# Patient Record
Sex: Male | Born: 1945 | Race: Asian | Hispanic: No | Marital: Married | Smoking: Never smoker
Health system: Southern US, Community
[De-identification: ages and names within clinical notes are randomized; demographics above are authoritative.]

## PROBLEM LIST (undated history)

## (undated) DIAGNOSIS — I669 Occlusion and stenosis of unspecified cerebral artery: Secondary | ICD-10-CM

## (undated) DIAGNOSIS — I639 Cerebral infarction, unspecified: Secondary | ICD-10-CM

## (undated) HISTORY — DX: Occlusion and stenosis of unspecified cerebral artery: I66.9

---

## 1978-04-09 HISTORY — PX: PROSTATECTOMY: SHX69

## 2007-04-10 DIAGNOSIS — I669 Occlusion and stenosis of unspecified cerebral artery: Secondary | ICD-10-CM

## 2007-04-10 HISTORY — DX: Occlusion and stenosis of unspecified cerebral artery: I66.9

## 2015-02-09 ENCOUNTER — Ambulatory Visit: Payer: Self-pay | Admitting: Family Medicine

## 2015-02-21 ENCOUNTER — Ambulatory Visit (INDEPENDENT_AMBULATORY_CARE_PROVIDER_SITE_OTHER): Payer: Self-pay | Admitting: Family Medicine

## 2015-02-21 ENCOUNTER — Encounter: Payer: Self-pay | Admitting: Family Medicine

## 2015-02-21 VITALS — BP 120/80 | HR 73 | Temp 97.7°F | Resp 16 | Ht 70.0 in | Wt 160.0 lb

## 2015-02-21 DIAGNOSIS — Z23 Encounter for immunization: Secondary | ICD-10-CM

## 2015-02-21 DIAGNOSIS — Z7189 Other specified counseling: Secondary | ICD-10-CM

## 2015-02-21 DIAGNOSIS — Z7689 Persons encountering health services in other specified circumstances: Secondary | ICD-10-CM

## 2015-02-21 DIAGNOSIS — Z0000 Encounter for general adult medical examination without abnormal findings: Secondary | ICD-10-CM

## 2015-02-21 DIAGNOSIS — I6330 Cerebral infarction due to thrombosis of unspecified cerebral artery: Secondary | ICD-10-CM

## 2015-02-21 LAB — CBC WITH DIFFERENTIAL/PLATELET
Basophils Absolute: 0 10*3/uL (ref 0.0–0.1)
Basophils Relative: 0 % (ref 0–1)
Eosinophils Absolute: 0.2 10*3/uL (ref 0.0–0.7)
Eosinophils Relative: 3 % (ref 0–5)
HCT: 41.7 % (ref 39.0–52.0)
Hemoglobin: 13.6 g/dL (ref 13.0–17.0)
Lymphocytes Relative: 34 % (ref 12–46)
Lymphs Abs: 2.1 10*3/uL (ref 0.7–4.0)
MCH: 26.7 pg (ref 26.0–34.0)
MCHC: 32.6 g/dL (ref 30.0–36.0)
MCV: 81.9 fL (ref 78.0–100.0)
MPV: 9.2 fL (ref 8.6–12.4)
Monocytes Absolute: 0.5 10*3/uL (ref 0.1–1.0)
Monocytes Relative: 8 % (ref 3–12)
Neutro Abs: 3.4 10*3/uL (ref 1.7–7.7)
Neutrophils Relative %: 55 % (ref 43–77)
Platelets: 269 10*3/uL (ref 150–400)
RBC: 5.09 MIL/uL (ref 4.22–5.81)
RDW: 14 % (ref 11.5–15.5)
WBC: 6.1 10*3/uL (ref 4.0–10.5)

## 2015-02-21 LAB — COMPLETE METABOLIC PANEL WITH GFR
ALT: 10 U/L (ref 9–46)
AST: 15 U/L (ref 10–35)
Albumin: 4.2 g/dL (ref 3.6–5.1)
Alkaline Phosphatase: 56 U/L (ref 40–115)
BUN: 10 mg/dL (ref 7–25)
CO2: 25 mmol/L (ref 20–31)
Calcium: 9.5 mg/dL (ref 8.6–10.3)
Chloride: 100 mmol/L (ref 98–110)
Creat: 0.93 mg/dL (ref 0.70–1.25)
GFR, Est African American: >89 mL/min (ref >=60)
GFR, Est Non African American: 83 mL/min (ref >=60)
Glucose, Bld: 85 mg/dL (ref 65–99)
Potassium: 4.8 mmol/L (ref 3.5–5.3)
Sodium: 135 mmol/L (ref 135–146)
Total Bilirubin: 0.5 mg/dL (ref 0.2–1.2)
Total Protein: 7.4 g/dL (ref 6.1–8.1)

## 2015-02-21 LAB — LIPID PANEL
Cholesterol: 97 mg/dL — ABNORMAL LOW (ref 125–200)
HDL: 42 mg/dL (ref >=40)
LDL Cholesterol: 37 mg/dL (ref <130)
Total CHOL/HDL Ratio: 2.3 Ratio (ref <=5.0)
Triglycerides: 92 mg/dL (ref <150)
VLDL: 18 mg/dL (ref <30)

## 2015-02-21 LAB — TSH: TSH: 1.638 u[IU]/mL (ref 0.350–4.500)

## 2015-02-21 NOTE — Progress Notes (Signed)
Patient ID: Christopher Murphy, male   DOB: 1945-10-09, 69 y.o.   MRN: [id number]   Christopher Murphy, is a 69 y.o. male  [id number]  [id number]  DOB - Jun 12, 1945  CC:  Chief Complaint  Patient presents with  . Establish Care       HPI: Christopher Murphy is a 69 y.o. male here to establish care. He has recently moved here from [place]. 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 He is currently on ASA 81 and Plavik 75 mg daily. He has no residual effects from the CVA according to the daughter. He is on namenda for memory, which his daughter relates as being related to the CVA. He is on Crestor but unsure if he has a history of hyperlipidemia. He also takes OTC folic acid. He does not need refills of any medications today, as he brought a good supply from [place]. He denies tobacco, alcohol or illicit drug use. His daughter says he walks a lot without difficulty. It is unclear about the quality of his diet. He has no history of hypertension, diabetes or cancer. He is in need for influenza, Tdap, pneumonia and colon cancer screening.   No Known Allergies Past Medical History  Diagnosis Date  . Blood clots in brain 2009   No current outpatient prescriptions on file prior to visit.   No current facility-administered medications on file prior to visit.   Family History  Problem Relation Age of Onset  . Family history unknown: Yes   Social History   Social History  . Marital Status: Married    Spouse Name: N/A  . Number of Children: N/A  . Years of Education: N/A   Occupational History  . Not on file.   Social History Main Topics  . Smoking status: Never Smoker   . Smokeless tobacco: Never Used  . Alcohol Use: No  . Drug Use: No  . Sexual Activity: Not on file   Other Topics Concern  . Not on file   Social History Narrative  . No narrative on file    Review of Systems: Constitutional: Negative for fever, chills, appetite change, weight loss,   Fatigue. Skin: Negative for rashes or lesions of concern. HENT: Negative for ear pain, ear discharge.nose bleeds Eyes: Negative for pain, discharge, redness, itching and visual disturbance. Neck: Negative for pain, stiffness Respiratory: Negative for cough, shortness of breath,   Cardiovascular: Negative for chest pain, palpitations and leg swelling. Gastrointestinal: Negative for abdominal pain, nausea, vomiting, diarrhea, constipations Genitourinary: Negative for dysuria, urgency, frequency, hematuria,  Musculoskeletal: Negative for back pain, joint pain, joint  swelling, and gait problem.Negative for weakness. Neurological: Negative for dizziness, tremors, seizures, syncope,   light-headedness, numbness and headaches.  Hematological: Negative for easy bruising or bleeding Psychiatric/Behavioral: Negative for depression, anxiety, decreased concentration, confusion. 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 No distress. HENT: Normocephalic, atraumatic, External right and left ear normal. Oropharynx is clear and moist.  Eyes: Conjunctivae and EOM are normal. PERRLA, no scleral icterus. Neck: Normal ROM. Neck supple. No lymphadenopathy, No thyromegaly. CVS: RRR, S1/S2 +, no murmurs, no gallops, no rubs Pulmonary: Effort and breath sounds normal, no stridor, rhonchi, wheezes, rales.  Abdominal: Soft. Normoactive BS,, no distension, tenderness, rebound or guarding.  Musculoskeletal: Normal range of motion. No edema and no tenderness.  Neuro: Alert.Normal muscle tone coordination. Non-focal. PERL, EOMS intact.moves all extrementies exhibiting equal strength. Very slight instability of gait. Gets on and off exam table without help.  Skin: Skin is warm and dry. No rash noted. Not diaphoretic. No  erythema. No pallor. Psychiatric: Normal mood and affect. Behavior, judgment, thought content normal.  No results found for: WBC, HGB, HCT, MCV, PLT No results found for: CREATININE, BUN, NA, K, CL, CO2  No results found for: HGBA1C Lipid Panel  No results found for: CHOL, TRIG, HDL, CHOLHDL, VLDL, LDLCALC     Assessment and plan:   1. Visit to establish care -I have reviewed information provided by the patient with the help of his daughter. -There is a minor language barrier, even with help of daughter but she expresses good understanding.  2.Health maintenance -CMet with GFR -CBC -Lipid Panel -Vitamin D -PSA -HIV -Will call with results if action needed.  3. Need for immunizations - Tdap -Prevnar -Influenza   Return in about 6 months (around 08/21/2015).  The patient was given clear instructions to go to ER or return to medical center if symptoms don't improve, worsen or new problems develop. The patient verbalized understanding.    Micheline Chapman FNP  02/22/2015, 10:56 AM

## 2015-02-21 NOTE — Patient Instructions (Signed)
Continue to take current medications as prescribed. Eat a diet that is low in fats and cholesterol and carbohydrates  We will call if there is anything in bloodwork that needs attention.

## 2015-02-22 LAB — HIV ANTIBODY (ROUTINE TESTING W REFLEX): HIV 1&2 Ab, 4th Generation: NONREACTIVE

## 2015-02-22 LAB — PSA: PSA: 1.64 ng/mL (ref <=4.00)

## 2015-02-23 LAB — VITAMIN D 1,25 DIHYDROXY
Vitamin D 1, 25 (OH)2 Total: 45 pg/mL (ref 18–72)
Vitamin D2 1, 25 (OH)2: <8 pg/mL
Vitamin D3 1, 25 (OH)2: 45 pg/mL

## 2015-08-22 ENCOUNTER — Ambulatory Visit: Payer: Self-pay | Admitting: Family Medicine

## 2016-04-05 ENCOUNTER — Emergency Department: Payer: BLUE CROSS/BLUE SHIELD

## 2016-04-05 ENCOUNTER — Observation Stay
Admission: EM | Admit: 2016-04-05 | Discharge: 2016-04-06 | Disposition: A | Discharge status: Home / Self Care | Payer: BLUE CROSS/BLUE SHIELD | Attending: Internal Medicine | Admitting: Internal Medicine

## 2016-04-05 ENCOUNTER — Encounter: Payer: Self-pay | Admitting: Emergency Medicine

## 2016-04-05 DIAGNOSIS — Z86718 Personal history of other venous thrombosis and embolism: Secondary | ICD-10-CM | POA: Insufficient documentation

## 2016-04-05 DIAGNOSIS — Z7902 Long term (current) use of antithrombotics/antiplatelets: Secondary | ICD-10-CM | POA: Insufficient documentation

## 2016-04-05 DIAGNOSIS — I639 Cerebral infarction, unspecified: Secondary | ICD-10-CM | POA: Diagnosis not present

## 2016-04-05 DIAGNOSIS — G459 Transient cerebral ischemic attack, unspecified: Principal | ICD-10-CM | POA: Insufficient documentation

## 2016-04-05 DIAGNOSIS — R000 Tachycardia, unspecified: Secondary | ICD-10-CM | POA: Insufficient documentation

## 2016-04-05 DIAGNOSIS — Z7982 Long term (current) use of aspirin: Secondary | ICD-10-CM | POA: Insufficient documentation

## 2016-04-05 DIAGNOSIS — R531 Weakness: Secondary | ICD-10-CM

## 2016-04-05 HISTORY — DX: Cerebral infarction, unspecified: I63.9

## 2016-04-05 LAB — DIFFERENTIAL
Basophils Absolute: 0.3 10*3/uL — ABNORMAL HIGH (ref 0–0.1)
Basophils Relative: 2 %
Eosinophils Absolute: 0.2 10*3/uL (ref 0–0.7)
Eosinophils Relative: 1 %
Lymphocytes Relative: 5 %
Lymphs Abs: 0.8 10*3/uL — ABNORMAL LOW (ref 1.0–3.6)
Monocytes Absolute: 0.8 10*3/uL (ref 0.2–1.0)
Monocytes Relative: 5 %
Neutro Abs: 13.8 10*3/uL — ABNORMAL HIGH (ref 1.4–6.5)
Neutrophils Relative %: 87 %

## 2016-04-05 LAB — COMPREHENSIVE METABOLIC PANEL
ALT: 13 U/L — ABNORMAL LOW (ref 17–63)
AST: 22 U/L (ref 15–41)
Albumin: 4 g/dL (ref 3.5–5.0)
Alkaline Phosphatase: 53 U/L (ref 38–126)
Anion gap: 9 (ref 5–15)
BUN: 14 mg/dL (ref 6–20)
CO2: 22 mmol/L (ref 22–32)
Calcium: 9 mg/dL (ref 8.9–10.3)
Chloride: 102 mmol/L (ref 101–111)
Creatinine, Ser: 1.02 mg/dL (ref 0.61–1.24)
GFR calc Af Amer: >60 mL/min (ref >60)
GFR calc non Af Amer: >60 mL/min (ref >60)
Glucose, Bld: 101 mg/dL — ABNORMAL HIGH (ref 65–99)
Potassium: 4.2 mmol/L (ref 3.5–5.1)
Sodium: 133 mmol/L — ABNORMAL LOW (ref 135–145)
Total Bilirubin: 0.4 mg/dL (ref 0.3–1.2)
Total Protein: 7.7 g/dL (ref 6.5–8.1)

## 2016-04-05 LAB — CBC
HCT: 38.1 % — ABNORMAL LOW (ref 40.0–52.0)
Hemoglobin: 13.2 g/dL (ref 13.0–18.0)
MCH: 27.2 pg (ref 26.0–34.0)
MCHC: 34.5 g/dL (ref 32.0–36.0)
MCV: 78.8 fL — ABNORMAL LOW (ref 80.0–100.0)
Platelets: 289 10*3/uL (ref 150–440)
RBC: 4.84 MIL/uL (ref 4.40–5.90)
RDW: 13.4 % (ref 11.5–14.5)
WBC: 15.9 10*3/uL — ABNORMAL HIGH (ref 3.8–10.6)

## 2016-04-05 LAB — TROPONIN I: Troponin I: 0.03 ng/mL (ref <0.03)

## 2016-04-05 LAB — GLUCOSE, CAPILLARY: Glucose-Capillary: 101 mg/dL — ABNORMAL HIGH (ref 65–99)

## 2016-04-05 LAB — PROTIME-INR
INR: 1.06
Prothrombin Time: 13.8 seconds (ref 11.4–15.2)

## 2016-04-05 LAB — APTT: aPTT: 31 seconds (ref 24–36)

## 2016-04-05 MED ORDER — ONDANSETRON HCL 4 MG PO TABS: 4.0000 mg | ORAL_TABLET | Freq: Four times a day (QID) | ORAL | Status: DC | PRN

## 2016-04-05 MED ORDER — ACETAMINOPHEN 650 MG RE SUPP: 650.0000 mg | Freq: Four times a day (QID) | RECTAL | Status: DC | PRN

## 2016-04-05 MED ORDER — MEMANTINE HCL 5 MG PO TABS
5.0000 mg | ORAL_TABLET | Freq: Two times a day (BID) | ORAL | Status: DC
  Administered 2016-04-05 – 2016-04-06 (×2): 5 mg via ORAL
  Filled 2016-04-05 (×3): qty 1

## 2016-04-05 MED ORDER — SODIUM CHLORIDE 0.9% FLUSH
3.0000 mL | Freq: Two times a day (BID) | INTRAVENOUS | Status: DC
  Administered 2016-04-05 – 2016-04-06 (×2): 3 mL via INTRAVENOUS

## 2016-04-05 MED ORDER — ENOXAPARIN SODIUM 40 MG/0.4ML ~~LOC~~ SOLN
40.0000 mg | SUBCUTANEOUS | Status: DC
  Administered 2016-04-05: 40 mg via SUBCUTANEOUS
  Filled 2016-04-05: qty 0.4

## 2016-04-05 MED ORDER — SODIUM CHLORIDE 0.9 % IV BOLUS (SEPSIS)
1000.0000 mL | Freq: Once | INTRAVENOUS | Status: AC
  Administered 2016-04-05: 1000 mL via INTRAVENOUS

## 2016-04-05 MED ORDER — POLYETHYLENE GLYCOL 3350 17 G PO PACK: 17.0000 g | PACK | Freq: Every day | ORAL | Status: DC | PRN

## 2016-04-05 MED ORDER — ACETAMINOPHEN 325 MG PO TABS: 650.0000 mg | ORAL_TABLET | Freq: Four times a day (QID) | ORAL | Status: DC | PRN

## 2016-04-05 MED ORDER — ONDANSETRON HCL 4 MG/2ML IJ SOLN: 4.0000 mg | Freq: Four times a day (QID) | INTRAMUSCULAR | Status: DC | PRN

## 2016-04-05 MED ORDER — SODIUM CHLORIDE 0.9% FLUSH
3.0000 mL | Freq: Two times a day (BID) | INTRAVENOUS | Status: DC
  Administered 2016-04-05 – 2016-04-06 (×2): 3 mL via INTRAVENOUS

## 2016-04-05 MED ORDER — CLOPIDOGREL BISULFATE 75 MG PO TABS
75.0000 mg | ORAL_TABLET | Freq: Every day | ORAL | Status: DC
  Administered 2016-04-06: 75 mg via ORAL
  Filled 2016-04-05: qty 1

## 2016-04-05 MED ORDER — SODIUM CHLORIDE 0.9% FLUSH: 3.0000 mL | INTRAVENOUS | Status: DC | PRN

## 2016-04-05 MED ORDER — IOPAMIDOL (ISOVUE-370) INJECTION 76%
75.0000 mL | Freq: Once | INTRAVENOUS | Status: AC | PRN
  Administered 2016-04-05: 75 mL via INTRAVENOUS

## 2016-04-05 MED ORDER — ASPIRIN EC 81 MG PO TBEC
81.0000 mg | DELAYED_RELEASE_TABLET | Freq: Every day | ORAL | Status: DC
  Administered 2016-04-06: 11:00:00 81 mg via ORAL
  Filled 2016-04-05: qty 1

## 2016-04-05 MED ORDER — SODIUM CHLORIDE 0.9 % IV SOLN: 250.0000 mL | INTRAVENOUS | Status: DC | PRN

## 2016-04-05 MED ORDER — INFLUENZA VAC SPLIT QUAD 0.5 ML IM SUSY
0.5000 mL | PREFILLED_SYRINGE | INTRAMUSCULAR | Status: AC
  Administered 2016-04-06: 11:00:00 0.5 mL via INTRAMUSCULAR
  Filled 2016-04-05: qty 0.5

## 2016-04-05 MED ORDER — ROSUVASTATIN CALCIUM 20 MG PO TABS
20.0000 mg | ORAL_TABLET | Freq: Every day | ORAL | Status: DC
  Administered 2016-04-05: 19:00:00 20 mg via ORAL
  Filled 2016-04-05: qty 1

## 2016-04-05 NOTE — H&P (Addendum)
[facility] NAME: Christopher Murphy    MR#:  [id number]  DATE OF BIRTH:  1945/10/21  DATE OF ADMISSION:  04/05/2016  PRIMARY CARE PHYSICIAN: No PCP Per Patient   REQUESTING/REFERRING PHYSICIAN: Dr. Darl Householder  CHIEF COMPLAINT:   Chief Complaint  Patient presents with  . Code Stroke    HISTORY OF PRESENT ILLNESS:  Christopher Murphy  is a 70 y.o. 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 Patient does not remember these episodes and tells me he is here for a general checkup. Presently patient is back to baseline. Symptoms started with patient having shaking all over without incontinence or tongue biting. He was then noticed to have right-sided weakness and speaking gibberish. By the time patient was brought to the coronary family had to drag him in due to the significant right-sided weakness. Code stroke was called. Consideration for TPA was given but symptoms improved quickly and now patient has no further weakness. CT scan of the head showed nothing acute. CTA showed chronic left PCA occlusion with no other significant findings. Patient is being admitted for TIA. H/o cataract surgery.  PAST MEDICAL HISTORY:   Past Medical History:  Diagnosis Date  . Blood clots in brain 2009  . Stroke [facility])     PAST SURGICAL HISTORY:  No past surgical history on file.  SOCIAL HISTORY:   Social History  Substance Use Topics  . Smoking status: Never Smoker  . Smokeless tobacco: Never Used  . Alcohol use No    FAMILY HISTORY:   Family History  Problem Relation Age of Onset  . Family history unknown: Yes    DRUG ALLERGIES:  No Known Allergies  REVIEW OF SYSTEMS:   Review of Systems  Constitutional: Negative for chills and fever.  HENT: Negative for sore throat.   Eyes:  Negative for blurred vision, double vision and pain.  Respiratory: Negative for cough, hemoptysis, shortness of breath and wheezing.   Cardiovascular: Negative for chest pain, palpitations, orthopnea and leg swelling.  Gastrointestinal: Negative for abdominal pain, constipation, diarrhea, heartburn, nausea and vomiting.  Genitourinary: Negative for dysuria and hematuria.  Musculoskeletal: Negative for back pain and joint pain.  Skin: Negative for rash.  Neurological: Negative for sensory change, speech change, focal weakness and headaches.  Endo/Heme/Allergies: Does not bruise/bleed easily.  Psychiatric/Behavioral: Negative for depression. The patient is not nervous/anxious.    MEDICATIONS AT HOME:   Prior to Admission medications   Medication Sig Start Date End Date Taking? Authorizing Provider  clopidogrel (PLAVIX) 75 MG tablet Take 75 mg by mouth daily.   Yes Historical Provider, MD  Folic Acid 5 MG CAPS Take 5 mg by mouth daily.    Yes Historical Provider, MD  memantine (NAMENDA) 5 MG tablet Take 5 mg by mouth 2 (two) times daily.   Yes Historical Provider, MD  rosuvastatin (CRESTOR) 20 MG tablet Take 20 mg by mouth daily.   Yes Historical Provider, MD   VITAL SIGNS:  Blood pressure 103/64, pulse 96, temperature 99.6 F (37.6 C), temperature source Oral, resp. rate (!) 22, SpO2 98 %.  PHYSICAL EXAMINATION:  Physical Exam  GENERAL:  70 y.o.-year-old patient lying in the bed with no acute distress.  EYES: Pupils unequal. Right 1 mm and left irregular and 4 mm. Extraocular muscles intact.  HEENT: Head atraumatic, normocephalic. Oropharynx and nasopharynx clear. No oropharyngeal erythema, moist oral mucosa  NECK:  Supple, no jugular venous distention. No thyroid enlargement, no tenderness.  LUNGS: Normal breath sounds bilaterally, no wheezing, rales, rhonchi. No use of accessory muscles of respiration.  CARDIOVASCULAR: S1, S2 normal. No murmurs, rubs, or gallops.  ABDOMEN: Soft,  nontender, nondistended. Bowel sounds present. No organomegaly or mass.  EXTREMITIES: No pedal edema, cyanosis, or clubbing. + 2 pedal & radial pulses b/l.   NEUROLOGIC: Cranial nerves II through XII are intact. No focal Motor or sensory deficits appreciated b/l PSYCHIATRIC: The patient is alert and oriented x 3. Good affect.  SKIN: No obvious rash, lesion, or ulcer.   LABORATORY PANEL:   CBC  Recent Labs Lab 04/05/16 1501  WBC 15.9*  HGB 13.2  HCT 38.1*  PLT 289   ------------------------------------------------------------------------------------------------------------------  Chemistries   Recent Labs Lab 04/05/16 1501  NA 133*  K 4.2  CL 102  CO2 22  GLUCOSE 101*  BUN 14  CREATININE 1.02  CALCIUM 9.0  AST 22  ALT 13*  ALKPHOS 53  BILITOT 0.4   ------------------------------------------------------------------------------------------------------------------  Cardiac Enzymes  Recent Labs Lab 04/05/16 1501  TROPONINI 0.03*   ------------------------------------------------------------------------------------------------------------------  RADIOLOGY:  Ct Angio Head W Or Wo Contrast  Result Date: 04/05/2016 CLINICAL DATA:  Code stroke. RIGHT-sided weakness. Uncontrollable body movements. EXAM: CT ANGIOGRAPHY HEAD AND NECK TECHNIQUE: Multidetector CT imaging of the head and neck was performed using the standard protocol during bolus administration of intravenous contrast. Multiplanar CT image reconstructions and MIPs were obtained to evaluate the vascular anatomy. Carotid stenosis measurements (when applicable) are obtained utilizing NASCET criteria, using the distal internal carotid diameter as the denominator. CONTRAST:  Isovue 370, 75 mL. COMPARISON:  Code stroke CT head at 1456 hours. FINDINGS: CT HEAD Please see the code stroke head CT report performed 30 minutes earlier. CTA NECK Aortic arch: Standard branching. Imaged portion shows no evidence of aneurysm or  dissection. No significant stenosis of the major arch vessel origins. Right carotid system: Unremarkable. No evidence of dissection, stenosis (50% or greater) or occlusion. Left carotid system: Unremarkable. No evidence of dissection, stenosis (50% or greater) or occlusion. Vertebral arteries: LEFT vertebral slightly larger, both patent. No evidence of dissection, stenosis (50% or greater) or occlusion. Nonvascular soft tissues: No neck masses or significant spondylosis. No lung apex lesion CTA HEAD Anterior circulation: Dolichoectasia. Minor cavernous atheromatous change on the LEFT. No significant stenosis, proximal occlusion, aneurysm, or vascular malformation. Posterior circulation: Dolichoectasia. LEFT vertebral dominant. 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 No significant stenosis, acute proximal occlusion, aneurysm, or vascular malformation. Venous sinuses: As permitted by contrast timing, patent. Anatomic variants: None of significance. Delayed phase:   No abnormal intracranial enhancement. IMPRESSION: No extracranial stenosis of significance. No acute intracranial vascular occlusion or abnormal postcontrast enhancement. Chronically occluded LEFT PCA. Electronically Signed   By: Staci Righter M.D.   On: 04/05/2016 16:07   Ct Angio Neck W Or Wo Contrast  Result Date: 04/05/2016 CLINICAL DATA:  Code stroke. RIGHT-sided weakness. Uncontrollable body movements. EXAM: CT ANGIOGRAPHY HEAD AND NECK TECHNIQUE: Multidetector CT imaging of the head and neck was performed using the standard protocol during bolus administration of intravenous contrast. Multiplanar CT image reconstructions and MIPs were obtained to evaluate the vascular anatomy. Carotid stenosis measurements (when applicable) are obtained utilizing NASCET criteria, using the distal internal carotid diameter as the denominator. CONTRAST:  Isovue 370, 75 mL. COMPARISON:  Code stroke CT head at 1456  hours. FINDINGS: CT HEAD Please see the code stroke head CT report performed 30 minutes earlier. CTA NECK Aortic arch: Standard branching. Imaged portion shows no evidence of aneurysm or dissection. No significant stenosis of the major arch vessel origins. Right carotid system: Unremarkable. No evidence of dissection, stenosis (50% or greater) or occlusion. Left carotid system: Unremarkable. No evidence of dissection, stenosis (50% or greater) or occlusion. Vertebral arteries: LEFT vertebral slightly larger, both patent. No evidence of dissection, stenosis (50% or greater) or occlusion. Nonvascular soft tissues: No neck masses or significant spondylosis. No lung apex lesion CTA HEAD Anterior circulation: Dolichoectasia. Minor cavernous atheromatous change on the LEFT. No significant stenosis, proximal occlusion, aneurysm, or vascular malformation. Posterior circulation: Dolichoectasia. LEFT vertebral dominant. 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 No significant stenosis, acute proximal occlusion, aneurysm, or vascular malformation. Venous sinuses: As permitted by contrast timing, patent. Anatomic variants: None of significance. Delayed phase:   No abnormal intracranial enhancement. IMPRESSION: No extracranial stenosis of significance. No acute intracranial vascular occlusion or abnormal postcontrast enhancement. Chronically occluded LEFT PCA. Electronically Signed   By: Staci Righter M.D.   On: 04/05/2016 16:07   Dg Chest Portable 1 View  Result Date: 04/05/2016 CLINICAL DATA:  Right-sided weakness, confusion. EXAM: PORTABLE CHEST 1 VIEW COMPARISON:  None. FINDINGS: The heart size and mediastinal contours are within normal limits. Both lungs are clear. No pneumothorax or pleural effusion is noted. The visualized skeletal structures are unremarkable. IMPRESSION: No acute cardiopulmonary abnormality seen. Electronically Signed   By: Marijo Conception, M.D.   On:  04/05/2016 16:11   Ct Head Code Stroke W/o Cm  Addendum Date: 04/05/2016   ADDENDUM REPORT: 04/05/2016 15:37 ADDENDUM: Study discussed by telephone With Dr. Alexis Goodell on 04/05/2016 at 1518 hours. Electronically Signed   By: Genevie Ann M.D.   On: 04/05/2016 15:37   Result Date: 04/05/2016 CLINICAL DATA:  Code stroke. 70 year old male with right side weakness. Uncontrollable movements and jerking. Initial encounter. EXAM: CT HEAD WITHOUT CONTRAST TECHNIQUE: Contiguous axial images were obtained from the base of the skull through the vertex without intravenous contrast. COMPARISON:  None. FINDINGS: Brain: Confluent abnormal bilateral cerebral white matter hypodensity. Superimposed chronic infarcts of the left thalamus and left occipital lobe. Wallerian degeneration evident at the left cerebral peduncle and midbrain (series 2, image 14). No other cortical encephalomalacia identified. The remaining brainstem and cerebellum appear within normal limits. No midline shift, ventriculomegaly, mass effect, evidence of mass lesion, intracranial hemorrhage or evidence of cortically based acute infarction. Vascular: Mild Calcified atherosclerosis at the skull base. Generalized intracranial artery dolichoectasia. No suspicious intracranial vascular hyperdensity. Skull: Negative.  No acute osseous abnormality identified. Sinuses/Orbits: Mild bilateral ethmoid and maxillary sinus mucosal thickening. Other visible sinuses, the tympanic cavities and mastoids are well pneumatized. Other: No acute orbit or scalp soft tissue findings. Postoperative changes to the left globe. ASPECTS [facility] Stroke Program Early CT Score) - Ganglionic level infarction (caudate, lentiform nuclei, internal capsule, insula, M1-M3 cortex): 7 - Supraganglionic infarction (M4-M6 cortex): 3 Total score (0-10 with 10 being normal): 10 IMPRESSION: 1. No acute cortically based infarct or acute intracranial hemorrhage identified. 2. ASPECTS is 10. 3.  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 Left midbrain Wallerian degeneration. Confluent bilateral cerebral white matter hypodensity likely due to superimposed chronic small vessel disease. 4. 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 Electronically Signed:  By: Genevie Ann M.D. On: 04/05/2016 15:11     IMPRESSION AND PLAN:   * TIA -Check MRI of the brain, Carotid dopplers, Echo - Start aspirin and statin. - Lovenox for DVT prophylaxis. - PT/OT/Speech not needed as per symptoms - Neuro checks every 4 hours for 24 hours. - Consult neurology.  All the records are reviewed and case discussed with ED provider. Management plans discussed with the patient, family and they are in agreement.  CODE STATUS: FULL CODE  TOTAL TIME TAKING CARE OF THIS PATIENT: 40 minutes.   Hillary Bow R M.D on 04/05/2016 at 4:54 PM  Between 7am to 6pm - Pager - [phone]  After 6pm go to [email or web address] - password [email or web address] [facility]  Office  [phone]  CC: Primary care physician; No PCP Per Patient  Note: This dictation was prepared with Dragon dictation along with smaller phrase technology. Any transcriptional errors that result from this process are unintentional.

## 2016-04-05 NOTE — ED Notes (Signed)
Dr Doy Mince in with pt and family.

## 2016-04-05 NOTE — ED Notes (Signed)
No treatment at this time, pt is improving, keep in treatment window until 1600, VS Q15, neuro Q30, then Q2h.  Primary nurse Mateo Flow, RN updated.

## 2016-04-05 NOTE — ED Notes (Signed)
Pt alert.  Iv fluids infsued.  Family with pt.  nsr on monitor.

## 2016-04-05 NOTE — ED Notes (Signed)
Dr reynolds at bedside.  

## 2016-04-05 NOTE — ED Notes (Signed)
Lab called with troponin of 0.03   Dr Darl Householder aware.

## 2016-04-05 NOTE — ED Notes (Signed)
Report  Called to floor nurse mattie rn

## 2016-04-05 NOTE — Progress Notes (Signed)
Upon assessment, pt's left pupil is about 4 mm, non reactive, and uneven. Pt's right pupil is about 20mm, non reactive and uneven. MD paged, stated he was aware. Ammie Dalton, RN

## 2016-04-05 NOTE — ED Notes (Signed)
Patient transported to CT 

## 2016-04-05 NOTE — Progress Notes (Signed)
Chaplain received and order to visit with pt in room 116. Provided information regarding an Scientist, physiological.    04/05/16 1815  Clinical Encounter Type  Visited With Patient;Patient and family together  Visit Type Initial  Referral From Nurse  Consult/Referral To Chaplain  Spiritual Encounters  Spiritual Needs Other (Comment)

## 2016-04-05 NOTE — ED Notes (Signed)
Son reports at 66 pt started shivering and then had difficulty speaking and right sided weakness. Has been confused.

## 2016-04-05 NOTE — Consult Note (Signed)
Referring Physician: Darl Householder    Chief Complaint: Difficulty with speech, right sided weakness  HPI: Christopher Murphy is an 70 y.o. male with a history of stroke who was with family today and expressed that he was feeling poorly.  They noted that he was unable to move his right side and that he was talking "jibberish".  Knows English as a second language but was even having difficulty with his first language.  Patient was brought in for evaluation at that time.  Patient has a history of a left PCA infarct about 8 years ago.  Was most recently followed up in [place].  On Plavix?    Date last known well: Date: 04/05/2016 Time last known well: Time: 11:30 tPA Given: No: Symptoms improving, unclear medical regimen.    Past Medical History:  Diagnosis Date  . Blood clots in brain 2009  . Stroke [facility])     No past surgical history on file.  Family History  Problem Relation Age of Onset  . Family history unknown: Yes   Social History:  reports that he has never smoked. He has never used smokeless tobacco. He reports that he does not drink alcohol or use drugs.  Allergies: No Known Allergies  Medications: I have reviewed the patient's current medications. Prior to Admission:  List of medications given but not in [place].  Felt to be on Plavix.    ROS: History obtained from caregiver  General ROS: negative for - chills, fatigue, fever, night sweats, weight gain or weight loss Psychological ROS: negative for - behavioral disorder, hallucinations, memory difficulties, mood swings or suicidal ideation Ophthalmic ROS: negative for - blurry vision, double vision, eye pain or loss of vision ENT ROS: negative for - epistaxis, nasal discharge, oral lesions, sore throat, tinnitus or vertigo Allergy and Immunology ROS: negative for - hives or itchy/watery eyes Hematological and Lymphatic ROS: negative for - bleeding problems, bruising or swollen lymph nodes Endocrine ROS: negative for -  galactorrhea, hair pattern changes, polydipsia/polyuria or temperature intolerance Respiratory ROS: negative for - cough, hemoptysis, shortness of breath or wheezing Cardiovascular ROS: negative for - chest pain, dyspnea on exertion, edema or irregular heartbeat Gastrointestinal ROS: negative for - abdominal pain, diarrhea, hematemesis, nausea/vomiting or stool incontinence Genito-Urinary ROS: negative for - dysuria, hematuria, incontinence or urinary frequency/urgency Musculoskeletal ROS: negative for - joint swelling or muscular weakness Neurological ROS: as noted in HPI Dermatological ROS: negative for rash and skin lesion changes  Physical Examination: Blood pressure 96/71, pulse (!) 112, temperature 99.6 F (37.6 C), temperature source Oral, resp. rate 17, SpO2 97 %.  HEENT-  Normocephalic, no lesions, without obvious abnormality.  Normal external eye and conjunctiva.  Normal TM's bilaterally.  Normal auditory canals and external ears. Normal external nose, mucus membranes and septum.  Normal pharynx. Cardiovascular- S1, S2 normal, pulses palpable throughout   Lungs- chest clear, no wheezing, rales, normal symmetric air entry Abdomen- soft, non-tender; bowel sounds normal; no masses,  no organomegaly Extremities- no edema Lymph-no adenopathy palpable Musculoskeletal-no joint tenderness, deformity or swelling Skin-warm and dry, no hyperpigmentation, vitiligo, or suspicious lesions  Neurological Examination Mental Status: Alert.  Reports that it is August and that he is 32.  Is able to follow commands-speaking some English and some of his native language.   Cranial Nerves: II: Discs flat bilaterally; Visual fields grossly normal, pupils equal, round, reactive to light and accommodation III,IV, VI: ptosis not present, extra-ocular motions intact bilaterally V,VII: smile symmetric, facial light touch sensation normal bilaterally  VIII: hearing normal bilaterally IX,X: gag reflex  present XI: bilateral shoulder shrug XII: midline tongue extension Motor: Right : Upper extremity   5/5    Left:     Upper extremity   5/5  Lower extremity   5-/5     Lower extremity   5-/5 Tone and bulk:normal tone throughout; no atrophy noted Sensory: Pinprick and light touch intact throughout, bilaterally Deep Tendon Reflexes: 2+ and symmetric throughout Plantars: Right: downgoing   Left: downgoing Cerebellar: Normal finger-to-nose and normal heel-to-shin testing bilaterally Gait: not tested due to safety concerns    Laboratory Studies:  Basic Metabolic Panel: No results for input(s): NA, K, CL, CO2, GLUCOSE, BUN, CREATININE, CALCIUM, MG, PHOS in the last 168 hours.  Liver Function Tests: No results for input(s): AST, ALT, ALKPHOS, BILITOT, PROT, ALBUMIN in the last 168 hours. No results for input(s): LIPASE, AMYLASE in the last 168 hours. No results for input(s): AMMONIA in the last 168 hours.  CBC:  Recent Labs Lab 04/05/16 1501  WBC 15.9*  HGB 13.2  HCT 38.1*  MCV 78.8*  PLT 289    Cardiac Enzymes: No results for input(s): CKTOTAL, CKMB, CKMBINDEX, TROPONINI in the last 168 hours.  BNP: Invalid input(s): POCBNP  CBG:  Recent Labs Lab 04/05/16 [place]*    Microbiology: No results found for this or any previous visit.  Coagulation Studies:  Recent Labs  04/05/16 1501  LABPROT 13.8  INR 1.06    Urinalysis: No results for input(s): COLORURINE, LABSPEC, PHURINE, GLUCOSEU, HGBUR, BILIRUBINUR, KETONESUR, PROTEINUR, UROBILINOGEN, NITRITE, LEUKOCYTESUR in the last 168 hours.  Invalid input(s): APPERANCEUR  Lipid Panel:    Component Value Date/Time   CHOL 97 (L) 02/21/2015 1008   TRIG 92 02/21/2015 1008   HDL 42 02/21/2015 1008   CHOLHDL 2.3 02/21/2015 1008   VLDL 18 02/21/2015 1008   LDLCALC 37 02/21/2015 1008    HgbA1C: No results found for: HGBA1C  Urine Drug Screen:  No results found for: LABOPIA, COCAINSCRNUR, LABBENZ,  AMPHETMU, THCU, LABBARB  Alcohol Level: No results for input(s): ETH in the last 168 hours.  Other results: EKG: sinus tachycardia at 113 bpm.  Imaging: Ct Head Code Stroke W/o Cm  Result Date: 04/05/2016 CLINICAL DATA:  Code stroke. 70 year old male with right side weakness. Uncontrollable movements and jerking. Initial encounter. EXAM: CT HEAD WITHOUT CONTRAST TECHNIQUE: Contiguous axial images were obtained from the base of the skull through the vertex without intravenous contrast. COMPARISON:  None. FINDINGS: Brain: Confluent abnormal bilateral cerebral white matter hypodensity. Superimposed chronic infarcts of the left thalamus and left occipital lobe. Wallerian degeneration evident at the left cerebral peduncle and midbrain (series 2, image 14). No other cortical encephalomalacia identified. The remaining brainstem and cerebellum appear within normal limits. No midline shift, ventriculomegaly, mass effect, evidence of mass lesion, intracranial hemorrhage or evidence of cortically based acute infarction. Vascular: Mild Calcified atherosclerosis at the skull base. Generalized intracranial artery dolichoectasia. No suspicious intracranial vascular hyperdensity. Skull: Negative.  No acute osseous abnormality identified. Sinuses/Orbits: Mild bilateral ethmoid and maxillary sinus mucosal thickening. Other visible sinuses, the tympanic cavities and mastoids are well pneumatized. Other: No acute orbit or scalp soft tissue findings. Postoperative changes to the left globe. ASPECTS [facility] Stroke Program Early CT Score) - Ganglionic level infarction (caudate, lentiform nuclei, internal capsule, insula, M1-M3 cortex): 7 - Supraganglionic infarction (M4-M6 cortex): 3 Total score (0-10 with 10 being normal): 10 IMPRESSION: 1. No acute cortically based infarct or acute intracranial hemorrhage identified.  2. ASPECTS is 10. 3. Advanced chronic ischemic disease in the left PCA territory, including involvement of the  left thalamus. Left midbrain Wallerian degeneration. Confluent bilateral cerebral white matter hypodensity likely due to superimposed chronic small vessel disease. 4. 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 Electronically Signed   By: Genevie Ann M.D.   On: 04/05/2016 15:11    Assessment: 70 y.o. male presenting with right sided weakness and difficulty with speech that is improving.  Head CT reviewed and shows no acute changes but significant evidence of chronic small vessel disease.  Although patient nearing baseline, not quite at baseline.  Will rule out ICA occlusion, near occlusion that would change management.    Stroke Risk Factors - past history of stroke  Plan: 1. HgbA1c, fasting lipid panel 2. MRI of the brain without contrast 3. PT consult, OT consult, Speech consult 4. Echocardiogram 5. Carotid dopplers 6. CTA of head and neck STAT 7. NPO until RN stroke swallow screen 8. Telemetry monitoring 9. Frequent neuro checks 10. Plavix 75mg  and ASA 81mg  daily  Case discussed with Dr. Dene Gentry, MD Neurology [phone] 04/05/2016, 3:23 PM  Addendum: CTA shows a chronically occluded left PCA but no large vessel occlusion.  Patient to be admitted for further evaluation.    Alexis Goodell, MD Neurology [phone]

## 2016-04-05 NOTE — Progress Notes (Signed)
[facility] responded to a Code Stroke in ED 08. Pt was in CT scan on my arrival. Daughter-In-law was beside. Son was with Med team and Pt when they arrived back from CT. [place] provided the ministry of presence and hospitality. [place] refers to [facility] for further care.    04/05/16 1500  Clinical Encounter Type  Visited With Patient;Patient and family together;Health care provider  Visit Type Initial;Spiritual support;Code;ED;Other (Comment) (Code Stroke)  Referral From Nurse  Spiritual Encounters  Spiritual Needs Emotional

## 2016-04-05 NOTE — ED Triage Notes (Signed)
Pt having right sided weakness since 03-1229

## 2016-04-05 NOTE — ED Provider Notes (Signed)
[facility] Provider Note   CSN: [id number] Arrival date & time: 04/05/16  1447     History   Chief Complaint Chief Complaint  Patient presents with  . Code Stroke    HPI Christopher Murphy is a 70 y.o. male hx of previous ischemic stroke in 2009 with some right sided weakness, here with weakness, slurred speech. Acute onset around 11:30 AM, he had diffuse tremors and then subsequently he had some trouble speaking as per the son. Patient has slurred speech as well. As per the son, patient was trying to walk to the bathroom initially was able to but that had trouble with the right side. He noticed significant weakness on the right side and brought him into the ER for evaluation. Patient is on several medications from [place] and as per the son, patient is on a blood thinner.    The history is provided by the patient and a relative.    Past Medical History:  Diagnosis Date  . Blood clots in brain 2009  . Stroke [facility])     There are no active problems to display for this patient.   No past surgical history on file.     Home Medications    Prior to Admission medications   Medication Sig Start Date End Date Taking? Authorizing Provider  clopidogrel (PLAVIX) 75 MG tablet Take 75 mg by mouth daily.   Yes Historical Provider, MD  Folic Acid 5 MG CAPS Take 5 mg by mouth daily.    Yes Historical Provider, MD  memantine (NAMENDA) 5 MG tablet Take 5 mg by mouth 2 (two) times daily.   Yes Historical Provider, MD  rosuvastatin (CRESTOR) 20 MG tablet Take 20 mg by mouth daily.   Yes Historical Provider, MD    Family History Family History  Problem Relation Age of Onset  . Family history unknown: Yes    Social History Social History  Substance Use Topics  . Smoking status: Never Smoker  . Smokeless tobacco: Never Used  . Alcohol use No     Allergies   Patient has no known allergies.   Review of Systems Review of Systems  Neurological: Positive for  speech difficulty and weakness.  All other systems reviewed and are negative.    Physical Exam Updated Vital Signs BP 104/65   Pulse (!) 101   Temp 99.6 F (37.6 C) (Oral)   Resp 18   SpO2 99%   Physical Exam  Constitutional: He is oriented to person, place, and time.  Chronically ill,   HENT:  Head: Normocephalic.  ? R facial droop   Eyes: EOM are normal. Pupils are equal, round, and reactive to light.  Neck: Normal range of motion. Neck supple.  Cardiovascular: Regular rhythm and normal heart sounds.   Tachycardic   Pulmonary/Chest: Effort normal and breath sounds normal. No respiratory distress. He has no wheezes. He has no rales.  Abdominal: Soft. Bowel sounds are normal.  Musculoskeletal: Normal range of motion.  Neurological: He is alert and oriented to person, place, and time.  ? R facial droop. Nl strength bilateral arms. ? Some drift bilateral legs.   Skin: Skin is warm.  Psychiatric: He has a normal mood and affect.  Nursing note and vitals reviewed.    ED Treatments / Results  Labs (all labs ordered are listed, but only abnormal results are displayed) Labs Reviewed  CBC - Abnormal; Notable for the following:       Result Value   WBC  15.9 (*)    HCT 38.1 (*)    MCV 78.8 (*)    All other components within normal limits  DIFFERENTIAL - Abnormal; Notable for the following:    Neutro Abs 13.8 (*)    Lymphs Abs 0.8 (*)    Basophils Absolute 0.3 (*)    All other components within normal limits  COMPREHENSIVE METABOLIC PANEL - Abnormal; Notable for the following:    Sodium 133 (*)    Glucose, Bld 101 (*)    ALT 13 (*)    All other components within normal limits  TROPONIN I - Abnormal; Notable for the following:    Troponin I 0.03 (*)    All other components within normal limits  GLUCOSE, CAPILLARY - Abnormal; Notable for the following:    Glucose-Capillary 101 (*)    All other components within normal limits  PROTIME-INR  APTT  CBG MONITORING, ED     EKG  EKG Interpretation None      ED ECG REPORT I, Wandra Arthurs, the attending physician, personally viewed and interpreted this ECG.   Date: 04/05/2016  EKG Time: 15:14 pm  Rate: 113  Rhythm: sinus tachycardia  Axis: normal  Intervals:none  ST&T Change: nonspecific    Radiology Ct Angio Head W Or Wo Contrast  Result Date: 04/05/2016 CLINICAL DATA:  Code stroke. RIGHT-sided weakness. Uncontrollable body movements. EXAM: CT ANGIOGRAPHY HEAD AND NECK TECHNIQUE: Multidetector CT imaging of the head and neck was performed using the standard protocol during bolus administration of intravenous contrast. Multiplanar CT image reconstructions and MIPs were obtained to evaluate the vascular anatomy. Carotid stenosis measurements (when applicable) are obtained utilizing NASCET criteria, using the distal internal carotid diameter as the denominator. CONTRAST:  Isovue 370, 75 mL. COMPARISON:  Code stroke CT head at 1456 hours. FINDINGS: CT HEAD Please see the code stroke head CT report performed 30 minutes earlier. CTA NECK Aortic arch: Standard branching. Imaged portion shows no evidence of aneurysm or dissection. No significant stenosis of the major arch vessel origins. Right carotid system: Unremarkable. No evidence of dissection, stenosis (50% or greater) or occlusion. Left carotid system: Unremarkable. No evidence of dissection, stenosis (50% or greater) or occlusion. Vertebral arteries: LEFT vertebral slightly larger, both patent. No evidence of dissection, stenosis (50% or greater) or occlusion. Nonvascular soft tissues: No neck masses or significant spondylosis. No lung apex lesion CTA HEAD Anterior circulation: Dolichoectasia. Minor cavernous atheromatous change on the LEFT. No significant stenosis, proximal occlusion, aneurysm, or vascular malformation. Posterior circulation: Dolichoectasia. LEFT vertebral dominant. Chronically occluded LEFT PCA (large chronic LEFT occipital infarct) near its  origin. No significant stenosis, acute proximal occlusion, aneurysm, or vascular malformation. Venous sinuses: As permitted by contrast timing, patent. Anatomic variants: None of significance. Delayed phase:   No abnormal intracranial enhancement. IMPRESSION: No extracranial stenosis of significance. No acute intracranial vascular occlusion or abnormal postcontrast enhancement. Chronically occluded LEFT PCA. Electronically Signed   By: Staci Righter M.D.   On: 04/05/2016 16:07   Ct Angio Neck W Or Wo Contrast  Result Date: 04/05/2016 CLINICAL DATA:  Code stroke. RIGHT-sided weakness. Uncontrollable body movements. EXAM: CT ANGIOGRAPHY HEAD AND NECK TECHNIQUE: Multidetector CT imaging of the head and neck was performed using the standard protocol during bolus administration of intravenous contrast. Multiplanar CT image reconstructions and MIPs were obtained to evaluate the vascular anatomy. Carotid stenosis measurements (when applicable) are obtained utilizing NASCET criteria, using the distal internal carotid diameter as the denominator. CONTRAST:  Isovue 370, 75 mL.  COMPARISON:  Code stroke CT head at 1456 hours. FINDINGS: CT HEAD Please see the code stroke head CT report performed 30 minutes earlier. CTA NECK Aortic arch: Standard branching. Imaged portion shows no evidence of aneurysm or dissection. No significant stenosis of the major arch vessel origins. Right carotid system: Unremarkable. No evidence of dissection, stenosis (50% or greater) or occlusion. Left carotid system: Unremarkable. No evidence of dissection, stenosis (50% or greater) or occlusion. Vertebral arteries: LEFT vertebral slightly larger, both patent. No evidence of dissection, stenosis (50% or greater) or occlusion. Nonvascular soft tissues: No neck masses or significant spondylosis. No lung apex lesion CTA HEAD Anterior circulation: Dolichoectasia. Minor cavernous atheromatous change on the LEFT. No significant stenosis, proximal  occlusion, aneurysm, or vascular malformation. Posterior circulation: Dolichoectasia. LEFT vertebral dominant. 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 No significant stenosis, acute proximal occlusion, aneurysm, or vascular malformation. Venous sinuses: As permitted by contrast timing, patent. Anatomic variants: None of significance. Delayed phase:   No abnormal intracranial enhancement. IMPRESSION: No extracranial stenosis of significance. No acute intracranial vascular occlusion or abnormal postcontrast enhancement. Chronically occluded LEFT PCA. Electronically Signed   By: Staci Righter M.D.   On: 04/05/2016 16:07   Dg Chest Portable 1 View  Result Date: 04/05/2016 CLINICAL DATA:  Right-sided weakness, confusion. EXAM: PORTABLE CHEST 1 VIEW COMPARISON:  None. FINDINGS: The heart size and mediastinal contours are within normal limits. Both lungs are clear. No pneumothorax or pleural effusion is noted. The visualized skeletal structures are unremarkable. IMPRESSION: No acute cardiopulmonary abnormality seen. Electronically Signed   By: Marijo Conception, M.D.   On: 04/05/2016 16:11   Ct Head Code Stroke W/o Cm  Addendum Date: 04/05/2016   ADDENDUM REPORT: 04/05/2016 15:37 ADDENDUM: Study discussed by telephone With Dr. Alexis Goodell on 04/05/2016 at 1518 hours. Electronically Signed   By: Genevie Ann M.D.   On: 04/05/2016 15:37   Result Date: 04/05/2016 CLINICAL DATA:  Code stroke. 70 year old male with right side weakness. Uncontrollable movements and jerking. Initial encounter. EXAM: CT HEAD WITHOUT CONTRAST TECHNIQUE: Contiguous axial images were obtained from the base of the skull through the vertex without intravenous contrast. COMPARISON:  None. FINDINGS: Brain: Confluent abnormal bilateral cerebral white matter hypodensity. Superimposed chronic infarcts of the left thalamus and left occipital lobe. Wallerian degeneration evident at the left cerebral  peduncle and midbrain (series 2, image 14). No other cortical encephalomalacia identified. The remaining brainstem and cerebellum appear within normal limits. No midline shift, ventriculomegaly, mass effect, evidence of mass lesion, intracranial hemorrhage or evidence of cortically based acute infarction. Vascular: Mild Calcified atherosclerosis at the skull base. Generalized intracranial artery dolichoectasia. No suspicious intracranial vascular hyperdensity. Skull: Negative.  No acute osseous abnormality identified. Sinuses/Orbits: Mild bilateral ethmoid and maxillary sinus mucosal thickening. Other visible sinuses, the tympanic cavities and mastoids are well pneumatized. Other: No acute orbit or scalp soft tissue findings. Postoperative changes to the left globe. ASPECTS [facility] Stroke Program Early CT Score) - Ganglionic level infarction (caudate, lentiform nuclei, internal capsule, insula, M1-M3 cortex): 7 - Supraganglionic infarction (M4-M6 cortex): 3 Total score (0-10 with 10 being normal): 10 IMPRESSION: 1. No acute cortically based infarct or acute intracranial hemorrhage identified. 2. ASPECTS is 10. 3. 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 Left midbrain Wallerian degeneration. Confluent bilateral cerebral white matter hypodensity likely due to superimposed chronic small vessel disease. 4. 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 Electronically  Signed: By: Genevie Ann M.D. On: 04/05/2016 15:11    Procedures Procedures (including critical care time)  Medications Ordered in ED Medications  sodium chloride 0.9 % bolus 1,000 mL (1,000 mLs Intravenous New Bag/Given 04/05/16 1516)  iopamidol (ISOVUE-370) 76 % injection 75 mL (75 mLs Intravenous Contrast Given 04/05/16 1531)     Initial Impression / Assessment and Plan / ED Course  I have reviewed the triage vital signs and the nursing notes.  Pertinent labs  & imaging results that were available during my care of the patient were reviewed by me and considered in my medical decision making (see chart for details).  Clinical Course     Christopher Murphy is a 70 y.o. male here with slurred speech, weakness. NIH 5 on arrival. Code stroke activated in triage. Neuro at bedside. Will hold off on TPA since patient may be on blood thinners and only NIH of 5 and improving deficits. Will get labs, CT angio head/neck.   4:20 PM CTA showed no obvious arterial occlusion. Tachycardia improved with fluids. CXR clear. Hospitalist to admit for TIA workup as per Dr. Doy Mince recommendation.    Final Clinical Impressions(s) / ED Diagnoses   Final diagnoses:  Right sided weakness  Right sided weakness    New Prescriptions New Prescriptions   No medications on file     Drenda Freeze, MD 04/05/16 1620

## 2016-04-05 NOTE — ED Notes (Signed)
Pt began coughing immediately after drinking water.  Family with pt.  Pt alert.  No resp distress

## 2016-04-06 ENCOUNTER — Observation Stay
Admit: 2016-04-06 | Discharge: 2016-04-06 | Disposition: A | Discharge status: Home / Self Care | Payer: BLUE CROSS/BLUE SHIELD | Attending: Internal Medicine | Admitting: Internal Medicine

## 2016-04-06 ENCOUNTER — Observation Stay: Payer: BLUE CROSS/BLUE SHIELD

## 2016-04-06 DIAGNOSIS — G458 Other transient cerebral ischemic attacks and related syndromes: Secondary | ICD-10-CM | POA: Diagnosis not present

## 2016-04-06 LAB — LIPID PANEL
Cholesterol: 83 mg/dL (ref 0–200)
HDL: 37 mg/dL — ABNORMAL LOW (ref >40)
LDL Cholesterol: 37 mg/dL (ref 0–99)
Total CHOL/HDL Ratio: 2.2 RATIO
Triglycerides: 45 mg/dL (ref <150)
VLDL: 9 mg/dL (ref 0–40)

## 2016-04-06 LAB — ECHOCARDIOGRAM COMPLETE
Height: 68 in
Weight: 2562 oz

## 2016-04-06 LAB — HEMOGLOBIN A1C
Hgb A1c MFr Bld: 5.4 % (ref 4.8–5.6)
Mean Plasma Glucose: 108 mg/dL

## 2016-04-06 LAB — SEDIMENTATION RATE: Sed Rate: 1 mm/hr (ref 0–20)

## 2016-04-06 MED ORDER — ASPIRIN 81 MG PO TBEC: 81.0000 mg | DELAYED_RELEASE_TABLET | Freq: Every day | ORAL | 0 refills | Status: DC

## 2016-04-06 NOTE — Progress Notes (Signed)
*  PRELIMINARY RESULTS* Echocardiogram 2D Echocardiogram has been performed.  Sherrie Sport 04/06/2016, 12:21 PM

## 2016-04-06 NOTE — Progress Notes (Addendum)
Subjective: Patient back to baseline.  No new neurological complaints.    Objective: Current vital signs: BP 116/63 (BP Location: Left Arm)   Pulse 94   Temp 98.6 F (37 C) (Oral)   Resp 16   Ht 5\' 8"  (1.727 m)   Wt 72.6 kg (160 lb 2 oz)   SpO2 98%   BMI 24.35 kg/m  Vital signs in last 24 hours: Temp:  [97.8 F (36.6 C)-99.6 F (37.6 C)] 98.6 F (37 C) (12/29 1003) Pulse Rate:  [75-125] 94 (12/29 1003) Resp:  [16-22] 16 (12/29 1003) BP: (96-116)/(57-71) 116/63 (12/29 1003) SpO2:  [96 %-99 %] 98 % (12/29 1003) Weight:  [72.6 kg (160 lb 2 oz)] 72.6 kg (160 lb 2 oz) (12/28 1800)  Intake/Output from previous day: 12/28 0701 - 12/29 0700 In: 6 [I.V.:6] Out: -  Intake/Output this shift: No intake/output data recorded. Nutritional status: Diet Heart Room service appropriate? Yes; Fluid consistency: Thin  Neurologic Exam: Mental Status: Alert.  Is able to follow commands-speaking some English and some of his native language.   Cranial Nerves: II: Discs flat bilaterally; Visual fields grossly normal, pupils equal, round, reactive to light and accommodation III,IV, VI: ptosis not present, extra-ocular motions intact bilaterally V,VII: smile symmetric, facial light touch sensation normal bilaterally VIII: hearing normal bilaterally IX,X: gag reflex present XI: bilateral shoulder shrug XII: midline tongue extension Motor: Right :  Upper extremity   5/5                                      Left:     Upper extremity   5/5             Lower extremity   5-/5                                                 Lower extremity   5-/5 Tone and bulk:normal tone throughout; no atrophy noted Sensory: Pinprick and light touch intact throughout, bilaterally Deep Tendon Reflexes: 2+ and symmetric throughout   Lab Results: Basic Metabolic Panel:  Recent Labs Lab 04/05/16 1501  NA 133*  K 4.2  CL 102  CO2 22  GLUCOSE 101*  BUN 14  CREATININE 1.02  CALCIUM 9.0    Liver Function  Tests:  Recent Labs Lab 04/05/16 1501  AST 22  ALT 13*  ALKPHOS 53  BILITOT 0.4  PROT 7.7  ALBUMIN 4.0   No results for input(s): LIPASE, AMYLASE in the last 168 hours. No results for input(s): AMMONIA in the last 168 hours.  CBC:  Recent Labs Lab 04/05/16 1501  WBC 15.9*  NEUTROABS 13.8*  HGB 13.2  HCT 38.1*  MCV 78.8*  PLT 289    Cardiac Enzymes:  Recent Labs Lab 04/05/16 1501  TROPONINI 0.03*    Lipid Panel:  Recent Labs Lab 04/06/16 0828  CHOL 83  TRIG 45  HDL 37*  CHOLHDL 2.2  VLDL 9  LDLCALC 37    CBG:  Recent Labs Lab 04/05/16 [place]    Microbiology: No results found for this or any previous visit.  Coagulation Studies:  Recent Labs  04/05/16 1501  LABPROT 13.8  INR 1.06    Imaging: Ct Angio Head W Or Wo Contrast  Result  Date: 04/05/2016 CLINICAL DATA:  Code stroke. RIGHT-sided weakness. Uncontrollable body movements. EXAM: CT ANGIOGRAPHY HEAD AND NECK TECHNIQUE: Multidetector CT imaging of the head and neck was performed using the standard protocol during bolus administration of intravenous contrast. Multiplanar CT image reconstructions and MIPs were obtained to evaluate the vascular anatomy. Carotid stenosis measurements (when applicable) are obtained utilizing NASCET criteria, using the distal internal carotid diameter as the denominator. CONTRAST:  Isovue 370, 75 mL. COMPARISON:  Code stroke CT head at 1456 hours. FINDINGS: CT HEAD Please see the code stroke head CT report performed 30 minutes earlier. CTA NECK Aortic arch: Standard branching. Imaged portion shows no evidence of aneurysm or dissection. No significant stenosis of the major arch vessel origins. Right carotid system: Unremarkable. No evidence of dissection, stenosis (50% or greater) or occlusion. Left carotid system: Unremarkable. No evidence of dissection, stenosis (50% or greater) or occlusion. Vertebral arteries: LEFT vertebral slightly larger, both  patent. No evidence of dissection, stenosis (50% or greater) or occlusion. Nonvascular soft tissues: No neck masses or significant spondylosis. No lung apex lesion CTA HEAD Anterior circulation: Dolichoectasia. Minor cavernous atheromatous change on the LEFT. No significant stenosis, proximal occlusion, aneurysm, or vascular malformation. Posterior circulation: Dolichoectasia. LEFT vertebral dominant. 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 No significant stenosis, acute proximal occlusion, aneurysm, or vascular malformation. Venous sinuses: As permitted by contrast timing, patent. Anatomic variants: None of significance. Delayed phase:   No abnormal intracranial enhancement. IMPRESSION: No extracranial stenosis of significance. No acute intracranial vascular occlusion or abnormal postcontrast enhancement. Chronically occluded LEFT PCA. Electronically Signed   By: Staci Righter M.D.   On: 04/05/2016 16:07   Ct Angio Neck W Or Wo Contrast  Result Date: 04/05/2016 CLINICAL DATA:  Code stroke. RIGHT-sided weakness. Uncontrollable body movements. EXAM: CT ANGIOGRAPHY HEAD AND NECK TECHNIQUE: Multidetector CT imaging of the head and neck was performed using the standard protocol during bolus administration of intravenous contrast. Multiplanar CT image reconstructions and MIPs were obtained to evaluate the vascular anatomy. Carotid stenosis measurements (when applicable) are obtained utilizing NASCET criteria, using the distal internal carotid diameter as the denominator. CONTRAST:  Isovue 370, 75 mL. COMPARISON:  Code stroke CT head at 1456 hours. FINDINGS: CT HEAD Please see the code stroke head CT report performed 30 minutes earlier. CTA NECK Aortic arch: Standard branching. Imaged portion shows no evidence of aneurysm or dissection. No significant stenosis of the major arch vessel origins. Right carotid system: Unremarkable. No evidence of dissection, stenosis (50% or  greater) or occlusion. Left carotid system: Unremarkable. No evidence of dissection, stenosis (50% or greater) or occlusion. Vertebral arteries: LEFT vertebral slightly larger, both patent. No evidence of dissection, stenosis (50% or greater) or occlusion. Nonvascular soft tissues: No neck masses or significant spondylosis. No lung apex lesion CTA HEAD Anterior circulation: Dolichoectasia. Minor cavernous atheromatous change on the LEFT. No significant stenosis, proximal occlusion, aneurysm, or vascular malformation. Posterior circulation: Dolichoectasia. LEFT vertebral dominant. 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 No significant stenosis, acute proximal occlusion, aneurysm, or vascular malformation. Venous sinuses: As permitted by contrast timing, patent. Anatomic variants: None of significance. Delayed phase:   No abnormal intracranial enhancement. IMPRESSION: No extracranial stenosis of significance. No acute intracranial vascular occlusion or abnormal postcontrast enhancement. Chronically occluded LEFT PCA. Electronically Signed   By: Staci Righter M.D.   On: 04/05/2016 16:07   Mr Brain Wo Contrast  Addendum Date: 04/06/2016   ADDENDUM  REPORT: 04/06/2016 12:43 ADDENDUM: Another possible etiology for the diffusion signal in the frontal lobes may be related to seizure. Electronically Signed   By: Franchot Gallo M.D.   On: 04/06/2016 12:43   Result Date: 04/06/2016 CLINICAL DATA:  Stroke. Confusion dysarthria acute right-sided weakness EXAM: MRI HEAD WITHOUT CONTRAST TECHNIQUE: Multiplanar, multiecho pulse sequences of the brain and surrounding structures were obtained without intravenous contrast. COMPARISON:  CT head 04/05/2016 FINDINGS: Brain:  Advanced cerebral atrophy. Severe chronic ischemic changes. Diffuse hyperintensity throughout the cerebral white matter. Chronic infarct in the left occipital lobe. Mild chronic ischemia in the pons. Atrophic  changes in the left cerebral peduncle due to wallerian degeneration from chronic ischemia. Chronic infarct left thalamus. Hyperintense diffusion signal is present in the frontal lobe bilaterally in an unusual pattern. This has a ribbonlike appearance and appears subcortical with scalloped margins. This is most prominent on the right with smaller areas on the left. Review of the ADC map reveals no definite restricted diffusion. These areas of cortex do not show significant edema or swelling on T2 or FLAIR imaging. Vascular: Dolichoectasia of the vertebrobasilar system. Normal flow voids in the circle of Willis. Skull and upper cervical spine: Negative Sinuses/Orbits: Mucosal edema paranasal sinuses. Normal orbit. Left lens replacement Other: None IMPRESSION: Severe cerebral atrophy. Severe chronic ischemic changes throughout the white matter. 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 Given the extensive chronic ischemic changes, this could be subacute ischemia however is not typical in appearance. Other considerations would include acute or chronic infection. Possible Creutzfeldt-Jacob disease is a consideration although this is not a classic imaging appearance. Follow-up MRI, with contrast if possible is suggested in 3-4 weeks. Electronically Signed: By: Franchot Gallo M.D. On: 04/06/2016 12:15   Dg Chest Portable 1 View  Result Date: 04/05/2016 CLINICAL DATA:  Right-sided weakness, confusion. EXAM: PORTABLE CHEST 1 VIEW COMPARISON:  None. FINDINGS: The heart size and mediastinal contours are within normal limits. Both lungs are clear. No pneumothorax or pleural effusion is noted. The visualized skeletal structures are unremarkable. IMPRESSION: No acute cardiopulmonary abnormality seen. Electronically Signed   By: Marijo Conception, M.D.   On: 04/05/2016 16:11   Ct Head Code Stroke W/o Cm  Addendum Date: 04/05/2016   ADDENDUM REPORT: 04/05/2016 15:37 ADDENDUM: Study discussed by telephone With Dr. Alexis Goodell on 04/05/2016 at 1518 hours. Electronically Signed   By: Genevie Ann M.D.   On: 04/05/2016 15:37   Result Date: 04/05/2016 CLINICAL DATA:  Code stroke. 70 year old male with right side weakness. Uncontrollable movements and jerking. Initial encounter. EXAM: CT HEAD WITHOUT CONTRAST TECHNIQUE: Contiguous axial images were obtained from the base of the skull through the vertex without intravenous contrast. COMPARISON:  None. FINDINGS: Brain: Confluent abnormal bilateral cerebral white matter hypodensity. Superimposed chronic infarcts of the left thalamus and left occipital lobe. Wallerian degeneration evident at the left cerebral peduncle and midbrain (series 2, image 14). No other cortical encephalomalacia identified. The remaining brainstem and cerebellum appear within normal limits. No midline shift, ventriculomegaly, mass effect, evidence of mass lesion, intracranial hemorrhage or evidence of cortically based acute infarction. Vascular: Mild Calcified atherosclerosis at the skull base. Generalized intracranial artery dolichoectasia. No suspicious intracranial vascular hyperdensity. Skull: Negative.  No acute osseous abnormality identified. Sinuses/Orbits: Mild bilateral ethmoid and maxillary sinus mucosal thickening. Other visible sinuses, the tympanic  cavities and mastoids are well pneumatized. Other: No acute orbit or scalp soft tissue findings. Postoperative changes to the left globe. ASPECTS [facility] Stroke Program Early CT Score) - Ganglionic level infarction (caudate, lentiform nuclei, internal capsule, insula, M1-M3 cortex): 7 - Supraganglionic infarction (M4-M6 cortex): 3 Total score (0-10 with 10 being normal): 10 IMPRESSION: 1. No acute cortically based infarct or acute intracranial hemorrhage identified. 2.  ASPECTS is 10. 3. 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 Left midbrain Wallerian degeneration. Confluent bilateral cerebral white matter hypodensity likely due to superimposed chronic small vessel disease. 4. 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 Electronically Signed: By: Genevie Ann M.D. On: 04/05/2016 15:11    Medications:  I have reviewed the patient's current medications. Scheduled: . aspirin EC  81 mg Oral Daily  . clopidogrel  75 mg Oral Daily  . enoxaparin (LOVENOX) injection  40 mg Subcutaneous Q24H  . memantine  5 mg Oral BID  . rosuvastatin  20 mg Oral q1800  . sodium chloride flush  3 mL Intravenous Q12H  . sodium chloride flush  3 mL Intravenous Q12H    Assessment/Plan: Patient at baseline.  MRI of the brain reviewed and shows extensive small vessel disease and atrophy but no acute changes.  There are more progressive changes though that do not appear to represent acute infarct.  Unclear etiology.  With patient returning to baseline do not suspect etiology to be infection or CJD.  Patient to continue to have this followed up on an outpatient basis.  Suspect TIA as the etiology for this admission.  A1c 5.4.  LDL 37.    Recommendations: 1.  ASA 81mg  and Plavix 75mg   2.  Follow up with neurology on an outpatient basis.   3.  Will likely benefit from EEG which may be performed on an outpatient basis.     LOS: 0 days   Alexis Goodell, MD Neurology [phone] 04/06/2016  1:37 PM

## 2016-04-06 NOTE — Plan of Care (Signed)
Problem: Education: Goal: Knowledge of [facility] Education information/materials will improve Outcome: Progressing VSS, free of falls during shift.  Ambulated to bathroom, standby assist.  Denies pain, nausea.  Neuro checks negative, NIH 1 for R peripheral vision deficit.  Daughter at bedside, call bell within reach.  WCTM.

## 2016-04-06 NOTE — Evaluation (Signed)
Clinical/Bedside Swallow Evaluation Patient Details  Name: Christopher Murphy MRN: [id number] Date of Birth: Aug 18, 1945  Today's Date: 04/06/2016 Time: SLP Start Time (ACUTE ONLY): 0930 SLP Stop Time (ACUTE ONLY): 1000 SLP Time Calculation (min) (ACUTE ONLY): 30 min  Past Medical History:  Past Medical History:  Diagnosis Date  . Blood clots in brain 2009  . Stroke [facility])    Past Surgical History: No past surgical history on file. HPI:  Christopher Murphy is an 70 y.o. male with a history of stroke who was with family today and expressed that he was feeling poorly.  They noted that he was unable to move his right side and that he was talking "jibberish".  Knows English as a second language but was even having difficulty with his first language.  Patient was brought in for evaluation at that time.  Patient has a history of a left PCA infarct about 8 years ago.  Was most recently followed up in [place].   Assessment / Plan / Recommendation Clinical Impression  Pt presents w/no overt s/s of aspiration w/any tested consistency. Pt given trials of thin, puree and solid. No overt cough/throat clear observed. Vocal quality remained clear throughout. Pt able to masticate and clear solid consistency well. No pocketing or holding was observed. Pt denies any difficulty w/pills and nsg reports pt did not demonstrate difficulty this am. Oral mech exam revealed oral motor abilities to be [facility]. Speech appeared clear and pt responded to ST's questions appropriately. Recommend continue w/regular diet w/thin liquids; general aspiration precautions. No skilled ST services indicated at this time. Please re-consult if further concerns arise.     Aspiration Risk  No limitations    Diet Recommendation Regular;Thin liquid   Liquid Administration via: Cup;Straw Medication Administration: Whole meds with liquid Supervision: Patient able to self feed Compensations: Minimize environmental distractions;Slow  rate;Small sips/bites Postural Changes: Seated upright at 90 degrees    Other  Recommendations Oral Care Recommendations: Oral care BID;Patient independent with oral care   Follow up Recommendations None      Frequency and Duration            Prognosis Prognosis for Safe Diet Advancement: Good      Swallow Study   General Date of Onset: 04/06/16 HPI: Christopher Murphy is an 70 y.o. male with a history of stroke who was with family today and expressed that he was feeling poorly.  They noted that he was unable to move his right side and that he was talking "jibberish".  Knows English as a second language but was even having difficulty with his first language.  Patient was brought in for evaluation at that time.  Patient has a history of a left PCA infarct about 8 years ago.  Was most recently followed up in [place]. Type of Study: Bedside Swallow Evaluation Previous Swallow Assessment: None found Diet Prior to this Study: Regular;Thin liquids Temperature Spikes Noted: No Respiratory Status: Room air History of Recent Intubation: No Behavior/Cognition: Alert;Cooperative;Pleasant mood Oral Cavity Assessment: Within Functional Limits Oral Care Completed by SLP: No Oral Cavity - Dentition: Adequate natural dentition Vision: Functional for self-feeding Self-Feeding Abilities: Able to feed self Patient Positioning: Upright in bed Baseline Vocal Quality: Normal Volitional Cough: Strong Volitional Swallow: Able to elicit    Oral/Motor/Sensory Function Overall Oral Motor/Sensory Function: Within functional limits   Ice Chips Ice chips: Not tested Other Comments: Pt does not like cold drinks   Thin Liquid Thin Liquid: Within functional limits Presentation: Self  Fed;Straw Other Comments: 2 ounces    Nectar Thick Nectar Thick Liquid: Not tested   Honey Thick Honey Thick Liquid: Not tested   Puree Puree: Within functional limits Presentation: Spoon;Self Fed Other Comments: 3 tsp  bites   Solid   GO   Solid: Within functional limits Presentation: Self Fed Other Comments: 2 bites of solid toast    Functional Assessment Tool Used: Clinical judgement Functional Limitations: Swallowing Swallow Current Status [id number]): 0 percent impaired, limited or restricted Swallow Goal Status [id number]): 0 percent impaired, limited or restricted Swallow Discharge Status [id number]): 0 percent impaired, limited or restricted   [place],Aja Whitehair 04/06/2016,10:06 AM

## 2016-04-06 NOTE — Progress Notes (Signed)
Patient discharged home per MD order. All discharge instructions given and all questions answered. 

## 2016-04-06 NOTE — Progress Notes (Signed)
PT Cancellation Note  Patient Details Name: Christopher Murphy MRN: [id number] DOB: 08/09/1945   Cancelled Treatment:    Reason Eval/Treat Not Completed: Other (comment).  Pt has been d/c and was leaving room to go home upon PT arrival.  MRI of brain with no acute abnormality and pt denies any residual numbness/tingling, weakness, or instability with gait.  Family members present and able to confirm that pt is at his baseline. PT will sign off at this time.    Collie Siad PT, DPT 04/06/2016, 1:55 PM

## 2016-04-06 NOTE — Discharge Summary (Signed)
Christopher Murphy at [place] NAME: Christopher Murphy    MR#:  [id number]  DATE OF BIRTH:  1945/06/30  DATE OF ADMISSION:  04/05/2016 ADMITTING PHYSICIAN: Hillary Bow, MD  DATE OF DISCHARGE: 04/06/16  PRIMARY CARE PHYSICIAN: No PCP Per Patient    ADMISSION DIAGNOSIS:  Right sided weakness [R53.1] Transient cerebral ischemia, unspecified type [G45.9]  DISCHARGE DIAGNOSIS:  Active Problems:   TIA (transient ischemic attack)   SECONDARY DIAGNOSIS:   Past Medical History:  Diagnosis Date  . Blood clots in brain 2009  . Stroke [facility])     HOSPITAL COURSE:  Christopher Murphy  is a 70 y.o. male admitted 04/05/2016 with chief complaint Code Stroke . Please see H&P performed by Hillary Bow, MD for further information. Patient presented with right sided weakness and difficulty with speech. Concern for stroke on admission - code stroke. Neurology evaluation. LDL  37 CTA: chronically occluded PCA, no acute findings ECHO MRI:  IMPRESSION: Severe cerebral atrophy. Severe chronic ischemic changes throughout the white matter. 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 Given the extensive chronic ischemic changes, this could be subacute ischemia   MRI reviewed with neurology - no acute abnormality but should follow with neurology as outpatient  DISCHARGE CONDITIONS:   stable  CONSULTS OBTAINED:  Treatment Team:  Catarina Hartshorn, MD Alexis Goodell, MD  DRUG ALLERGIES:  No Known Allergies  DISCHARGE MEDICATIONS:   Current Discharge Medication List    START taking these medications   Details  aspirin EC 81 MG EC tablet Take 1 tablet (81 mg total) by mouth daily. Qty: 30 tablet, Refills: 0      CONTINUE these medications which have NOT  CHANGED   Details  clopidogrel (PLAVIX) 75 MG tablet Take 75 mg by mouth daily.    Folic Acid 5 MG CAPS Take 5 mg by mouth daily.     memantine (NAMENDA) 5 MG tablet Take 5 mg by mouth 2 (two) times daily.    rosuvastatin (CRESTOR) 20 MG tablet Take 20 mg by mouth daily.         DISCHARGE INSTRUCTIONS:  Aspirin and plavix on discharge Follow up with neurologist as outpatient   DIET:  Regular diet  DISCHARGE CONDITION:  Stable  ACTIVITY:  Activity as tolerated  OXYGEN:  Home Oxygen: No.   Oxygen Delivery: room air  DISCHARGE LOCATION:  home   If you experience worsening of your admission symptoms, develop shortness of breath, life threatening emergency, suicidal or homicidal thoughts you must seek medical attention immediately by calling 911 or calling your MD immediately  if symptoms less severe.  You Must read complete instructions/literature along with all the possible adverse reactions/side effects for all the Medicines you take and that have been prescribed to you. Take any new Medicines after you have completely understood and accpet all the possible adverse reactions/side effects.   Please note  You were cared for by a hospitalist during your hospital stay. If you have any questions about your discharge medications or the care you received while you were in the hospital after you are discharged, you can call the unit and asked to speak with the hospitalist on call if the hospitalist that took care of you is not available. Once you are discharged, your primary care physician will handle any  further medical issues. Please note that NO REFILLS for any discharge medications will be authorized once you are discharged, as it is imperative that you return to your primary care physician (or establish a relationship with a primary care physician if you do not have one) for your aftercare needs so that they can reassess your need for medications and monitor your lab  values.    On the day of Discharge:   VITAL SIGNS:  Blood pressure 116/63, pulse 94, temperature 98.6 F (37 C), temperature source Oral, resp. rate 16, height 5\' 8"  (1.727 m), weight 72.6 kg (160 lb 2 oz), SpO2 98 %.  I/O:   Intake/Output Summary (Last 24 hours) at 04/06/16 1300 Last data filed at 04/05/16 2258  Gross per 24 hour  Intake                6 ml  Output                0 ml  Net                6 ml    PHYSICAL EXAMINATION:  GENERAL:  70 y.o.-year-old patient lying in the bed with no acute distress.  EYES: Pupils equal, round, reactive to light and accommodation. No scleral icterus. Extraocular muscles intact.  HEENT: Head atraumatic, normocephalic. Oropharynx and nasopharynx clear.  NECK:  Supple, no jugular venous distention. No thyroid enlargement, no tenderness.  LUNGS: Normal breath sounds bilaterally, no wheezing, rales,rhonchi or crepitation. No use of accessory muscles of respiration.  CARDIOVASCULAR: S1, S2 normal. No murmurs, rubs, or gallops.  ABDOMEN: Soft, non-tender, non-distended. Bowel sounds present. No organomegaly or mass.  EXTREMITIES: No pedal edema, cyanosis, or clubbing.  NEUROLOGIC: Cranial nerves II through XII are intact. Muscle strength 5/5 in all extremities. Sensation intact. Gait not checked.  PSYCHIATRIC: The patient is alert and oriented x 3.  SKIN: No obvious rash, lesion, or ulcer.   DATA REVIEW:   CBC  Recent Labs Lab 04/05/16 1501  WBC 15.9*  HGB 13.2  HCT 38.1*  PLT 289    Chemistries   Recent Labs Lab 04/05/16 1501  NA 133*  K 4.2  CL 102  CO2 22  GLUCOSE 101*  BUN 14  CREATININE 1.02  CALCIUM 9.0  AST 22  ALT 13*  ALKPHOS 53  BILITOT 0.4    Cardiac Enzymes  Recent Labs Lab 04/05/16 1501  TROPONINI 0.03*    Microbiology Results  No results found for this or any previous visit.  RADIOLOGY:  Ct Angio Head W Or Wo Contrast  Result Date: 04/05/2016 CLINICAL DATA:  Code stroke. RIGHT-sided  weakness. Uncontrollable body movements. EXAM: CT ANGIOGRAPHY HEAD AND NECK TECHNIQUE: Multidetector CT imaging of the head and neck was performed using the standard protocol during bolus administration of intravenous contrast. Multiplanar CT image reconstructions and MIPs were obtained to evaluate the vascular anatomy. Carotid stenosis measurements (when applicable) are obtained utilizing NASCET criteria, using the distal internal carotid diameter as the denominator. CONTRAST:  Isovue 370, 75 mL. COMPARISON:  Code stroke CT head at 1456 hours. FINDINGS: CT HEAD Please see the code stroke head CT report performed 30 minutes earlier. CTA NECK Aortic arch: Standard branching. Imaged portion shows no evidence of aneurysm or dissection. No significant stenosis of the major arch vessel origins. Right carotid system: Unremarkable. No evidence of dissection, stenosis (50% or greater) or occlusion. Left carotid system: Unremarkable. No evidence of dissection, stenosis (50% or greater) or occlusion. Vertebral  arteries: LEFT vertebral slightly larger, both patent. No evidence of dissection, stenosis (50% or greater) or occlusion. Nonvascular soft tissues: No neck masses or significant spondylosis. No lung apex lesion CTA HEAD Anterior circulation: Dolichoectasia. Minor cavernous atheromatous change on the LEFT. No significant stenosis, proximal occlusion, aneurysm, or vascular malformation. Posterior circulation: Dolichoectasia. LEFT vertebral dominant. 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 No significant stenosis, acute proximal occlusion, aneurysm, or vascular malformation. Venous sinuses: As permitted by contrast timing, patent. Anatomic variants: None of significance. Delayed phase:   No abnormal intracranial enhancement. IMPRESSION: No extracranial stenosis of significance. No acute intracranial vascular occlusion or abnormal postcontrast enhancement. Chronically occluded  LEFT PCA. Electronically Signed   By: Staci Righter M.D.   On: 04/05/2016 16:07   Ct Angio Neck W Or Wo Contrast  Result Date: 04/05/2016 CLINICAL DATA:  Code stroke. RIGHT-sided weakness. Uncontrollable body movements. EXAM: CT ANGIOGRAPHY HEAD AND NECK TECHNIQUE: Multidetector CT imaging of the head and neck was performed using the standard protocol during bolus administration of intravenous contrast. Multiplanar CT image reconstructions and MIPs were obtained to evaluate the vascular anatomy. Carotid stenosis measurements (when applicable) are obtained utilizing NASCET criteria, using the distal internal carotid diameter as the denominator. CONTRAST:  Isovue 370, 75 mL. COMPARISON:  Code stroke CT head at 1456 hours. FINDINGS: CT HEAD Please see the code stroke head CT report performed 30 minutes earlier. CTA NECK Aortic arch: Standard branching. Imaged portion shows no evidence of aneurysm or dissection. No significant stenosis of the major arch vessel origins. Right carotid system: Unremarkable. No evidence of dissection, stenosis (50% or greater) or occlusion. Left carotid system: Unremarkable. No evidence of dissection, stenosis (50% or greater) or occlusion. Vertebral arteries: LEFT vertebral slightly larger, both patent. No evidence of dissection, stenosis (50% or greater) or occlusion. Nonvascular soft tissues: No neck masses or significant spondylosis. No lung apex lesion CTA HEAD Anterior circulation: Dolichoectasia. Minor cavernous atheromatous change on the LEFT. No significant stenosis, proximal occlusion, aneurysm, or vascular malformation. Posterior circulation: Dolichoectasia. LEFT vertebral dominant. 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 No significant stenosis, acute proximal occlusion, aneurysm, or vascular malformation. Venous sinuses: As permitted by contrast timing, patent. Anatomic variants: None of significance. Delayed phase:   No  abnormal intracranial enhancement. IMPRESSION: No extracranial stenosis of significance. No acute intracranial vascular occlusion or abnormal postcontrast enhancement. Chronically occluded LEFT PCA. Electronically Signed   By: Staci Righter M.D.   On: 04/05/2016 16:07   Mr Brain Wo Contrast  Addendum Date: 04/06/2016   ADDENDUM REPORT: 04/06/2016 12:43 ADDENDUM: Another possible etiology for the diffusion signal in the frontal lobes may be related to seizure. Electronically Signed   By: Franchot Gallo M.D.   On: 04/06/2016 12:43   Result Date: 04/06/2016 CLINICAL DATA:  Stroke. Confusion dysarthria acute right-sided weakness EXAM: MRI HEAD WITHOUT CONTRAST TECHNIQUE: Multiplanar, multiecho pulse sequences of the brain and surrounding structures were obtained without intravenous contrast. COMPARISON:  CT head 04/05/2016 FINDINGS: Brain:  Advanced cerebral atrophy. Severe chronic ischemic changes. Diffuse hyperintensity throughout the cerebral white matter. Chronic infarct in the left occipital lobe. Mild chronic ischemia in the pons. Atrophic changes in the left cerebral peduncle due to wallerian degeneration from chronic ischemia. Chronic infarct left thalamus. Hyperintense diffusion signal is present in the frontal lobe bilaterally in an unusual pattern. This has a ribbonlike appearance and appears subcortical with scalloped margins. This is most prominent on the right  with smaller areas on the left. Review of the ADC map reveals no definite restricted diffusion. These areas of cortex do not show significant edema or swelling on T2 or FLAIR imaging. Vascular: Dolichoectasia of the vertebrobasilar system. Normal flow voids in the circle of Willis. Skull and upper cervical spine: Negative Sinuses/Orbits: Mucosal edema paranasal sinuses. Normal orbit. Left lens replacement Other: None IMPRESSION: Severe cerebral atrophy. Severe chronic ischemic changes throughout the white matter. Chronic infarct left thalamus  and left occipital lobe Unusual pattern of hyperintense diffusion signal in the frontal lobes right greater than left which appears subcortical in location without definite restricted diffusion on ADC map. Given the extensive chronic ischemic changes, this could be subacute ischemia however is not typical in appearance. Other considerations would include acute or chronic infection. Possible Creutzfeldt-Jacob disease is a consideration although this is not a classic imaging appearance. Follow-up MRI, with contrast if possible is suggested in 3-4 weeks. Electronically Signed: By: Franchot Gallo M.D. On: 04/06/2016 12:15   Dg Chest Portable 1 View  Result Date: 04/05/2016 CLINICAL DATA:  Right-sided weakness, confusion. EXAM: PORTABLE CHEST 1 VIEW COMPARISON:  None. FINDINGS: The heart size and mediastinal contours are within normal limits. Both lungs are clear. No pneumothorax or pleural effusion is noted. The visualized skeletal structures are unremarkable. IMPRESSION: No acute cardiopulmonary abnormality seen. Electronically Signed   By: Marijo Conception, M.D.   On: 04/05/2016 16:11   Ct Head Code Stroke W/o Cm  Addendum Date: 04/05/2016   ADDENDUM REPORT: 04/05/2016 15:37 ADDENDUM: Study discussed by telephone With Dr. Alexis Goodell on 04/05/2016 at 1518 hours. Electronically Signed   By: Genevie Ann M.D.   On: 04/05/2016 15:37   Result Date: 04/05/2016 CLINICAL DATA:  Code stroke. 70 year old male with right side weakness. Uncontrollable movements and jerking. Initial encounter. EXAM: CT HEAD WITHOUT CONTRAST TECHNIQUE: Contiguous axial images were obtained from the base of the skull through the vertex without intravenous contrast. COMPARISON:  None. FINDINGS: Brain: Confluent abnormal bilateral cerebral white matter hypodensity. Superimposed chronic infarcts of the left thalamus and left occipital lobe. Wallerian degeneration evident at the left cerebral peduncle and midbrain (series 2, image 14). No  other cortical encephalomalacia identified. The remaining brainstem and cerebellum appear within normal limits. No midline shift, ventriculomegaly, mass effect, evidence of mass lesion, intracranial hemorrhage or evidence of cortically based acute infarction. Vascular: Mild Calcified atherosclerosis at the skull base. Generalized intracranial artery dolichoectasia. No suspicious intracranial vascular hyperdensity. Skull: Negative.  No acute osseous abnormality identified. Sinuses/Orbits: Mild bilateral ethmoid and maxillary sinus mucosal thickening. Other visible sinuses, the tympanic cavities and mastoids are well pneumatized. Other: No acute orbit or scalp soft tissue findings. Postoperative changes to the left globe. ASPECTS [facility] Stroke Program Early CT Score) - Ganglionic level infarction (caudate, lentiform nuclei, internal capsule, insula, M1-M3 cortex): 7 - Supraganglionic infarction (M4-M6 cortex): 3 Total score (0-10 with 10 being normal): 10 IMPRESSION: 1. No acute cortically based infarct or acute intracranial hemorrhage identified. 2. ASPECTS is 10. 3. 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 Left midbrain Wallerian degeneration. Confluent bilateral cerebral white matter hypodensity likely due to superimposed chronic small vessel disease. 4. 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 Electronically Signed: By: Genevie Ann M.D. On: 04/05/2016 15:11     Management plans discussed with the patient, family and they are in agreement.  CODE STATUS:     Code Status Orders  Start     Ordered   04/05/16 1652  Full code  Continuous     04/05/16 1653    Code Status History    Date Active Date Inactive Code Status Order ID Comments User Context   This patient has a current code status but no historical code status.      TOTAL TIME TAKING CARE OF THIS PATIENT: 33 minutes.    Leinani Lisbon,  Karenann Cai.D on  04/06/2016 at 1:00 PM  Between 7am to 6pm - Pager - [phone]  After 6pm go to [email or web address] - [employer] [facility]  Office  [phone]  CC: Primary care physician; No PCP Per Patient

## 2016-04-06 NOTE — Progress Notes (Signed)
PT Attempt Note  Patient Details Name: Christopher Murphy MRN: [id number] DOB: [birth year]   Cancelled Treatment:    Reason Eval/Treat Not Completed: Patient at procedure or test/unavailable. Chart reviewed and RN consulted. Attempted to see patient however he is currently out of room for imaging. Will attempt PT evaluation at later time/date as pt is available and appropriate.  Lyndel Safe Litha Lamartina PT, DPT   Demonie Kassa 04/06/2016, 10:59 AM

## 2016-06-12 ENCOUNTER — Encounter: Payer: Self-pay | Admitting: Internal Medicine

## 2016-06-12 ENCOUNTER — Ambulatory Visit (INDEPENDENT_AMBULATORY_CARE_PROVIDER_SITE_OTHER): Payer: BLUE CROSS/BLUE SHIELD | Admitting: Internal Medicine

## 2016-06-12 VITALS — BP 122/68 | HR 70 | Temp 97.7°F | Ht 68.0 in | Wt 162.0 lb

## 2016-06-12 DIAGNOSIS — G458 Other transient cerebral ischemic attacks and related syndromes: Secondary | ICD-10-CM

## 2016-06-12 DIAGNOSIS — I6930 Unspecified sequelae of cerebral infarction: Secondary | ICD-10-CM | POA: Insufficient documentation

## 2016-06-12 DIAGNOSIS — H04123 Dry eye syndrome of bilateral lacrimal glands: Secondary | ICD-10-CM

## 2016-06-12 DIAGNOSIS — Z8673 Personal history of transient ischemic attack (TIA), and cerebral infarction without residual deficits: Secondary | ICD-10-CM

## 2016-06-12 DIAGNOSIS — H6123 Impacted cerumen, bilateral: Secondary | ICD-10-CM

## 2016-06-12 MED ORDER — POLYETHYL GLYCOL-PROPYL GLYCOL 0.4-0.3 % OP SOLN: 1.0000 [drp] | Freq: Two times a day (BID) | OPHTHALMIC | 5 refills | Status: DC

## 2016-06-12 NOTE — Assessment & Plan Note (Signed)
Systane refilled today

## 2016-06-12 NOTE — Assessment & Plan Note (Signed)
S/p stroke He has had multiple TIA Continue ASA, Plavix and Crestor

## 2016-06-12 NOTE — Patient Instructions (Signed)
Dry Eye Dry eye, also called keratoconjunctivitis sicca, is dryness of the membranes surrounding the eye. It happens when there are not enough healthy, natural tears in the eyes. The eyes must remain moist at all times. A small amount of tears is constantly produced by the tear glands (lacrimal glands). These glands are located under the outside part of the upper eyelids. Dryness of the eyes can be a symptom of a variety of conditions, such as rheumatoid arthritis, lupus, or Sjgren syndrome. Dry eye may be mild to severe. What are the causes? This condition may be caused by:  Not making enough tears (aqueous tear-deficient dry eyes).  Tears evaporating from the eye too quickly (evaporative dry eyes). This is when there is an abnormality in the quality of your tears. This abnormality causes your tears to evaporate so quickly that the eye cannot be kept moist. What increases the risk? This condition is more likely to happen in:  Women, especially those who have gone through menopause.  People in dry climates.  People in dusty or smoky areas.  People who take certain medicines, such as:  Anti-allergy medicines (antihistamines).  Blood pressure medicines (antihypertensives).  Birth control pills (oral contraceptives).  Laxatives.  Tranquilizers. What are the signs or symptoms? Symptoms in the eyes may include:  Irritation.  Itchiness.  Redness.  Burning.  Inflammation of the eyelids.  Feeling as though something is stuck in the eye.  Light sensitivity.  Increased sensitivity and discomfort when wearing contact lenses, if this applies.  Vision that varies throughout the day.  Occasional excessive tearing. How is this diagnosed? This condition is diagnosed based on your symptoms, your medical history, and an eye exam. Your health care provider may look at your eye using a microscope and may put dyes in your eye to check the health of the surface of your eye. You may  have a tests, such as a test to evaluate your tear production (Schirmer test). During this test, a small strip of special paper is gently pressed into the inner corner of your eye. Your tear production is measured by how much of the paper is moistened by your tears during a set amount of time. You may be referred to a health care provider who specializes in eyes and eyesight (ophthalmologist). How is this treated? This condition is often treated at home. Your health care provider may recommend eye drops, which are also called artificial tears. If your condition is severe, treatment may include:  Prescription eye drops.  Over-the-counter or prescription ointments to moisten your eyes.  Minor surgery to block tears from going into your nose.  Medicines to reduce inflammation of the eyelids. Follow these instructions at home:  Take, use, or apply over-the-counter and prescription medicines only as told by your health care provider. This includes eye drops.  If directed, apply a warm compress to your eyes to help reduce inflammation. Place a towel over your eyes and gently press the warm compress over your eyes for about 5 minutes, or as long as told by your health care provider.  If possible, avoid dry, drafty environments.  Use a humidifier at home to increase moisture in the air.  If you wear contact lenses, remove them regularly to give your eyes a break. Always remove contacts before sleeping.  Keep all follow-up visits as told by your health care provider. This is important. This includes yearly eye exams and vision tests. Contact a health care provider if:  You have eye pain.    You have pus-like fluid coming from your eye.  Your symptoms get worse or do not improve with treatment. Get help right away if:  Your vision suddenly changes. This information is not intended to replace advice given to you by your health care provider. Make sure you discuss any questions you have with  your health care provider. Document Released: 02/11/2004 Document Revised: 09/01/2015 Document Reviewed: 01/19/2015 Elsevier Interactive Patient Education  2017 [employer]  

## 2016-06-12 NOTE — Progress Notes (Signed)
HPI  Pt presents to the clinic today to establish care and for management of the conditions listed below. He moved from [place] 1 year ago. He has not seen anyone besides the ER for care.  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 Most recent TIA 03/2016. He is taking ASA, Plavix and Crestor as prescribed. His most recent lipid profile from 03/2016 reviewed. 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 He is taking Namenda and family reports it helps.  Chronic Dry Eyes: He uses Systane Eye Drops with good relief. He is requesting a refill of this today.   Flu: 03/2016 Tetanus: 02/2015 PSA Screening: unsure Colon Screening: unsure Vision Screening: yearly Dentist: as needed  Past Medical History:  Diagnosis Date  . Blood clots in brain 2009  . Stroke [facility])     Current Outpatient Prescriptions  Medication Sig Dispense Refill  . aspirin EC 81 MG EC tablet Take 1 tablet (81 mg total) by mouth daily. 30 tablet 0  . clopidogrel (PLAVIX) 75 MG tablet Take 75 mg by mouth daily.    . Folic Acid 5 MG CAPS Take 5 mg by mouth daily.     . memantine (NAMENDA) 5 MG tablet Take 5 mg by mouth 2 (two) times daily.    . rosuvastatin (CRESTOR) 20 MG tablet Take 20 mg by mouth daily.     No current facility-administered medications for this visit.     No Known Allergies  Family History  Problem Relation Age of Onset  . AAA (abdominal aortic aneurysm) Neg Hx     Social History   Social History  . Marital status: Married    Spouse name: N/A  . Number of children: N/A  . Years of education: N/A   Occupational History  . Not on file.   Social History Main Topics  . Smoking status: Never Smoker  . Smokeless tobacco: Never Used  . Alcohol use No  . Drug use: No  . Sexual activity: Not on file   Other Topics Concern  . Not on file   Social History Narrative  . No narrative on file    ROS:  Constitutional: Denies fever, malaise, fatigue, headache or abrupt  weight changes.  HEENT: Pt reports chronic buildup of ear wax. Denies eye pain, eye redness, ear pain, ringing in the ears, runny nose, nasal congestion, bloody nose, or sore throat. Respiratory: Denies difficulty breathing, shortness of breath, cough or sputum production.   Cardiovascular: Denies chest pain, chest tightness, palpitations or swelling in the hands or feet.  Gastrointestinal: Denies abdominal pain, bloating, constipation, diarrhea or blood in the stool.  GU: Denies frequency, urgency, pain with urination, blood in urine, odor or discharge. Musculoskeletal: Denies decrease in range of motion, difficulty with gait, muscle pain or joint pain and swelling.  Skin: Denies redness, rashes, lesions or ulcercations.  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 Denies dizziness, difficulty with speech or problems with balance and coordination.  Psych: Denies anxiety, depression, SI/HI.  No other specific complaints in a complete review of systems (except as listed in HPI above).  PE:  BP 122/68   Pulse 70   Temp 97.7 F (36.5 C) (Oral)   Ht 5\' 8"  (1.727 m)   Wt 162 lb (73.5 kg)   SpO2 99%   BMI 24.63 kg/m  Wt Readings from Last 3 Encounters:  06/12/16 162 lb (73.5 kg)  04/05/16 160 lb 2 oz (72.6 kg)  02/21/15 160 lb (72.6 kg)  General: Appears his stated age, in NAD. ENT: Bilateral cerumen impaction. Cardiovascular: Normal rate and rhythm. S1,S2 noted.  No murmur, rubs or gallops noted.  No carotid bruits noted. Pulmonary/Chest: Normal effort and positive vesicular breath sounds. No respiratory distress. No wheezes, rales or ronchi noted.  Neurological: Alert and oriented.  Psychiatric: Mood and affect normal. Behavior is normal. Judgment and thought content normal.     BMET    Component Value Date/Time   NA 133 (L) 04/05/2016 1501   K 4.2 04/05/2016 1501   CL 102 04/05/2016 1501   CO2 22 04/05/2016 1501   GLUCOSE 101 (H) 04/05/2016 1501   BUN 14  04/05/2016 1501   CREATININE 1.02 04/05/2016 1501   CREATININE 0.93 02/21/2015 1008   CALCIUM 9.0 04/05/2016 1501   GFRNONAA >60 04/05/2016 1501   GFRNONAA 83 02/21/2015 1008   GFRAA >60 04/05/2016 1501   GFRAA >89 02/21/2015 1008    Lipid Panel     Component Value Date/Time   CHOL 83 04/06/2016 0828   TRIG 45 04/06/2016 0828   HDL 37 (L) 04/06/2016 0828   CHOLHDL 2.2 04/06/2016 0828   VLDL 9 04/06/2016 0828   LDLCALC 37 04/06/2016 0828    CBC    Component Value Date/Time   WBC 15.9 (H) 04/05/2016 1501   RBC 4.84 04/05/2016 1501   HGB 13.2 04/05/2016 1501   HCT 38.1 (L) 04/05/2016 1501   PLT 289 04/05/2016 1501   MCV 78.8 (L) 04/05/2016 1501   MCH 27.2 04/05/2016 1501   MCHC 34.5 04/05/2016 1501   RDW 13.4 04/05/2016 1501   LYMPHSABS 0.8 (L) 04/05/2016 1501   MONOABS 0.8 04/05/2016 1501   EOSABS 0.2 04/05/2016 1501   BASOSABS 0.3 (H) 04/05/2016 1501    Hgb A1C Lab Results  Component Value Date   HGBA1C 5.4 04/05/2016     Assessment and Plan:  Bilateral Cerumen Impaction:  Manual lavage by CMA  Advised him to try Debrox OTC 2 x week to prevent buildup  RTC in 1 year for annual exam Webb Silversmith, NP

## 2016-06-12 NOTE — Assessment & Plan Note (Signed)
2009 Subsequent memory impairment Continue ASA, Plavix, Crestor and Namenda Will monitor

## 2016-09-14 ENCOUNTER — Encounter: Payer: Self-pay | Admitting: Family Medicine

## 2016-09-14 ENCOUNTER — Ambulatory Visit (INDEPENDENT_AMBULATORY_CARE_PROVIDER_SITE_OTHER): Payer: BLUE CROSS/BLUE SHIELD | Admitting: Family Medicine

## 2016-09-14 DIAGNOSIS — H6123 Impacted cerumen, bilateral: Secondary | ICD-10-CM

## 2016-09-14 NOTE — Assessment & Plan Note (Signed)
Bilateral ears irrigation without complication.

## 2016-09-14 NOTE — Progress Notes (Signed)
   Subjective:    Patient ID: Christopher Murphy, male    DOB: 1945-08-15, 70 y.o.   MRN: [id number]  HPI    71 year old male presents with bilateral decreased hearing. No ear pain or fullness Use Q-tips occ.  no fever, no ear discharge.   Had abnormal hearing test at [facility].. Saw wax both ears.    Review of Systems  Constitutional: Negative for fatigue.  HENT: Negative for ear discharge, ear pain, postnasal drip, rhinorrhea and sinus pain.   Eyes: Negative for pain.  Respiratory: Negative for shortness of breath.   Cardiovascular: Negative for chest pain.       Objective:   Physical Exam  Constitutional: Vital signs are normal. He appears well-developed and well-nourished.  Non-toxic appearance. He does not appear ill. No distress.  HENT:  Head: Normocephalic and atraumatic.  Right Ear: Hearing, tympanic membrane, external ear and ear canal normal. No tenderness. No foreign bodies. Tympanic membrane is not retracted and not bulging.  Left Ear: Hearing, tympanic membrane, external ear and ear canal normal. No tenderness. No foreign bodies. Tympanic membrane is not retracted and not bulging.  Nose: Nose normal. No mucosal edema or rhinorrhea. Right sinus exhibits no maxillary sinus tenderness and no frontal sinus tenderness. Left sinus exhibits no maxillary sinus tenderness and no frontal sinus tenderness.  Mouth/Throat: Uvula is midline, oropharynx is clear and moist and mucous membranes are normal. Normal dentition. No dental caries. No oropharyngeal exudate or tonsillar abscesses.  Bilateral cerumen without occlusion.Margy Clarks with water without complication.  Eyes: Conjunctivae, EOM and lids are normal. Pupils are equal, round, and reactive to light. Lids are everted and swept, no foreign bodies found.  Neck: Trachea normal, normal range of motion and phonation normal. Neck supple. Carotid bruit is not present. No thyroid mass and no thyromegaly present.  Cardiovascular:  Normal rate, regular rhythm, S1 normal, S2 normal, normal heart sounds, intact distal pulses and normal pulses.  Exam reveals no gallop.   No murmur heard. Pulmonary/Chest: Effort normal and breath sounds normal. No respiratory distress. He has no wheezes. He has no rhonchi. He has no rales.  Abdominal: Soft. Normal appearance and bowel sounds are normal. There is no hepatosplenomegaly. There is no tenderness. There is no rebound, no guarding and no CVA tenderness. No hernia.  Neurological: He is alert. He has normal reflexes.  Skin: Skin is warm, dry and intact. No rash noted.  Psychiatric: He has a normal mood and affect. His speech is normal and behavior is normal. Judgment normal.          Assessment & Plan:

## 2016-10-03 ENCOUNTER — Encounter: Payer: Self-pay | Admitting: Internal Medicine

## 2016-10-03 ENCOUNTER — Ambulatory Visit (INDEPENDENT_AMBULATORY_CARE_PROVIDER_SITE_OTHER): Payer: BLUE CROSS/BLUE SHIELD | Admitting: Internal Medicine

## 2016-10-03 VITALS — BP 110/76 | HR 66 | Temp 98.3°F | Wt 155.0 lb

## 2016-10-03 DIAGNOSIS — H6122 Impacted cerumen, left ear: Secondary | ICD-10-CM | POA: Diagnosis not present

## 2016-10-03 MED ORDER — CLOPIDOGREL BISULFATE 75 MG PO TABS
75.0000 mg | ORAL_TABLET | Freq: Every day | ORAL | 1 refills | Status: DC
Start: 2016-10-03 — End: 2019-06-12

## 2016-10-03 MED ORDER — MEMANTINE HCL 5 MG PO TABS: 5.0000 mg | ORAL_TABLET | Freq: Two times a day (BID) | ORAL | 1 refills | Status: DC

## 2016-10-03 MED ORDER — ROSUVASTATIN CALCIUM 20 MG PO TABS: 20.0000 mg | ORAL_TABLET | Freq: Every day | ORAL | 1 refills | Status: DC

## 2016-10-03 NOTE — Progress Notes (Signed)
Subjective:    Patient ID: Christopher Murphy, male    DOB: Oct 04, 1945, 71 y.o.   MRN: [id number]  HPI  Pt presents to the clinic today with c/o cerumen impaction. He came here 6/8 to have the cerumen removed prior to audiology evaluation for hearing aids. They went to the audiologist and was advised that all of the cerumen had not been removed from the left ear.  His daughter is also requesting refills of all of his medications today.  Review of Systems      Past Medical History:  Diagnosis Date  . Blood clots in brain 2009  . Stroke [facility])     Current Outpatient Prescriptions  Medication Sig Dispense Refill  . aspirin EC 81 MG EC tablet Take 1 tablet (81 mg total) by mouth daily. 30 tablet 0  . clopidogrel (PLAVIX) 75 MG tablet Take 75 mg by mouth daily.    . Folic Acid 5 MG CAPS Take 5 mg by mouth daily.     . memantine (NAMENDA) 5 MG tablet Take 5 mg by mouth 2 (two) times daily.    Vladimir Faster Glycol-Propyl Glycol (SYSTANE) 0.4-0.3 % SOLN Apply 1 drop to eye 2 (two) times daily. 30 mL 5  . rosuvastatin (CRESTOR) 20 MG tablet Take 20 mg by mouth daily.     No current facility-administered medications for this visit.     No Known Allergies  Family History  Problem Relation Age of Onset  . AAA (abdominal aortic aneurysm) Neg Hx     Social History   Social History  . Marital status: Married    Spouse name: N/A  . Number of children: N/A  . Years of education: N/A   Occupational History  . Not on file.   Social History Main Topics  . Smoking status: Never Smoker  . Smokeless tobacco: Never Used  . Alcohol use No  . Drug use: No  . Sexual activity: Not on file   Other Topics Concern  . Not on file   Social History Narrative  . No narrative on file     Constitutional: Denies fever, malaise, fatigue, headache or abrupt weight changes.  HEENT: Pt reports decreased hearing and wax buildup. Denies eye pain, eye redness, ear pain, ringing in the ears,  runny nose, nasal congestion, bloody nose, or sore throat.  No other specific complaints in a complete review of systems (except as listed in HPI above).  Objective:   Physical Exam    BP 110/76 (BP Location: Left Arm, Patient Position: Sitting, Cuff Size: Normal)   Pulse 66   Temp 98.3 F (36.8 C) (Oral)   Wt 155 lb (70.3 kg)   SpO2 98%   BMI 23.57 kg/m  Wt Readings from Last 3 Encounters:  10/03/16 155 lb (70.3 kg)  09/14/16 155 lb 12 oz (70.6 kg)  06/12/16 162 lb (73.5 kg)    General: Appears his stated age, well developed, well nourished in NAD. HEENT:  Left Ear: Cerumen impaction.  BMET    Component Value Date/Time   NA 133 (L) 04/05/2016 1501   K 4.2 04/05/2016 1501   CL 102 04/05/2016 1501   CO2 22 04/05/2016 1501   GLUCOSE 101 (H) 04/05/2016 1501   BUN 14 04/05/2016 1501   CREATININE 1.02 04/05/2016 1501   CREATININE 0.93 02/21/2015 1008   CALCIUM 9.0 04/05/2016 1501   GFRNONAA >60 04/05/2016 1501   GFRNONAA 83 02/21/2015 1008   GFRAA >60 04/05/2016 1501  GFRAA >89 02/21/2015 1008    Lipid Panel     Component Value Date/Time   CHOL 83 04/06/2016 0828   TRIG 45 04/06/2016 0828   HDL 37 (L) 04/06/2016 0828   CHOLHDL 2.2 04/06/2016 0828   VLDL 9 04/06/2016 0828   LDLCALC 37 04/06/2016 0828    CBC    Component Value Date/Time   WBC 15.9 (H) 04/05/2016 1501   RBC 4.84 04/05/2016 1501   HGB 13.2 04/05/2016 1501   HCT 38.1 (L) 04/05/2016 1501   PLT 289 04/05/2016 1501   MCV 78.8 (L) 04/05/2016 1501   MCH 27.2 04/05/2016 1501   MCHC 34.5 04/05/2016 1501   RDW 13.4 04/05/2016 1501   LYMPHSABS 0.8 (L) 04/05/2016 1501   MONOABS 0.8 04/05/2016 1501   EOSABS 0.2 04/05/2016 1501   BASOSABS 0.3 (H) 04/05/2016 1501    Hgb A1C Lab Results  Component Value Date   HGBA1C 5.4 04/05/2016          Assessment & Plan:   Cerumen Impaction, Left Ear:  Manual removal using a cerumen spoon by this provider Advised him to try Debrox 2 x week to  prevent wax buildup  All medications refilled  RTC as needed or if symptoms persist or worsen Ardith Lewman, NP

## 2016-10-03 NOTE — Patient Instructions (Signed)
Earwax Buildup, Adult The ears produce a substance called earwax that helps keep bacteria out of the ear and protects the skin in the ear canal. Occasionally, earwax can build up in the ear and cause discomfort or hearing loss. What increases the risk? This condition is more likely to develop in people who:  Are male.  Are elderly.  Naturally produce more earwax.  Clean their ears often with cotton swabs.  Use earplugs often.  Use in-ear headphones often.  Wear hearing aids.  Have narrow ear canals.  Have earwax that is overly thick or sticky.  Have eczema.  Are dehydrated.  Have excess hair in the ear canal.  What are the signs or symptoms? Symptoms of this condition include:  Reduced or muffled hearing.  A feeling of fullness in the ear or feeling that the ear is plugged.  Fluid coming from the ear.  Ear pain.  Ear itch.  Ringing in the ear.  Coughing.  An obvious piece of earwax that can be seen inside the ear canal.  How is this diagnosed? This condition may be diagnosed based on:  Your symptoms.  Your medical history.  An ear exam. During the exam, your health care provider will look into your ear with an instrument called an otoscope.  You may have tests, including a hearing test. How is this treated? This condition may be treated by:  Using ear drops to soften the earwax.  Having the earwax removed by a health care provider. The health care provider may: ? Flush the ear with water. ? Use an instrument that has a loop on the end (curette). ? Use a suction device.  Surgery to remove the wax buildup. This may be done in severe cases.  Follow these instructions at home:  Take over-the-counter and prescription medicines only as told by your health care provider.  Do not put any objects, including cotton swabs, into your ear. You can clean the opening of your ear canal with a washcloth or facial tissue.  Follow instructions from your health  care provider about cleaning your ears. Do not over-clean your ears.  Drink enough fluid to keep your urine clear or pale yellow. This will help to thin the earwax.  Keep all follow-up visits as told by your health care provider. If earwax builds up in your ears often or if you use hearing aids, consider seeing your health care provider for routine, preventive ear cleanings. Ask your health care provider how often you should schedule your cleanings.  If you have hearing aids, clean them according to instructions from the manufacturer and your health care provider. Contact a health care provider if:  You have ear pain.  You develop a fever.  You have blood, pus, or other fluid coming from your ear.  You have hearing loss.  You have ringing in your ears that does not go away.  Your symptoms do not improve with treatment.  You feel like the room is spinning (vertigo). Summary  Earwax can build up in the ear and cause discomfort or hearing loss.  The most common symptoms of this condition include reduced or muffled hearing and a feeling of fullness in the ear or feeling that the ear is plugged.  This condition may be diagnosed based on your symptoms, your medical history, and an ear exam.  This condition may be treated by using ear drops to soften the earwax or by having the earwax removed by a health care provider.  Do   not put any objects, including cotton swabs, into your ear. You can clean the opening of your ear canal with a washcloth or facial tissue. This information is not intended to replace advice given to you by your health care provider. Make sure you discuss any questions you have with your health care provider. Document Released: 05/03/2004 Document Revised: 06/06/2016 Document Reviewed: 06/06/2016 Elsevier Interactive Patient Education  2018 [employer] Inc[employer]  

## 2017-05-14 ENCOUNTER — Encounter: Payer: BLUE CROSS/BLUE SHIELD | Admitting: Internal Medicine

## 2017-06-11 ENCOUNTER — Ambulatory Visit: Payer: BLUE CROSS/BLUE SHIELD | Admitting: Internal Medicine

## 2017-06-11 ENCOUNTER — Encounter: Payer: Self-pay | Admitting: Internal Medicine

## 2017-06-11 VITALS — BP 114/70 | HR 76 | Temp 97.8°F | Wt 159.0 lb

## 2017-06-11 DIAGNOSIS — J3089 Other allergic rhinitis: Secondary | ICD-10-CM

## 2017-06-11 MED ORDER — CETIRIZINE HCL 10 MG PO TABS: 10.0000 mg | ORAL_TABLET | Freq: Every day | ORAL | 11 refills | Status: DC

## 2017-06-11 MED ORDER — FLUTICASONE PROPIONATE 50 MCG/ACT NA SUSP: 2.0000 | Freq: Every day | NASAL | 6 refills | Status: DC

## 2017-06-11 NOTE — Patient Instructions (Signed)

## 2017-06-11 NOTE — Progress Notes (Signed)
Subjective:    Patient ID: Christopher Murphy, male    DOB: 02-Oct-1945, 72 y.o.   MRN: [id number]  HPI  Pt presents to the clinic today with c/o runny nose and cough. He reports this started 1 month ago. He is blowing clear mucous out of his nose. The cough is productive at times, but he is not sure of the color of the mucous because he does not look at it. He denies fever, chills, body aches. He denies ear pain, nasal congestion, sore throat or shortness of breath. He has tried cough medicine OTC with minimal relief. He has had sick contacts.  Review of Systems      Past Medical History:  Diagnosis Date  . Blood clots in brain 2009  . Stroke [facility])     Current Outpatient Medications  Medication Sig Dispense Refill  . aspirin EC 81 MG EC tablet Take 1 tablet (81 mg total) by mouth daily. 30 tablet 0  . clopidogrel (PLAVIX) 75 MG tablet Take 1 tablet (75 mg total) by mouth daily. 180 tablet 1  . Folic Acid 5 MG CAPS Take 5 mg by mouth daily.     . memantine (NAMENDA) 5 MG tablet Take 1 tablet (5 mg total) by mouth 2 (two) times daily. 360 tablet 1  . Polyethyl Glycol-Propyl Glycol (SYSTANE) 0.4-0.3 % SOLN Apply 1 drop to eye 2 (two) times daily. 30 mL 5  . rosuvastatin (CRESTOR) 20 MG tablet Take 1 tablet (20 mg total) by mouth daily. 180 tablet 1   No current facility-administered medications for this visit.     No Known Allergies  Family History  Problem Relation Age of Onset  . AAA (abdominal aortic aneurysm) Neg Hx     Social History   Socioeconomic History  . Marital status: Married    Spouse name: Not on file  . Number of children: Not on file  . Years of education: Not on file  . Highest education level: Not on file  Social Needs  . Financial resource strain: Not on file  . Food insecurity - worry: Not on file  . Food insecurity - inability: Not on file  . Transportation needs - medical: Not on file  . Transportation needs - non-medical: Not on file    Occupational History  . Not on file  Tobacco Use  . Smoking status: Never Smoker  . Smokeless tobacco: Never Used  Substance and Sexual Activity  . Alcohol use: No  . Drug use: No  . Sexual activity: Not on file  Other Topics Concern  . Not on file  Social History Narrative  . Not on file     Constitutional: Denies fever, malaise, fatigue, headache or abrupt weight changes.  HEENT: Pt reports runny nose.Denies eye pain, eye redness, ear pain, ringing in the ears, wax buildup, nasal congestion, bloody nose, or sore throat. Respiratory: Pt reports cough. Denies difficulty breathing, shortness of breath, or sputum production.     No other specific complaints in a complete review of systems (except as listed in HPI above).  Objective:   Physical Exam   BP 114/70   Pulse 76   Temp 97.8 F (36.6 C) (Oral)   Wt 159 lb (72.1 kg)   SpO2 100%   BMI 24.18 kg/m  Wt Readings from Last 3 Encounters:  06/11/17 159 lb (72.1 kg)  10/03/16 155 lb (70.3 kg)  09/14/16 155 lb 12 oz (70.6 kg)    General: Appears his stated  age, well developed, well nourished in NAD. HEENT: Head: normal shape and size, no sinus tenderness  noted;  Nose: mucosa pink and moist, septum midline; Throat/Mouth: Teeth present, mucosa pink and moist, + PND,no exudate, lesions or ulcerations noted.  Neck:  No adenopathy noted Cardiovascular: Normal rate and rhythm. S1,S2 noted.  No murmur, rubs or gallops noted. Pulmonary/Chest: Normal effort and positive vesicular breath sounds. No respiratory distress. No wheezes, rales or ronchi noted.    BMET    Component Value Date/Time   NA 133 (L) 04/05/2016 1501   K 4.2 04/05/2016 1501   CL 102 04/05/2016 1501   CO2 22 04/05/2016 1501   GLUCOSE 101 (H) 04/05/2016 1501   BUN 14 04/05/2016 1501   CREATININE 1.02 04/05/2016 1501   CREATININE 0.93 02/21/2015 1008   CALCIUM 9.0 04/05/2016 1501   GFRNONAA >60 04/05/2016 1501   GFRNONAA 83 02/21/2015 1008   GFRAA >60  04/05/2016 1501   GFRAA >89 02/21/2015 1008    Lipid Panel     Component Value Date/Time   CHOL 83 04/06/2016 0828   TRIG 45 04/06/2016 0828   HDL 37 (L) 04/06/2016 0828   CHOLHDL 2.2 04/06/2016 0828   VLDL 9 04/06/2016 0828   LDLCALC 37 04/06/2016 0828    CBC    Component Value Date/Time   WBC 15.9 (H) 04/05/2016 1501   RBC 4.84 04/05/2016 1501   HGB 13.2 04/05/2016 1501   HCT 38.1 (L) 04/05/2016 1501   PLT 289 04/05/2016 1501   MCV 78.8 (L) 04/05/2016 1501   MCH 27.2 04/05/2016 1501   MCHC 34.5 04/05/2016 1501   RDW 13.4 04/05/2016 1501   LYMPHSABS 0.8 (L) 04/05/2016 1501   MONOABS 0.8 04/05/2016 1501   EOSABS 0.2 04/05/2016 1501   BASOSABS 0.3 (H) 04/05/2016 1501    Hgb A1C Lab Results  Component Value Date   HGBA1C 5.4 04/05/2016           Assessment & Plan:   Allergic Rhinitis:  Start Zyrtec and Flonase OTC  Return precautions discussed Webb Silversmith, NP

## 2017-06-17 ENCOUNTER — Encounter: Payer: Self-pay | Admitting: Internal Medicine

## 2017-06-17 ENCOUNTER — Ambulatory Visit (INDEPENDENT_AMBULATORY_CARE_PROVIDER_SITE_OTHER): Payer: BLUE CROSS/BLUE SHIELD | Admitting: Internal Medicine

## 2017-06-17 VITALS — BP 112/68 | HR 76 | Temp 98.2°F | Ht 68.0 in | Wt 158.0 lb

## 2017-06-17 DIAGNOSIS — Z0000 Encounter for general adult medical examination without abnormal findings: Secondary | ICD-10-CM | POA: Diagnosis not present

## 2017-06-17 DIAGNOSIS — Z125 Encounter for screening for malignant neoplasm of prostate: Secondary | ICD-10-CM | POA: Diagnosis not present

## 2017-06-17 DIAGNOSIS — Z23 Encounter for immunization: Secondary | ICD-10-CM

## 2017-06-17 DIAGNOSIS — Z1159 Encounter for screening for other viral diseases: Secondary | ICD-10-CM | POA: Diagnosis not present

## 2017-06-17 DIAGNOSIS — G459 Transient cerebral ischemic attack, unspecified: Secondary | ICD-10-CM

## 2017-06-17 LAB — LIPID PANEL
Cholesterol: 100 mg/dL (ref 0–200)
HDL: 31.3 mg/dL — ABNORMAL LOW (ref >39.00)
NonHDL: 68.24
Total CHOL/HDL Ratio: 3
Triglycerides: 234 mg/dL — ABNORMAL HIGH (ref 0.0–149.0)
VLDL: 46.8 mg/dL — ABNORMAL HIGH (ref 0.0–40.0)

## 2017-06-17 LAB — COMPREHENSIVE METABOLIC PANEL
ALT: 11 U/L (ref 0–53)
AST: 12 U/L (ref 0–37)
Albumin: 3.9 g/dL (ref 3.5–5.2)
Alkaline Phosphatase: 56 U/L (ref 39–117)
BUN: 13 mg/dL (ref 6–23)
CO2: 31 mEq/L (ref 19–32)
Calcium: 9.4 mg/dL (ref 8.4–10.5)
Chloride: 100 mEq/L (ref 96–112)
Creatinine, Ser: 0.83 mg/dL (ref 0.40–1.50)
GFR: 96.91 mL/min (ref >60.00)
Glucose, Bld: 97 mg/dL (ref 70–99)
Potassium: 4.5 mEq/L (ref 3.5–5.1)
Sodium: 135 mEq/L (ref 135–145)
Total Bilirubin: 0.3 mg/dL (ref 0.2–1.2)
Total Protein: 7.2 g/dL (ref 6.0–8.3)

## 2017-06-17 LAB — CBC
HCT: 38.2 % — ABNORMAL LOW (ref 39.0–52.0)
Hemoglobin: 12.7 g/dL — ABNORMAL LOW (ref 13.0–17.0)
MCHC: 33.2 g/dL (ref 30.0–36.0)
MCV: 81.8 fl (ref 78.0–100.0)
Platelets: 311 10*3/uL (ref 150.0–400.0)
RBC: 4.68 Mil/uL (ref 4.22–5.81)
RDW: 13.5 % (ref 11.5–15.5)
WBC: 7 10*3/uL (ref 4.0–10.5)

## 2017-06-17 LAB — LDL CHOLESTEROL, DIRECT: Direct LDL: 45 mg/dL

## 2017-06-17 NOTE — Patient Instructions (Signed)

## 2017-06-17 NOTE — Progress Notes (Signed)
Subjective:    Patient ID: Christopher Murphy, male    DOB: 1945-04-13, 72 y.o.   MRN: [id number]  HPI  Pt presents to the clinic today for his annual exam.  History of Stroke: Managed on ASA, Plavix and Rosuvastatin. 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 He is taking Namenda as prescribed. He follows with neurology.  Flu: 03/2016 Tetanus: 02/2015 Pneumovax: 02/2015 Prevnar: never Zostovax: never Shingrix: never Colon Screening: never Vision Screening: annually Dentist: as needed  Diet: He does not eat meat. He consumes fruits and veggies daily. He tries to avoid fried foods. Exercise: None  Review of Systems  Past Medical History:  Diagnosis Date  . Blood clots in brain 2009  . Stroke [facility])     Current Outpatient Medications  Medication Sig Dispense Refill  . aspirin EC 81 MG EC tablet Take 1 tablet (81 mg total) by mouth daily. 30 tablet 0  . cetirizine (ZYRTEC) 10 MG tablet Take 1 tablet (10 mg total) by mouth daily. 30 tablet 11  . clopidogrel (PLAVIX) 75 MG tablet Take 1 tablet (75 mg total) by mouth daily. 180 tablet 1  . fluticasone (FLONASE) 50 MCG/ACT nasal spray Place 2 sprays into both nostrils daily. 16 g 6  . Folic Acid 5 MG CAPS Take 5 mg by mouth daily.     . memantine (NAMENDA) 5 MG tablet Take 1 tablet (5 mg total) by mouth 2 (two) times daily. 360 tablet 1  . Polyethyl Glycol-Propyl Glycol (SYSTANE) 0.4-0.3 % SOLN Apply 1 drop to eye 2 (two) times daily. 30 mL 5  . rosuvastatin (CRESTOR) 20 MG tablet Take 1 tablet (20 mg total) by mouth daily. 180 tablet 1   No current facility-administered medications for this visit.     No Known Allergies  Family History  Problem Relation Age of Onset  . AAA (abdominal aortic aneurysm) Neg Hx     Social History   Socioeconomic History  . Marital status: Married    Spouse name: Not on file  . Number of children: Not on file  . Years of education: Not on file  . Highest education level: Not on  file  Social Needs  . Financial resource strain: Not on file  . Food insecurity - worry: Not on file  . Food insecurity - inability: Not on file  . Transportation needs - medical: Not on file  . Transportation needs - non-medical: Not on file  Occupational History  . Not on file  Tobacco Use  . Smoking status: Never Smoker  . Smokeless tobacco: Never Used  Substance and Sexual Activity  . Alcohol use: No  . Drug use: No  . Sexual activity: Not on file  Other Topics Concern  . Not on file  Social History Narrative  . Not on file     Constitutional: Denies fever, malaise, fatigue, headache or abrupt weight changes.  HEENT: Pt reports difficulty hearing. Denies eye pain, eye redness, ear pain, ringing in the ears, wax buildup, runny nose, nasal congestion, bloody nose, or sore throat. Respiratory: Denies difficulty breathing, shortness of breath, cough or sputum production.   Cardiovascular: Denies chest pain, chest tightness, palpitations or swelling in the hands or feet.  Gastrointestinal: Pt reports intermittent constipation. Denies abdominal pain, bloating,  diarrhea or blood in the stool.  GU: Denies urgency, frequency, pain with urination, burning sensation, blood in urine, odor or discharge. Musculoskeletal: Denies decrease in range of motion, difficulty with gait, muscle pain  or joint pain and swelling.  Skin: Denies redness, rashes, lesions or ulcercations.  Neurological: Pt reports difficulty with memory. Denies dizziness, difficulty with speech or problems with balance and coordination.  Psych: Denies anxiety, depression, SI/HI.  No other specific complaints in a complete review of systems (except as listed in HPI above).     Objective:   Physical Exam   BP 112/68   Pulse 76   Temp 98.2 F (36.8 C) (Oral)   Ht 5\' 8"  (1.727 m)   Wt 158 lb (71.7 kg)   SpO2 98%   BMI 24.02 kg/m  Wt Readings from Last 3 Encounters:  06/17/17 158 lb (71.7 kg)  06/11/17 159 lb  (72.1 kg)  10/03/16 155 lb (70.3 kg)    General: Appears his stated age, well developed, well nourished in NAD. Skin: Warm, dry and intact.  HEENT: Head: normal shape and size; Eyes: sclera white, no icterus, conjunctiva pink, PERRLA and EOMs intact; Throat/Mouth: Teeth present, mucosa pink and moist, no exudate, lesions or ulcerations noted.  Neck:  Neck supple, trachea midline. No masses, lumps or thyromegaly present.  Cardiovascular: Normal rate and rhythm. S1,S2 noted.  No murmur, rubs or gallops noted. No JVD or BLE edema.  Pulmonary/Chest: Normal effort and positive vesicular breath sounds. No respiratory distress. No wheezes, rales or ronchi noted.  Abdomen: Soft and nontender. Normal bowel sounds. No distention or masses noted. Liver, spleen and kidneys non palpable. Musculoskeletal: Strength 5/5 BUE/BLE. No difficulty with gait.  Neurological: Alert and oriented. Cranial nerves II-XII grossly intact. Coordination normal.  Psychiatric: Mood and affect normal. Behavior is normal. Judgment and thought content normal.    BMET    Component Value Date/Time   NA 133 (L) 04/05/2016 1501   K 4.2 04/05/2016 1501   CL 102 04/05/2016 1501   CO2 22 04/05/2016 1501   GLUCOSE 101 (H) 04/05/2016 1501   BUN 14 04/05/2016 1501   CREATININE 1.02 04/05/2016 1501   CREATININE 0.93 02/21/2015 1008   CALCIUM 9.0 04/05/2016 1501   GFRNONAA >60 04/05/2016 1501   GFRNONAA 83 02/21/2015 1008   GFRAA >60 04/05/2016 1501   GFRAA >89 02/21/2015 1008    Lipid Panel     Component Value Date/Time   CHOL 83 04/06/2016 0828   TRIG 45 04/06/2016 0828   HDL 37 (L) 04/06/2016 0828   CHOLHDL 2.2 04/06/2016 0828   VLDL 9 04/06/2016 0828   LDLCALC 37 04/06/2016 0828    CBC    Component Value Date/Time   WBC 15.9 (H) 04/05/2016 1501   RBC 4.84 04/05/2016 1501   HGB 13.2 04/05/2016 1501   HCT 38.1 (L) 04/05/2016 1501   PLT 289 04/05/2016 1501   MCV 78.8 (L) 04/05/2016 1501   MCH 27.2 04/05/2016  1501   MCHC 34.5 04/05/2016 1501   RDW 13.4 04/05/2016 1501   LYMPHSABS 0.8 (L) 04/05/2016 1501   MONOABS 0.8 04/05/2016 1501   EOSABS 0.2 04/05/2016 1501   BASOSABS 0.3 (H) 04/05/2016 1501    Hgb A1C Lab Results  Component Value Date   HGBA1C 5.4 04/05/2016           Assessment & Plan:   Preventative Health Maintenance:  Flu and prevnar today Tetanus and pneumovax UTD He declines shingles vaccine He declines colon cancer screening Encouraged him to consume a balanced diet and exercise regimen Advised him to see an eye doctor and dentist annually Will check CBC, CMET, Lipid, PSA and Hep C today  RTC in  1 year, sooner if needed Webb Silversmith, NP

## 2017-06-17 NOTE — Assessment & Plan Note (Signed)
Managed with ASA, Plavix, Rosuvastatin and Namenda He will continue to follow with neurology

## 2017-06-18 LAB — HEPATITIS C ANTIBODY
Hepatitis C Ab: NONREACTIVE
SIGNAL TO CUT-OFF: 0.06 (ref <1.00)

## 2017-06-18 LAB — PSA: PSA: 2.41 ng/mL (ref 0.10–4.00)

## 2017-06-19 NOTE — Addendum Note (Signed)
Addended by: Lurlean Nanny on: 06/19/2017 09:23 AM   Modules accepted: Orders

## 2018-02-03 ENCOUNTER — Encounter: Payer: Self-pay | Admitting: Internal Medicine

## 2018-02-03 ENCOUNTER — Ambulatory Visit: Payer: BLUE CROSS/BLUE SHIELD | Admitting: Internal Medicine

## 2018-02-03 VITALS — BP 108/70 | HR 82 | Temp 97.8°F | Wt 156.0 lb

## 2018-02-03 DIAGNOSIS — Z8673 Personal history of transient ischemic attack (TIA), and cerebral infarction without residual deficits: Secondary | ICD-10-CM

## 2018-02-03 DIAGNOSIS — J302 Other seasonal allergic rhinitis: Secondary | ICD-10-CM

## 2018-02-03 DIAGNOSIS — H6123 Impacted cerumen, bilateral: Secondary | ICD-10-CM | POA: Diagnosis not present

## 2018-02-03 NOTE — Patient Instructions (Signed)
Earwax Buildup, Adult The ears produce a substance called earwax that helps keep bacteria out of the ear and protects the skin in the ear canal. Occasionally, earwax can build up in the ear and cause discomfort or hearing loss. What increases the risk? This condition is more likely to develop in people who:  Are male.  Are elderly.  Naturally produce more earwax.  Clean their ears often with cotton swabs.  Use earplugs often.  Use in-ear headphones often.  Wear hearing aids.  Have narrow ear canals.  Have earwax that is overly thick or sticky.  Have eczema.  Are dehydrated.  Have excess hair in the ear canal.  What are the signs or symptoms? Symptoms of this condition include:  Reduced or muffled hearing.  A feeling of fullness in the ear or feeling that the ear is plugged.  Fluid coming from the ear.  Ear pain.  Ear itch.  Ringing in the ear.  Coughing.  An obvious piece of earwax that can be seen inside the ear canal.  How is this diagnosed? This condition may be diagnosed based on:  Your symptoms.  Your medical history.  An ear exam. During the exam, your health care provider will look into your ear with an instrument called an otoscope.  You may have tests, including a hearing test. How is this treated? This condition may be treated by:  Using ear drops to soften the earwax.  Having the earwax removed by a health care provider. The health care provider may: ? Flush the ear with water. ? Use an instrument that has a loop on the end (curette). ? Use a suction device.  Surgery to remove the wax buildup. This may be done in severe cases.  Follow these instructions at home:  Take over-the-counter and prescription medicines only as told by your health care provider.  Do not put any objects, including cotton swabs, into your ear. You can clean the opening of your ear canal with a washcloth or facial tissue.  Follow instructions from your health  care provider about cleaning your ears. Do not over-clean your ears.  Drink enough fluid to keep your urine clear or pale yellow. This will help to thin the earwax.  Keep all follow-up visits as told by your health care provider. If earwax builds up in your ears often or if you use hearing aids, consider seeing your health care provider for routine, preventive ear cleanings. Ask your health care provider how often you should schedule your cleanings.  If you have hearing aids, clean them according to instructions from the manufacturer and your health care provider. Contact a health care provider if:  You have ear pain.  You develop a fever.  You have blood, pus, or other fluid coming from your ear.  You have hearing loss.  You have ringing in your ears that does not go away.  Your symptoms do not improve with treatment.  You feel like the room is spinning (vertigo). Summary  Earwax can build up in the ear and cause discomfort or hearing loss.  The most common symptoms of this condition include reduced or muffled hearing and a feeling of fullness in the ear or feeling that the ear is plugged.  This condition may be diagnosed based on your symptoms, your medical history, and an ear exam.  This condition may be treated by using ear drops to soften the earwax or by having the earwax removed by a health care provider.  Do   not put any objects, including cotton swabs, into your ear. You can clean the opening of your ear canal with a washcloth or facial tissue. This information is not intended to replace advice given to you by your health care provider. Make sure you discuss any questions you have with your health care provider. Document Released: 05/03/2004 Document Revised: 06/06/2016 Document Reviewed: 06/06/2016 Elsevier Interactive Patient Education  2018 [employer] Inc[employer]  

## 2018-02-03 NOTE — Progress Notes (Addendum)
Subjective:    Patient ID: Christopher Murphy, male    DOB: 1945-04-14, 72 y.o.   MRN: [id number]  HPI  Pt presents to the clinic today with c/o bilateral cerumen impaction. He periodically has wax buildup and needs to come have his ear flushed. He denies decreased hearing or ear pain.   He also reports nasal congestion, post nasal drip and cough. This started about 1-2 weeks ago. He is not blowing anything out of his nose. He denies difficulty swallowing. The cough is non productive. He denies fever, chills, body aches or SOB. He has not taken anything OTC for his symptoms. He has a history of allergies. He has had sick contacts.  He also needs a referral to neurology for follow up s/p stroke. He wants to see Dr. Alveta Heimlich.  Review of Systems  Past Medical History:  Diagnosis Date  . Blood clots in brain 2009  . Stroke [facility])     Current Outpatient Medications  Medication Sig Dispense Refill  . aspirin EC 81 MG EC tablet Take 1 tablet (81 mg total) by mouth daily. 30 tablet 0  . cetirizine (ZYRTEC) 10 MG tablet Take 1 tablet (10 mg total) by mouth daily. 30 tablet 11  . clopidogrel (PLAVIX) 75 MG tablet Take 1 tablet (75 mg total) by mouth daily. 180 tablet 1  . fluticasone (FLONASE) 50 MCG/ACT nasal spray Place 2 sprays into both nostrils daily. 16 g 6  . Folic Acid 5 MG CAPS Take 5 mg by mouth daily.     . memantine (NAMENDA) 5 MG tablet Take 1 tablet (5 mg total) by mouth 2 (two) times daily. 360 tablet 1  . Polyethyl Glycol-Propyl Glycol (SYSTANE) 0.4-0.3 % SOLN Apply 1 drop to eye 2 (two) times daily. 30 mL 5  . rosuvastatin (CRESTOR) 20 MG tablet Take 1 tablet (20 mg total) by mouth daily. 180 tablet 1   No current facility-administered medications for this visit.     No Known Allergies  Family History  Problem Relation Age of Onset  . AAA (abdominal aortic aneurysm) Neg Hx     Social History   Socioeconomic History  . Marital status: Married    Spouse name: Not  on file  . Number of children: Not on file  . Years of education: Not on file  . Highest education level: Not on file  Occupational History  . Not on file  Social Needs  . Financial resource strain: Not on file  . Food insecurity:    Worry: Not on file    Inability: Not on file  . Transportation needs:    Medical: Not on file    Non-medical: Not on file  Tobacco Use  . Smoking status: Never Smoker  . Smokeless tobacco: Never Used  Substance and Sexual Activity  . Alcohol use: No  . Drug use: No  . Sexual activity: Not on file  Lifestyle  . Physical activity:    Days per week: Not on file    Minutes per session: Not on file  . Stress: Not on file  Relationships  . Social connections:    Talks on phone: Not on file    Gets together: Not on file    Attends religious service: Not on file    Active member of club or organization: Not on file    Attends meetings of clubs or organizations: Not on file    Relationship status: Not on file  . Intimate partner violence:  Fear of current or ex partner: Not on file    Emotionally abused: Not on file    Physically abused: Not on file    Forced sexual activity: Not on file  Other Topics Concern  . Not on file  Social History Narrative  . Not on file     Constitutional: Denies fever, malaise, fatigue, headache or abrupt weight changes.  HEENT: Pt reports bilateral cerumen impaction, nasal congestion. Denies eye pain, eye redness, ear pain, ringing in the ears, runny nose, bloody nose, or sore throat. Respiratory: Pt reports cough. Denies difficulty breathing, shortness of breath, or sputum production.   Cardiovascular: Denies chest pain, chest tightness, palpitations or swelling in the hands or feet.   No other specific complaints in a complete review of systems (except as listed in HPI above).     Objective:   Physical Exam   BP 108/70   Pulse 82   Temp 97.8 F (36.6 C) (Oral)   Wt 156 lb (70.8 kg)   SpO2 98%   BMI  23.72 kg/m  Wt Readings from Last 3 Encounters:  02/03/18 156 lb (70.8 kg)  06/17/17 158 lb (71.7 kg)  06/11/17 159 lb (72.1 kg)    General: Appears his stated age, well developed, well nourished in NAD. HEENT: Ears: Moderate cerumen bilaterally; Nose: mucosa pink and moist, septum midline; Throat/Mouth: Teeth present, mucosa pink and moist, + PND, no exudate, lesions or ulcerations noted.  Neck:  Neck supple, trachea midline. No masses, lumps or thyromegaly present.  Cardiovascular: Normal rate and rhythm. S1,S2 noted.  No murmur, rubs or gallops noted.  Pulmonary/Chest: Normal effort and positive vesicular breath sounds. No respiratory distress. No wheezes, rales or ronchi noted.   BMET    Component Value Date/Time   NA 135 06/17/2017 1452   K 4.5 06/17/2017 1452   CL 100 06/17/2017 1452   CO2 31 06/17/2017 1452   GLUCOSE 97 06/17/2017 1452   BUN 13 06/17/2017 1452   CREATININE 0.83 06/17/2017 1452   CREATININE 0.93 02/21/2015 1008   CALCIUM 9.4 06/17/2017 1452   GFRNONAA >60 04/05/2016 1501   GFRNONAA 83 02/21/2015 1008   GFRAA >60 04/05/2016 1501   GFRAA >89 02/21/2015 1008    Lipid Panel     Component Value Date/Time   CHOL 100 06/17/2017 1452   TRIG 234.0 (H) 06/17/2017 1452   HDL 31.30 (L) 06/17/2017 1452   CHOLHDL 3 06/17/2017 1452   VLDL 46.8 (H) 06/17/2017 1452   LDLCALC 37 04/06/2016 0828    CBC    Component Value Date/Time   WBC 7.0 06/17/2017 1452   RBC 4.68 06/17/2017 1452   HGB 12.7 (L) 06/17/2017 1452   HCT 38.2 (L) 06/17/2017 1452   PLT 311.0 06/17/2017 1452   MCV 81.8 06/17/2017 1452   MCH 27.2 04/05/2016 1501   MCHC 33.2 06/17/2017 1452   RDW 13.5 06/17/2017 1452   LYMPHSABS 0.8 (L) 04/05/2016 1501   MONOABS 0.8 04/05/2016 1501   EOSABS 0.2 04/05/2016 1501   BASOSABS 0.3 (H) 04/05/2016 1501    Hgb A1C Lab Results  Component Value Date   HGBA1C 5.4 04/05/2016           Assessment & Plan:   Bilateral Cerumen  Impaction:  Manual lavage by CMA Advised him to try Debrox 2 x week to prevent wax buildup  Allergic Rhintis:  Start Zyrtec and Flonase OTC  Return precautions discussed Webb Silversmith, NP

## 2018-02-03 NOTE — Addendum Note (Signed)
Addended by: [name] on: 02/03/2018 10:08 AM   Modules accepted: Orders

## 2018-02-06 ENCOUNTER — Telehealth: Payer: Self-pay

## 2018-02-06 NOTE — Telephone Encounter (Signed)
Left message for patient to call back in regards to referrals-Esli Jernigan V Yemaya Barnier, RMA

## 2018-02-26 ENCOUNTER — Ambulatory Visit: Payer: BLUE CROSS/BLUE SHIELD | Admitting: Neurology

## 2018-03-11 ENCOUNTER — Encounter: Payer: Self-pay | Admitting: Neurology

## 2018-03-11 ENCOUNTER — Ambulatory Visit: Payer: BLUE CROSS/BLUE SHIELD | Admitting: Neurology

## 2018-03-11 VITALS — BP 115/69 | HR 81 | Ht 68.0 in | Wt 161.4 lb

## 2018-03-11 DIAGNOSIS — G40009 Localization-related (focal) (partial) idiopathic epilepsy and epileptic syndromes with seizures of localized onset, not intractable, without status epilepticus: Secondary | ICD-10-CM

## 2018-03-11 DIAGNOSIS — I6990 Unspecified sequelae of unspecified cerebrovascular disease: Secondary | ICD-10-CM

## 2018-03-11 DIAGNOSIS — G3184 Mild cognitive impairment, so stated: Secondary | ICD-10-CM

## 2018-03-11 DIAGNOSIS — R4789 Other speech disturbances: Secondary | ICD-10-CM

## 2018-03-11 MED ORDER — DIVALPROEX SODIUM ER 500 MG PO TB24: 500.0000 mg | ORAL_TABLET | Freq: Every day | ORAL | 3 refills | Status: DC

## 2018-03-11 NOTE — Patient Instructions (Signed)
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  I recommend a trial of Depakote ER 500 mg daily.  Have discussed possible side effects with them and advised him to call me if needed.  Check EEG, lipid profile, hemoglobin A1c, MRI scan of the brain and MRA of the brain and neck.  He will remain on on Namenda 5 mg twice daily for his mild dementia which appears stable and on aspirin and Plavix for stroke prevention along with strict control of lipids with LDL cholesterol goal below 70 mg percent and blood pressure goal below 130/90  Patient plans to travel to [place] for several months.  He will return for follow-up after his return from there hence no scheduled follow-up appointment was made today.

## 2018-03-11 NOTE — Progress Notes (Signed)
[facility] [address] [place]. [place] [address] [phone]       OFFICE CONSULT NOTE  Christopher. Christopher Murphy Date of Birth:  April 22, 1945 Medical Record Number:  [id number]   Referring MD: Webb Silversmith, NP Reason for Referral: TIA HPI: Christopher Murphy is a pleasant 72 year old [place] Sikh male who seen today for initial office consultation visit.  He is accompanied by his son and daughter-in-law who provide most of the history.  I have reviewed imaging films in PACS.  The patient has had recurrent transient episodes of speech difficulties for the last 10 years.  The episodes are stereotypical.  The patient struggles to speak at times and get words out.  At times his speech is gibberish and nonsensical.  These typically last only few minutes around 5-10 or so but he has had a couple episodes which lasted for an hour or longer.  During these prolonged episodes he is been found to be confused and disoriented.  Interestingly patient had a similar episode on 04/05/2016 when he was admitted to [facility].  He had MRI scan of the brain at that time which I personally reviewed and shows interesting diffuse hyperintensity throughout the cerebral white matter raising concern for seizures.  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  EEG was apparently not done.  Patient was not started on seizure medications.  Patient has history of left posterior cerebral artery infarct in 2009 in [place].  He was placed on a combination tablet of aspirin and Plavix as well as medication for lipids.  He had cognitive impairment and mild memory loss and was started on Namenda as well.  He is presently on Namenda 5 mg twice daily.  Family states that he is cognitively stable and does have short-term memory difficulties but long-term memory is good.  He is living at home with family.  He needs  only mild help with activities of daily living.  He can ambulate independently.  He has not had any witnessed generalized tonic-clonic seizure significant head injury or loss of consciousness.  The patient actually had gone to [place] following this episode in December 2017 for nearly 3 years before he came back.  He was seen by neurologist there but details are not known.  He was never placed on seizure medications.  ROS:   14 system review of systems is positive for speech difficulties, memory loss, confusion and all other systems negative  PMH:  Past Medical History:  Diagnosis Date  . Blood clots in brain 2009  . Stroke [facility])     Social History:  Social History   Socioeconomic History  . Marital status: Married    Spouse name: Not on file  . Number of children: Not on file  . Years of education: Not on file  . Highest education level: Not on file  Occupational History  . Not on file  Social Needs  . Financial resource strain: Not on file  . Food insecurity:    Worry: Not on file    Inability: Not on file  . Transportation needs:    Medical: Not on file    Non-medical: Not on file  Tobacco Use  . Smoking status: Never Smoker  . Smokeless tobacco: Never Used  Substance and Sexual Activity  . Alcohol use: No  . Drug use: No  . Sexual activity: Not on file  Lifestyle  .  Physical activity:    Days per week: Not on file    Minutes per session: Not on file  . Stress: Not on file  Relationships  . Social connections:    Talks on phone: Not on file    Gets together: Not on file    Attends religious service: Not on file    Active member of club or organization: Not on file    Attends meetings of clubs or organizations: Not on file    Relationship status: Not on file  . Intimate partner violence:    Fear of current or ex partner: Not on file    Emotionally abused: Not on file    Physically abused: Not on file    Forced sexual activity: Not on file  Other Topics Concern    . Not on file  Social History Narrative  . Not on file    Medications:   Current Outpatient Medications on File Prior to Visit  Medication Sig Dispense Refill  . aspirin EC 81 MG EC tablet Take 1 tablet (81 mg total) by mouth daily. 30 tablet 0  . cetirizine (ZYRTEC) 10 MG tablet Take 1 tablet (10 mg total) by mouth daily. 30 tablet 11  . clopidogrel (PLAVIX) 75 MG tablet Take 1 tablet (75 mg total) by mouth daily. 180 tablet 1  . fluticasone (FLONASE) 50 MCG/ACT nasal spray Place 2 sprays into both nostrils daily. 16 g 6  . Folic Acid 5 MG CAPS Take 5 mg by mouth daily.     . memantine (NAMENDA) 5 MG tablet Take 1 tablet (5 mg total) by mouth 2 (two) times daily. 360 tablet 1  . Polyethyl Glycol-Propyl Glycol (SYSTANE) 0.4-0.3 % SOLN Apply 1 drop to eye 2 (two) times daily. 30 mL 5  . rosuvastatin (CRESTOR) 20 MG tablet Take 1 tablet (20 mg total) by mouth daily. 180 tablet 1   No current facility-administered medications on file prior to visit.     Allergies:  No Known Allergies  Physical Exam General: Frail elderly male, seated, in no evident distress Head: head normocephalic and atraumatic.   Neck: supple with no carotid or supraclavicular bruits Cardiovascular: regular rate and rhythm, soft ejection systolic murmur. Musculoskeletal: no deformity Skin:  no rash/petichiae Vascular:  Normal pulses all extremities  Neurologic Exam Mental Status: Awake and fully alert. Oriented to place and time. Recent and remote memory diminished. Attention span, concentration and fund of knowledge slightly reduced.  Mini-Mental status exam not done.  Diminished recall 1/3.  Mood and affect appropriate.  Cranial Nerves: Fundoscopic exam reveals sharp disc margins. Pupils equal, briskly reactive to light. Extraocular movements full without nystagmus mild mechanical ptosis of the left eye.. Visual fields full to confrontation. Hearing intact. Facial sensation intact. Face, tongue, palate moves  normally and symmetrically.  Motor: Normal bulk and tone. Normal strength in all tested extremity muscles. Sensory.: intact to touch , pinprick , position and vibratory sensation.  Coordination: Rapid alternating movements normal in all extremities. Finger-to-nose and heel-to-shin performed accurately bilaterally. Gait and Station: Arises from chair without difficulty. Stance is normal. Gait demonstrates normal stride length and balance .  Drags right leg slightly while walking.  Reflexes: 1+ and symmetric. Toes downgoing.       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  Primary progressive aphasia and left hemispheric TIA possible  though less likely.  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  Vascular risk factors of hyperlipidemia, age and cerebrovascular disease.     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  I recommend a trial of Depakote ER 500 mg daily.  Have discussed possible side effects with them and advised him to call me if needed.  Check EEG, lipid profile, hemoglobin A1c, MRI scan of the brain and MRA of the brain and neck.  He will remain on on Namenda 5 mg twice daily for his mild dementia which appears stable and on aspirin and Plavix for stroke prevention along with strict control of lipids with LDL cholesterol goal below 70 mg percent and blood pressure goal below 130/90  Patient plans to travel to [place] for several months.  Greater than 50% time during this 45-minute  consultation visit was spent on counseling and coordination of care about his episodes of recurrent speech difficulties, remote TIA and mild dementia and answering questions.  He will return for follow-up after his return from there hence no scheduled follow-up appointment was made today. Antony Contras, MD  [facility] [address] [place], [place] [address]  Phone [phone] Fax [phone] Note: This document was prepared with digital dictation and possible smart phrase technology. Any transcriptional errors that result from this process are unintentional.

## 2018-03-12 ENCOUNTER — Telehealth: Payer: Self-pay | Admitting: Neurology

## 2018-03-12 LAB — LIPID PANEL
Chol/HDL Ratio: 3.4 ratio (ref 0.0–5.0)
Cholesterol, Total: 114 mg/dL (ref 100–199)
HDL: 34 mg/dL — ABNORMAL LOW (ref >39)
LDL Calculated: 37 mg/dL (ref 0–99)
Triglycerides: 217 mg/dL — ABNORMAL HIGH (ref 0–149)
VLDL Cholesterol Cal: 43 mg/dL — ABNORMAL HIGH (ref 5–40)

## 2018-03-12 LAB — HEMOGLOBIN A1C
Est. average glucose Bld gHb Est-mCnc: 108 mg/dL
Hgb A1c MFr Bld: 5.4 % (ref 4.8–5.6)

## 2018-03-12 NOTE — Telephone Encounter (Signed)
[employer] Auth: [id number] (exp. 03/12/18 to 04/10/18) patient is scheduled at [place] at 03/31/18 arrival time is 2:45 pm. The patient daughter in law is aware of time & day.

## 2018-03-18 ENCOUNTER — Ambulatory Visit: Payer: BLUE CROSS/BLUE SHIELD

## 2018-03-18 ENCOUNTER — Other Ambulatory Visit: Payer: BLUE CROSS/BLUE SHIELD

## 2018-03-18 DIAGNOSIS — G40009 Localization-related (focal) (partial) idiopathic epilepsy and epileptic syndromes with seizures of localized onset, not intractable, without status epilepticus: Secondary | ICD-10-CM | POA: Diagnosis not present

## 2018-03-31 ENCOUNTER — Ambulatory Visit
Admission: RE | Admit: 2018-03-31 | Discharge: 2018-03-31 | Disposition: A | Discharge status: Home / Self Care | Payer: BLUE CROSS/BLUE SHIELD | Source: Ambulatory Visit | Attending: Neurology | Admitting: Neurology

## 2018-03-31 DIAGNOSIS — G40009 Localization-related (focal) (partial) idiopathic epilepsy and epileptic syndromes with seizures of localized onset, not intractable, without status epilepticus: Secondary | ICD-10-CM | POA: Insufficient documentation

## 2018-03-31 DIAGNOSIS — Z8673 Personal history of transient ischemic attack (TIA), and cerebral infarction without residual deficits: Secondary | ICD-10-CM | POA: Diagnosis not present

## 2018-03-31 LAB — POCT I-STAT CREATININE: Creatinine, Ser: 0.8 mg/dL (ref 0.61–1.24)

## 2018-03-31 MED ORDER — GADOBUTROL 1 MMOL/ML IV SOLN
7.0000 mL | Freq: Once | INTRAVENOUS | Status: AC | PRN
  Administered 2018-03-31: 7 mL via INTRAVENOUS

## 2018-03-31 MED ORDER — GADOBUTROL 1 MMOL/ML IV SOLN: 6.5000 mL | Freq: Once | INTRAVENOUS | Status: DC | PRN

## 2019-02-16 ENCOUNTER — Other Ambulatory Visit: Payer: Self-pay

## 2019-02-16 DIAGNOSIS — Z20822 Contact with and (suspected) exposure to covid-19: Secondary | ICD-10-CM

## 2019-02-17 LAB — NOVEL CORONAVIRUS, NAA: SARS-CoV-2, NAA: NOT DETECTED

## 2019-04-21 ENCOUNTER — Other Ambulatory Visit: Payer: Self-pay

## 2019-04-21 ENCOUNTER — Ambulatory Visit (INDEPENDENT_AMBULATORY_CARE_PROVIDER_SITE_OTHER): Payer: 59 | Admitting: Gastroenterology

## 2019-04-21 VITALS — BP 114/69 | HR 81 | Temp 97.9°F | Ht 68.0 in | Wt 154.8 lb

## 2019-04-21 DIAGNOSIS — K6289 Other specified diseases of anus and rectum: Secondary | ICD-10-CM

## 2019-04-21 DIAGNOSIS — R1312 Dysphagia, oropharyngeal phase: Secondary | ICD-10-CM | POA: Diagnosis not present

## 2019-04-21 NOTE — Progress Notes (Signed)
Christopher Bellows MD, MRCP(U.K) [address]  [place]  [place], [place] [address]  Main: [phone]  Fax: [phone]   Gastroenterology Consultation  Referring Provider:     Rolene Course, PA-C Primary Care Physician:  [name], NP Primary Gastroenterologist:  Dr. Jonathon Murphy  Reason for Consultation:     Rectal mass        HPI:   Christopher Murphy is a 74 y.o. y/o male referred for consultation & management  by Dr. Garnette Gunner, Coralie Keens, NP.    He has been referred for rectal mass and chronic constipation.  I see that the patient is undergoing a GI consultation that [facility] in December 2020.  History of hemorrhoidectomy performed in [place] 25 years back.  There is a concern of a mass protruding from the rectum which she could reduce manually during his recent hospitalization that was noticed at [facility] urgent care. Plan was to perform a colonoscopy.  Commenced on [employer].  He is here today with a family member who speaks [place].  This patient speaks limited [place].  Apparently for over a year he has had a mild protrusion from his rectum which pops out and popped back in.  No rectal bleeding.  History of chronic constipation which is well controlled on some Ayurveda supplements that he takes.  No weight loss no change in appetite.  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  Past Medical History:  Diagnosis Date  . Blood clots in brain 2009  . Stroke [facility])     No past surgical history on file.  Prior to Admission medications   Medication Sig Start Date End Date Taking? Authorizing Provider  aspirin EC 81 MG EC tablet Take 1 tablet (81 mg total) by mouth daily. 04/07/16   Hower, Aaron Mose, MD  cetirizine (ZYRTEC) 10 MG tablet Take 1 tablet (10 mg total) by mouth daily. 06/11/17   [name], NP  clopidogrel (PLAVIX) 75 MG tablet Take 1 tablet (75 mg total) by  mouth daily. 10/03/16   [name], NP  clopidogrel (PLAVIX) 75 MG tablet Take by mouth.    [provider]  divalproex (DEPAKOTE ER) 500 MG 24 hr tablet Take 1 tablet (500 mg total) by mouth daily. 03/11/18   Garvin Fila, MD  fluticasone (FLONASE) 50 MCG/ACT nasal spray Place 2 sprays into both nostrils daily. 06/11/17   [name], NP  Folic Acid 5 MG CAPS Take 5 mg by mouth daily.     [provider]  memantine (NAMENDA) 5 MG tablet Take 1 tablet (5 mg total) by mouth 2 (two) times daily. 10/03/16   [name], NP  memantine (NAMENDA) 5 MG tablet Take by mouth.    [provider]  Polyethyl Glycol-Propyl Glycol (SYSTANE) 0.4-0.3 % SOLN Apply 1 drop to eye 2 (two) times daily. 06/12/16   [name], NP  rosuvastatin (CRESTOR) 20 MG tablet Take 1 tablet (20 mg total) by mouth daily. 10/03/16   [name], NP    Family History  Problem Relation Age of Onset  . AAA (abdominal aortic aneurysm) Neg Hx      Social History   Tobacco Use  . Smoking status: Never Smoker  . Smokeless tobacco: Never Used  Substance Use Topics  . Alcohol use: No  . Drug use:  No    Allergies as of 04/21/2019  . (No Known Allergies)    Review of Systems:    All systems reviewed and negative except where noted in HPI.   Physical Exam:  There were no vitals taken for this visit. No LMP for male patient. Psych:  Alert and cooperative. Normal mood and affect. General:   Alert,  Well-developed, well-nourished, pleasant and cooperative in NAD Head:  Normocephalic and atraumatic. Eyes:  Sclera clear, no icterus.   Conjunctiva pink. Ears:  Normal auditory acuity. Lungs:  Respirations even and unlabored.  Clear throughout to auscultation.   No wheezes, crackles, or rhonchi. No acute distress. Heart:  Regular rate and rhythm; no murmurs, clicks, rubs, or gallops. Abdomen:  Normal bowel sounds.  No bruits.  Soft, non-tender and non-distended without masses,  hepatosplenomegaly or hernias noted.  No guarding or rebound tenderness.    Neurologic:  Alert and oriented x3;  grossly normal neurologically. Skin:  Intact without significant lesions or rashes. No jaundice. Lymph Nodes:  No significant cervical adenopathy. Psych:  Alert and cooperative. Normal mood and affect.  Imaging Studies: No results found.  Assessment and Plan:   Christopher Murphy is a 74 y.o. y/o male has been referred for possible rectal mass.  History of possible aspiration with swallowing since his stroke.  This is suggestive of a transfer dysphagia.  Plan 1.  Colonoscopy and modified barium swallow.  I have discussed alternative options, risks & benefits,  which include, but are not limited to, bleeding, infection, perforation,respiratory complication & drug reaction.  The patient agrees with this plan & written consent will be obtained.     Follow up in 4 weeks telephone visit  Dr Christopher Bellows MD,MRCP(U.K)

## 2019-04-27 ENCOUNTER — Other Ambulatory Visit
Admission: RE | Admit: 2019-04-27 | Discharge: 2019-04-27 | Disposition: A | Discharge status: Home / Self Care | Payer: 59 | Source: Ambulatory Visit | Attending: Gastroenterology | Admitting: Gastroenterology

## 2019-04-27 ENCOUNTER — Telehealth: Payer: Self-pay | Admitting: Gastroenterology

## 2019-04-27 DIAGNOSIS — Z20822 Contact with and (suspected) exposure to covid-19: Secondary | ICD-10-CM | POA: Diagnosis not present

## 2019-04-27 DIAGNOSIS — Z01812 Encounter for preprocedural laboratory examination: Secondary | ICD-10-CM | POA: Diagnosis present

## 2019-04-27 LAB — SARS CORONAVIRUS 2 (TAT 6-24 HRS): SARS Coronavirus 2: NEGATIVE

## 2019-04-27 NOTE — Telephone Encounter (Signed)
Pt daughter eft vm to receive a call from Dr. Georgeann Oppenheim nurse

## 2019-04-27 NOTE — Telephone Encounter (Signed)
Spoke with pt's daughter in-law, [employer], regarding pt's COVID test I explained that I spoke with [facility] and was able to confirm that pt is okay to go for COVID test today instead of tomorrow.

## 2019-04-29 ENCOUNTER — Encounter: Payer: BLUE CROSS/BLUE SHIELD | Admitting: Internal Medicine

## 2019-04-30 ENCOUNTER — Encounter
Admission: RE | Disposition: A | Discharge status: Home / Self Care | Payer: Self-pay | Source: Home / Self Care | Attending: Gastroenterology

## 2019-04-30 ENCOUNTER — Ambulatory Visit: Payer: 59 | Admitting: Anesthesiology

## 2019-04-30 ENCOUNTER — Encounter: Payer: Self-pay | Admitting: Gastroenterology

## 2019-04-30 ENCOUNTER — Ambulatory Visit
Admission: RE | Admit: 2019-04-30 | Discharge: 2019-04-30 | Disposition: A | Discharge status: Home / Self Care | Payer: 59 | Attending: Gastroenterology | Admitting: Gastroenterology

## 2019-04-30 ENCOUNTER — Other Ambulatory Visit: Payer: Self-pay

## 2019-04-30 DIAGNOSIS — Z7982 Long term (current) use of aspirin: Secondary | ICD-10-CM | POA: Diagnosis not present

## 2019-04-30 DIAGNOSIS — K635 Polyp of colon: Secondary | ICD-10-CM | POA: Diagnosis not present

## 2019-04-30 DIAGNOSIS — Z8673 Personal history of transient ischemic attack (TIA), and cerebral infarction without residual deficits: Secondary | ICD-10-CM | POA: Insufficient documentation

## 2019-04-30 DIAGNOSIS — R6889 Other general symptoms and signs: Secondary | ICD-10-CM

## 2019-04-30 DIAGNOSIS — K640 First degree hemorrhoids: Secondary | ICD-10-CM | POA: Insufficient documentation

## 2019-04-30 DIAGNOSIS — Z79899 Other long term (current) drug therapy: Secondary | ICD-10-CM | POA: Insufficient documentation

## 2019-04-30 DIAGNOSIS — K6289 Other specified diseases of anus and rectum: Secondary | ICD-10-CM

## 2019-04-30 DIAGNOSIS — D122 Benign neoplasm of ascending colon: Secondary | ICD-10-CM | POA: Insufficient documentation

## 2019-04-30 DIAGNOSIS — K629 Disease of anus and rectum, unspecified: Secondary | ICD-10-CM | POA: Diagnosis present

## 2019-04-30 HISTORY — PX: COLONOSCOPY WITH PROPOFOL: SHX5780

## 2019-04-30 SURGERY — COLONOSCOPY WITH PROPOFOL
Anesthesia: General

## 2019-04-30 MED ORDER — PROPOFOL 10 MG/ML IV BOLUS
INTRAVENOUS | Status: DC | PRN
  Administered 2019-04-30: 60 ug via INTRAVENOUS

## 2019-04-30 MED ORDER — LACTATED RINGERS IV SOLN: INTRAVENOUS | Status: DC | PRN

## 2019-04-30 MED ORDER — SODIUM CHLORIDE 0.9 % IV SOLN: INTRAVENOUS | Status: DC

## 2019-04-30 MED ORDER — PROPOFOL 500 MG/50ML IV EMUL
INTRAVENOUS | Status: DC | PRN
  Administered 2019-04-30: 150 ug/kg/min via INTRAVENOUS

## 2019-04-30 NOTE — Anesthesia Procedure Notes (Signed)
Date/Time: 04/30/2019 10:40 AM Performed by: Nelda Marseille, CRNA Pre-anesthesia Checklist: Patient identified, Emergency Drugs available, Suction available, Patient being monitored and Timeout performed Oxygen Delivery Method: Nasal cannula

## 2019-04-30 NOTE — Anesthesia Postprocedure Evaluation (Signed)
Anesthesia Post Note  Patient: Shota Durel  Procedure(s) Performed: COLONOSCOPY WITH PROPOFOL (N/A )  Patient location during evaluation: PACU Anesthesia Type: General Level of consciousness: awake and alert Pain management: pain level controlled Vital Signs Assessment: post-procedure vital signs reviewed and stable Respiratory status: spontaneous breathing, nonlabored ventilation, respiratory function stable and patient connected to nasal cannula oxygen Cardiovascular status: blood pressure returned to baseline and stable Postop Assessment: no apparent nausea or vomiting Anesthetic complications: no     Last Vitals:  Vitals:   04/30/19 1150 04/30/19 1200  BP: 107/61 115/75  Pulse:    Resp:    Temp:    SpO2:      Last Pain:  Vitals:   04/30/19 1200  TempSrc:   PainSc: 0-No pain                 Molli Barrows

## 2019-04-30 NOTE — Transfer of Care (Signed)
Immediate Anesthesia Transfer of Care Note  Patient: Christopher Murphy  Procedure(s) Performed: COLONOSCOPY WITH PROPOFOL (N/A )  Patient Location: PACU  Anesthesia Type:MAC  Level of Consciousness: drowsy  Airway & Oxygen Therapy: Patient Spontanous Breathing and Patient connected to nasal cannula oxygen  Post-op Assessment: Report given to RN and Post -op Vital signs reviewed and stable  Post vital signs: Reviewed and stable  Last Vitals:  Vitals Value Taken Time  BP 89/54 04/30/19 1134  Temp 36.4 C 04/30/19 1130  Pulse 72 04/30/19 1135  Resp 14 04/30/19 1135  SpO2 100 % 04/30/19 1135  Vitals shown include unvalidated device data.  Last Pain:  Vitals:   04/30/19 1130  TempSrc: Oral  PainSc:          Complications: No apparent anesthesia complications

## 2019-04-30 NOTE — Anesthesia Preprocedure Evaluation (Signed)
Anesthesia Evaluation  Patient identified by MRN, date of birth, ID band Patient awake    Reviewed: Allergy & Precautions, H&P , NPO status , Patient's Chart, lab work & pertinent test results, reviewed documented beta blocker date and time   History of Anesthesia Complications Negative for: history of anesthetic complications  Airway Mallampati: II  TM Distance: >3 FB Neck ROM: full    Dental no notable dental hx. (+) Dental Advidsory Given, Poor Dentition   Pulmonary neg pulmonary ROS,    Pulmonary exam normal        Cardiovascular Exercise Tolerance: Good negative cardio ROS Normal cardiovascular exam     Neuro/Psych neg Seizures CVA, Residual Symptoms negative psych ROS   GI/Hepatic negative GI ROS, Neg liver ROS,   Endo/Other  negative endocrine ROS  Renal/GU negative Renal ROS  negative genitourinary   Musculoskeletal   Abdominal   Peds  Hematology negative hematology ROS (+)   Anesthesia Other Findings Past Medical History: 2009: Blood clots in brain No date: Stroke ([facility]   Reproductive/Obstetrics negative OB ROS                             Anesthesia Physical Anesthesia Plan  ASA: II  Anesthesia Plan: General   Post-op Pain Management:    Induction: Intravenous  PONV Risk Score and Plan: 2 and Propofol infusion and TIVA  Airway Management Planned: Natural Airway and Nasal Cannula  Additional Equipment:   Intra-op Plan:   Post-operative Plan:   Informed Consent: I have reviewed the patients History and Physical, chart, labs and discussed the procedure including the risks, benefits and alternatives for the proposed anesthesia with the patient or authorized representative who has indicated his/her understanding and acceptance.     Dental Advisory Given  Plan Discussed with: Anesthesiologist, CRNA and Surgeon  Anesthesia Plan Comments:         Anesthesia  Quick Evaluation

## 2019-04-30 NOTE — Op Note (Signed)
[facility] Gastroenterology Patient Name: Christopher Murphy Procedure Date: 04/30/2019 11:12 AM MRN: [id number] Account #: [id number] Date of Birth: 07/26/1945 Admit Type: Outpatient Age: 74 Room: [facility] ENDO ROOM 1 Gender: Male Note Status: Finalized Procedure:             Colonoscopy Indications:           Rectal mass Providers:             Jonathon Bellows MD, MD Referring MD:          [name] (Referring MD) Medicines:             Monitored Anesthesia Care Complications:         No immediate complications. Procedure:             Pre-Anesthesia Assessment:                        - Prior to the procedure, a History and Physical was                         performed, and patient medications, allergies and                         sensitivities were reviewed. The patient's tolerance                         of previous anesthesia was reviewed.                        - The risks and benefits of the procedure and the                         sedation options and risks were discussed with the                         patient. All questions were answered and informed                         consent was obtained.                        - ASA Grade Assessment: III - A patient with severe                         systemic disease.                        After obtaining informed consent, the colonoscope was                         passed under direct vision. Throughout the procedure,                         the patient's blood pressure, pulse, and oxygen                         saturations were monitored continuously. The                         Colonoscope was introduced through the anus and  advanced to the the cecum, identified by the                         appendiceal orifice. The colonoscopy was performed                         with ease. The patient tolerated the procedure well.                         The quality of the bowel preparation was  good. Findings:      The perianal and digital rectal examinations were normal.      A 3 mm polyp was found in the ascending colon. The polyp was sessile.       The polyp was removed with a cold biopsy forceps. Resection and       retrieval were complete.      Non-bleeding internal hemorrhoids were found during retroflexion. The       hemorrhoids were medium-sized and Grade I (internal hemorrhoids that do       not prolapse).      The exam was otherwise without abnormality on direct and retroflexion       views. Impression:            - One 3 mm polyp in the ascending colon, removed with                         a cold biopsy forceps. Resected and retrieved.                        - Non-bleeding internal hemorrhoids.                        - The examination was otherwise normal on direct and                         retroflexion views. Recommendation:        - Discharge patient to home (with escort).                        - Resume previous diet.                        - Continue present medications.                        - Await pathology results.                        - no further colonoscopy in veiw of age Procedure Code(s):     --- Professional ---                        [phone], Colonoscopy, flexible; with biopsy, single or                         multiple Diagnosis Code(s):     --- Professional ---                        K63.5, Polyp of colon  K64.0, First degree hemorrhoids                        K62.89, Other specified diseases of anus and rectum CPT copyright 2019 American Medical Association. All rights reserved. The codes documented in this report are preliminary and upon coder review may  be revised to meet current compliance requirements. Jonathon Bellows, MD Jonathon Bellows MD, MD 04/30/2019 11:31:59 AM This report has been signed electronically. Number of Addenda: 0 Note Initiated On: 04/30/2019 11:12 AM Scope Withdrawal Time: 0 hours 9 minutes 58 seconds   Total Procedure Duration: 0 hours 16 minutes 9 seconds  Estimated Blood Loss:  Estimated blood loss: none.      [facility]

## 2019-04-30 NOTE — H&P (Signed)
Jonathon Bellows, MD [address] [place], [place], [place], [address] [address], [place], [place], [place], [address] Phone: [phone]  Fax: [phone]  Primary Care Physician:  [name], NP   Pre-Procedure History & Physical: HPI:  Christopher Murphy is a 75 y.o. male is here for an colonoscopy.   Past Medical History:  Diagnosis Date  . Blood clots in brain 2009  . Stroke [facility])     History reviewed. No pertinent surgical history.  Prior to Admission medications   Medication Sig Start Date End Date Taking? Authorizing Provider  aspirin EC 81 MG EC tablet Take 1 tablet (81 mg total) by mouth daily. 04/07/16  Yes Hower, Aaron Mose, MD  clopidogrel (PLAVIX) 75 MG tablet Take 1 tablet (75 mg total) by mouth daily. 10/03/16  Yes [name], NP  clopidogrel (PLAVIX) 75 MG tablet Take by mouth.   Yes [provider]  memantine (NAMENDA) 5 MG tablet Take 1 tablet (5 mg total) by mouth 2 (two) times daily. 10/03/16  Yes [name], NP  memantine (NAMENDA) 5 MG tablet Take by mouth.   Yes [provider]  rosuvastatin (CRESTOR) 20 MG tablet Take 1 tablet (20 mg total) by mouth daily. 10/03/16  Yes [name], NP  cetirizine (ZYRTEC) 10 MG tablet Take 1 tablet (10 mg total) by mouth daily. 06/11/17   [name], NP  divalproex (DEPAKOTE ER) 500 MG 24 hr tablet Take 1 tablet (500 mg total) by mouth daily. Patient not taking: Reported on 04/21/2019 03/11/18   Garvin Fila, MD  FA-B6-B12-Omega 3-Phytosterols (TALIVA) 1 MG CAPS Take by mouth.    [provider]  fluticasone (FLONASE) 50 MCG/ACT nasal spray Place 2 sprays into both nostrils daily. 06/11/17   [name], NP  Folic Acid 5 MG CAPS Take 5 mg by mouth daily.     [provider]  Polyethyl Glycol-Propyl Glycol (SYSTANE) 0.4-0.3 % SOLN Apply 1 drop to eye 2 (two) times daily. 06/12/16   [name], NP    Allergies as of 04/21/2019  . (No Known Allergies)     Family History  Problem Relation Age of Onset  . AAA (abdominal aortic aneurysm) Neg Hx     Social History   Socioeconomic History  . Marital status: Married    Spouse name: Not on file  . Number of children: Not on file  . Years of education: Not on file  . Highest education level: Not on file  Occupational History  . Not on file  Tobacco Use  . Smoking status: Never Smoker  . Smokeless tobacco: Never Used  Substance and Sexual Activity  . Alcohol use: No  . Drug use: No  . Sexual activity: Not on file  Other Topics Concern  . Not on file  Social History Narrative  . Not on file   Social Determinants of Health   Financial Resource Strain:   . Difficulty of Paying Living Expenses: Not on file  Food Insecurity:   . Worried About Charity fundraiser in the Last Year: Not on file  . Ran Out of Food in the Last Year: Not on file  Transportation Needs:   . Lack of Transportation (Medical): Not on file  . Lack of Transportation (Non-Medical): Not on file  Physical Activity:   . Days of Exercise per Week: Not on file  . Minutes of Exercise per Session: Not on file  Stress:   . Feeling of Stress : Not on file  Social Connections:   . Frequency of Communication with Friends and Family: Not on file  . Frequency of Social Gatherings with Friends and Family: Not on file  . Attends Religious Services: Not on file  . Active Member of Clubs or Organizations: Not on file  . Attends Archivist Meetings: Not on file  . Marital Status: Not on file  Intimate Partner Violence:   . Fear of Current or Ex-Partner: Not on file  . Emotionally Abused: Not on file  . Physically Abused: Not on file  . Sexually Abused: Not on file    Review of Systems: See HPI, otherwise negative ROS  Physical Exam: BP 114/79   Pulse 80   Temp 97.6 F (36.4 C) (Oral)   Resp 18   Ht 5\' 9"  (1.753 m)   Wt 71.7 kg   SpO2 100%   BMI 23.33 kg/m  General:   Alert,  pleasant and  cooperative in NAD Head:  Normocephalic and atraumatic. Neck:  Supple; no masses or thyromegaly. Lungs:  Clear throughout to auscultation, normal respiratory effort.    Heart:  +S1, +S2, Regular rate and rhythm, No edema. Abdomen:  Soft, nontender and nondistended. Normal bowel sounds, without guarding, and without rebound.   Neurologic:  Alert and  oriented x4;  grossly normal neurologically.  Impression/Plan: Christopher Murphy is here for an colonoscopy to be performed for possible rectal mass. Risks, benefits, limitations, and alternatives regarding  colonoscopy have been reviewed with the patient.  Questions have been answered.  All parties agreeable.   Jonathon Bellows, MD  04/30/2019, 10:52 AM

## 2019-05-01 LAB — SURGICAL PATHOLOGY

## 2019-05-04 ENCOUNTER — Telehealth: Payer: Self-pay

## 2019-05-04 NOTE — Telephone Encounter (Signed)
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

## 2019-05-04 NOTE — Telephone Encounter (Signed)
Sent patient a Therapist, music. Patient verbalized understanding

## 2019-05-08 ENCOUNTER — Other Ambulatory Visit: Payer: Self-pay

## 2019-05-08 ENCOUNTER — Ambulatory Visit
Admission: RE | Admit: 2019-05-08 | Discharge: 2019-05-08 | Disposition: A | Discharge status: Home / Self Care | Payer: 59 | Source: Ambulatory Visit | Attending: Gastroenterology | Admitting: Gastroenterology

## 2019-05-08 DIAGNOSIS — R1312 Dysphagia, oropharyngeal phase: Secondary | ICD-10-CM | POA: Diagnosis present

## 2019-05-08 NOTE — Therapy (Signed)
[place], [place], [address] Phone: [phone]   Fax:     Modified Barium Swallow  Patient Details  Name: Christopher Murphy MRN: [id number] Date of Birth: October 08, 1945 No data recorded  Encounter Date: 05/08/2019  End of Session - 05/08/19 1426    Visit Number  1    Number of Visits  1    Date for SLP Re-Evaluation  05/08/19    SLP Start Time  1330    SLP Stop Time   1426    SLP Time Calculation (min)  56 min    Activity Tolerance  Patient tolerated treatment well       Past Medical History:  Diagnosis Date  . Blood clots in brain 2009  . Stroke [facility])     Past Surgical History:  Procedure Laterality Date  . COLONOSCOPY WITH PROPOFOL N/A 04/30/2019   Procedure: COLONOSCOPY WITH PROPOFOL;  Surgeon: Jonathon Bellows, MD;  Location: [facility] ENDOSCOPY;  Service: Gastroenterology;  Laterality: N/A;    There were no vitals filed for this visit.     Subjective: Patient behavior: (alertness, ability to follow instructions, etc.): Patient is accompanied by his daughter, who serves as his interpreter.  The patient has a Market researcher.  Chief complaint: Patient has remote history of stroke.  His daughter daughter reports most difficulty with foods, especially spicy foods, and minimal problems with liquid.  She denies any chronic pulmonary issues.   Objective:  Radiological Procedure: A videoflouroscopic evaluation of oral-preparatory, reflex initiation, and pharyngeal phases of the swallow was performed; as well as a screening of the upper esophageal phase.  I. POSTURE: Upright in MBS chair  II. VIEW: Lateral  III. COMPENSATORY STRATEGIES: Liquid wash aids pharyngeal clearance  IV. BOLUSES ADMINISTERED:   Thin Liquid: 5   Nectar-thick Liquid: 2   Honey-thick Liquid: DNT   Puree: 2 teaspoon presentations   Mechanical Soft: 1/4 graham cracker in applesauce  V. RESULTS OF  EVALUATION: A. ORAL PREPARATORY PHASE: (The lips, tongue, and velum are observed for strength and coordination)       **Overall Severity Rating: within functional limits   B. SWALLOW INITIATION/REFLEX: (The reflex is normal if "triggered" by the time the bolus reached the base of the tongue)  **Overall Severity Rating: Mild-moderate; triggers while falling from the valleculae to the pyriform sinuses  C. PHARYNGEAL PHASE: (Pharyngeal function is normal if the bolus shows rapid, smooth, and continuous transit through the pharynx and there is no pharyngeal residue after the swallow)  **Overall Severity Rating: Mild; reduced tongue base retraction and reduced hyolaryngeal excursion; mild pharyngeal residue  D. LARYNGEAL PENETRATION: (Material entering into the laryngeal inlet/vestibule but not aspirated) None  E. ASPIRATION: None  F. ESOPHAGEAL PHASE: (Screening of the upper esophagus) In the cervical esophagus there is a finger-like protrusion along the posterior wall during swallow (does not impede flow of boluses) consistent with prominent cricopharyngeus.    ASSESSMENT: This 74 year old man; with history of remote stroke and increased coughing at meals; is presenting with mild oropharyngeal dysphagia characterized by delayed pharyngeal swallow initiation, reduced pharyngeal pressure generation, and mild pharyngeal residue (solids>liquids).  Oral control of the bolus including oral hold, rotary mastication, and anterior to posterior transfer is within functional limits.   There is no observed laryngeal penetration or tracheal aspiration.  The patient appears to be at reduced risk for prandial aspiration.  In the cervical esophagus there is a finger-like protrusion along the posterior wall  during swallow (does not impede flow of boluses) consistent with prominent cricopharyngeus.  The patient's daughter reports most difficulty with foods, especially spicy foods, and minimal problems with liquid. It is  possible that some particles or ingredients are stimulating cough centers.  The patient and his daughter were advised to monitor vocal quality and pulmonary health as the best indicators of worsening oropharyngeal dysphagia.  PLAN/RECOMMENDATIONS:   A. Diet: Regular   B. Swallowing Precautions: Alternate liquids and solids, avoid foods that elicit coughing   C. Recommended consultation to: follow up with MDs as recommended   D. Therapy recommendations: speech therapy is not indicated   E. Results and recommendations were discussed with the patient and his daughter (his interpreter) immediatly following the study and the final report routed to the referring MD.   Oropharyngeal dysphagia - Plan: DG Swallowing Func-Speech Pathology, DG Swallowing Func-Speech Pathology        Problem List Patient Active Problem List   Diagnosis Date Noted  . Chronic dryness of both eyes 06/12/2016  . TIA (transient ischemic attack) 04/05/2016   Leroy Sea, MS/CCC- SLP  Lou Miner 05/08/2019, 2:28 PM  [place], [place], [address] Phone: [phone]   Fax:     Name: Christopher Murphy MRN: [id number] Date of Birth: 01-Jul-1945

## 2019-05-20 ENCOUNTER — Ambulatory Visit (INDEPENDENT_AMBULATORY_CARE_PROVIDER_SITE_OTHER): Payer: 59 | Admitting: Gastroenterology

## 2019-05-20 DIAGNOSIS — D126 Benign neoplasm of colon, unspecified: Secondary | ICD-10-CM | POA: Diagnosis not present

## 2019-05-20 DIAGNOSIS — R1312 Dysphagia, oropharyngeal phase: Secondary | ICD-10-CM

## 2019-05-20 DIAGNOSIS — K5900 Constipation, unspecified: Secondary | ICD-10-CM | POA: Diagnosis not present

## 2019-05-20 NOTE — Progress Notes (Signed)
Christopher Murphy , MD [address]  [place]  [place], [place] [address]  Main: [phone]  Fax: [phone]   Primary Care Physician: [name], NP  Virtual Visit via Telephone Note  I connected with patient on 05/20/19 at  8:45 AM EST by telephone and verified that I am speaking with the correct person using two identifiers.   I discussed the limitations, risks, security and privacy concerns of performing an evaluation and management service by telephone and the availability of in person appointments. I also discussed with the patient that there may be a patient responsible charge related to this service. The patient expressed understanding and agreed to proceed.  Location of Patient: Home Location of Provider: Home Persons involved: Patient and provider only   History of Present Illness:   Follow-up for constipation.  HPI: Christopher Murphy is a 74 y.o. male   Summary of history :  He was initially referred and seen on 04/21/2019 for a possible rectal mass and chronic constipation.  History of hemorrhoidectomy 25 years back in [place].  Interval history   04/21/2019-through 01/27/2020  04/30/2019 colonoscopy: 3 mm polyp was excised and internal hemorrhoids were noted.  Polyp was a tubular adenoma.  He is doing well still feels a masslike lesion protruding from his anus.  He states that it is uncomfortable.  Still has constipation.  Not tried any MiraLAX.  He had a modified barium swallow but unfortunately the result has not yet been reported.  Current Outpatient Medications  Medication Sig Dispense Refill  . aspirin EC 81 MG EC tablet Take 1 tablet (81 mg total) by mouth daily. 30 tablet 0  . cetirizine (ZYRTEC) 10 MG tablet Take 1 tablet (10 mg total) by mouth daily. 30 tablet 11  . clopidogrel (PLAVIX) 75 MG tablet Take 1 tablet (75 mg total) by mouth daily. 180 tablet 1  . clopidogrel (PLAVIX) 75 MG tablet Take by mouth.    . divalproex (DEPAKOTE ER) 500  MG 24 hr tablet Take 1 tablet (500 mg total) by mouth daily. (Patient not taking: Reported on 04/21/2019) 30 tablet 3  . FA-B6-B12-Omega 3-Phytosterols (TALIVA) 1 MG CAPS Take by mouth.    . fluticasone (FLONASE) 50 MCG/ACT nasal spray Place 2 sprays into both nostrils daily. 16 g 6  . Folic Acid 5 MG CAPS Take 5 mg by mouth daily.     . memantine (NAMENDA) 5 MG tablet Take 1 tablet (5 mg total) by mouth 2 (two) times daily. 360 tablet 1  . memantine (NAMENDA) 5 MG tablet Take by mouth.    Vladimir Faster Glycol-Propyl Glycol (SYSTANE) 0.4-0.3 % SOLN Apply 1 drop to eye 2 (two) times daily. 30 mL 5  . rosuvastatin (CRESTOR) 20 MG tablet Take 1 tablet (20 mg total) by mouth daily. 180 tablet 1   No current facility-administered medications for this visit.    Allergies as of 05/20/2019  . (No Known Allergies)    Review of Systems:    All systems reviewed and negative except where noted in HPI.   Observations/Objective:  Labs: CMP     Component Value Date/Time   NA 135 06/17/2017 1452   K 4.5 06/17/2017 1452   CL 100 06/17/2017 1452   CO2 31 06/17/2017 1452   GLUCOSE 97 06/17/2017 1452   BUN 13 06/17/2017 1452   CREATININE 0.80 03/31/2018 1524   CREATININE 0.93 02/21/2015 1008   CALCIUM 9.4 06/17/2017 1452   PROT 7.2 06/17/2017 1452  ALBUMIN 3.9 06/17/2017 1452   AST 12 06/17/2017 1452   ALT 11 06/17/2017 1452   ALKPHOS 56 06/17/2017 1452   BILITOT 0.3 06/17/2017 1452   GFRNONAA >60 04/05/2016 1501   GFRNONAA 83 02/21/2015 1008   GFRAA >60 04/05/2016 1501   GFRAA >89 02/21/2015 1008   Lab Results  Component Value Date   WBC 7.0 06/17/2017   HGB 12.7 (L) 06/17/2017   HCT 38.2 (L) 06/17/2017   MCV 81.8 06/17/2017   PLT 311.0 06/17/2017    Imaging Studies: No results found.  Assessment and Plan:   Christopher Murphy is a 75 y.o. y/o male here today to follow-up for constipation.  He still feels a sensation of a mass propping out of his anus.  Constipation not  better and has not tried MiraLAX.  Modified barium swallow for transfer dysphagia has been performed but the result has not been read yet.  Plan :   1.  Follow-up in 6 to 8 weeks for rectal exam to determine if it is hemorrhoids or prolapsing or a skin tag that he feels is abnormal. 2.  Add MiraLAX 1 capful a day or 2 capfuls a day for constipation 3.  We will call the radiologist to find out what the result of the verified barium swallow. 4.  Tubular adenoma of the colon will not require a repeat colonoscopy in 7 years time due to his age.   Follow-up in the office in 6 to 8 weeks I discussed the assessment and treatment plan with the patient. The patient was provided an opportunity to ask questions and all were answered. The patient agreed with the plan and demonstrated an understanding of the instructions.   The patient was advised to call back or seek an in-person evaluation if the symptoms worsen or if the condition fails to improve as anticipated.  I provided 15 minutes of non-face-to-face time during this encounter.  Dr Christopher Bellows MD,MRCP [facility]) Gastroenterology/Hepatology Pager: [phone]   Speech recognition software was used to dictate this note.

## 2019-05-21 ENCOUNTER — Other Ambulatory Visit: Payer: Self-pay | Admitting: Internal Medicine

## 2019-05-21 ENCOUNTER — Encounter: Payer: Self-pay | Admitting: Speech Pathology

## 2019-06-03 ENCOUNTER — Encounter: Payer: 59 | Admitting: Internal Medicine

## 2019-06-12 ENCOUNTER — Ambulatory Visit (INDEPENDENT_AMBULATORY_CARE_PROVIDER_SITE_OTHER): Payer: 59 | Admitting: Internal Medicine

## 2019-06-12 ENCOUNTER — Encounter: Payer: Self-pay | Admitting: Internal Medicine

## 2019-06-12 ENCOUNTER — Ambulatory Visit (INDEPENDENT_AMBULATORY_CARE_PROVIDER_SITE_OTHER)
Admission: RE | Admit: 2019-06-12 | Discharge: 2019-06-12 | Disposition: A | Discharge status: Home / Self Care | Payer: 59 | Source: Ambulatory Visit | Attending: Internal Medicine | Admitting: Internal Medicine

## 2019-06-12 ENCOUNTER — Other Ambulatory Visit: Payer: Self-pay

## 2019-06-12 VITALS — BP 116/74 | HR 72 | Temp 97.2°F | Ht 69.0 in | Wt 152.0 lb

## 2019-06-12 DIAGNOSIS — H6123 Impacted cerumen, bilateral: Secondary | ICD-10-CM | POA: Diagnosis not present

## 2019-06-12 DIAGNOSIS — Z0000 Encounter for general adult medical examination without abnormal findings: Secondary | ICD-10-CM | POA: Diagnosis not present

## 2019-06-12 DIAGNOSIS — G459 Transient cerebral ischemic attack, unspecified: Secondary | ICD-10-CM | POA: Diagnosis not present

## 2019-06-12 DIAGNOSIS — R05 Cough: Secondary | ICD-10-CM

## 2019-06-12 DIAGNOSIS — Z125 Encounter for screening for malignant neoplasm of prostate: Secondary | ICD-10-CM | POA: Diagnosis not present

## 2019-06-12 DIAGNOSIS — R053 Chronic cough: Secondary | ICD-10-CM

## 2019-06-12 MED ORDER — MEMANTINE HCL 5 MG PO TABS: 5.0000 mg | ORAL_TABLET | Freq: Two times a day (BID) | ORAL | 3 refills | Status: DC

## 2019-06-12 MED ORDER — ROSUVASTATIN CALCIUM 20 MG PO TABS: 20.0000 mg | ORAL_TABLET | Freq: Every day | ORAL | 3 refills | Status: DC

## 2019-06-12 MED ORDER — ASPIRIN 81 MG PO TBEC: 81.0000 mg | DELAYED_RELEASE_TABLET | Freq: Every day | ORAL | 3 refills | Status: DC

## 2019-06-12 MED ORDER — CLOPIDOGREL BISULFATE 75 MG PO TABS: 75.0000 mg | ORAL_TABLET | Freq: Every day | ORAL | 3 refills | Status: DC

## 2019-06-12 MED ORDER — TAMSULOSIN HCL 0.4 MG PO CAPS: 0.4000 mg | ORAL_CAPSULE | Freq: Every day | ORAL | 3 refills | Status: DC

## 2019-06-12 NOTE — Assessment & Plan Note (Signed)
ASA, Plavix and Rosuvastatin refilled CBC, CMET and Lipid profile today Encouraged him to consume a low fat diet He will continue to follow with neurology

## 2019-06-12 NOTE — Progress Notes (Addendum)
Subjective:    Patient ID: Christopher Murphy, male    DOB: 10/02/1945, 74 y.o.   MRN: [id number]  HPI  Pt presents to the clinic today for his annual exam. He is also due to follow up chronic conditions.  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 He follows with neurology.  He also reports chronic cough. This has been going on for a year. The cough is dry. It is not worse at any particular time during the day. He denies runny nose, nasal congestion or SOB. He denies reflux. He has had a normal barium swallow recently. He has tried cough suppressants OTC with minimal relief.   Flu: 02/2019 Tetanus: 02/2015 Pneumovax: 02/2015 Prevnar: 06/2017 Zostovax: never Shingrix: never Covid: 05/15/19, 06/05/19 Colon Screening: 04/2019 Vision Screening: as needed Dentist: a sneeded  Diet: He does not eat meat. He consumes fruits and veggies daily. He rarely eats fried foods. He drinks mainly water, milk and juice. Exercise: Walking  Review of Systems  Past Medical History:  Diagnosis Date  . Blood clots in brain 2009  . Stroke [facility])     Current Outpatient Medications  Medication Sig Dispense Refill  . aspirin EC 81 MG EC tablet Take 1 tablet (81 mg total) by mouth daily. 30 tablet 0  . cetirizine (ZYRTEC) 10 MG tablet Take 1 tablet (10 mg total) by mouth daily. 30 tablet 11  . clopidogrel (PLAVIX) 75 MG tablet Take 1 tablet (75 mg total) by mouth daily. 180 tablet 1  . clopidogrel (PLAVIX) 75 MG tablet Take by mouth.    . divalproex (DEPAKOTE ER) 500 MG 24 hr tablet Take 1 tablet (500 mg total) by mouth daily. (Patient not taking: Reported on 04/21/2019) 30 tablet 3  . FA-B6-B12-Omega 3-Phytosterols (TALIVA) 1 MG CAPS Take by mouth.    . fluticasone (FLONASE) 50 MCG/ACT nasal spray Place 2 sprays into both nostrils daily. 16 g 6  . Folic Acid 5 MG CAPS Take 5 mg by mouth daily.     .  memantine (NAMENDA) 5 MG tablet Take 1 tablet (5 mg total) by mouth 2 (two) times daily. 360 tablet 1  . memantine (NAMENDA) 5 MG tablet Take by mouth.    Vladimir Faster Glycol-Propyl Glycol (SYSTANE) 0.4-0.3 % SOLN Apply 1 drop to eye 2 (two) times daily. 30 mL 5  . rosuvastatin (CRESTOR) 20 MG tablet Take 1 tablet (20 mg total) by mouth daily. 180 tablet 1   No current facility-administered medications for this visit.    No Known Allergies  Family History  Problem Relation Age of Onset  . AAA (abdominal aortic aneurysm) Neg Hx     Social History   Socioeconomic History  . Marital status: Married    Spouse name: Not on file  . Number of children: Not on file  . Years of education: Not on file  . Highest education level: Not on file  Occupational History  . Not on file  Tobacco Use  . Smoking status: Never Smoker  . Smokeless tobacco: Never Used  Substance and Sexual Activity  . Alcohol use: No  . Drug use: No  . Sexual activity: Not on file  Other Topics Concern  . Not on file  Social History Narrative  . Not on file   Social Determinants of Health   Financial Resource Strain:   . Difficulty of Paying Living Expenses: Not  on file  Food Insecurity:   . Worried About Charity fundraiser in the Last Year: Not on file  . Ran Out of Food in the Last Year: Not on file  Transportation Needs:   . Lack of Transportation (Medical): Not on file  . Lack of Transportation (Non-Medical): Not on file  Physical Activity:   . Days of Exercise per Week: Not on file  . Minutes of Exercise per Session: Not on file  Stress:   . Feeling of Stress : Not on file  Social Connections:   . Frequency of Communication with Friends and Family: Not on file  . Frequency of Social Gatherings with Friends and Family: Not on file  . Attends Religious Services: Not on file  . Active Member of Clubs or Organizations: Not on file  . Attends Archivist Meetings: Not on file  . Marital  Status: Not on file  Intimate Partner Violence:   . Fear of Current or Ex-Partner: Not on file  . Emotionally Abused: Not on file  . Physically Abused: Not on file  . Sexually Abused: Not on file     Constitutional: Denies fever, malaise, fatigue, headache or abrupt weight changes.  HEENT: Pt reports wax buildup. Denies eye pain, eye redness, ear pain, ringing in the ears, wax buildup, runny nose, nasal congestion, bloody nose, or sore throat. Respiratory: Pt reports chronic cough. Denies difficulty breathing, shortness of breath,  or sputum production.   Cardiovascular: Denies chest pain, chest tightness, palpitations or swelling in the hands or feet.  Gastrointestinal: Pt reports chronic constipation. Denies abdominal pain, bloating, diarrhea or blood in the stool.  GU: Pt reports nocturia. Denies urgency, frequency, pain with urination, burning sensation, blood in urine, odor or discharge. Musculoskeletal: Denies decrease in range of motion, difficulty with gait, muscle pain or joint pain and swelling.  Skin: Denies redness, rashes, lesions or ulcercations.  Neurological: Pt reports difficulty with memory. Denies dizziness, difficulty with speech or problems with balance and coordination.  Psych: Denies anxiety, depression, SI/HI.  No other specific complaints in a complete review of systems (except as listed in HPI above).     Objective:   Physical Exam BP 116/74   Pulse 72   Temp (!) 97.2 F (36.2 C) (Temporal)   Ht 5\' 9"  (1.753 m)   Wt 152 lb (68.9 kg)   SpO2 100%   BMI 22.45 kg/m   Wt Readings from Last 3 Encounters:  04/30/19 158 lb (71.7 kg)  04/21/19 154 lb 12.8 oz (70.2 kg)  03/11/18 161 lb 6.4 oz (73.2 kg)    General: Appears his stated age, well developed, well nourished in NAD. Skin: Warm, dry and intact. No rashes noted. HEENT: Head: normal shape and size; Eyes: sclera white, no icterus, conjunctiva pink, PERRLA and EOMs intact; Ears: bilateral cerumen  impaction;  Neck:  Neck supple, trachea midline. No masses, lumps or thyromegaly present.  Cardiovascular: Normal rate and rhythm. S1,S2 noted.  No murmur, rubs or gallops noted. No JVD or BLE edema. No carotid bruits noted. Pulmonary/Chest: Normal effort and positive vesicular breath sounds. No respiratory distress. No wheezes, rales or ronchi noted.  Abdomen: Soft and nontender. Normal bowel sounds. No distention or masses noted. Liver, spleen and kidneys non palpable. Musculoskeletal: Strength 5/5 BUE/BLE. No difficulty with gait.  Neurological: Alert and oriented. Cranial nerves II-XII grossly intact. Coordination normal.  Psychiatric: Mood and affect normal. Behavior is normal. Judgment and thought content normal.  BMET    Component Value Date/Time   NA 135 06/17/2017 1452   K 4.5 06/17/2017 1452   CL 100 06/17/2017 1452   CO2 31 06/17/2017 1452   GLUCOSE 97 06/17/2017 1452   BUN 13 06/17/2017 1452   CREATININE 0.80 03/31/2018 1524   CREATININE 0.93 02/21/2015 1008   CALCIUM 9.4 06/17/2017 1452   GFRNONAA >60 04/05/2016 1501   GFRNONAA 83 02/21/2015 1008   GFRAA >60 04/05/2016 1501   GFRAA >89 02/21/2015 1008    Lipid Panel     Component Value Date/Time   CHOL 114 03/11/2018 1703   TRIG 217 (H) 03/11/2018 1703   HDL 34 (L) 03/11/2018 1703   CHOLHDL 3.4 03/11/2018 1703   CHOLHDL 3 06/17/2017 1452   VLDL 46.8 (H) 06/17/2017 1452   LDLCALC 37 03/11/2018 1703    CBC    Component Value Date/Time   WBC 7.0 06/17/2017 1452   RBC 4.68 06/17/2017 1452   HGB 12.7 (L) 06/17/2017 1452   HCT 38.2 (L) 06/17/2017 1452   PLT 311.0 06/17/2017 1452   MCV 81.8 06/17/2017 1452   MCH 27.2 04/05/2016 1501   MCHC 33.2 06/17/2017 1452   RDW 13.5 06/17/2017 1452   LYMPHSABS 0.8 (L) 04/05/2016 1501   MONOABS 0.8 04/05/2016 1501   EOSABS 0.2 04/05/2016 1501   BASOSABS 0.3 (H) 04/05/2016 1501    Hgb A1C Lab Results  Component Value Date   HGBA1C 5.4 03/11/2018              Assessment & Plan:   Preventative Health Maintenance:  Flu UTD Tetanus UTD Pneumovax UTD Prevnar UTD He declines Shingrix or Zostovax Covid vaccines UTD Colon screening UTD Encouraged him to consume a balanced diet and exercise regimen Advised him to see an eye doctor and dentist annually Will check CBC, CMET, Lipid, and PSA today  Bilateral Cerumen Impaction:  Manual lavage by CMA Can try Debrox OTC 2 x week to prevent wax buildup It would be helpful if he trimmed the hair in his ears  Chronic Cough:  Chest xray for further evaluation Start Claritin 10 mg OTC daily  Nocturia:  Likely BPH RX for Flomax 0.4 mg daily  RTC in 1 year, sooner if needed Webb Silversmith, NP This visit occurred during the SARS-CoV-2 public health emergency.  Safety protocols were in place, including screening questions prior to the visit, additional usage of staff PPE, and extensive cleaning of exam room while observing appropriate contact time as indicated for disinfecting solutions.

## 2019-06-12 NOTE — Patient Instructions (Signed)
Health Maintenance After Age 74 After age 74, you are at a higher risk for certain long-term diseases and infections as well as injuries from falls. Falls are a major cause of broken bones and head injuries in people who are older than age 74. Getting regular preventive care can help to keep you healthy and well. Preventive care includes getting regular testing and making lifestyle changes as recommended by your health care provider. Talk with your health care provider about:  Which screenings and tests you should have. A screening is a test that checks for a disease when you have no symptoms.  A diet and exercise plan that is right for you. What should I know about screenings and tests to prevent falls? Screening and testing are the best ways to find a health problem early. Early diagnosis and treatment give you the best chance of managing medical conditions that are common after age 74. Certain conditions and lifestyle choices may make you more likely to have a fall. Your health care provider may recommend:  Regular vision checks. Poor vision and conditions such as cataracts can make you more likely to have a fall. If you wear glasses, make sure to get your prescription updated if your vision changes.  Medicine review. Work with your health care provider to regularly review all of the medicines you are taking, including over-the-counter medicines. Ask your health care provider about any side effects that may make you more likely to have a fall. Tell your health care provider if any medicines that you take make you feel dizzy or sleepy.  Osteoporosis screening. Osteoporosis is a condition that causes the bones to get weaker. This can make the bones weak and cause them to break more easily.  Blood pressure screening. Blood pressure changes and medicines to control blood pressure can make you feel dizzy.  Strength and balance checks. Your health care provider may recommend certain tests to check your  strength and balance while standing, walking, or changing positions.  Foot health exam. Foot pain and numbness, as well as not wearing proper footwear, can make you more likely to have a fall.  Depression screening. You may be more likely to have a fall if you have a fear of falling, feel emotionally low, or feel unable to do activities that you used to do.  Alcohol use screening. Using too much alcohol can affect your balance and may make you more likely to have a fall. What actions can I take to lower my risk of falls? General instructions  Talk with your health care provider about your risks for falling. Tell your health care provider if: ? You fall. Be sure to tell your health care provider about all falls, even ones that seem minor. ? You feel dizzy, sleepy, or off-balance.  Take over-the-counter and prescription medicines only as told by your health care provider. These include any supplements.  Eat a healthy diet and maintain a healthy weight. A healthy diet includes low-fat dairy products, low-fat (lean) meats, and fiber from whole grains, beans, and lots of fruits and vegetables. Home safety  Remove any tripping hazards, such as rugs, cords, and clutter.  Install safety equipment such as grab bars in bathrooms and safety rails on stairs.  Keep rooms and walkways well-lit. Activity   Follow a regular exercise program to stay fit. This will help you maintain your balance. Ask your health care provider what types of exercise are appropriate for you.  If you need a cane or   walker, use it as recommended by your health care provider.  Wear supportive shoes that have nonskid soles. Lifestyle  Do not drink alcohol if your health care provider tells you not to drink.  If you drink alcohol, limit how much you have: ? 0-1 drink a day for women. ? 0-2 drinks a day for men.  Be aware of how much alcohol is in your drink. In the [place] one drink equals one typical bottle of beer (12  oz), one-half glass of wine (5 oz), or one shot of hard liquor (1 oz).  Do not use any products that contain nicotine or tobacco, such as cigarettes and e-cigarettes. If you need help quitting, ask your health care provider. Summary  Having a healthy lifestyle and getting preventive care can help to protect your health and wellness after age 74.  Screening and testing are the best way to find a health problem early and help you avoid having a fall. Early diagnosis and treatment give you the best chance for managing medical conditions that are more common for people who are older than age 74.  Falls are a major cause of broken bones and head injuries in people who are older than age 74. Take precautions to prevent a fall at home.  Work with your health care provider to learn what changes you can make to improve your health and wellness and to prevent falls. This information is not intended to replace advice given to you by your health care provider. Make sure you discuss any questions you have with your health care provider. Document Revised: 07/17/2018 Document Reviewed: 02/06/2017 Elsevier Patient Education  2020 [employer] Inc[employer]  

## 2019-06-13 LAB — CBC
HCT: 39.7 % (ref 38.5–50.0)
Hemoglobin: 13.2 g/dL (ref 13.2–17.1)
MCH: 27.4 pg (ref 27.0–33.0)
MCHC: 33.2 g/dL (ref 32.0–36.0)
MCV: 82.5 fL (ref 80.0–100.0)
MPV: 10.3 fL (ref 7.5–12.5)
Platelets: 231 10*3/uL (ref 140–400)
RBC: 4.81 10*6/uL (ref 4.20–5.80)
RDW: 12.9 % (ref 11.0–15.0)
WBC: 7.3 10*3/uL (ref 3.8–10.8)

## 2019-06-13 LAB — LIPID PANEL
Cholesterol: 103 mg/dL (ref <200)
HDL: 36 mg/dL — ABNORMAL LOW (ref >=40)
LDL Cholesterol (Calc): 45 mg/dL (calc)
Non-HDL Cholesterol (Calc): 67 mg/dL (calc) (ref <130)
Total CHOL/HDL Ratio: 2.9 (calc) (ref <5.0)
Triglycerides: 134 mg/dL (ref <150)

## 2019-06-13 LAB — COMPREHENSIVE METABOLIC PANEL
AG Ratio: 1.2 (calc) (ref 1.0–2.5)
ALT: 12 U/L (ref 9–46)
AST: 16 U/L (ref 10–35)
Albumin: 3.8 g/dL (ref 3.6–5.1)
Alkaline phosphatase (APISO): 58 U/L (ref 35–144)
BUN: 13 mg/dL (ref 7–25)
CO2: 24 mmol/L (ref 20–32)
Calcium: 9.1 mg/dL (ref 8.6–10.3)
Chloride: 103 mmol/L (ref 98–110)
Creat: 0.85 mg/dL (ref 0.70–1.18)
Globulin: 3.2 g/dL (calc) (ref 1.9–3.7)
Glucose, Bld: 95 mg/dL (ref 65–99)
Potassium: 4.6 mmol/L (ref 3.5–5.3)
Sodium: 137 mmol/L (ref 135–146)
Total Bilirubin: 0.3 mg/dL (ref 0.2–1.2)
Total Protein: 7 g/dL (ref 6.1–8.1)

## 2019-06-13 LAB — PSA: PSA: 1.6 ng/mL (ref <=4.0)

## 2019-07-08 ENCOUNTER — Ambulatory Visit: Payer: 59 | Admitting: Gastroenterology

## 2019-07-27 ENCOUNTER — Encounter: Payer: Self-pay | Admitting: Internal Medicine

## 2019-07-27 DIAGNOSIS — G459 Transient cerebral ischemic attack, unspecified: Secondary | ICD-10-CM

## 2019-08-10 ENCOUNTER — Encounter: Payer: Self-pay | Admitting: Internal Medicine

## 2019-08-11 ENCOUNTER — Other Ambulatory Visit: Payer: Self-pay | Admitting: Internal Medicine

## 2019-08-11 DIAGNOSIS — H539 Unspecified visual disturbance: Secondary | ICD-10-CM

## 2019-08-27 ENCOUNTER — Encounter: Payer: Self-pay | Admitting: Neurology

## 2019-08-27 ENCOUNTER — Ambulatory Visit (INDEPENDENT_AMBULATORY_CARE_PROVIDER_SITE_OTHER): Payer: 59 | Admitting: Neurology

## 2019-08-27 VITALS — BP 99/64 | HR 73 | Ht 69.0 in | Wt 154.2 lb

## 2019-08-27 DIAGNOSIS — I6990 Unspecified sequelae of unspecified cerebrovascular disease: Secondary | ICD-10-CM | POA: Diagnosis not present

## 2019-08-27 DIAGNOSIS — F0150 Vascular dementia without behavioral disturbance: Secondary | ICD-10-CM | POA: Diagnosis not present

## 2019-08-27 MED ORDER — MEMANTINE HCL 5 MG PO TABS: 10.0000 mg | ORAL_TABLET | Freq: Two times a day (BID) | ORAL | 3 refills | Status: DC

## 2019-08-27 NOTE — Patient Instructions (Signed)
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  His episodes of transient speech disturbance and confusion also seem to have settled down now.  I recommend he increase the Namenda to 10 mg twice daily to see if it is more effective.  I discussed possible side effects and advised him to call me if necessary.  Continue aspirin and Plavix for stroke prevention and maintain aggressive risk factor modification with strict control of lipids with LDL cholesterol goal below 70 mg percent and hypertension with blood pressure goal below 130/90 and lipids with LDL cholesterol goal below 70 mg percent.  I have also counseled the patient to eat slowly to avoid choking with eating.  Check lipid profile hemoglobin A1c today.  Return for follow-up in the future in 6 months or call earlier if necessary.

## 2019-08-27 NOTE — Progress Notes (Signed)
[facility] [address] [place]. [place] [address] [phone]       OFFICE CONSULT NOTE  Christopher. Christopher Murphy Date of Birth:  [birth year] Medical Record Number:  [id number]   Referring MD: Webb Silversmith, NP Reason for Referral: TIA HPI: Initial visit 03/11/2018: Christopher Murphy is a pleasant 74 year old [place] Sikh male who seen today for initial office consultation visit.  He is accompanied by his son and daughter-in-law who provide most of the history.  I have reviewed imaging films in PACS.  The patient has had recurrent transient episodes of speech difficulties for the last 10 years.  The episodes are stereotypical.  The patient struggles to speak at times and get words out.  At times his speech is gibberish and nonsensical.  These typically last only few minutes around 5-10 or so but he has had a couple episodes which lasted for an hour or longer.  During these prolonged episodes he is been found to be confused and disoriented.  Interestingly patient had a similar episode on 04/05/2016 when he was admitted to [facility].  He had MRI scan of the brain at that time which I personally reviewed and shows interesting diffuse hyperintensity throughout the cerebral white matter raising concern for seizures.  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  EEG was apparently not done.  Patient was not started on seizure medications.  Patient has history of left posterior cerebral artery infarct in 2009 in [place].  He was placed on a combination tablet of aspirin and Plavix as well as medication for lipids.  He had cognitive impairment and mild memory loss and was started on Namenda as well.  He is presently on Namenda 5 mg twice daily.  Family states that he is cognitively stable and does have short-term memory difficulties but long-term memory is good.  He is living at home  with family.  He needs only mild help with activities of daily living.  He can ambulate independently.  He has not had any witnessed generalized tonic-clonic seizure significant head injury or loss of consciousness.  The patient actually had gone to [place] following this episode in December 2017 for nearly 3 years before he came back.  He was seen by neurologist there but details are not known.  He was never placed on seizure medications. Update 08/27/2019 : He returns for follow-up after last visit a year and half ago.  Is accompanied by his son.  The patient continues to have mild memory and cognitive difficulties which appear to be unchanged and not particularly progressive.  He remains on Namenda 5 mg twice daily which is tolerating well.  He has never tried a higher dose.  Patient is not had many episodes of transient speech difficulties since her last visit.  He did undergo MRI scan of the brain on 03/31/2018 which showed old left PCA as well as few remote age lacunar infarcts and changes of small vessel disease.  No acute abnormalities.  MRA of the brain and neck both did not show any large vessel stenosis or occlusion.  EEG on 03/25/2018 was normal.  Patient had a colonoscopy done on 04/30/2019 and had a 3 mm polyp excised.  He has been having some swallowing difficulties of late and saw gastroenterologist in [place].  Modified barium swallow was done by speech therapy there but I do not have the report but the patient's son  was informed that it was fine.  He has no new complaints. ROS:   14 system review of systems is positive for speech difficulties, memory loss, confusion and all other systems negative  PMH:  Past Medical History:  Diagnosis Date  . Blood clots in brain 2009  . Stroke [facility])     Social History:  Social History   Socioeconomic History  . Marital status: Married    Spouse name: Not on file  . Number of children: Not on file  . Years of education: Not on file  . Highest  education level: Not on file  Occupational History  . Not on file  Tobacco Use  . Smoking status: Never Smoker  . Smokeless tobacco: Never Used  Substance and Sexual Activity  . Alcohol use: No  . Drug use: No  . Sexual activity: Not on file  Other Topics Concern  . Not on file  Social History Narrative  . Not on file   Social Determinants of Health   Financial Resource Strain:   . Difficulty of Paying Living Expenses:   Food Insecurity:   . Worried About Charity fundraiser in the Last Year:   . Arboriculturist in the Last Year:   Transportation Needs:   . Film/video editor (Medical):   [place] Kitchen Lack of Transportation (Non-Medical):   Physical Activity:   . Days of Exercise per Week:   . Minutes of Exercise per Session:   Stress:   . Feeling of Stress :   Social Connections:   . Frequency of Communication with Friends and Family:   . Frequency of Social Gatherings with Friends and Family:   . Attends Religious Services:   . Active Member of Clubs or Organizations:   . Attends Archivist Meetings:   Marland Kitchen Marital Status:   Intimate Partner Violence:   . Fear of Current or Ex-Partner:   . Emotionally Abused:   Marland Kitchen Physically Abused:   . Sexually Abused:     Medications:   Current Outpatient Medications on File Prior to Visit  Medication Sig Dispense Refill  . aspirin 81 MG EC tablet Take 1 tablet (81 mg total) by mouth daily. 90 tablet 3  . cetirizine (ZYRTEC) 10 MG tablet Take 1 tablet (10 mg total) by mouth daily. 30 tablet 11  . clopidogrel (PLAVIX) 75 MG tablet Take 1 tablet (75 mg total) by mouth daily. 90 tablet 3  . [facility] ASPIRIN LOW DOSE 81 MG EC tablet SMARTSIG:1 Tablet(s) By Mouth Daily    . FA-B6-B12-Omega 3-Phytosterols (TALIVA) 1 MG CAPS Take by mouth.    . fluticasone (FLONASE) 50 MCG/ACT nasal spray Place 2 sprays into both nostrils daily. 16 g 6  . folic acid (FOLVITE) 1 MG tablet Take 1 mg by mouth daily.    Christopher Faster Glycol-Propyl Glycol  (SYSTANE) 0.4-0.3 % SOLN Apply 1 drop to eye 2 (two) times daily. 30 mL 5  . rosuvastatin (CRESTOR) 20 MG tablet Take 1 tablet (20 mg total) by mouth daily. 90 tablet 3  . tamsulosin (FLOMAX) 0.4 MG CAPS capsule Take 1 capsule (0.4 mg total) by mouth daily. 90 capsule 3   No current facility-administered medications on file prior to visit.    Allergies:  No Known Allergies  Physical Exam General: Frail elderly [place] Sikh male, seated, in no evident distress Head: head normocephalic and atraumatic.   Neck: supple with no carotid or supraclavicular bruits Cardiovascular: regular rate and rhythm,  soft ejection systolic murmur. Musculoskeletal: no deformity Skin:  no rash/petichiae Vascular:  Normal pulses all extremities  Neurologic Exam Mental Status: Awake and fully alert. Oriented to place and time. Recent and remote memory diminished. Attention span, concentration and fund of knowledge slightly reduced.  Mini-Mental status exam not done.  Diminished recall 1/3.  Mood and affect appropriate.  Able to name only 5 animals which can walk on 4 legs.  Able to copy intersecting pentagons well.  Clock drawing 4/4 Cranial Nerves: Fundoscopic exam reveals sharp disc margins. Pupils equal, briskly reactive to light. Extraocular movements full without nystagmus mild mechanical ptosis of the left eye.. Visual fields full to confrontation. Hearing intact. Facial sensation intact. Face, tongue, palate moves normally and symmetrically.  Motor: Normal bulk and tone. Normal strength in all tested extremity muscles. Sensory.: intact to touch , pinprick , position and vibratory sensation.  Coordination: Rapid alternating movements normal in all extremities. Finger-to-nose and heel-to-shin performed accurately bilaterally. Gait and Station: Arises from chair without difficulty. Stance is normal. Gait demonstrates normal stride length and balance .  Drags right leg slightly while walking.     Reflexes: 1+ and symmetric. Toes downgoing.       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  Vascular risk factors of hyperlipidemia, age and cerebrovascular disease.     PLAN: I had a long discussion with the patient and his son regarding his mild dementia and cognitive impairment following his remote left PCA stroke which appears to be stable.  His episodes of transient speech disturbance and confusion also seem to have settled down now.  I recommend he increase the Namenda to 10 mg twice daily to see if it is more effective.  I discussed possible side effects and advised him to call me if necessary.  Continue aspirin and Plavix for stroke prevention and maintain aggressive risk factor modification with strict control of lipids with LDL cholesterol goal below 70 mg percent and hypertension with blood pressure goal below 130/90 and lipids with LDL cholesterol goal below 70 mg percent.  I have also counseled the patient to eat slowly to avoid choking with eating.  Check lipid profile hemoglobin A1c today.  Return for follow-up in the future in 6 months or call earlier if necessary. Greater than 50% time during this 35-minute visit was spent on counseling and coordination of care about his episodes of recurrent speech difficulties, remote TIA and mild dementia and answering questions.  He will return for follow-up after his return from there hence no scheduled follow-up appointment was made today. Antony Contras, MD  [facility] [address] [place], [place] [address]  Phone [phone] Fax  [phone] Note: This document was prepared with digital dictation and possible smart phrase technology. Any transcriptional errors that result from this process are unintentional.

## 2019-08-28 LAB — LIPID PANEL
Chol/HDL Ratio: 3.1 ratio (ref 0.0–5.0)
Cholesterol, Total: 107 mg/dL (ref 100–199)
HDL: 35 mg/dL — ABNORMAL LOW (ref >39)
LDL Chol Calc (NIH): 45 mg/dL (ref 0–99)
Triglycerides: 160 mg/dL — ABNORMAL HIGH (ref 0–149)
VLDL Cholesterol Cal: 27 mg/dL (ref 5–40)

## 2019-08-28 LAB — HEMOGLOBIN A1C
Est. average glucose Bld gHb Est-mCnc: 111 mg/dL
Hgb A1c MFr Bld: 5.5 % (ref 4.8–5.6)

## 2019-09-01 NOTE — Progress Notes (Signed)
Kindly advise the patient that screening lab work for diabetes is satisfactory and cholesterol is borderline.  No changes necessary

## 2019-09-13 ENCOUNTER — Encounter: Payer: Self-pay | Admitting: Internal Medicine

## 2019-09-13 DIAGNOSIS — R053 Chronic cough: Secondary | ICD-10-CM

## 2019-09-15 ENCOUNTER — Other Ambulatory Visit: Payer: Self-pay

## 2019-09-15 MED ORDER — MEMANTINE HCL 10 MG PO TABS: 10.0000 mg | ORAL_TABLET | Freq: Two times a day (BID) | ORAL | 1 refills | Status: DC

## 2019-09-15 NOTE — Progress Notes (Signed)
Memantine prescription sent to pharmacy.Fax stated insurance will only pay for 2 tablets a day. Pt was order 2 tabs twice a day at 5mg . Medication resent with 10mg  bid.

## 2019-09-23 ENCOUNTER — Ambulatory Visit (HOSPITAL_COMMUNITY)
Admission: EM | Admit: 2019-09-23 | Discharge: 2019-09-23 | Disposition: A | Discharge status: Home / Self Care | Payer: 59 | Attending: Emergency Medicine | Admitting: Emergency Medicine

## 2019-09-23 ENCOUNTER — Encounter (HOSPITAL_COMMUNITY): Payer: Self-pay | Admitting: Emergency Medicine

## 2019-09-23 DIAGNOSIS — H6983 Other specified disorders of Eustachian tube, bilateral: Secondary | ICD-10-CM

## 2019-09-23 DIAGNOSIS — H9313 Tinnitus, bilateral: Secondary | ICD-10-CM

## 2019-09-23 MED ORDER — FLUTICASONE PROPIONATE 50 MCG/ACT NA SUSP: 1.0000 | Freq: Every day | NASAL | 0 refills | Status: DC

## 2019-09-23 MED ORDER — AMOXICILLIN 500 MG PO CAPS: 500.0000 mg | ORAL_CAPSULE | Freq: Three times a day (TID) | ORAL | 0 refills | Status: AC

## 2019-09-23 NOTE — ED Triage Notes (Signed)
Pts daughter-in -law states that he was complaining of ringing/buzzing in the ears. Pt daughter in law was trying trying to clean his ears she states he usually wears hearing aid in both ears.

## 2019-09-23 NOTE — Discharge Instructions (Signed)
Begin flonase nasal spray 1-2 spray each nostril daily x 2 weeks Amoxicillin three times daily for 1 week  Follow up with primary care or ENT if symptoms continuing

## 2019-09-24 NOTE — ED Provider Notes (Signed)
[facility]    CSN: [id number] Arrival date & time: 09/23/19  1926      History   Chief Complaint Chief Complaint  Patient presents with  . Tinnitus    HPI Christopher Murphy is a 74 y.o. male history of prior CVA presenting today for evaluation of tinnitus.  Daughter-in-law reports that beginning this afternoon he has been complaining of a constant ringing/buzzing sensation in both ears.  Typically wears hearing aids, attempted taking these out, but sensation continues.  Denies associated pain.  Reports has frequent issues with wax buildup.  Denies any recent URI symptoms of cough congestion or sore throat.  Denies headaches or vision changes.  HPI  Past Medical History:  Diagnosis Date  . Blood clots in brain 2009  . Stroke [facility])     Patient Active Problem List   Diagnosis Date Noted  . TIA (transient ischemic attack) 04/05/2016    Past Surgical History:  Procedure Laterality Date  . COLONOSCOPY WITH PROPOFOL N/A 04/30/2019   Procedure: COLONOSCOPY WITH PROPOFOL;  Surgeon: Jonathon Bellows, MD;  Location: [facility] ENDOSCOPY;  Service: Gastroenterology;  Laterality: N/A;       Home Medications    Prior to Admission medications   Medication Sig Start Date End Date Taking? Authorizing Provider  amoxicillin (AMOXIL) 500 MG capsule Take 1 capsule (500 mg total) by mouth 3 (three) times daily for 7 days. 09/23/19 09/30/19  Kerilyn Cortner C, PA-C  aspirin 81 MG EC tablet Take 1 tablet (81 mg total) by mouth daily. 06/12/19   [name], NP  cetirizine (ZYRTEC) 10 MG tablet Take 1 tablet (10 mg total) by mouth daily. 06/11/17   [name], NP  clopidogrel (PLAVIX) 75 MG tablet Take 1 tablet (75 mg total) by mouth daily. 06/12/19   [name], NP  [facility] ASPIRIN LOW DOSE 81 MG EC tablet SMARTSIG:1 Tablet(s) By Mouth Daily 06/12/19   [provider]  FA-B6-B12-Omega 3-Phytosterols (TALIVA) 1 MG CAPS Take by mouth.    [provider]  fluticasone  (FLONASE) 50 MCG/ACT nasal spray Place 1-2 sprays into both nostrils daily for 7 days. 09/23/19 09/30/19  Detra Bores C, PA-C  folic acid (FOLVITE) 1 MG tablet Take 1 mg by mouth daily.    [provider]  memantine (NAMENDA) 10 MG tablet Take 1 tablet (10 mg total) by mouth 2 (two) times daily. 09/15/19   Garvin Fila, MD  Polyethyl Glycol-Propyl Glycol (SYSTANE) 0.4-0.3 % SOLN Apply 1 drop to eye 2 (two) times daily. 06/12/16   [name], NP  rosuvastatin (CRESTOR) 20 MG tablet Take 1 tablet (20 mg total) by mouth daily. 06/12/19   [name], NP  tamsulosin (FLOMAX) 0.4 MG CAPS capsule Take 1 capsule (0.4 mg total) by mouth daily. 06/12/19   [name], NP    Family History Family History  Problem Relation Age of Onset  . AAA (abdominal aortic aneurysm) Neg Hx     Social History Social History   Tobacco Use  . Smoking status: Never Smoker  . Smokeless tobacco: Never Used  Vaping Use  . Vaping Use: Never used  Substance Use Topics  . Alcohol use: No  . Drug use: No     Allergies   Patient has no known allergies.   Review of Systems Review of Systems  Constitutional: Negative for activity change, appetite change, chills, fatigue and fever.  HENT: Positive for tinnitus. Negative for congestion, ear pain, rhinorrhea,  sinus pressure, sore throat and trouble swallowing.   Eyes: Negative for discharge and redness.  Respiratory: Negative for cough, chest tightness and shortness of breath.   Cardiovascular: Negative for chest pain.  Gastrointestinal: Negative for abdominal pain, diarrhea, nausea and vomiting.  Musculoskeletal: Negative for myalgias.  Skin: Negative for rash.  Neurological: Negative for dizziness, light-headedness and headaches.     Physical Exam Triage Vital Signs ED Triage Vitals  Enc Vitals Group     BP 09/23/19 2044 136/67     Pulse Rate 09/23/19 2044 (!) 58     Resp 09/23/19 2044 16     Temp 09/23/19 2044 97.7 F (36.5 C)      Temp Source 09/23/19 2044 Oral     SpO2 09/23/19 2044 100 %     Weight --      Height --      Head Circumference --      Peak Flow --      Pain Score 09/23/19 2041 0     Pain Loc --      Pain Edu? --      Excl. in [place]? --    No data found.  Updated Vital Signs BP 136/67 (BP Location: Left Arm)   Pulse (!) 58   Temp 97.7 F (36.5 C) (Oral)   Resp 16   SpO2 100%   Visual Acuity Right Eye Distance:   Left Eye Distance:   Bilateral Distance:    Right Eye Near:   Left Eye Near:    Bilateral Near:     Physical Exam Vitals and nursing note reviewed.  Constitutional:      Appearance: He is well-developed.     Comments: No acute distress  HENT:     Head: Normocephalic and atraumatic.     Ears:     Comments: Mild amount of cerumen in canals bilaterally, TMs intact, inferior portions of left TM appears opaque and slightly dull, right TM opaque diffusely, but good bony landmarks    Nose: Nose normal.     Comments: Nasal mucosa pink, mildly swollen turbinates    Mouth/Throat:     Comments: Oral mucosa pink and moist, no tonsillar enlargement or exudate. Posterior pharynx patent and nonerythematous, no uvula deviation or swelling. Normal phonation. Eyes:     Conjunctiva/sclera: Conjunctivae normal.  Neck:     Comments: Full active range of motion of neck, no neck tenderness, no overlying swelling or erythema No carotid bruits auscultated Cardiovascular:     Rate and Rhythm: Normal rate.  Pulmonary:     Effort: Pulmonary effort is normal. No respiratory distress.     Comments: Breathing comfortably at rest, CTABL, no wheezing, rales or other adventitious sounds auscultated Abdominal:     General: There is no distension.  Musculoskeletal:        General: Normal range of motion.     Cervical back: Neck supple.  Skin:    General: Skin is warm and dry.  Neurological:     Mental Status: He is alert and oriented to person, place, and time.      UC Treatments / Results   Labs (all labs ordered are listed, but only abnormal results are displayed) Labs Reviewed - No data to display  EKG   Radiology No results found.  Procedures Procedures (including critical care time)  Medications Ordered in UC Medications - No data to display  Initial Impression / Assessment and Plan / UC Course  I have reviewed the triage vital  signs and the nursing notes.  Pertinent labs & imaging results that were available during my care of the patient were reviewed by me and considered in my medical decision making (see chart for details).     We will treat for eustachian tube dysfunction initiate on the Flonase, given opaque appearance of TMs.  Cover for otitis media with amoxicillin.  Recommended following up with ENT if symptoms persisting.  Discussed strict return precautions. Patient verbalized understanding and is agreeable with plan.  Final Clinical Impressions(s) / UC Diagnoses   Final diagnoses:  Tinnitus of both ears  Dysfunction of both eustachian tubes     Discharge Instructions     Begin flonase nasal spray 1-2 spray each nostril daily x 2 weeks Amoxicillin three times daily for 1 week  Follow up with primary care or ENT if symptoms continuing   ED Prescriptions    Medication Sig Dispense Auth. Provider   fluticasone (FLONASE) 50 MCG/ACT nasal spray Place 1-2 sprays into both nostrils daily for 7 days. 1 g Shawne Eskelson C, PA-C   amoxicillin (AMOXIL) 500 MG capsule Take 1 capsule (500 mg total) by mouth 3 (three) times daily for 7 days. 21 capsule Sherrell Weir, [place] C, PA-C     PDMP not reviewed this encounter.   Roshawnda Pecora, [place] C, PA-C 09/24/19 1013

## 2019-10-09 ENCOUNTER — Ambulatory Visit: Payer: 59 | Admitting: Internal Medicine

## 2019-10-13 ENCOUNTER — Ambulatory Visit: Payer: 59 | Admitting: Internal Medicine

## 2019-10-14 ENCOUNTER — Ambulatory Visit: Payer: 59 | Admitting: Internal Medicine

## 2019-10-19 ENCOUNTER — Ambulatory Visit (INDEPENDENT_AMBULATORY_CARE_PROVIDER_SITE_OTHER): Payer: 59 | Admitting: Internal Medicine

## 2019-10-19 ENCOUNTER — Encounter: Payer: Self-pay | Admitting: Internal Medicine

## 2019-10-19 ENCOUNTER — Other Ambulatory Visit: Payer: Self-pay

## 2019-10-19 DIAGNOSIS — R053 Chronic cough: Secondary | ICD-10-CM | POA: Insufficient documentation

## 2019-10-19 DIAGNOSIS — R05 Cough: Secondary | ICD-10-CM

## 2019-10-19 DIAGNOSIS — R059 Cough, unspecified: Secondary | ICD-10-CM

## 2019-10-19 LAB — CBC WITH DIFFERENTIAL/PLATELET
Basophils Absolute: 0 10*3/uL (ref 0.0–0.1)
Basophils Relative: 0.5 % (ref 0.0–3.0)
Eosinophils Absolute: 0.2 10*3/uL (ref 0.0–0.7)
Eosinophils Relative: 2.9 % (ref 0.0–5.0)
HCT: 40 % (ref 39.0–52.0)
Hemoglobin: 13.1 g/dL (ref 13.0–17.0)
Lymphocytes Relative: 29.3 % (ref 12.0–46.0)
Lymphs Abs: 1.9 10*3/uL (ref 0.7–4.0)
MCHC: 32.6 g/dL (ref 30.0–36.0)
MCV: 82.9 fl (ref 78.0–100.0)
Monocytes Absolute: 0.6 10*3/uL (ref 0.1–1.0)
Monocytes Relative: 8.9 % (ref 3.0–12.0)
Neutro Abs: 3.9 10*3/uL (ref 1.4–7.7)
Neutrophils Relative %: 58.4 % (ref 43.0–77.0)
Platelets: 239 10*3/uL (ref 150.0–400.0)
RBC: 4.82 Mil/uL (ref 4.22–5.81)
RDW: 13.8 % (ref 11.5–15.5)
WBC: 6.6 10*3/uL (ref 4.0–10.5)

## 2019-10-19 MED ORDER — FAMOTIDINE 20 MG PO TABS: ORAL_TABLET | ORAL | 11 refills | Status: DC

## 2019-10-19 MED ORDER — PANTOPRAZOLE SODIUM 40 MG PO TBEC: 40.0000 mg | DELAYED_RELEASE_TABLET | Freq: Every day | ORAL | 2 refills | Status: DC

## 2019-10-19 NOTE — Progress Notes (Signed)
Christopher Murphy, male    DOB: Nov 08, 1945, 74 y.o.   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 Pulmonary Consult    Referred by Webb Silversmith, NP. Pt c/o cough x 8 months- occ prod with clear sputum.    onset of worse cough was w/n a week or two p returned from last trip to [place] in Jan 2020 - early November 2020  > eval with neg covid testing on 02/16/2019 and 04/27/19 and cough ever since/ minimal mucoid production with throat clearing and assoc sense of pnds  Dyspnea:  Not limited by breathing from desired activities   Cough: sporadic  Day > noct  Sleep: able to sleep fine flat no flare  SABA use: none Taking cough drops from [place] ? Contents   No obvious day to day or daytime variability or assoc excess/ purulent sputum or mucus plugs or hemoptysis or cp or chest tightness, subjective wheeze or overt sinus or hb symptoms.   Sleeping as above  without nocturnal  or early am exacerbation  of respiratory  c/o's or need for noct saba. Also denies any obvious fluctuation of symptoms with weather or environmental changes or other aggravating or alleviating factors except as outlined above   No unusual exposure hx or h/o childhood pna/ asthma or knowledge of premature birth.  Current Allergies, Complete Past Medical History, Past Surgical History, Family History, and Social History were reviewed in [employer] record.   ROS  The following are not active complaints unless bolded Hoarseness, sore throat, dysphagia,  dental problems, itching, sneezing,  nasal congestion or discharge of excess mucus or purulent secretions, ear ache,   fever, chills, sweats, unintended wt loss or wt gain, classically pleuritic or exertional cp,  orthopnea pnd or arm/hand swelling  or leg swelling, presyncope, palpitations, abdominal pain, anorexia, nausea, vomiting, diarrhea  or change in bowel habits or change in bladder habits, change in stools or change in urine, dysuria, hematuria,  rash, arthralgias, visual complaints, headache, numbness, weakness or ataxia or problems with walking or coordination,  change in mood or  memory.             Past Medical History:  Diagnosis Date  . Blood clots in brain 2009  . Stroke [facility])     Outpatient Medications Prior to Visit  Medication Sig Dispense Refill  . benzonatate (TESSALON) 200 MG capsule Completed/ helped some    . cefdinir (OMNICEF) 300 MG capsule Completed     . cetirizine (ZYRTEC) 10 MG tablet Take 1 tablet (10 mg total) by mouth daily. 30 tablet 11  . clopidogrel (PLAVIX) 75 MG tablet Take 1 tablet (75 mg total) by mouth daily. 90 tablet 3  . FA-B6-B12-Omega 3-Phytosterols (TALIVA) 1 MG CAPS Take by mouth.    . fluticasone (FLONASE) 50 MCG/ACT nasal spray Place 1-2 sprays into both nostrils daily for 7 days. 1 g 0  . folic acid (FOLVITE) 1 MG tablet Take 1 mg by mouth daily.    Marland Kitchen  memantine (NAMENDA) 10 MG tablet Take 1 tablet (10 mg total) by mouth 2 (two) times daily. 180 tablet 1  . Polyethyl Glycol-Propyl Glycol (SYSTANE) 0.4-0.3 % SOLN Apply 1 drop to eye 2 (two) times daily. 30 mL 5  . rosuvastatin (CRESTOR) 20 MG tablet Take 1 tablet (20 mg total) by mouth daily. 90 tablet 3  . aspirin 81 MG EC tablet Take 1 tablet (81 mg total) by mouth daily. 90 tablet 3  . CVS ASPIRIN LOW DOSE 81 MG EC tablet SMARTSIG:1 Tablet(s) By Mouth Daily    . tamsulosin (FLOMAX) 0.4 MG CAPS capsule Take 1 capsule (0.4 mg total) by mouth daily. 90 capsule 3   No facility-administered  medications prior to visit.     Objective:     BP 110/68 (BP Location: Left Arm, Cuff Size: Normal)   Pulse 81   Temp 98.2 F (36.8 C) (Oral)   Ht 5\' 9"  (1.753 m)   Wt 151 lb (68.5 kg)   SpO2 98% Comment: on RA  BMI 22.30 kg/m   SpO2: 98 % (on RA)   Stoic amb [place] male nad/ minimal spont cough / some throat clearing     HEENT : pt wearing mask not removed for exam due to covid -19 concerns.    NECK :  without JVD/Nodes/TM/ nl carotid upstrokes bilaterally   LUNGS: no acc muscle use,  Nl contour chest which is clear to A and P bilaterally without cough on insp or exp maneuvers   CV:  RRR  no s3 or murmur or increase in P2, and no edema   ABD:  soft and nontender with nl inspiratory excursion in the supine position. No bruits or organomegaly appreciated, bowel sounds nl  MS:    ext warm without deformities, calf tenderness, cyanosis or clubbing No obvious joint restrictions   SKIN: warm and dry without lesions    NEURO:  Very minimal interaction even with daughter in law, ambulatory with no obvious motor or cerebellar deficits apparent     I personally reviewed images and agree with radiology impression as follows:  CXR:   PA and Lateral  06/12/19  No active cardiopulmonary disease.      Assessment   Chronic cough Never/ smoker/ onset p CVA 2009 - Worse since   fall 2020 p travel to [place] (w/in a few weeks of return) and no better on prilosec otc - neg MBS 05/08/19 - Eval by ENT Einar Pheasant MD 10/07/19 pos erythema larynx rec omnicef/ prednisone /tessilon > some better  - 10/19/2019 rec max gerd rx / stop [place] cough drops/ sinus CT >>>   The most common causes of chronic cough in immunocompetent adults include the following: upper airway cough syndrome (UACS), previously referred to as postnasal drip syndrome (PNDS), which is caused by variety of rhinosinus conditions; (2) asthma; (3) GERD; (4) chronic bronchitis from cigarette smoking or other inhaled environmental  irritants; (5) nonasthmatic eosinophilic bronchitis; and (6) bronchiectasis.   These conditions, singly or in combination, have accounted for up to 94% of the causes of chronic cough in prospective studies.   Other conditions have constituted no >6% of the causes in prospective studies These have included bronchogenic carcinoma, chronic interstitial pneumonia, sarcoidosis, left ventricular failure, ACEI-induced cough, and aspiration from a condition associated with pharyngeal dysfunction.    Chronic cough is often simultaneously caused by more than one condition. A single cause has been found from 38 to 82% of the time, multiple causes from 18 to  62%. Multiply caused cough has been the result of three diseases up to 42% of the time.    Of the three most common causes of  Sub-acute / recurrent or chronic cough, only one (GERD)  can actually contribute to/ trigger  the other two (asthma and post nasal drip syndrome)  and perpetuate the cylce of cough.  While not intuitively obvious, many patients with chronic low grade reflux do not cough until there is a primary insult that disturbs the protective epithelial barrier and exposes sensitive nerve endings.     This is typically post  viral but can due to PNDS and  either may apply here.   The point is that once this occurs, it is difficult to eliminate the cycle  using anything but a maximally effective acid suppression regimen at least in the short run, accompanied by an appropriate diet to address non acid GERD and control / eliminate the cough itself with use of non-mint/menthol hard rock candies and await ct sinus - if neg may next consider trial of gabapentin to eliminate cycle of throat clearing/coughing .  Discussed in detail all the  indications, usual  risks and alternatives  relative to the benefits with patient's daughter in law  who agrees to proceed with Rx as outlined.          Each maintenance medication was reviewed in detail  including emphasizing most importantly the difference between maintenance and prns and under what circumstances the prns are to be triggered using an action plan format where appropriate.  Advised on how to do med reconciliation at home (like balancing a check book)  Total time for H and P, chart review, counseling, and generating customized AVS unique to this office visit / charting = 45 min                    Christinia Gully, MD 10/19/2019

## 2019-10-19 NOTE — Patient Instructions (Addendum)
Pantoprazole (protonix) 40 mg   Take  30-60 min before first meal of the day and Pepcid (famotidine)  20 mg one after supper  until return to office - this is the best way to tell whether stomach acid is contributing to your problem.    GERD (REFLUX)  is an extremely common cause of respiratory symptoms just like yours , many times with no obvious heartburn at all.    It can be treated with medication, but also with lifestyle changes including elevation of the head of your bed (ideally with 6 -8inch blocks under the headboard of your bed),  Smoking cessation, avoidance of late meals, excessive alcohol, and avoid fatty foods, chocolate, peppermint, colas, red wine, and acidic juices such as orange juice.  NO MINT OR MENTHOL PRODUCTS SO NO COUGH DROPS  USE SUGARLESS CANDY INSTEAD (Jolley ranchers or Stover's or Life Savers) or even ice chips will also do - the key is to swallow to prevent all throat clearing. NO OIL BASED VITAMINS - use powdered substitutes.  Avoid fish oil when coughing.   Please remember to go to the lab department   for your tests - we will call you with the results when they are available.     We will schedule Sinus CT and call you with results.  Be sure the medication list is correct/ complete and call with any discrepancy    Please schedule a follow up office visit in 6 weeks, call sooner if needed

## 2019-10-20 ENCOUNTER — Encounter: Payer: Self-pay | Admitting: Internal Medicine

## 2019-10-20 LAB — RESPIRATORY ALLERGY PROFILE REGION II ~~LOC~~

## 2019-10-20 LAB — INTERPRETATION:

## 2019-10-20 NOTE — Assessment & Plan Note (Addendum)
Never/ smoker/ onset p CVA 2009 - Worse since   fall 2020 p travel to [place] (w/in a few weeks of return) and no better on prilosec otc - neg MBS 05/08/19 - Eval by ENT Einar Pheasant MD 10/07/19 pos erythema larynx rec omnicef/ prednisone /tessilon > some better  - 10/19/2019 rec max gerd rx / stop [place] cough drops/ sinus CT >>>   The most common causes of chronic cough in immunocompetent adults include the following: upper airway cough syndrome (UACS), previously referred to as postnasal drip syndrome (PNDS), which is caused by variety of rhinosinus conditions; (2) asthma; (3) GERD; (4) chronic bronchitis from cigarette smoking or other inhaled environmental irritants; (5) nonasthmatic eosinophilic bronchitis; and (6) bronchiectasis.   These conditions, singly or in combination, have accounted for up to 94% of the causes of chronic cough in prospective studies.   Other conditions have constituted no >6% of the causes in prospective studies These have included bronchogenic carcinoma, chronic interstitial pneumonia, sarcoidosis, left ventricular failure, ACEI-induced cough, and aspiration from a condition associated with pharyngeal dysfunction.    Chronic cough is often simultaneously caused by more than one condition. A single cause has been found from 38 to 82% of the time, multiple causes from 18 to 62%. Multiply caused cough has been the result of three diseases up to 42% of the time.    Of the three most common causes of  Sub-acute / recurrent or chronic cough, only one (GERD)  can actually contribute to/ trigger  the other two (asthma and post nasal drip syndrome)  and perpetuate the cylce of cough.  While not intuitively obvious, many patients with chronic low grade reflux do not cough until there is a primary insult that disturbs the protective epithelial barrier and exposes sensitive nerve endings.     This is typically post  viral but can due to PNDS and  either may apply here.   The point is that  once this occurs, it is difficult to eliminate the cycle  using anything but a maximally effective acid suppression regimen at least in the short run, accompanied by an appropriate diet to address non acid GERD and control / eliminate the cough itself with use of non-mint/menthol hard rock candies and await ct sinus - if neg may next consider trial of gabapentin to eliminate cycle of throat clearing/coughing .  Discussed in detail all the  indications, usual  risks and alternatives  relative to the benefits with patient's daughter in law  who agrees to proceed with Rx as outlined.          Each maintenance medication was reviewed in detail including emphasizing most importantly the difference between maintenance and prns and under what circumstances the prns are to be triggered using an action plan format where appropriate.  Advised on how to do med reconciliation at home (like balancing a check book)  Total time for H and P, chart review, counseling, and generating customized AVS unique to this office visit / charting = 45 min

## 2019-10-21 NOTE — Progress Notes (Signed)
Spoke with the pt's daughter in law ok per DPR and notified of recs per Dr Melvyn Novas

## 2019-10-22 ENCOUNTER — Other Ambulatory Visit: Payer: Self-pay | Admitting: Internal Medicine

## 2019-10-22 ENCOUNTER — Telehealth: Payer: Self-pay | Admitting: Internal Medicine

## 2019-10-22 DIAGNOSIS — R053 Chronic cough: Secondary | ICD-10-CM

## 2019-10-22 NOTE — Telephone Encounter (Signed)
After pt's OV, CT maxillofacial was ordered. [facility], please advise if you can see if the authorization request that was submitted 7/13 was from this.

## 2019-10-22 NOTE — Telephone Encounter (Signed)
I checked with [facility] denied sinus CT and MW stated to not worry about CT and send to ENT.  ENT referral was placed and has been sent to [facility]NT.

## 2019-10-22 NOTE — Telephone Encounter (Signed)
Spoke with rep and [employer] and they informed me that the sinus CT was denied  We were already aware and have made ENT referral per Dr Melvyn Novas request

## 2019-10-26 ENCOUNTER — Telehealth: Payer: Self-pay | Admitting: Internal Medicine

## 2019-10-26 DIAGNOSIS — R053 Chronic cough: Secondary | ICD-10-CM

## 2019-10-26 NOTE — Telephone Encounter (Signed)
Received a fax from [facility] ENT they do not take [employer] is there another preference to send this order to.  Dr. Melvyn Novas please advise.

## 2019-10-26 NOTE — Telephone Encounter (Signed)
Sending this to Dr Melvyn Novas to see if he has a another office in mind

## 2019-10-27 NOTE — Telephone Encounter (Signed)
Attempted to call patient, no answer. Placed ambulatory referral for ENT [place].

## 2019-10-27 NOTE — Telephone Encounter (Signed)
Try Christopher Murphy

## 2019-10-28 ENCOUNTER — Emergency Department: Payer: 59

## 2019-10-28 ENCOUNTER — Observation Stay
Admission: EM | Admit: 2019-10-28 | Discharge: 2019-10-29 | Disposition: A | Discharge status: Home / Self Care | Payer: 59 | Attending: Internal Medicine | Admitting: Internal Medicine

## 2019-10-28 ENCOUNTER — Observation Stay: Payer: 59

## 2019-10-28 ENCOUNTER — Observation Stay (HOSPITAL_BASED_OUTPATIENT_CLINIC_OR_DEPARTMENT_OTHER)
Admit: 2019-10-28 | Discharge: 2019-10-28 | Disposition: A | Discharge status: Home / Self Care | Payer: 59 | Attending: Internal Medicine | Admitting: Internal Medicine

## 2019-10-28 ENCOUNTER — Ambulatory Visit: Payer: 59 | Admitting: Internal Medicine

## 2019-10-28 ENCOUNTER — Other Ambulatory Visit: Payer: Self-pay

## 2019-10-28 DIAGNOSIS — Z20822 Contact with and (suspected) exposure to covid-19: Secondary | ICD-10-CM | POA: Diagnosis not present

## 2019-10-28 DIAGNOSIS — G459 Transient cerebral ischemic attack, unspecified: Principal | ICD-10-CM | POA: Insufficient documentation

## 2019-10-28 DIAGNOSIS — R05 Cough: Secondary | ICD-10-CM | POA: Diagnosis not present

## 2019-10-28 DIAGNOSIS — R4182 Altered mental status, unspecified: Secondary | ICD-10-CM | POA: Diagnosis not present

## 2019-10-28 DIAGNOSIS — I639 Cerebral infarction, unspecified: Secondary | ICD-10-CM

## 2019-10-28 DIAGNOSIS — Z7982 Long term (current) use of aspirin: Secondary | ICD-10-CM | POA: Diagnosis not present

## 2019-10-28 DIAGNOSIS — F0150 Vascular dementia without behavioral disturbance: Secondary | ICD-10-CM | POA: Insufficient documentation

## 2019-10-28 DIAGNOSIS — R319 Hematuria, unspecified: Secondary | ICD-10-CM | POA: Diagnosis not present

## 2019-10-28 DIAGNOSIS — I6529 Occlusion and stenosis of unspecified carotid artery: Secondary | ICD-10-CM

## 2019-10-28 DIAGNOSIS — I351 Nonrheumatic aortic (valve) insufficiency: Secondary | ICD-10-CM | POA: Diagnosis not present

## 2019-10-28 DIAGNOSIS — Z79899 Other long term (current) drug therapy: Secondary | ICD-10-CM | POA: Insufficient documentation

## 2019-10-28 DIAGNOSIS — Z8673 Personal history of transient ischemic attack (TIA), and cerebral infarction without residual deficits: Secondary | ICD-10-CM | POA: Diagnosis not present

## 2019-10-28 DIAGNOSIS — R410 Disorientation, unspecified: Secondary | ICD-10-CM | POA: Diagnosis present

## 2019-10-28 LAB — COMPREHENSIVE METABOLIC PANEL
ALT: 10 U/L (ref 0–44)
AST: 15 U/L (ref 15–41)
Albumin: 3.9 g/dL (ref 3.5–5.0)
Alkaline Phosphatase: 50 U/L (ref 38–126)
Anion gap: 7 (ref 5–15)
BUN: 13 mg/dL (ref 8–23)
CO2: 25 mmol/L (ref 22–32)
Calcium: 9 mg/dL (ref 8.9–10.3)
Chloride: 102 mmol/L (ref 98–111)
Creatinine, Ser: 0.88 mg/dL (ref 0.61–1.24)
GFR calc Af Amer: >60 mL/min (ref >60)
GFR calc non Af Amer: >60 mL/min (ref >60)
Glucose, Bld: 91 mg/dL (ref 70–99)
Potassium: 4.2 mmol/L (ref 3.5–5.1)
Sodium: 134 mmol/L — ABNORMAL LOW (ref 135–145)
Total Bilirubin: 0.9 mg/dL (ref 0.3–1.2)
Total Protein: 7.4 g/dL (ref 6.5–8.1)

## 2019-10-28 LAB — URINALYSIS, COMPLETE (UACMP) WITH MICROSCOPIC
Bacteria, UA: NONE SEEN
Bilirubin Urine: NEGATIVE
Glucose, UA: NEGATIVE mg/dL
Ketones, ur: NEGATIVE mg/dL
Leukocytes,Ua: NEGATIVE
Nitrite: NEGATIVE
Protein, ur: NEGATIVE mg/dL
Specific Gravity, Urine: 1.014 (ref 1.005–1.030)
Squamous Epithelial / HPF: NONE SEEN (ref 0–5)
pH: 6 (ref 5.0–8.0)

## 2019-10-28 LAB — CBC
HCT: 39.2 % (ref 39.0–52.0)
Hemoglobin: 12.9 g/dL — ABNORMAL LOW (ref 13.0–17.0)
MCH: 27.1 pg (ref 26.0–34.0)
MCHC: 32.9 g/dL (ref 30.0–36.0)
MCV: 82.4 fL (ref 80.0–100.0)
Platelets: 229 10*3/uL (ref 150–400)
RBC: 4.76 MIL/uL (ref 4.22–5.81)
RDW: 13.7 % (ref 11.5–15.5)
WBC: 6.4 10*3/uL (ref 4.0–10.5)
nRBC: 0 % (ref 0.0–0.2)

## 2019-10-28 LAB — SARS CORONAVIRUS 2 BY RT PCR (HOSPITAL ORDER, PERFORMED IN ~~LOC~~ HOSPITAL LAB): SARS Coronavirus 2: NEGATIVE

## 2019-10-28 MED ORDER — SODIUM CHLORIDE 0.9% FLUSH
3.0000 mL | Freq: Once | INTRAVENOUS | Status: AC
  Administered 2019-10-28: 21:00:00 3 mL via INTRAVENOUS

## 2019-10-28 MED ORDER — STROKE: EARLY STAGES OF RECOVERY BOOK: Freq: Once | Status: AC

## 2019-10-28 MED ORDER — MEMANTINE HCL 5 MG PO TABS
10.0000 mg | ORAL_TABLET | Freq: Two times a day (BID) | ORAL | Status: DC
  Administered 2019-10-28 – 2019-10-29 (×2): 10 mg via ORAL
  Filled 2019-10-28 (×2): qty 2

## 2019-10-28 MED ORDER — FAMOTIDINE 20 MG PO TABS
20.0000 mg | ORAL_TABLET | Freq: Every day | ORAL | Status: DC
  Administered 2019-10-28 – 2019-10-29 (×2): 20 mg via ORAL
  Filled 2019-10-28 (×2): qty 1

## 2019-10-28 MED ORDER — FOLIC ACID 1 MG PO TABS
1.0000 mg | ORAL_TABLET | Freq: Every day | ORAL | Status: DC
  Administered 2019-10-28 – 2019-10-29 (×2): 1 mg via ORAL
  Filled 2019-10-28 (×2): qty 1

## 2019-10-28 MED ORDER — ACETAMINOPHEN 160 MG/5ML PO SOLN
650.0000 mg | ORAL | Status: DC | PRN
  Filled 2019-10-28: qty 20.3

## 2019-10-28 MED ORDER — ACETAMINOPHEN 650 MG RE SUPP: 650.0000 mg | RECTAL | Status: DC | PRN

## 2019-10-28 MED ORDER — SENNOSIDES-DOCUSATE SODIUM 8.6-50 MG PO TABS: 1.0000 | ORAL_TABLET | Freq: Every evening | ORAL | Status: DC | PRN

## 2019-10-28 MED ORDER — SODIUM CHLORIDE 0.9 % IV SOLN: INTRAVENOUS | Status: DC

## 2019-10-28 MED ORDER — ROSUVASTATIN CALCIUM 20 MG PO TABS
20.0000 mg | ORAL_TABLET | Freq: Every day | ORAL | Status: DC
  Administered 2019-10-29: 09:00:00 20 mg via ORAL
  Filled 2019-10-28: qty 1

## 2019-10-28 MED ORDER — POLYVINYL ALCOHOL 1.4 % OP SOLN
1.0000 [drp] | Freq: Two times a day (BID) | OPHTHALMIC | Status: DC
  Administered 2019-10-28 – 2019-10-29 (×2): 1 [drp] via OPHTHALMIC
  Filled 2019-10-28 (×2): qty 15

## 2019-10-28 MED ORDER — LORATADINE 10 MG PO TABS
10.0000 mg | ORAL_TABLET | Freq: Every day | ORAL | Status: DC
  Administered 2019-10-28 – 2019-10-29 (×2): 10 mg via ORAL
  Filled 2019-10-28 (×2): qty 1

## 2019-10-28 MED ORDER — ASPIRIN EC 81 MG PO TBEC
81.0000 mg | DELAYED_RELEASE_TABLET | Freq: Every day | ORAL | Status: DC
  Administered 2019-10-29: 81 mg via ORAL
  Filled 2019-10-28: qty 1

## 2019-10-28 MED ORDER — CLOPIDOGREL BISULFATE 75 MG PO TABS
75.0000 mg | ORAL_TABLET | Freq: Every day | ORAL | Status: DC
  Administered 2019-10-28 – 2019-10-29 (×2): 75 mg via ORAL
  Filled 2019-10-28 (×2): qty 1

## 2019-10-28 MED ORDER — ACETAMINOPHEN 325 MG PO TABS: 650.0000 mg | ORAL_TABLET | ORAL | Status: DC | PRN

## 2019-10-28 NOTE — ED Provider Notes (Signed)
[facility] Emergency Department Provider Note ____________________________________________   First MD Initiated Contact with Patient 10/28/19 1126     (approximate)  I have reviewed the triage vital signs and the nursing notes.  HISTORY  Chief Complaint Altered Mental Status   HPI Christopher Murphy is a 74 y.o. male with disorientation.   Patient is [place] and presents with his daughter due to concerns for disorientation and altered mental status.  Patient history of stroke and is on Plavix for further prevention. Born in [place] and speaks [place] only, has been back and forth between [place] and [place] for the past 8 years. Patient presents with daughter who provides majority of history, as well as patient via interpreter.  Daughter reports that for the past 3-4 days patient has been disoriented and more confused from his baseline. He is normally fully oriented, but has mistakenly been thinking he is in [place] and occasionally having visual hallucinations. Patient reports having headache 3 days ago around the onset of the symptoms, that self resolved in a matter of hours. Patient denies any preceding or associated trauma or injuries.    Past Medical History:  Diagnosis Date  . Blood clots in brain 2009  . Stroke [facility])     Patient Active Problem List   Diagnosis Date Noted  . Chronic cough 10/19/2019  . TIA (transient ischemic attack) 04/05/2016    Past Surgical History:  Procedure Laterality Date  . COLONOSCOPY WITH PROPOFOL N/A 04/30/2019   Procedure: COLONOSCOPY WITH PROPOFOL;  Surgeon: Jonathon Bellows, MD;  Location: [facility] ENDOSCOPY;  Service: Gastroenterology;  Laterality: N/A;    Prior to Admission medications   Medication Sig Start Date End Date Taking? Authorizing Provider  aspirin EC 81 MG tablet Take 81 mg by mouth daily at 6 (six) AM. 09/19/19   [provider]  cetirizine (ZYRTEC) 10 MG tablet Take 1 tablet (10 mg total) by mouth  daily. 06/11/17   [name], NP  clopidogrel (PLAVIX) 75 MG tablet Take 1 tablet (75 mg total) by mouth daily. 06/12/19   [name], NP  famotidine (PEPCID) 20 MG tablet One after supper Patient taking differently: Take 20 mg by mouth daily. One after supper 10/19/19   Tanda Rockers, MD  fluticasone [facility]) 50 MCG/ACT nasal spray Place 1-2 sprays into both nostrils daily for 7 days. 09/23/19 10/19/19  Wieters, Hallie C, PA-C  folic acid (FOLVITE) 1 MG tablet Take 1 mg by mouth daily.    [provider]  memantine (NAMENDA) 10 MG tablet Take 1 tablet (10 mg total) by mouth 2 (two) times daily. 09/15/19   Garvin Fila, MD  pantoprazole (PROTONIX) 40 MG tablet Take 1 tablet (40 mg total) by mouth daily. Take 30-60 min before first meal of the day 10/19/19   Tanda Rockers, MD  Polyethyl Glycol-Propyl Glycol (SYSTANE) 0.4-0.3 % SOLN Apply 1 drop to eye 2 (two) times daily. 06/12/16   [name], NP  rosuvastatin (CRESTOR) 20 MG tablet Take 1 tablet (20 mg total) by mouth daily. 06/12/19   [name], NP    Allergies Patient has no known allergies.  Family History  Problem Relation Age of Onset  . AAA (abdominal aortic aneurysm) Neg Hx     Social History Social History   Tobacco Use  . Smoking status: Never Smoker  . Smokeless tobacco: Never Used  Vaping Use  . Vaping Use: Never used  Substance Use Topics  . Alcohol  use: No  . Drug use: No    Review of Systems  Constitutional: No fever/chills Eyes: No visual changes. ENT: No sore throat. Cardiovascular: Denies chest pain. Respiratory: Denies shortness of breath. Gastrointestinal: No abdominal pain.  No nausea, no vomiting.  No diarrhea.  No constipation. Genitourinary: Negative for dysuria. Musculoskeletal: Negative for back pain. Skin: Negative for rash. Neurological: Negative for focal weakness or numbness.  Positive for disorientation and  confusion.   ____________________________________________   PHYSICAL EXAM:  VITAL SIGNS: ED Triage Vitals  Enc Vitals Group     BP 10/28/19 0853 112/64     Pulse Rate 10/28/19 0853 78     Resp 10/28/19 0853 16     Temp 10/28/19 0853 98.9 F (37.2 C)     Temp Source 10/28/19 0853 Oral     SpO2 10/28/19 0853 98 %     Weight 10/28/19 0853 154 lb (69.9 kg)     Height 10/28/19 0853 5\' 10"  (1.778 m)     Head Circumference --      Peak Flow --      Pain Score 10/28/19 0859 0     Pain Loc --      Pain Edu? --      Excl. in [place]? --      Constitutional: Alert and oriented. Well appearing and in no acute distress.  Pleasant and conversational via interpreter.  Oriented to person and occasionally situation: Upon my initial evaluation he could tell me where he was, but as I was leaving the room the daughter pulled me back in and says patient wanted to talk to me.  Indicating that he was curious if he was in the [place] or the [employer] health department. Eyes: Conjunctivae are normal. PERRL. EOMI. Head: Atraumatic. Nose: No congestion/rhinnorhea. Mouth/Throat: Mucous membranes are moist.  Oropharynx non-erythematous. Neck: No stridor. No cervical spine tenderness to palpation. Cardiovascular: Normal rate, regular rhythm. Grossly normal heart sounds.  Good peripheral circulation. Respiratory: Normal respiratory effort.  No retractions. Lungs CTAB. Gastrointestinal: Soft , nondistended, nontender to palpation. No abdominal bruits. No CVA tenderness. Musculoskeletal: No lower extremity tenderness nor edema.  No joint effusions. No signs of acute trauma. Neurologic:  Normal speech and language. No gross focal neurologic deficits are appreciated. No gait instability noted. Cranial nerves II through XII intact. Skin:  Skin is warm, dry and intact. No rash noted. Psychiatric: Mood and affect are normal. Speech and behavior are  normal.  ____________________________________________   LABS (all labs ordered are listed, but only abnormal results are displayed)  Labs Reviewed  COMPREHENSIVE METABOLIC PANEL - Abnormal; Notable for the following components:      Result Value   Sodium 134 (*)    All other components within normal limits  CBC - Abnormal; Notable for the following components:   Hemoglobin 12.9 (*)    All other components within normal limits  SARS CORONAVIRUS 2 BY RT PCR (HOSPITAL ORDER, [facility])  URINALYSIS, COMPLETE (UACMP) WITH MICROSCOPIC  URINALYSIS, ROUTINE W REFLEX MICROSCOPIC  CBG MONITORING, ED   ____________________________________________  12 Lead EKG  Sinus rhythm, rate of 76 bpm, normal axis and intervals.  No evidence of acute ischemia. ____________________________________________  RADIOLOGY  ED MD interpretation: CT head without evidence of acute intracranial pathology or ICH.  Obtained a concern for acute CVA in the setting of Plavix  Official radiology report(s): CT Head Wo Contrast  Result Date: 10/28/2019 CLINICAL DATA:  Transient ischemic attack;  history of CVA, TIA on Plavix, new disorientation and confusion, evaluate for ICH or signs of new CVA. EXAM: CT HEAD WITHOUT CONTRAST TECHNIQUE: Contiguous axial images were obtained from the base of the skull through the vertex without intravenous contrast. COMPARISON:  MRI/MRA head 03/31/2018, CT angiogram head/neck 04/05/2016 FINDINGS: Brain: Stable, moderate generalized parenchymal atrophy. Redemonstrated chronic left PCA territory infarct within the left occipital lobe and posterior left thalamus Unchanged advanced confluent hypoattenuation within the cerebral white matter which is nonspecific, but most commonly seen on the basis of chronic small vessel ischemia. There is no acute intracranial hemorrhage. No acute demarcated cortical infarct is identified. No extra-axial fluid collection. No evidence  of intracranial mass. No midline shift. Vascular: No hyperdense vessel.  Vertebrobasilar dolichoectasia. Skull: Normal. Negative for fracture or focal lesion. Sinuses/Orbits: Visualized orbits show no acute finding. Moderate ethmoid sinus mucosal thickening, as well as frothy secretions within posterior left ethmoid air cells. No significant mastoid effusion. IMPRESSION: No CT evidence of acute intracranial abnormality. Redemonstrated chronic left PCA territory infarct. Stable, advanced chronic small vessel ischemic changes within the cerebral white matter. Stable, moderate generalized parenchymal atrophy. Vertebrobasilar dolichoectasia. Ethmoid sinusitis. Electronically Signed   By: Kellie Simmering DO   On: 10/28/2019 13:41    ____________________________________________   PROCEDURES  Procedure(s) performed (including Critical Care):  Procedures   ____________________________________________   INITIAL IMPRESSION / ASSESSMENT AND PLAN / ED COURSE  74 year old man with history of CVA on Plavix, presenting with confusion and disorientation concerning for possible additional CVA requiring medical admission.  Normal vital signs.  Exam with confused patient who is uncertain if he is in [place] or the [place], but without focal deficits otherwise or in the extremities.  Unremarkable blood work.  CT head without ICH considering his use of antiplatelet agents.  Will admit the patient to medicine for further work-up and management and obtaining an MRI for further stroke work-up.   ____________________________________________   FINAL CLINICAL IMPRESSION(S) / ED DIAGNOSES  Final diagnoses:  Confusion  Cerebrovascular accident (CVA), unspecified mechanism [facility])     ED Discharge Orders    None       Blimie Vaness   Note:  This document was prepared using Systems analyst and may include unintentional dictation errors.   Vladimir Crofts, MD 10/28/19 [phone]

## 2019-10-28 NOTE — ED Notes (Signed)
Pt able to use toilet with this Rn assistance for safety. Pt placed back on stretcher safely.

## 2019-10-28 NOTE — ED Notes (Signed)
Interpreter stratus at bedside.

## 2019-10-28 NOTE — ED Notes (Addendum)
Family st hx of TIA with right sided deficit. st decreased PO/fluids intake since Sunday evening 7/18. St seen at [facility] on 6/30 CC dysuria and sent home with anbx Cefdinir. Pt finished his anbx.

## 2019-10-28 NOTE — H&P (Addendum)
History and Physical    Christopher Murphy OAC:[id number] DOB: Aug 16, 1945 DOA: 10/28/2019  PCP: [name], NP   Patient coming from: Home  I have personally briefly reviewed patient's old medical records in [place]  Chief Complaint: Confusion  HPI: Christopher Murphy is a 74 y.o. male with medical history significant for CVA who was brought into the emergency room by his daughter in law for evaluation of confusion which he has had for about 3 days. Family states that it is not his baseline. His daughter in law states that he has had hallucinations, seeing people that are not there and thinking he is back in [place]. He has no focal deficits, no visual impairment, no difficulty swallowing. His daughter in law also states that he has had urinary incontinence as well as frequency of urination. Patient was recently treated for a UTI and completed a course of Cefdinir. He denies having any fever, no cough, no dizziness or lightheadedness, no abdominal pain or changes in his bowel habits. Patient has a CT scan of the head which showed Stable, moderate generalized parenchymal atrophy. Redemonstrated chronic left PCA territory infarct within the left occipital lobe and posterior left thalamus. Unchanged advanced confluent hypoattenuation within the cerebral white matter which is nonspecific, but most commonly seen on the basis of chronic small vessel ischemia. There is no acute intracranial hemorrhage. 12 Lead EKG reviewed showed normal sinus rhythm Labs are within normal limits   ED Course: Patient with a history of a prior stroke who was brought in by family members for evaluation of confusion and concerns for a TIA or recurrent stroke.  Patient was recently treated for UTI.  He will be referred to observation status for further evaluation.  Review of Systems: As per HPI otherwise 10 point review of systems negative.    Past Medical History:  Diagnosis Date  . Blood clots in  brain 2009  . Stroke [facility])     Past Surgical History:  Procedure Laterality Date  . COLONOSCOPY WITH PROPOFOL N/A 04/30/2019   Procedure: COLONOSCOPY WITH PROPOFOL;  Surgeon: Jonathon Bellows, MD;  Location: [facility] ENDOSCOPY;  Service: Gastroenterology;  Laterality: N/A;     reports that he has never smoked. He has never used smokeless tobacco. He reports that he does not drink alcohol and does not use drugs.  No Known Allergies  Family History  Problem Relation Age of Onset  . AAA (abdominal aortic aneurysm) Neg Hx      Prior to Admission medications   Medication Sig Start Date End Date Taking? Authorizing Provider  aspirin EC 81 MG tablet Take 81 mg by mouth daily at 6 (six) AM. 09/19/19   [provider]  cetirizine (ZYRTEC) 10 MG tablet Take 1 tablet (10 mg total) by mouth daily. 06/11/17   [name], NP  clopidogrel (PLAVIX) 75 MG tablet Take 1 tablet (75 mg total) by mouth daily. 06/12/19   [name], NP  famotidine (PEPCID) 20 MG tablet One after supper Patient taking differently: Take 20 mg by mouth daily. One after supper 10/19/19   Tanda Rockers, MD  fluticasone [facility]) 50 MCG/ACT nasal spray Place 1-2 sprays into both nostrils daily for 7 days. 09/23/19 10/19/19  Wieters, Hallie C, PA-C  folic acid (FOLVITE) 1 MG tablet Take 1 mg by mouth daily.    [provider]  memantine (NAMENDA) 10 MG tablet Take 1 tablet (10 mg total) by mouth 2 (two) times daily. 09/15/19  Garvin Fila, MD  pantoprazole (PROTONIX) 40 MG tablet Take 1 tablet (40 mg total) by mouth daily. Take 30-60 min before first meal of the day 10/19/19   Tanda Rockers, MD  Polyethyl Glycol-Propyl Glycol (SYSTANE) 0.4-0.3 % SOLN Apply 1 drop to eye 2 (two) times daily. 06/12/16   [name], NP  rosuvastatin (CRESTOR) 20 MG tablet Take 1 tablet (20 mg total) by mouth daily. 06/12/19   [name], NP    Physical Exam: Vitals:   10/28/19 0853  BP: 112/64  Pulse: 78  Resp: 16   Temp: 98.9 F (37.2 C)  TempSrc: Oral  SpO2: 98%  Weight: 69.9 kg  Height: 5\' 10"  (1.778 m)     Vitals:   10/28/19 0853  BP: 112/64  Pulse: 78  Resp: 16  Temp: 98.9 F (37.2 C)  TempSrc: Oral  SpO2: 98%  Weight: 69.9 kg  Height: 5\' 10"  (1.778 m)    Constitutional: NAD, alert and oriented x 2 Eyes: PERRL, lids and conjunctivae normal ENMT: Mucous membranes are moist.  Neck: normal, supple, no masses, no thyromegaly Respiratory: clear to auscultation bilaterally, no wheezing, no crackles. Normal respiratory effort. No accessory muscle use.  Cardiovascular: Regular rate and rhythm, no murmurs / rubs / gallops. No extremity edema. 2+ pedal pulses. No carotid bruits.  Abdomen: no tenderness, no masses palpated. No hepatosplenomegaly. Bowel sounds positive.  Musculoskeletal: no clubbing / cyanosis. No joint deformity upper and lower extremities.  Skin: no rashes, lesions, ulcers.  Neurologic: No gross focal neurologic deficit. Able to move all extremity Psychiatric: Normal mood and affect.   Labs on Admission: I have personally reviewed following labs and imaging studies  CBC: Recent Labs  Lab 10/28/19 0905  WBC 6.4  HGB 12.9*  HCT 39.2  MCV 82.4  PLT 211   Basic Metabolic Panel: Recent Labs  Lab 10/28/19 0905  NA 134*  K 4.2  CL 102  CO2 25  GLUCOSE 91  BUN 13  CREATININE 0.88  CALCIUM 9.0   GFR: Estimated Creatinine Clearance: 73.9 mL/min (by C-G formula based on SCr of 0.88 mg/dL). Liver Function Tests: Recent Labs  Lab 10/28/19 0905  AST 15  ALT 10  ALKPHOS 50  BILITOT 0.9  PROT 7.4  ALBUMIN 3.9   No results for input(s): LIPASE, AMYLASE in the last 168 hours. No results for input(s): AMMONIA in the last 168 hours. Coagulation Profile: No results for input(s): INR, PROTIME in the last 168 hours. Cardiac Enzymes: No results for input(s): CKTOTAL, CKMB, CKMBINDEX, TROPONINI in the last 168 hours. BNP (last 3 results) No results for  input(s): PROBNP in the last 8760 hours. HbA1C: No results for input(s): HGBA1C in the last 72 hours. CBG: No results for input(s): GLUCAP in the last 168 hours. Lipid Profile: No results for input(s): CHOL, HDL, LDLCALC, TRIG, CHOLHDL, LDLDIRECT in the last 72 hours. Thyroid Function Tests: No results for input(s): TSH, T4TOTAL, FREET4, T3FREE, THYROIDAB in the last 72 hours. Anemia Panel: No results for input(s): VITAMINB12, FOLATE, FERRITIN, TIBC, IRON, RETICCTPCT in the last 72 hours. Urine analysis: No results found for: COLORURINE, APPEARANCEUR, LABSPEC, PHURINE, GLUCOSEU, HGBUR, BILIRUBINUR, KETONESUR, PROTEINUR, UROBILINOGEN, NITRITE, LEUKOCYTESUR  Radiological Exams on Admission: CT Head Wo Contrast  Result Date: 10/28/2019 CLINICAL DATA:  Transient ischemic attack; history of CVA, TIA on Plavix, new disorientation and confusion, evaluate for ICH or signs of new CVA. EXAM: CT HEAD WITHOUT CONTRAST TECHNIQUE: Contiguous axial images were obtained from the  base of the skull through the vertex without intravenous contrast. COMPARISON:  MRI/MRA head 03/31/2018, CT angiogram head/neck 04/05/2016 FINDINGS: Brain: Stable, moderate generalized parenchymal atrophy. Redemonstrated chronic left PCA territory infarct within the left occipital lobe and posterior left thalamus Unchanged advanced confluent hypoattenuation within the cerebral white matter which is nonspecific, but most commonly seen on the basis of chronic small vessel ischemia. There is no acute intracranial hemorrhage. No acute demarcated cortical infarct is identified. No extra-axial fluid collection. No evidence of intracranial mass. No midline shift. Vascular: No hyperdense vessel.  Vertebrobasilar dolichoectasia. Skull: Normal. Negative for fracture or focal lesion. Sinuses/Orbits: Visualized orbits show no acute finding. Moderate ethmoid sinus mucosal thickening, as well as frothy secretions within posterior left ethmoid air cells.  No significant mastoid effusion. IMPRESSION: No CT evidence of acute intracranial abnormality. Redemonstrated chronic left PCA territory infarct. Stable, advanced chronic small vessel ischemic changes within the cerebral white matter. Stable, moderate generalized parenchymal atrophy. Vertebrobasilar dolichoectasia. Ethmoid sinusitis. Electronically Signed   By: Kellie Simmering DO   On: 10/28/2019 13:41    EKG: Independently reviewed.  Normal sinus rhythm  Assessment/Plan Active Problems:   TIA (transient ischemic attack)   Vascular dementia ([place])    TIA Patient with a history of prior stroke who presents for evaluation of mental status changes concerning for another stroke Will obtain MRI of the brain to rule out an acute stroke Obtain 2D Echocardiogram to assess LVEF and rule out cardiac thrombus Obtain Carotid Doppler Continue Aspirin, Plavix and statins Follow up results of UA, to rule out an acute UTI   Vascular Dementia Continue Namenda   DVT prophylaxis: SCD Code Status: Full Family Communication: Greater than 50% of time was spent discussing plan of care with patient's daughter in law at the bedside. She verbalizes understanding and agrees with the plan. Disposition Plan: Back to previous home environment Consults called: None    Jaevion Goto MD [facility]     10/28/2019, 4:17 PM

## 2019-10-28 NOTE — ED Triage Notes (Signed)
Per pt daughter n law, pt has been confused with place and time since Sunday. Not his baseline. States he has a hx of stroke/tia. Pt Only able to understand a little english.

## 2019-10-28 NOTE — ED Notes (Signed)
Echocardiogram at bedside.

## 2019-10-28 NOTE — Progress Notes (Signed)
Speaks hindi-punjabi

## 2019-10-28 NOTE — ED Notes (Signed)
Awaiting on transport.

## 2019-10-28 NOTE — ED Notes (Signed)
This Rn attempted to call report to assigned room 119. Room has not been assigned at this time. This RN advised to call back.

## 2019-10-29 ENCOUNTER — Observation Stay: Payer: 59

## 2019-10-29 DIAGNOSIS — G459 Transient cerebral ischemic attack, unspecified: Secondary | ICD-10-CM | POA: Diagnosis not present

## 2019-10-29 DIAGNOSIS — F0150 Vascular dementia without behavioral disturbance: Secondary | ICD-10-CM | POA: Diagnosis not present

## 2019-10-29 DIAGNOSIS — I6529 Occlusion and stenosis of unspecified carotid artery: Secondary | ICD-10-CM | POA: Diagnosis not present

## 2019-10-29 DIAGNOSIS — R410 Disorientation, unspecified: Secondary | ICD-10-CM | POA: Diagnosis not present

## 2019-10-29 LAB — LIPID PANEL
Cholesterol: 129 mg/dL (ref 0–200)
HDL: 45 mg/dL (ref >40)
LDL Cholesterol: 66 mg/dL (ref 0–99)
Total CHOL/HDL Ratio: 2.9 RATIO
Triglycerides: 89 mg/dL (ref <150)
VLDL: 18 mg/dL (ref 0–40)

## 2019-10-29 LAB — ECHOCARDIOGRAM COMPLETE
Area-P 1/2: 2.62 cm2
Height: 70 in
P 1/2 time: 494 msec
S' Lateral: 2.25 cm
Weight: 2464 oz

## 2019-10-29 LAB — HEMOGLOBIN A1C
Hgb A1c MFr Bld: 5.3 % (ref 4.8–5.6)
Mean Plasma Glucose: 105.41 mg/dL

## 2019-10-29 MED ORDER — IOHEXOL 300 MG/ML  SOLN
75.0000 mL | Freq: Once | INTRAMUSCULAR | Status: AC | PRN
  Administered 2019-10-29: 10:00:00 75 mL via INTRAVENOUS

## 2019-10-29 NOTE — Progress Notes (Signed)
Daughter at Bedside Discharge instructions given to daughter No other issues or concerns

## 2019-10-29 NOTE — Plan of Care (Signed)
  Problem: Education: Goal: Knowledge of General Education information will improve Description: Including pain rating scale, medication(s)/side effects and non-pharmacologic comfort measures 10/29/2019 1458 by Lorn Junes, RN Outcome: Adequate for Discharge 10/29/2019 0746 by Lorn Junes, RN Outcome: Progressing   Problem: Education: Goal: Knowledge of disease or condition will improve 10/29/2019 1458 by Lorn Junes, RN Outcome: Adequate for Discharge 10/29/2019 0746 by Lorn Junes, RN Outcome: Progressing Goal: Knowledge of secondary prevention will improve 10/29/2019 1458 by Lorn Junes, RN Outcome: Adequate for Discharge 10/29/2019 0746 by Lorn Junes, RN Outcome: Progressing Goal: Knowledge of patient specific risk factors addressed and post discharge goals established will improve 10/29/2019 1458 by Lorn Junes, RN Outcome: Adequate for Discharge 10/29/2019 0746 by Lorn Junes, RN Outcome: Progressing   Problem: Coping: Goal: Will identify appropriate support needs 10/29/2019 1458 by Lorn Junes, RN Outcome: Adequate for Discharge 10/29/2019 0746 by Lorn Junes, RN Outcome: Progressing

## 2019-10-29 NOTE — Discharge Summary (Signed)
Physician Discharge Summary  Christopher Murphy PJA:[id number] DOB: 02-07-1946 DOA: 10/28/2019  PCP: [name], NP  Admit date: 10/28/2019 Discharge date: 10/29/2019  Admitted From: Home Disposition:  Home  Recommendations for Outpatient Follow-up:  1. Follow up with PCP in 1-2 weeks 2. Please obtain BMP/CBC in one week 3. Please follow up on the following pending results: Echo results  Home Health: Yes Equipment/Devices: 3 and 1 Discharge Condition: Stable  CODE STATUS: Full Diet recommendation: Heart Healthy   Brief/Interim Summary: Christopher Murphy is a 74 y.o. male with medical history significant for CVA who was brought into the emergency room by his daughter in law for evaluation of confusion which he has had for about 3 days. Family states that it is not his baseline. His daughter in law states that he has had hallucinations, seeing people that are not there and thinking he is back in [place]. He has no focal deficits, no visual impairment, no difficulty swallowing. His daughter in law also states that he has had urinary incontinence as well as frequency of urination. Patient was recently treated for a UTI and completed a course of Cefdinir.  UA did show mild microscopic hematuria.  History of recently treated UTI, might be residual, PCP can follow-up on it and if it persist he will need a outpatient urological evaluation.  Patient underwent stroke work-up for a concern of recurrent stroke/TIA.  Images and work-up was negative for any acute abnormality.  Patient was also complaining of chronic cough which is being worked up by a pulmonologist.  Had negative chest x-ray and per patient no response to a trial of PPI.  We did a CT chest and it was without any significant abnormality, see the results below.  Patient will continue with his home meds and follow-up with his providers.  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 All of your test came back within normal limit.  Your CT chest is without any significant abnormality.  Please follow-up with your neurologist and pulmonologist according to your appointments. We did order some home health physical therapy and occupational therapy services.   Increase activity slowly   Complete by: As directed      Allergies as of 10/29/2019   No Known Allergies     Medication List    TAKE these medications   aspirin EC 81 MG tablet Take 81 mg by mouth daily at 6 (six) AM.   cetirizine 10 MG tablet Commonly known as: ZYRTEC Take 1 tablet (10 mg total) by mouth daily.   clopidogrel 75 MG tablet Commonly known as: PLAVIX Take 1 tablet (75 mg total) by mouth daily.   famotidine 20 MG tablet Commonly known as: Pepcid One after supper What changed:   how much to take  how to take this  when to take this   fluticasone 50 MCG/ACT nasal spray Commonly known as: FLONASE Place 1-2 sprays into both nostrils daily for 7 days.   folic acid 1 MG tablet Commonly known as: FOLVITE Take 1 mg by mouth daily.   memantine 10 MG tablet Commonly known as: NAMENDA Take 1 tablet (10 mg total)  by mouth 2 (two) times daily.   pantoprazole 40 MG tablet Commonly known as: Protonix Take 1 tablet (40 mg total) by mouth daily. Take 30-60 min before first meal of the day   Polyethyl Glycol-Propyl Glycol 0.4-0.3 % Soln Commonly known as: Systane Apply 1 drop to eye 2 (two) times daily.   rosuvastatin 20 MG tablet Commonly known as: CRESTOR Take 1 tablet (20 mg total) by mouth daily.       Follow-up Information    [name], NP. Schedule an appointment as soon as possible for a visit.   Specialties:  Internal Medicine, Emergency Medicine Contact information: [place] [address] [phone]              No Known Allergies  Consultations:  None  Procedures/Studies: CT Head Wo Contrast  Result Date: 10/28/2019 CLINICAL DATA:  Transient ischemic attack; history of CVA, TIA on Plavix, new disorientation and confusion, evaluate for ICH or signs of new CVA. EXAM: CT HEAD WITHOUT CONTRAST TECHNIQUE: Contiguous axial images were obtained from the base of the skull through the vertex without intravenous contrast. COMPARISON:  MRI/MRA head 03/31/2018, CT angiogram head/neck 04/05/2016 FINDINGS: Brain: Stable, moderate generalized parenchymal atrophy. Redemonstrated chronic left PCA territory infarct within the left occipital lobe and posterior left thalamus Unchanged advanced confluent hypoattenuation within the cerebral white matter which is nonspecific, but most commonly seen on the basis of chronic small vessel ischemia. There is no acute intracranial hemorrhage. No acute demarcated cortical infarct is identified. No extra-axial fluid collection. No evidence of intracranial mass. No midline shift. Vascular: No hyperdense vessel.  Vertebrobasilar dolichoectasia. Skull: Normal. Negative for fracture or focal lesion. Sinuses/Orbits: Visualized orbits show no acute finding. Moderate ethmoid sinus mucosal thickening, as well as frothy secretions within posterior left ethmoid air cells. No significant mastoid effusion. IMPRESSION: No CT evidence of acute intracranial abnormality. Redemonstrated chronic left PCA territory infarct. Stable, advanced chronic small vessel ischemic changes within the cerebral white matter. Stable, moderate generalized parenchymal atrophy. Vertebrobasilar dolichoectasia. Ethmoid sinusitis. Electronically Signed   By: Kellie Simmering DO   On: 10/28/2019 13:41   CT CHEST W CONTRAST  Result Date: 10/29/2019 CLINICAL DATA:  Cough for 4 months, labored  breathing EXAM: CT CHEST WITH CONTRAST TECHNIQUE: Multidetector CT imaging of the chest was performed during intravenous contrast administration. Sagittal and coronal MPR images reconstructed from axial data set. CONTRAST:  96mL OMNIPAQUE IOHEXOL 300 MG/ML  SOLN IV COMPARISON:  None FINDINGS: Cardiovascular: Upper normal caliber of ascending thoracic aorta. Minimal atherosclerotic calcifications aorta and more prominently in coronary arteries. Heart unremarkable. Minimal pericardial fluid. Mediastinum/Nodes: Calcified lymph node adjacent to LEFT lower lobe bronchus. No thoracic adenopathy. Base of cervical region normal appearance. Lungs/Pleura: Minimal RIGHT upper lobe scarring near apex. Mild central peribronchial thickening. Lungs otherwise clear. No pulmonary infiltrate, pleural effusion or pneumothorax. Upper Abdomen: Small cyst at upper pole RIGHT kidney. Remaining visualized upper abdomen unremarkable Musculoskeletal: No acute osseous findings. IMPRESSION: Mild bronchitic changes. No additional acute intrathoracic abnormalities. Coronary arterial calcifications. Aortic Atherosclerosis (ICD10-I70.0). Electronically Signed   By: Lavonia Dana M.D.   On: 10/29/2019 10:51   MR BRAIN WO CONTRAST  Result Date: 10/28/2019 CLINICAL DATA:  Follow-up examination for acute stroke. EXAM: MRI HEAD WITHOUT CONTRAST TECHNIQUE: Multiplanar, multiecho pulse sequences of the brain and surrounding structures were obtained without intravenous contrast. COMPARISON:  Prior head CT from earlier the same day as well as previous MRI from 03/31/2018. FINDINGS: Brain: Markedly advanced  cerebral atrophy is seen. Extensive confluent T2/FLAIR hyperintensity seen throughout the supratentorial cerebral white matter, most likely related to advanced chronic microvascular ischemic disease. Overall, appearance is mildly progressed from previous. Superimposed chronic left PCA territory infarct involving the left occipital lobe and left  thalamus again noted, stable. Subcortical diffusion signal abnormality seen involving the right greater than left frontal lobes, with additional scattered patchy involvement of the right parietal region, splenium, and posterior left frontal region. No associated ADC correlate. Appearance is relatively stable from prior exams, and felt to be most consistent with T2 shine through. No frank restricted diffusion to suggest acute or subacute ischemia. Gray-white matter differentiation maintained. No evidence for acute or chronic intracranial hemorrhage. No mass lesion, midline shift or mass effect. Diffuse ventricular prominence related to global parenchymal volume loss without hydrocephalus. No extra-axial fluid collection. Pituitary gland suprasellar region within normal limits. Midline structures intact. Vascular: Major intracranial vascular flow voids are maintained. Intracranial arterial circulation demonstrates a dolichoectatic appearance. Skull and upper cervical spine: Craniocervical junction within normal limits. Bone marrow signal intensity heterogeneous but within normal limits. No focal marrow replacing lesion. No scalp soft tissue abnormality. Sinuses/Orbits: Patient status post bilateral ocular lens replacement. Scattered mucosal thickening noted within the ethmoidal air cells. Paranasal sinuses are otherwise clear. Trace right mastoid effusion noted, of doubtful significance. Inner ear structures grossly normal. Other: None. IMPRESSION: 1. No acute intracranial abnormality. 2. Markedly advanced cerebral atrophy with chronic microvascular ischemic disease, mildly progressed as compared to 2019. Scattered diffusion signal abnormality involving the subcortical aspects of both cerebral hemispheres is relatively stable from previous, and felt to be most consistent with T2 shine through. 3. Underlying chronic left PCA territory infarct, stable. Electronically Signed   By: Jeannine Boga M.D.   On:  10/28/2019 22:17   US Carotid Bilateral (at [facility] and [facility] only)  Result Date: 10/28/2019 CLINICAL DATA:  Confusion.  History of stroke. EXAM: BILATERAL CAROTID DUPLEX ULTRASOUND TECHNIQUE: Pearline Cables scale imaging, color Doppler and duplex ultrasound were performed of bilateral carotid and vertebral arteries in the neck. COMPARISON:  None. FINDINGS: Criteria: Quantification of carotid stenosis is based on velocity parameters that correlate the residual internal carotid diameter with NASCET-based stenosis levels, using the diameter of the distal internal carotid lumen as the denominator for stenosis measurement. The following velocity measurements were obtained: RIGHT ICA: 101/24 cm/sec CCA: 16/1 cm/sec SYSTOLIC ICA/CCA RATIO:  1.5 ECA: 71 cm/sec LEFT ICA: 76/22 cm/sec CCA: 09/60 cm/sec SYSTOLIC ICA/CCA RATIO:  1.1 ECA: 62 cm/sec RIGHT CAROTID ARTERY: Right carotid arteries are patent without significant plaque or stenosis. Normal waveforms and velocities in the internal carotid artery. External carotid artery is patent with normal waveform. RIGHT VERTEBRAL ARTERY: Antegrade flow and normal waveform in the right vertebral artery. LEFT CAROTID ARTERY: Left carotid arteries are patent without significant plaque or stenosis. Normal waveforms and velocities in the internal carotid artery. External carotid artery is patent with normal waveform. LEFT VERTEBRAL ARTERY: Antegrade flow and normal waveform in the left vertebral artery. IMPRESSION: Normal carotid artery duplex examination. Carotid arteries are patent without significant plaque or stenosis. Electronically Signed   By: Markus Daft M.D.   On: 10/28/2019 17:19     Subjective: Patient is complaining of chronic cough, no recent change.  Daughter was in the room.  Discharge Exam: Vitals:   10/29/19 0854 10/29/19 1313  BP: 107/76 115/69  Pulse: 73 78  Resp: 16 16  Temp: 97.6 F (36.4 C) 98 F (36.7 C)  SpO2: 100%  100%   Vitals:   10/29/19 0406 10/29/19  0457 10/29/19 0854 10/29/19 1313  BP: 131/77 123/77 107/76 115/69  Pulse: 71 66 73 78  Resp: 14  16 16   Temp: 97.7 F (36.5 C)  97.6 F (36.4 C) 98 F (36.7 C)  TempSrc: Oral  Oral Oral  SpO2: 98% 100% 100% 100%  Weight:      Height:        General: Pt is alert, awake, not in acute distress Cardiovascular: RRR, S1/S2 +, no rubs, no gallops Respiratory: CTA bilaterally, no wheezing, no rhonchi Abdominal: Soft, NT, ND, bowel sounds + Extremities: no edema, no cyanosis   The results of significant diagnostics from this hospitalization (including imaging, microbiology, ancillary and laboratory) are listed below for reference.    Microbiology: Recent Results (from the past 240 hour(s))  SARS Coronavirus 2 by RT PCR (hospital order, performed in [facility] hospital lab) Nasopharyngeal Nasopharyngeal Swab     Status: None   Collection Time: 10/28/19  1:42 PM   Specimen: Nasopharyngeal Swab  Result Value Ref Range Status   SARS Coronavirus 2 NEGATIVE NEGATIVE Final    Comment: (NOTE) SARS-CoV-2 target nucleic acids are NOT DETECTED.  The SARS-CoV-2 RNA is generally detectable in upper and lower respiratory specimens during the acute phase of infection. The lowest concentration of SARS-CoV-2 viral copies this assay can detect is 250 copies / mL. A negative result does not preclude SARS-CoV-2 infection and should not be used as the sole basis for treatment or other patient management decisions.  A negative result may occur with improper specimen collection / handling, submission of specimen other than nasopharyngeal swab, presence of viral mutation(s) within the areas targeted by this assay, and inadequate number of viral copies (<250 copies / mL). A negative result must be combined with clinical observations, patient history, and epidemiological information.  Fact Sheet for Patients:   StrictlyIdeas.no  Fact Sheet for Healthcare  Providers: BankingDealers.co.za  This test is not yet approved or  cleared by the [place] FDA and has been authorized for detection and/or diagnosis of SARS-CoV-2 by FDA under an Emergency Use Authorization (EUA).  This EUA will remain in effect (meaning this test can be used) for the duration of the COVID-19 declaration under Section 564(b)(1) of the Act, 21 U.S.C. section 360bbb-3(b)(1), unless the authorization is terminated or revoked sooner.  Performed at [facility], [address] [place], [place] [address]      Labs: BNP (last 3 results) No results for input(s): BNP in the last 8760 hours. Basic Metabolic Panel: Recent Labs  Lab 10/28/19 0905  NA 134*  K 4.2  CL 102  CO2 25  GLUCOSE 91  BUN 13  CREATININE 0.88  CALCIUM 9.0   Liver Function Tests: Recent Labs  Lab 10/28/19 0905  AST 15  ALT 10  ALKPHOS 50  BILITOT 0.9  PROT 7.4  ALBUMIN 3.9   No results for input(s): LIPASE, AMYLASE in the last 168 hours. No results for input(s): AMMONIA in the last 168 hours. CBC: Recent Labs  Lab 10/28/19 0905  WBC 6.4  HGB 12.9*  HCT 39.2  MCV 82.4  PLT 229   Cardiac Enzymes: No results for input(s): CKTOTAL, CKMB, CKMBINDEX, TROPONINI in the last 168 hours. BNP: Invalid input(s): POCBNP CBG: No results for input(s): GLUCAP in the last 168 hours. D-Dimer No results for input(s): DDIMER in the last 72 hours. Hgb A1c Recent Labs    10/29/19 0444  HGBA1C  5.3   Lipid Profile Recent Labs    10/29/19 0444  CHOL 129  HDL 45  LDLCALC 66  TRIG 89  CHOLHDL 2.9   Thyroid function studies No results for input(s): TSH, T4TOTAL, T3FREE, THYROIDAB in the last 72 hours.  Invalid input(s): FREET3 Anemia work up No results for input(s): VITAMINB12, FOLATE, FERRITIN, TIBC, IRON, RETICCTPCT in the last 72 hours. Urinalysis    Component Value Date/Time   COLORURINE YELLOW (A) 10/28/2019 1222   APPEARANCEUR CLEAR (A)  10/28/2019 1222   LABSPEC 1.014 10/28/2019 1222   PHURINE 6.0 10/28/2019 1222   GLUCOSEU NEGATIVE 10/28/2019 1222   HGBUR MODERATE (A) 10/28/2019 1222   BILIRUBINUR NEGATIVE 10/28/2019 1222   KETONESUR NEGATIVE 10/28/2019 1222   PROTEINUR NEGATIVE 10/28/2019 1222   NITRITE NEGATIVE 10/28/2019 1222   LEUKOCYTESUR NEGATIVE 10/28/2019 1222   Sepsis Labs Invalid input(s): PROCALCITONIN,  WBC,  LACTICIDVEN Microbiology Recent Results (from the past 240 hour(s))  SARS Coronavirus 2 by RT PCR (hospital order, performed in [place] hospital lab) Nasopharyngeal Nasopharyngeal Swab     Status: None   Collection Time: 10/28/19  1:42 PM   Specimen: Nasopharyngeal Swab  Result Value Ref Range Status   SARS Coronavirus 2 NEGATIVE NEGATIVE Final    Comment: (NOTE) SARS-CoV-2 target nucleic acids are NOT DETECTED.  The SARS-CoV-2 RNA is generally detectable in upper and lower respiratory specimens during the acute phase of infection. The lowest concentration of SARS-CoV-2 viral copies this assay can detect is 250 copies / mL. A negative result does not preclude SARS-CoV-2 infection and should not be used as the sole basis for treatment or other patient management decisions.  A negative result may occur with improper specimen collection / handling, submission of specimen other than nasopharyngeal swab, presence of viral mutation(s) within the areas targeted by this assay, and inadequate number of viral copies (<250 copies / mL). A negative result must be combined with clinical observations, patient history, and epidemiological information.  Fact Sheet for Patients:   StrictlyIdeas.no  Fact Sheet for Healthcare Providers: BankingDealers.co.za  This test is not yet approved or  cleared by the [place] FDA and has been authorized for detection and/or diagnosis of SARS-CoV-2 by FDA under an Emergency Use Authorization (EUA).  This EUA will  remain in effect (meaning this test can be used) for the duration of the COVID-19 declaration under Section 564(b)(1) of the Act, 21 U.S.C. section 360bbb-3(b)(1), unless the authorization is terminated or revoked sooner.  Performed at [facility], [address] [place], [place] [address]     Time coordinating discharge: Over 30 minutes  SIGNED:  Lorella Nimrod, MD  [facility] 10/29/2019, 2:49 PM  If 7PM-7AM, please contact night-coverage [email or web address]  This record has been created using Systems analyst. Errors have been sought and corrected,but may not always be located. Such creation errors do not reflect on the standard of care.

## 2019-10-29 NOTE — Evaluation (Signed)
Physical Therapy Evaluation Patient Details Name: Christopher Murphy MRN: [id number] DOB: Jun 28, 1945 Today's Date: 10/29/2019   History of Present Illness  Pt is a 74 year old male with a history of a prior stroke and vascular dementia who was brought in by family members for evaluation of confusion and concerns for a TIA or recurrent stroke.  Patient was recently treated for UTI.  MD assessment included possible TIA with imaging negative.    Clinical Impression  Pt pleasant and put forth good effort during the session.  Pt required no assistance with bed mobility tasks and was steady with good eccentric and concentric control with transfers.  Pt was able to amb without an AD but with slow cadence and with mild drifting left/right that the pt was able to self-correct.  Pt's daughter-in-law stated that the pt ambulates 1 mile/day with family at home and only occasionally uses a [facility] for stability.  Per DIL pt presents with only min deficits in strength and ambulation compared to his baseline.  Pt will benefit from HHPT services upon discharge to safely address deficits listed in patient problem list for decreased caregiver assistance and eventual return to PLOF.        Follow Up Recommendations Home health PT;Supervision/Assistance - 24 hour;Supervision for mobility/OOB    Equipment Recommendations  None recommended by PT    Recommendations for Other Services       Precautions / Restrictions Precautions Precautions: Fall Restrictions Weight Bearing Restrictions: No      Mobility  Bed Mobility Overal bed mobility: Independent                Transfers Overall transfer level: Needs assistance Equipment used: None Transfers: Sit to/from Stand Sit to Stand: Supervision         General transfer comment: Good eccentric and concentric control  Ambulation/Gait Ambulation/Gait assistance: Min guard Gait Distance (Feet): 150 Feet Assistive device: None Gait  Pattern/deviations: Step-through pattern;Decreased step length - right;Decreased step length - left;Drifts right/left Gait velocity: decreased   General Gait Details: Min drifting left/right but no physical assistance needed with pt able to self-correcgt  Stairs            Wheelchair Mobility    Modified Rankin (Stroke Patients Only)       Balance Overall balance assessment: Needs assistance Sitting-balance support: Feet supported;No upper extremity supported Sitting balance-Leahy Scale: Good     Standing balance support: No upper extremity supported;During functional activity Standing balance-Leahy Scale: Fair                               Pertinent Vitals/Pain Pain Assessment: No/denies pain    Home Living Family/patient expects to be discharged to:: Private residence Living Arrangements: Children;Other relatives;Spouse/significant other Available Help at Discharge: Family;Available 24 hours/day Type of Home: House Home Access: Ramped entrance     Home Layout: One level Home Equipment: Cane - single point      Prior Function Level of Independence: Independent with assistive device(s)         Comments: Pt ambulates with family one mile per day without an AD, only occasional use of SPC, one fall in the last 6 months while getting out of a car, Mod Ind with ADLs     Hand Dominance   Dominant Hand: Right    Extremity/Trunk Assessment   Upper Extremity Assessment Upper Extremity Assessment: Generalized weakness;RUE deficits/detail RUE Deficits / Details: Chronic RUE strength  deficits compared to LUE from prior CVA    Lower Extremity Assessment Lower Extremity Assessment: Generalized weakness;RLE deficits/detail RLE Deficits / Details: Chronic RLE strength deficits compared to LLE from prior CVA       Communication   Communication: Interpreter utilized  Cognition Arousal/Alertness: Awake/alert Behavior During Therapy: WFL for tasks  assessed/performed Overall Cognitive Status: Impaired/Different from baseline Area of Impairment: Attention;Memory;Following commands;Safety/judgement;Problem solving                   Current Attention Level: Alternating Memory: Decreased short-term memory Following Commands: Follows one step commands inconsistently Safety/Judgement: Decreased awareness of safety;Decreased awareness of deficits   Problem Solving: Slow processing;Difficulty sequencing;Requires verbal cues;Requires tactile cues General Comments: Pt with difficulty following one step commands, as well as slow processing and impaired problem solving.      General Comments General comments (skin integrity, edema, etc.): HR = 90s and SpO2 high 90s after activity    Exercises Other Exercises Other Exercises: provided education re: OT role and plan of care, fall and safety precautions, self care, functional mobility, toilet transfers Other Exercises: provided supervision assist for bed mobility, min guard and mod verbal cues for safety throughout all functional mobility   Assessment/Plan    PT Assessment Patient needs continued PT services  PT Problem List Decreased strength;Decreased activity tolerance;Decreased balance;Decreased mobility;Decreased knowledge of use of DME       PT Treatment Interventions DME instruction;Gait training;Functional mobility training;Therapeutic activities;Therapeutic exercise;Balance training;Patient/family education    PT Goals (Current goals can be found in the Care Plan section)  Acute Rehab PT Goals Patient Stated Goal: To get stronger and return home PT Goal Formulation: With patient/family Time For Goal Achievement: 11/11/19 Potential to Achieve Goals: Good    Frequency Min 2X/week   Barriers to discharge        Co-evaluation               AM-PAC PT "6 Clicks" Mobility  Outcome Measure Help needed turning from your back to your side while in a flat bed without  using bedrails?: None Help needed moving from lying on your back to sitting on the side of a flat bed without using bedrails?: None Help needed moving to and from a bed to a chair (including a wheelchair)?: A Little Help needed standing up from a chair using your arms (e.g., wheelchair or bedside chair)?: A Little Help needed to walk in hospital room?: A Little Help needed climbing 3-5 steps with a railing? : A Little 6 Click Score: 20    End of Session Equipment Utilized During Treatment: Gait belt Activity Tolerance: Patient tolerated treatment well Patient left: in chair;with chair alarm set;with call bell/phone within reach;with family/visitor present Nurse Communication: Mobility status PT Visit Diagnosis: Unsteadiness on feet (R26.81);Muscle weakness (generalized) (M62.81);Difficulty in walking, not elsewhere classified (R26.2)    Time: 1340-1405 PT Time Calculation (min) (ACUTE ONLY): 25 min   Charges:   PT Evaluation $PT Eval Moderate Complexity: 1 Mod          D. Scott Mordche Hedglin PT, DPT 10/29/19, 2:51 PM

## 2019-10-29 NOTE — Evaluation (Signed)
Occupational Therapy Evaluation Patient Details Name: Christopher Murphy MRN: [id number] DOB: Dec 29, 1945 Today's Date: 10/29/2019    History of Present Illness Christopher Murphy is a 74 year old male with a history of a prior stroke and vascular dementia who was brought in by family members for evaluation of confusion and concerns for a TIA or recurrent stroke.  Patient was recently treated for UTI.  He will be referred to observation status for further evaluation.   Clinical Impression   Christopher Murphy presents to OT with impaired cognition and generalized weakness that limits his ability to safely and independently complete functional tasks.  Prior to admission, pt was mod I in all ADLs, occasionally using cane for household mobility.  Currently, pt presents with impaired cognition from his baseline and unsteady gait.  OTR provided moderate verbal cues throughout evaluation for safety and sequencing.  OTR provided moderate assist for lower body dressing 2/2 pt inability to reach feet.  OTR provided supervision assist for bed mobility and min physical assist for all functional transfers and ambulation.  Pt able to complete perihygiene and manipulate clothing as OTR provided min guard assist for standing balance.  OTR also provided min guard for standing balance as pt washed face and hands while standing at sink.  Christopher Murphy will continue to benefit from skilled OT services in acute setting to address functional strengthening, cognition, and safety and independence in ADLs.  Recommend HHOT with 24 hour supervision upon discharge.    Follow Up Recommendations  Home health OT;Supervision/Assistance - 24 hour    Equipment Recommendations  3 in 1 bedside commode    Recommendations for Other Services       Precautions / Restrictions Precautions Precautions: Fall Restrictions Weight Bearing Restrictions: No      Mobility Bed Mobility Overal bed mobility: Independent                 Transfers Overall transfer level: Needs assistance Equipment used: None Transfers: Sit to/from Stand Sit to Stand: Min guard              Balance Overall balance assessment: Needs assistance Sitting-balance support: Feet supported;No upper extremity supported Sitting balance-Leahy Scale: Fair     Standing balance support: Single extremity supported Standing balance-Leahy Scale: Fair                             ADL either performed or assessed with clinical judgement   ADL Overall ADL's : Needs assistance/impaired Eating/Feeding: Set up;Sitting   Grooming: Min guard;Standing;Cueing for safety     Upper Body Bathing Details (indicate cue type and reason): not tested   Lower Body Bathing Details (indicate cue type and reason): not tested Upper Body Dressing : Set up;Sitting   Lower Body Dressing: Moderate assistance;Sit to/from stand Lower Body Dressing Details (indicate cue type and reason): OTR provided total assist for donning socks, pt able to slide shoes on himself.  Pt unable to reach feet. Toilet Transfer: Min guard;Cueing for sequencing;Cueing for safety;Grab TEFL teacher Details (indicate cue type and reason): OTR provided mod verbal cues for safety and sequencing Toileting- Clothing Manipulation and Hygiene: Min guard Toileting - Clothing Manipulation Details (indicate cue type and reason): OTR provided min guard assist for pt to stand while pt donned/doffed pants   Tub/Shower Transfer Details (indicate cue type and reason): not tested Functional mobility during ADLs: Min guard;Cueing for sequencing;Cueing for safety General ADL Comments: Pt with unsteady  gait and using surfaces for UE support when available.  Pt with difficult time adhering to RW with trial 2/2 confusion and poor sequencing.  OTR provided min guard and moderate verbal cues for safety and sequencing throughout all functional mobility.     Vision Baseline Vision/History:  Wears glasses Patient Visual Report: No change from baseline       Perception     Praxis      Pertinent Vitals/Pain Pain Assessment: No/denies pain     Hand Dominance Right (has R side weakness from previous CVA, able to use L hand when needed)   Extremity/Trunk Assessment Upper Extremity Assessment Upper Extremity Assessment: Generalized weakness   Lower Extremity Assessment Lower Extremity Assessment: Generalized weakness       Communication Communication Communication: Interpreter utilized   Cognition Arousal/Alertness: Awake/alert Behavior During Therapy: WFL for tasks assessed/performed Overall Cognitive Status: Impaired/Different from baseline Area of Impairment: Attention;Memory;Following commands;Safety/judgement;Problem solving                   Current Attention Level: Alternating Memory: Decreased short-term memory Following Commands: Follows one step commands inconsistently Safety/Judgement: Decreased awareness of safety;Decreased awareness of deficits   Problem Solving: Slow processing;Difficulty sequencing;Requires verbal cues;Requires tactile cues General Comments: Pt with difficulty following one step commands, as well as slow processing and impaired problem solving.   General Comments  HR = 90s and SpO2 high 90s after activity    Exercises Other Exercises Other Exercises: provided education re: OT role and plan of care, fall and safety precautions, self care, functional mobility, toilet transfers Other Exercises: provided supervision assist for bed mobility, min guard and mod verbal cues for safety throughout all functional mobility   Shoulder Instructions      Home Living Family/patient expects to be discharged to:: Private residence Living Arrangements: Children;Other relatives;Spouse/significant other Available Help at Discharge: Family;Available 24 hours/day Type of Home: House Home Access: Ramped entrance     Home Layout: One  level     Bathroom Shower/Tub: Occupational psychologist: Standard     Home Equipment: Cane - single point          Prior Functioning/Environment Level of Independence: Independent with assistive device(s)        Comments: Up until recently, pt has been modified independent in ADLs, occasionally using SPC for mobility.  Pt's daughter in law reports he had one episode recently in which he was too weak to stand up from the toilet and needed assistance from family.        OT Problem List: Decreased strength;Decreased range of motion;Impaired balance (sitting and/or standing);Decreased cognition;Decreased safety awareness;Decreased knowledge of use of DME or AE;Decreased knowledge of precautions      OT Treatment/Interventions: Self-care/ADL training;Therapeutic exercise;DME and/or AE instruction;Therapeutic activities;Cognitive remediation/compensation;Patient/family education;Balance training    OT Goals(Current goals can be found in the care plan section) Acute Rehab OT Goals Patient Stated Goal: to go home OT Goal Formulation: With patient/family Time For Goal Achievement: 11/12/19 Potential to Achieve Goals: Good  OT Frequency: Min 1X/week   Barriers to D/C:            Co-evaluation              AM-PAC OT "6 Clicks" Daily Activity     Outcome Measure Help from another person eating meals?: None Help from another person taking care of personal grooming?: A Little Help from another person toileting, which includes using toliet, bedpan, or urinal?: A Little Help from another  person bathing (including washing, rinsing, drying)?: A Little Help from another person to put on and taking off regular upper body clothing?: None Help from another person to put on and taking off regular lower body clothing?: A Lot 6 Click Score: 19   End of Session Equipment Utilized During Treatment: Gait belt  Activity Tolerance: Patient tolerated treatment well Patient left:  in bed;with call bell/phone within reach;with bed alarm set;with family/visitor present  OT Visit Diagnosis: Other abnormalities of gait and mobility (R26.89);Other symptoms and signs involving cognitive function;Muscle weakness (generalized) (M62.81)                Time: 6950-7225 OT Time Calculation (min): 32 min Charges:  OT General Charges $OT Visit: 1 Visit OT Evaluation $OT Eval Moderate Complexity: 1 Mod OT Treatments $Self Care/Home Management : 23-37 mins  Myrtie Hawk Forrest Demuro, OTR/L 10/29/19, 1:25 PM

## 2019-10-29 NOTE — Progress Notes (Addendum)
SLP Cancellation Note  Patient Details Name: Christopher Murphy MRN: [id number] DOB: [birth year]   Cancelled treatment:       Reason Eval/Treat Not Completed: SLP screened, no needs identified, will sign off (chart reviewed; consulted NSG then met w/ Dtr, pt) Pt denied any difficulty swallowing and is currently on a regular diet w/ Soft foods per Dtr present; tolerates swallowing pills w/ water per NSG. Pt is waiting on Lunch foods being brought from home -- foods he is used to eating per Dtr. SLP encouraged this as pt will enjoy this more and appetite will remain strong. Pt conversed in phrases and conversation w/ Dtr in native language denying any gross deficits; pt and Dtr denied any ongoing hallucinations or confusion; no speech-language deficits. Pt verbalized request for coffee, needs of such. Pt does have a Baseline report of "Vascular Dementia" per chart/NSG notes -- MRI revealed: "No acute intracranial abnormality; Markedly advanced cerebral atrophy with chronic microvascular ischemic disease, mildly progressed as compared to 2019; chronic left PCA territory infarct, stable". Briefly discussed w/ Dtr the impact of change and illness on Baseline Cognitive decline/Dementia and supportive strategies. Encouraged f/u w/ Neurology for more formal assessment of pt's Baseline Cognitive decline/Dementia; status. No further skilled ST services indicated as pt appears at his Baseline. Encouraged f/u w/ PCP and Neurology if any new decline from previous Baseline is noted in cognitive-linguistic fx in ADLs once pt returns home to structured setting/routine away from Acute illness/admit to hospital. Dtr and pt agreed. NSG to reconsult if any change in status while admitted.     Orinda Kenner, MS, CCC-SLP Christopher Murphy 10/29/2019, 12:22 PM

## 2019-10-30 ENCOUNTER — Ambulatory Visit: Payer: 59

## 2019-11-02 ENCOUNTER — Telehealth: Payer: Self-pay

## 2019-11-02 NOTE — Telephone Encounter (Signed)
Christopher Murphy with [facility] requesting verbal orders for [name] 1 x a wk for 1 wk; 2 x a wk for 2 wks and 1 x a wk for 2 wks. Christopher also request written rx sent to [facility] for a 3 in 1 commode and shower chair with arm rest. Ins will not cover but DME will talk with Murphy. Christopher does not know contact phone # or fax # for [facility]. [facility] does not have a DME dept).

## 2019-11-03 ENCOUNTER — Telehealth: Payer: Self-pay

## 2019-11-03 NOTE — Telephone Encounter (Signed)
OK for [facility] PT as requested. Needs to schedule hospital follow up. Can get DME orders at that time.

## 2019-11-03 NOTE — Telephone Encounter (Signed)
Ray with [facility] left v/m requesting a plan of care and assessment visit for Prior authorization already requested faxed to [phone] with ref # [id number] (did not leave name of medication for PA) .

## 2019-11-05 ENCOUNTER — Encounter: Payer: Self-pay | Admitting: Internal Medicine

## 2019-11-05 ENCOUNTER — Other Ambulatory Visit: Payer: Self-pay

## 2019-11-05 ENCOUNTER — Ambulatory Visit (INDEPENDENT_AMBULATORY_CARE_PROVIDER_SITE_OTHER): Payer: 59 | Admitting: Internal Medicine

## 2019-11-05 VITALS — BP 116/70 | HR 76 | Temp 97.2°F | Wt 151.0 lb

## 2019-11-05 DIAGNOSIS — G459 Transient cerebral ischemic attack, unspecified: Secondary | ICD-10-CM | POA: Diagnosis not present

## 2019-11-05 DIAGNOSIS — N3000 Acute cystitis without hematuria: Secondary | ICD-10-CM

## 2019-11-05 DIAGNOSIS — F0150 Vascular dementia without behavioral disturbance: Secondary | ICD-10-CM | POA: Diagnosis not present

## 2019-11-05 DIAGNOSIS — R053 Chronic cough: Secondary | ICD-10-CM

## 2019-11-05 DIAGNOSIS — R410 Disorientation, unspecified: Secondary | ICD-10-CM

## 2019-11-05 DIAGNOSIS — R05 Cough: Secondary | ICD-10-CM

## 2019-11-05 LAB — BASIC METABOLIC PANEL
BUN: 7 mg/dL (ref 6–23)
CO2: 28 mEq/L (ref 19–32)
Calcium: 9 mg/dL (ref 8.4–10.5)
Chloride: 102 mEq/L (ref 96–112)
Creatinine, Ser: 0.98 mg/dL (ref 0.40–1.50)
GFR: 74.78 mL/min (ref >60.00)
Glucose, Bld: 91 mg/dL (ref 70–99)
Potassium: 4.2 mEq/L (ref 3.5–5.1)
Sodium: 135 mEq/L (ref 135–145)

## 2019-11-05 LAB — POC URINALSYSI DIPSTICK (AUTOMATED)
Bilirubin, UA: NEGATIVE
Glucose, UA: NEGATIVE
Nitrite, UA: NEGATIVE
Protein, UA: POSITIVE — AB
Spec Grav, UA: 1.015 (ref 1.010–1.025)
Urobilinogen, UA: 0.2 E.U./dL
pH, UA: 6.5 (ref 5.0–8.0)

## 2019-11-05 NOTE — Progress Notes (Signed)
Subjective:    Patient ID: Christopher Murphy, male    DOB: 11-15-1945, 74 y.o.   MRN: [id number]  HPI  Patient presents to the clinic today for ER follow-up.  He went to the ER 7/21 with complaint of confusion that started 3 days prior.  His daughter reports he was hallucinating, having urinary frequency and urinary incontinence.  He had recently been treated for UTI with Cefdinir.  CT head was negative for acute findings but did show some small vessel ischemic changes.  MRI of the brain did not show any acute stroke.  Carotid ultrasound was negative for significant stenosis.  He also underwent work-up for chronic cough with a CT of the chest which was negative.  This chronic cough has been unresponsive to antihistamines and PPIs OTC.  He is currently being followed by pulmonology for the same.  He was continued on Aspirin, Plavix, Rosuvastatin Zyrtec, Famotidine, Pantoprazole Flonase and Namenda.  He was discharged on 7/22 and advised to follow-up with his PCP.  Since discharge, his daughter in law reports his confusion has improved. He has had some continue urinary incontinence but denies urgency, frequency or dysuria. They do have appts set up for neurology and pulmonology.  Review of Systems      Past Medical History:  Diagnosis Date  . Blood clots in brain 2009  . Stroke [facility])     Current Outpatient Medications  Medication Sig Dispense Refill  . aspirin EC 81 MG tablet Take 81 mg by mouth daily at 6 (six) AM.    . cetirizine (ZYRTEC) 10 MG tablet Take 1 tablet (10 mg total) by mouth daily. 30 tablet 11  . clopidogrel (PLAVIX) 75 MG tablet Take 1 tablet (75 mg total) by mouth daily. 90 tablet 3  . famotidine (PEPCID) 20 MG tablet One after supper (Patient taking differently: Take 20 mg by mouth daily. One after supper) 30 tablet 11  . folic acid (FOLVITE) 1 MG tablet Take 1 mg by mouth daily.    . memantine (NAMENDA) 10 MG tablet Take 1 tablet (10 mg total) by mouth 2 (two) times  daily. 180 tablet 1  . pantoprazole (PROTONIX) 40 MG tablet Take 1 tablet (40 mg total) by mouth daily. Take 30-60 min before first meal of the day 30 tablet 2  . Polyethyl Glycol-Propyl Glycol (SYSTANE) 0.4-0.3 % SOLN Apply 1 drop to eye 2 (two) times daily. 30 mL 5  . rosuvastatin (CRESTOR) 20 MG tablet Take 1 tablet (20 mg total) by mouth daily. 90 tablet 3  . fluticasone (FLONASE) 50 MCG/ACT nasal spray Place 1-2 sprays into both nostrils daily for 7 days. 1 g 0   No current facility-administered medications for this visit.    No Known Allergies  Family History  Problem Relation Age of Onset  . AAA (abdominal aortic aneurysm) Neg Hx     Social History   Socioeconomic History  . Marital status: Married    Spouse name: Not on file  . Number of children: Not on file  . Years of education: Not on file  . Highest education level: Not on file  Occupational History  . Not on file  Tobacco Use  . Smoking status: Never Smoker  . Smokeless tobacco: Never Used  Vaping Use  . Vaping Use: Never used  Substance and Sexual Activity  . Alcohol use: No  . Drug use: No  . Sexual activity: Not on file  Other Topics Concern  . Not on file  Social History Narrative  . Not on file   Social Determinants of Health   Financial Resource Strain:   . Difficulty of Paying Living Expenses:   Food Insecurity:   . Worried About Charity fundraiser in the Last Year:   . Arboriculturist in the Last Year:   Transportation Needs:   . Film/video editor (Medical):   Marland Kitchen Lack of Transportation (Non-Medical):   Physical Activity:   . Days of Exercise per Week:   . Minutes of Exercise per Session:   Stress:   . Feeling of Stress :   Social Connections:   . Frequency of Communication with Friends and Family:   . Frequency of Social Gatherings with Friends and Family:   . Attends Religious Services:   . Active Member of Clubs or Organizations:   . Attends Archivist Meetings:   Marland Kitchen  Marital Status:   Intimate Partner Violence:   . Fear of Current or Ex-Partner:   . Emotionally Abused:   Marland Kitchen Physically Abused:   . Sexually Abused:      Constitutional: Denies fever, malaise, fatigue, headache or abrupt weight changes.  Respiratory: Denies difficulty breathing, shortness of breath, cough or sputum production.   Cardiovascular: Denies chest pain, chest tightness, palpitations or swelling in the hands or feet.  Gastrointestinal: Denies abdominal pain, bloating, constipation, diarrhea or blood in the stool.  GU: Pt reports urinary incontinence. Denies urgency, frequency, pain with urination, burning sensation, blood in urine, odor or discharge. Neurological: Denies dizziness, difficulty with memory, difficulty with speech or problems with balance and coordination.    No other specific complaints in a complete review of systems (except as listed in HPI above).  Objective:   Physical Exam  BP 116/70   Pulse 76   Temp (!) 97.2 F (36.2 C) (Temporal)   Wt 151 lb (68.5 kg)   SpO2 98%   BMI 21.67 kg/m   Wt Readings from Last 3 Encounters:  10/28/19 154 lb (69.9 kg)  10/19/19 151 lb (68.5 kg)  08/27/19 154 lb 3.2 oz (69.9 kg)    General: Appears his stated age, well developed, well nourished in NAD. HEENT: Head: normal shape and size; Eyes: sclera white, no icterus, conjunctiva pink, PERRLA and EOMs intact;  Cardiovascular: Normal rate and rhythm. S1,S2 noted.  No murmur, rubs or gallops noted. No JVD or BLE edema. No carotid bruits noted. Pulmonary/Chest: Normal effort and positive vesicular breath sounds. No respiratory distress. No wheezes, rales or ronchi noted.  Musculoskeletal: Strength 5/5 BUE/BLE. No difficulty with gait.  Neurological: Alert and oriented. . Coordination normal.   BMET    Component Value Date/Time   NA 134 (L) 10/28/2019 0905   K 4.2 10/28/2019 0905   CL 102 10/28/2019 0905   CO2 25 10/28/2019 0905   GLUCOSE 91 10/28/2019 0905   BUN  13 10/28/2019 0905   CREATININE 0.88 10/28/2019 0905   CREATININE 0.85 06/12/2019 1529   CALCIUM 9.0 10/28/2019 0905   GFRNONAA >60 10/28/2019 0905   GFRNONAA 83 02/21/2015 1008   GFRAA >60 10/28/2019 0905   GFRAA >89 02/21/2015 1008    Lipid Panel     Component Value Date/Time   CHOL 129 10/29/2019 0444   CHOL 107 08/27/2019 1544   TRIG 89 10/29/2019 0444   HDL 45 10/29/2019 0444   HDL 35 (L) 08/27/2019 1544   CHOLHDL 2.9 10/29/2019 0444   VLDL 18 10/29/2019 0444   LDLCALC 66 10/29/2019 0444  LDLCALC 45 08/27/2019 1544   LDLCALC 45 06/12/2019 1529    CBC    Component Value Date/Time   WBC 6.4 10/28/2019 0905   RBC 4.76 10/28/2019 0905   HGB 12.9 (L) 10/28/2019 0905   HCT 39.2 10/28/2019 0905   PLT 229 10/28/2019 0905   MCV 82.4 10/28/2019 0905   MCH 27.1 10/28/2019 0905   MCHC 32.9 10/28/2019 0905   RDW 13.7 10/28/2019 0905   LYMPHSABS 1.9 10/19/2019 1001   MONOABS 0.6 10/19/2019 1001   EOSABS 0.2 10/19/2019 1001   BASOSABS 0.0 10/19/2019 1001    Hgb A1C Lab Results  Component Value Date   HGBA1C 5.3 10/29/2019          Assessment & Plan:   Hospital Follow up for TIA, Confusion, Vascular Dementia, UTI and Chronic Cough:  Hospital notes, labs and imaging reviewed Meds reviewed- no changes at this time Will repeat urinaylsis, BMET today He will continue to follow up with neurology and pulmonology as an outpatient  Return precautions discussed Webb Silversmith, NP This visit occurred during the SARS-CoV-2 public health emergency.  Safety protocols were in place, including screening questions prior to the visit, additional usage of staff PPE, and extensive cleaning of exam room while observing appropriate contact time as indicated for disinfecting solutions.

## 2019-11-05 NOTE — Patient Instructions (Signed)
Confusion °Confusion is the inability to think with the usual speed or clarity. People who are confused often describe their thinking as cloudy or unclear. Confusion can also include feeling disoriented. This means you are unaware of where you are or who you are. You may also not know the date or time. When confused, you may have difficulty remembering, paying attention, or making decisions. Some people also act aggressively when they are confused. °In some cases, confusion may come on quickly. In other cases, it may develop slowly over time. How quickly confusion comes on depends on the cause. °Confusion may be caused by: °· Head injury (concussion). °· Seizures. °· Stroke. °· Fever. °· Brain tumor. °· Decrease in brain function due to a vascular or neurologic condition (dementia). °· Emotions, like rage or terror. °· Inability to know what is real and what is not (hallucinations). °· Infections, such as a urinary tract infection (UTI). °· Using too much alcohol, drugs, or medicines. °· Loss of fluid (dehydration) or an imbalance of salts in the body (electrolytes). °· Lack of sleep. °· Low blood sugar (diabetes). °· Low levels of oxygen. This comes from conditions such as chronic lung disorders. °· Side effects of medicines, or taking medicines that affect other medicines (drug interactions). °· Lack of certain nutrients, especially niacin, thiamine, vitamin C, or vitamin B. °· Sudden drop in body temperature (hypothermia). °· Change in routine, such as traveling or being hospitalized. °Follow these instructions at home: °Pay attention to your symptoms. Tell your health care provider about any changes or if you develop new symptoms. Follow these instructions to control or treat symptoms. Ask a family member or friend for help if needed. °Medicines °· Take over-the-counter and prescription medicines only as told by your health care provider. °· Ask your health care provider about changing or stopping any medicines  that may be causing your confusion. °· Avoid pain medicines or sleep medicines until you have fully recovered. °· Use a pillbox or an alarm to help you take the right medicines at the right time. °Lifestyle ° °· Eat a balanced diet that includes fruits and vegetables. °· Get enough sleep. For most adults, this is 7-9 hours each night. °· Do not drink alcohol. °· Do not become isolated. Spend time with other people and make plans for your days. °· Do not drive until your health care provider says that it is safe to do so. °· Do not use any products that contain nicotine or tobacco, such as cigarettes and e-cigarettes. If you need help quitting, ask your health care provider. °· Stop other activities that may increase your chances of getting hurt. These may include some work duties, sports activities, swimming, or bike riding. Ask your health care provider what activities are safe for you. °What caregivers can do °· Find out if the person is confused. Ask the person to state his or her name, age, and the date. If the person is unsure or answers incorrectly, he or she may be confused. °· Always introduce yourself, no matter how well the person knows you. °· Remind the person of his or her location. Do this often. °· Place a calendar and clock near the person who is confused. °· Talk about current events and plans for the day. °· Keep the environment calm, quiet, and peaceful. °· Help the person do the things that he or she is unable to do. These include: °? Taking medicines. °? Keeping follow-up visits with his or her health care   provider. °? Helping with household duties, including meal preparation. °? Running errands. °· Get help if you need it. There are several support groups for caregivers. °· If the person you are helping needs more support, consider day care, extended care programs, or a skilled nursing facility. The person's health care provider may be able to help evaluate these options. °General  instructions °· Monitor yourself for any conditions you may have. These may include: °? Checking your blood glucose levels, if you have diabetes. °? Watching your weight, if you are overweight. °? Monitoring your blood pressure, if you have hypertension. °? Monitoring your body temperature, if you have a fever. °· Keep all follow-up visits as told by your health care provider. This is important. °Contact a health care provider if: °· Your symptoms get worse. °Get help right away if you: °· Feel that you are not able to care for yourself. °· Develop severe headaches, repeated vomiting, seizures, blackouts, or slurred speech. °· Have increasing confusion, weakness, numbness, restlessness, or personality changes. °· Develop a loss of balance, have marked dizziness, feel uncoordinated, or fall. °· Develop severe anxiety, or you have delusions or hallucinations. °These symptoms may represent a serious problem that is an emergency. Do not wait to see if the symptoms will go away. Get medical help right away. Call your local emergency services (911 in the [place] Do not drive yourself to the hospital. °Summary °· Confusion is the inability to think with the usual speed or clarity. People who are confused often describe their thinking as cloudy or unclear. °· Confusion can also include having difficulty remembering, paying attention, or making decisions. °· Confusion may come on quickly or develop slowly over time, depending on the cause. There are many different causes of confusion. °· Ask for help from family members or friends if you are unable to take care of yourself. °This information is not intended to replace advice given to you by your health care provider. Make sure you discuss any questions you have with your health care provider. °Document Revised: 03/28/2017 Document Reviewed: 03/28/2017 °Elsevier Patient Education © 2020 [employer] ° °

## 2019-11-06 ENCOUNTER — Encounter: Payer: Self-pay | Admitting: Internal Medicine

## 2019-11-06 DIAGNOSIS — R3129 Other microscopic hematuria: Secondary | ICD-10-CM

## 2019-11-06 LAB — URINE CULTURE
MICRO NUMBER:: 10765254
SPECIMEN QUALITY:: ADEQUATE

## 2019-11-06 NOTE — Telephone Encounter (Signed)
VO left on VM.  °

## 2019-11-08 ENCOUNTER — Encounter: Payer: Self-pay | Admitting: Internal Medicine

## 2019-11-09 NOTE — Telephone Encounter (Cosign Needed)
Please schedule for Friday 8/6, work in. 30 min. Lump in groin, hematuria

## 2019-11-10 ENCOUNTER — Telehealth: Payer: Self-pay

## 2019-11-10 NOTE — Telephone Encounter (Cosign Needed)
Ok to hold PT

## 2019-11-10 NOTE — Telephone Encounter (Signed)
Amy Moon PT with [facility] left v/m that pts daughter in law request hold on PT this week due to lump in lt pelvic area.Amy said to her knowledge this was not an emergent situation. Pt has appt 11/13/19 at 1 PM with R Baity NP.  Juluis Rainier to Avie Echevaria NP as PCP and Dr Silvio Pate as being in office.

## 2019-11-11 NOTE — Telephone Encounter (Signed)
noted 

## 2019-11-13 ENCOUNTER — Encounter: Payer: Self-pay | Admitting: Internal Medicine

## 2019-11-13 ENCOUNTER — Other Ambulatory Visit: Payer: Self-pay

## 2019-11-13 ENCOUNTER — Ambulatory Visit (INDEPENDENT_AMBULATORY_CARE_PROVIDER_SITE_OTHER): Payer: 59 | Admitting: Internal Medicine

## 2019-11-13 VITALS — BP 108/58 | HR 85 | Temp 96.0°F | Wt 151.0 lb

## 2019-11-13 DIAGNOSIS — R8290 Unspecified abnormal findings in urine: Secondary | ICD-10-CM | POA: Diagnosis not present

## 2019-11-13 DIAGNOSIS — K4090 Unilateral inguinal hernia, without obstruction or gangrene, not specified as recurrent: Secondary | ICD-10-CM

## 2019-11-13 LAB — POC URINALSYSI DIPSTICK (AUTOMATED)
Bilirubin, UA: NEGATIVE
Glucose, UA: NEGATIVE
Ketones, UA: NEGATIVE
Leukocytes, UA: NEGATIVE
Nitrite, UA: NEGATIVE
Protein, UA: POSITIVE — AB
Spec Grav, UA: 1.015 (ref 1.010–1.025)
Urobilinogen, UA: 0.2 E.U./dL
pH, UA: 6 (ref 5.0–8.0)

## 2019-11-13 NOTE — Progress Notes (Signed)
Subjective:    Patient ID: Christopher Murphy, male    DOB: Feb 15, 1946, 74 y.o.   MRN: [id number]  HPI  Patient presents to the clinic today with complaint of a mass to his left groin.  He is accompanied by his daughter-in-law who is serving as his interpreter.  He reports the mass has been there for a long time but cannot tell us whether this is months or years.  He reports the mass is not tender or painful.  Sometimes it swells and other times it does not.  There is no pain associated with this mass.  He denies abdominal pain, urinary issues or hip pain.  He has not taken anything OTC for this.  He is also due to follow-up on his urinalysis.  His last urinalysis showed large blood, trace leukocytes and positive protein but his urine culture was negative.  He reports he was told that he had a prostatectomy about 20 years ago.   Review of Systems   Past Medical History:  Diagnosis Date  . Blood clots in brain 2009  . Stroke [facility])     Current Outpatient Medications  Medication Sig Dispense Refill  . aspirin EC 81 MG tablet Take 81 mg by mouth daily at 6 (six) AM.    . cetirizine (ZYRTEC) 10 MG tablet Take 1 tablet (10 mg total) by mouth daily. 30 tablet 11  . clopidogrel (PLAVIX) 75 MG tablet Take 1 tablet (75 mg total) by mouth daily. 90 tablet 3  . famotidine (PEPCID) 20 MG tablet One after supper (Patient taking differently: Take 20 mg by mouth daily. One after supper) 30 tablet 11  . folic acid (FOLVITE) 1 MG tablet Take 1 mg by mouth daily.    . memantine (NAMENDA) 10 MG tablet Take 1 tablet (10 mg total) by mouth 2 (two) times daily. 180 tablet 1  . pantoprazole (PROTONIX) 40 MG tablet Take 1 tablet (40 mg total) by mouth daily. Take 30-60 min before first meal of the day 30 tablet 2  . Polyethyl Glycol-Propyl Glycol (SYSTANE) 0.4-0.3 % SOLN Apply 1 drop to eye 2 (two) times daily. 30 mL 5  . rosuvastatin (CRESTOR) 20 MG tablet Take 1 tablet (20 mg total) by mouth daily. 90  tablet 3  . fluticasone (FLONASE) 50 MCG/ACT nasal spray Place 1-2 sprays into both nostrils daily for 7 days. 1 g 0   No current facility-administered medications for this visit.    No Known Allergies  Family History  Problem Relation Age of Onset  . AAA (abdominal aortic aneurysm) Neg Hx     Social History   Socioeconomic History  . Marital status: Married    Spouse name: Not on file  . Number of children: Not on file  . Years of education: Not on file  . Highest education level: Not on file  Occupational History  . Not on file  Tobacco Use  . Smoking status: Never Smoker  . Smokeless tobacco: Never Used  Vaping Use  . Vaping Use: Never used  Substance and Sexual Activity  . Alcohol use: No  . Drug use: No  . Sexual activity: Not on file  Other Topics Concern  . Not on file  Social History Narrative  . Not on file   Social Determinants of Health   Financial Resource Strain:   . Difficulty of Paying Living Expenses:   Food Insecurity:   . Worried About Charity fundraiser in the Last Year:   .  Ran Out of Food in the Last Year:   Transportation Needs:   . Film/video editor (Medical):   Marland Kitchen Lack of Transportation (Non-Medical):   Physical Activity:   . Days of Exercise per Week:   . Minutes of Exercise per Session:   Stress:   . Feeling of Stress :   Social Connections:   . Frequency of Communication with Friends and Family:   . Frequency of Social Gatherings with Friends and Family:   . Attends Religious Services:   . Active Member of Clubs or Organizations:   . Attends Archivist Meetings:   Marland Kitchen Marital Status:   Intimate Partner Violence:   . Fear of Current or Ex-Partner:   . Emotionally Abused:   Marland Kitchen Physically Abused:   . Sexually Abused:      Constitutional: Denies fever, malaise, fatigue, headache or abrupt weight changes.  Respiratory: Denies difficulty breathing, shortness of breath, cough or sputum production.   Cardiovascular:  Denies chest pain, chest tightness, palpitations or swelling in the hands or feet.  Gastrointestinal: Denies abdominal pain, bloating, constipation, diarrhea or blood in the stool.  GU: Denies urgency, frequency, pain with urination, burning sensation, blood in urine, odor or discharge. Skin: Pt reports lump of left groin. Denies redness, rashes, lesions or ulcercations.    No other specific complaints in a complete review of systems (except as listed in HPI above).   Objective:   Physical Exam   BP (!) 108/58   Pulse 85   Temp (!) 96 F (35.6 C) (Temporal)   Wt 151 lb (68.5 kg)   SpO2 96%   BMI 21.67 kg/m  Wt Readings from Last 3 Encounters:  11/13/19 151 lb (68.5 kg)  11/05/19 151 lb (68.5 kg)  10/28/19 154 lb (69.9 kg)    General: Appears his stated age, well developed, well nourished in NAD. Cardiovascular: Normal rate and rhythm. S1,S2 noted.  No murmur, rubs or gallops noted.  Pulmonary/Chest: Normal effort and positive vesicular breath sounds. No respiratory distress. No wheezes, rales or ronchi noted.  Abdomen: Soft and nontender. Normal bowel sounds. Inguinal hernia noted in the left groin. Musculoskeletal: No difficulty with gait.  Neurological: Alert and oriented.   BMET    Component Value Date/Time   NA 135 11/05/2019 1232   K 4.2 11/05/2019 1232   CL 102 11/05/2019 1232   CO2 28 11/05/2019 1232   GLUCOSE 91 11/05/2019 1232   BUN 7 11/05/2019 1232   CREATININE 0.98 11/05/2019 1232   CREATININE 0.85 06/12/2019 1529   CALCIUM 9.0 11/05/2019 1232   GFRNONAA >60 10/28/2019 0905   GFRNONAA 83 02/21/2015 1008   GFRAA >60 10/28/2019 0905   GFRAA >89 02/21/2015 1008    Lipid Panel     Component Value Date/Time   CHOL 129 10/29/2019 0444   CHOL 107 08/27/2019 1544   TRIG 89 10/29/2019 0444   HDL 45 10/29/2019 0444   HDL 35 (L) 08/27/2019 1544   CHOLHDL 2.9 10/29/2019 0444   VLDL 18 10/29/2019 0444   LDLCALC 66 10/29/2019 0444   LDLCALC 45 08/27/2019  1544   LDLCALC 45 06/12/2019 1529    CBC    Component Value Date/Time   WBC 6.4 10/28/2019 0905   RBC 4.76 10/28/2019 0905   HGB 12.9 (L) 10/28/2019 0905   HCT 39.2 10/28/2019 0905   PLT 229 10/28/2019 0905   MCV 82.4 10/28/2019 0905   MCH 27.1 10/28/2019 0905   MCHC 32.9 10/28/2019 0905  RDW 13.7 10/28/2019 0905   LYMPHSABS 1.9 10/19/2019 1001   MONOABS 0.6 10/19/2019 1001   EOSABS 0.2 10/19/2019 1001   BASOSABS 0.0 10/19/2019 1001    Hgb A1C Lab Results  Component Value Date   HGBA1C 5.3 10/29/2019           Assessment & Plan:   Inguinal Hernia:  No associated pain He is agreeable to monitoring at this time Discussed referral to general surgery if develops pain Encouraged him to avoid heavy lifting as this could exacerbate these symptoms  Abnormal Urinalysis:  He was unable to urinate in the office Cup provided- he will drop off a sample   Return precautions discussed  Webb Silversmith, NP This visit occurred during the SARS-CoV-2 public health emergency.  Safety protocols were in place, including screening questions prior to the visit, additional usage of staff PPE, and extensive cleaning of exam room while observing appropriate contact time as indicated for disinfecting solutions.

## 2019-11-13 NOTE — Patient Instructions (Signed)
° °Inguinal Hernia, Adult °An inguinal hernia is when fat or your intestines push through a weak spot in a muscle where your leg meets your lower belly (groin). This causes a rounded lump (bulge). This kind of hernia could also be: °· In your scrotum, if you are male. °· In folds of skin around your vagina, if you are male. °There are three types of inguinal hernias. These include: °· Hernias that can be pushed back into the belly (are reducible). This type rarely causes pain. °· Hernias that cannot be pushed back into the belly (are incarcerated). °· Hernias that cannot be pushed back into the belly and lose their blood supply (are strangulated). This type needs emergency surgery. °If you do not have symptoms, you may not need treatment. If you have symptoms or a large hernia, you may need surgery. °Follow these instructions at home: °Lifestyle °· Do these things if told by your doctor so you do not have trouble pooping (constipation): °? Drink enough fluid to keep your pee (urine) pale yellow. °? Eat foods that have a lot of fiber. These include fresh fruits and vegetables, whole grains, and beans. °? Limit foods that are high in fat and processed sugars. These include foods that are fried or sweet. °? Take medicine for trouble pooping. °· Avoid lifting heavy objects. °· Avoid standing for long amounts of time. °· Do not use any products that contain nicotine or tobacco. These include cigarettes and e-cigarettes. If you need help quitting, ask your doctor. °· Stay at a healthy weight. °General instructions °· You may try to push your hernia in by very gently pressing on it when you are lying down. Do not try to force the bulge back in if it will not push in easily. °· Watch your hernia for any changes in shape, size, or color. Tell your doctor if you see any changes. °· Take over-the-counter and prescription medicines only as told by your doctor. °· Keep all follow-up visits as told by your doctor. This is  important. °Contact a doctor if: °· You have a fever. °· You have new symptoms. °· Your symptoms get worse. °Get help right away if: °· The area where your leg meets your lower belly has: °? Pain that gets worse suddenly. °? A bulge that gets bigger suddenly, and it does not get smaller after that. °? A bulge that turns red or purple. °? A bulge that is painful when you touch it. °· You are a man, and your scrotum: °? Suddenly feels painful. °? Suddenly changes in size. °· You cannot push the hernia in by very gently pressing on it when you are lying down. Do not try to force the bulge back in if it will not push in easily. °· You feel sick to your stomach (nauseous), and that feeling does not go away. °· You throw up (vomit), and that keeps happening. °· You have a fast heartbeat. °· You cannot poop (have a bowel movement) or pass gas. °These symptoms may be an emergency. Do not wait to see if the symptoms will go away. Get medical help right away. Call your local emergency services (911 in the [place] °Summary °· An inguinal hernia is when fat or your intestines push through a weak spot in a muscle where your leg meets your lower belly (groin). This causes a rounded lump (bulge). °· If you do not have symptoms, you may not need treatment. If you have symptoms or a large hernia,   you may need surgery. °· Avoid lifting heavy objects. Also avoid standing for long amounts of time. °· Do not try to force the bulge back in if it will not push in easily. °This information is not intended to replace advice given to you by your health care provider. Make sure you discuss any questions you have with your health care provider. °Document Revised: 04/27/2017 Document Reviewed: 12/26/2016 °Elsevier Patient Education © 2020 [employer] Inc[employer] ° °

## 2019-11-13 NOTE — Addendum Note (Signed)
Addended by: Lurlean Nanny on: 11/13/2019 05:43 PM   Modules accepted: Orders

## 2019-11-17 ENCOUNTER — Telehealth: Payer: Self-pay

## 2019-11-17 ENCOUNTER — Encounter: Payer: Self-pay | Admitting: Neurology

## 2019-11-17 ENCOUNTER — Ambulatory Visit (INDEPENDENT_AMBULATORY_CARE_PROVIDER_SITE_OTHER): Payer: 59 | Admitting: Neurology

## 2019-11-17 VITALS — BP 99/58 | HR 77 | Ht 69.0 in | Wt 150.0 lb

## 2019-11-17 DIAGNOSIS — F0150 Vascular dementia without behavioral disturbance: Secondary | ICD-10-CM | POA: Diagnosis not present

## 2019-11-17 MED ORDER — DONEPEZIL HCL 10 MG PO TABS
10.0000 mg | ORAL_TABLET | Freq: Every day | ORAL | 3 refills | Status: DC
Start: 2019-11-17 — End: 2020-03-02

## 2019-11-17 NOTE — Patient Instructions (Signed)
I had a long discussion with the patient and his daughter-in-law regarding his dementia and remote stroke and answered questions.  I recommend he discontinue Plavix and stay on aspirin alone because of his hematuria and maintain aggressive risk factor modification with strict control of hypertension with blood pressure goal below 130/90, lipids with LDL cholesterol goal below 70 mg percent and diabetes with hemoglobin A1c goal below 6.5%.  Continue Namenda 10 mg twice daily for his dementia as well as a trial of Aricept 5 mg daily for 1 month to be increased to 10 mg daily if tolerated without side effects.  He was advised to continue follow-up with his primary care physician for work-up for his hematuria.  He will return for follow-up in the future in 3 months or call earlier if necessary.

## 2019-11-17 NOTE — Progress Notes (Signed)
[facility] [address] [place]. [place] [address] [phone]       OFFICE FOLLOW UP VISIT NOTE  [name] Date of Birth:  02/16/1946 Medical Record Number:  [id number]   Referring MD: Webb Silversmith, NP Reason for Referral: TIA HPI: Initial visit 03/11/2018: Mr Christopher Murphy is a pleasant 74 year old [place] Sikh male who seen today for initial office consultation visit.  He is accompanied by his son and daughter-in-law who provide most of the history.  I have reviewed imaging films in PACS.  The patient has had recurrent transient episodes of speech difficulties for the last 10 years.  The episodes are stereotypical.  The patient struggles to speak at times and get words out.  At times his speech is gibberish and nonsensical.  These typically last only few minutes around 5-10 or so but he has had a couple episodes which lasted for an hour or longer.  During these prolonged episodes he is been found to be confused and disoriented.  Interestingly patient had a similar episode on 04/05/2016 when he was admitted to [facility].  He had MRI scan of the brain at that time which I personally reviewed and shows interesting diffuse hyperintensity throughout the cerebral white matter raising concern for seizures.  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  EEG was apparently not done.  Patient was not started on seizure medications.  Patient has history of left posterior cerebral artery infarct in 2009 in [place].  He was placed on a combination tablet of aspirin and Plavix as well as medication for lipids.  He had cognitive impairment and mild memory loss and was started on Namenda as well.  He is presently on Namenda 5 mg twice daily.  Family states that he is cognitively stable and does have short-term memory difficulties but long-term memory is good.  He is living  at home with family.  He needs only mild help with activities of daily living.  He can ambulate independently.  He has not had any witnessed generalized tonic-clonic seizure significant head injury or loss of consciousness.  The patient actually had gone to [place] following this episode in December 2017 for nearly 3 years before he came back.  He was seen by neurologist there but details are not known.  He was never placed on seizure medications. Update 08/27/2019 : He returns for follow-up after last visit a year and half ago.  Is accompanied by his son.  The patient continues to have mild memory and cognitive difficulties which appear to be unchanged and not particularly progressive.  He remains on Namenda 5 mg twice daily which is tolerating well.  He has never tried a higher dose.  Patient is not had many episodes of transient speech difficulties since her last visit.  He did undergo MRI scan of the brain on 03/31/2018 which showed old left PCA as well as few remote age lacunar infarcts and changes of small vessel disease.  No acute abnormalities.  MRA of the brain and neck both did not show any large vessel stenosis or occlusion.  EEG on 03/25/2018 was normal.  Patient had a colonoscopy done on 04/30/2019 and had a 3 mm polyp excised.  He has been having some swallowing difficulties of late and saw gastroenterologist in [place].  Modified barium swallow was done by speech therapy there but I do not have the report but the  patient's son was informed that it was fine.  He has no new complaints. Update 11/17/2019 : He returns for follow-up after last visit 3 months ago.  Is accompanied by his daughter-in-law.  They state that he had a episode of increasing confusion and hallucination as well as delusion lasting 3 days following a bladder infection and treatment with antibiotics.  He was admitted to the hospital and UA showed mild microscopic hematuria and he had recent UTI treatment.  Chest x-ray was  unremarkable.  Chest CT was also done which showed no significant abnormalities.  MRI scan of the brain showing progressive cerebral atrophy and old left PCA infarct but no acute abnormality.  2D echo showed normal ejection fraction.  Carotid ultrasound showed no significant extracranial stenosis.  His confusion improved over 3 days and he has done very well for the last week or so now and is at home.  Is back to his baseline.  He has a renal ultrasound pending for his hematuria.  He remains on aspirin and Plavix which is tolerating well without any external bleeding or bruising.  Mini-Mental status exam today scored 18/30 with deficits in orientation and recall.  He was able to copy intersecting pentagons but difficulty naming animals.  He has not been tried on Aricept so far.  He has not had recurrent TIA or stroke symptoms.  He has been taking Namenda 10 mg twice daily now for several months but family has not noticed any benefit.  Patient is currently not having any delusions, hallucinations, agitation or unsafe behavior.  He can walk independently but his gait has become more stooped but he has not had falls or injuries.  ROS:   14 system review of systems is positive for speech difficulties, memory loss, confusion and all other systems negative  PMH:  Past Medical History:  Diagnosis Date  . Blood clots in brain 2009  . Stroke [facility])     Social History:  Social History   Socioeconomic History  . Marital status: Married    Spouse name: Not on file  . Number of children: Not on file  . Years of education: Not on file  . Highest education level: Not on file  Occupational History  . Not on file  Tobacco Use  . Smoking status: Never Smoker  . Smokeless tobacco: Never Used  Vaping Use  . Vaping Use: Never used  Substance and Sexual Activity  . Alcohol use: No  . Drug use: No  . Sexual activity: Not on file  Other Topics Concern  . Not on file  Social History Narrative  . Not on file     Social Determinants of Health   Financial Resource Strain:   . Difficulty of Paying Living Expenses:   Food Insecurity:   . Worried About Charity fundraiser in the Last Year:   . Arboriculturist in the Last Year:   Transportation Needs:   . Film/video editor (Medical):   Marland Kitchen Lack of Transportation (Non-Medical):   Physical Activity:   . Days of Exercise per Week:   . Minutes of Exercise per Session:   Stress:   . Feeling of Stress :   Social Connections:   . Frequency of Communication with Friends and Family:   . Frequency of Social Gatherings with Friends and Family:   . Attends Religious Services:   . Active Member of Clubs or Organizations:   . Attends Archivist Meetings:   Marland Kitchen Marital  Status:   Intimate Partner Violence:   . Fear of Current or Ex-Partner:   . Emotionally Abused:   Marland Kitchen Physically Abused:   . Sexually Abused:     Medications:   Current Outpatient Medications on File Prior to Visit  Medication Sig Dispense Refill  . aspirin EC 81 MG tablet Take 81 mg by mouth daily at 6 (six) AM.    . cetirizine (ZYRTEC) 10 MG tablet Take 1 tablet (10 mg total) by mouth daily. 30 tablet 11  . famotidine (PEPCID) 20 MG tablet One after supper (Patient taking differently: Take 20 mg by mouth daily. One after supper) 30 tablet 11  . folic acid (FOLVITE) 1 MG tablet Take 1 mg by mouth daily.    . memantine (NAMENDA) 10 MG tablet Take 1 tablet (10 mg total) by mouth 2 (two) times daily. 180 tablet 1  . pantoprazole (PROTONIX) 40 MG tablet Take 1 tablet (40 mg total) by mouth daily. Take 30-60 min before first meal of the day 30 tablet 2  . Polyethyl Glycol-Propyl Glycol (SYSTANE) 0.4-0.3 % SOLN Apply 1 drop to eye 2 (two) times daily. 30 mL 5  . rosuvastatin (CRESTOR) 20 MG tablet Take 1 tablet (20 mg total) by mouth daily. 90 tablet 3  . fluticasone (FLONASE) 50 MCG/ACT nasal spray Place 1-2 sprays into both nostrils daily for 7 days. 1 g 0   No current  facility-administered medications on file prior to visit.    Allergies:  No Known Allergies  Physical Exam General: Frail elderly [place] Sikh male, seated, in no evident distress Head: head normocephalic and atraumatic.   Neck: supple with no carotid or supraclavicular bruits Cardiovascular: regular rate and rhythm, soft ejection systolic murmur. Musculoskeletal: no deformity Skin:  no rash/petichiae Vascular:  Normal pulses all extremities  Neurologic Exam Mental Status: Awake and fully alert. Oriented to place and time. Recent and remote memory diminished. Attention span, concentration and fund of knowledge slightly reduced.  Mini-Mental status exam not done.  Diminished recall 1/3.  Mood and affect appropriate.  Able to name only 5 animals which can walk on 4 legs.  Able to copy intersecting pentagons well.  Clock drawing 4/4 Cranial Nerves: Fundoscopic exam reveals sharp disc margins. Pupils equal, briskly reactive to light. Extraocular movements full without nystagmus mild mechanical ptosis of the left eye.. Visual fields full to confrontation. Hearing intact. Facial sensation intact. Face, tongue, palate moves normally and symmetrically.  Motor: Normal bulk and tone. Normal strength in all tested extremity muscles. Sensory.: intact to touch , pinprick , position and vibratory sensation.  Coordination: Rapid alternating movements normal in all extremities. Finger-to-nose and heel-to-shin performed accurately bilaterally. Gait and Station: Arises from chair without difficulty. Stance is slightly stooped l. Gait demonstrates some festination and initiation apraxia and short steps..  Drags right leg slightly while walking.   Reflexes: 1+ and symmetric. Toes downgoing.       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  Vascular risk factors of hyperlipidemia, age and cerebrovascular disease.     PLAN: I had a long discussion with the patient and his daughter-in-law regarding his dementia and remote stroke  and answered questions.  I recommend he discontinue Plavix and stay on aspirin alone because of his hematuria and maintain aggressive risk factor modification with strict control of hypertension with blood pressure goal below 130/90, lipids with LDL cholesterol goal below 70 mg percent and diabetes with hemoglobin A1c goal below 6.5%.  Continue Namenda 10 mg twice daily for his dementia as well as a trial of Aricept 5 mg daily for 1 month to be increased to 10 mg daily if tolerated without side effects.  He was advised to continue follow-up with his primary care physician for work-up for his hematuria.  He will return for follow-up in the future in 3 months or call earlier if necessary. Greater than 50% time during this 35-minute visit was spent on counseling and coordination of care about his episode of confusion, remote TIA and mild dementia and answering questions.  He will return for follow-up after his return from there hence no scheduled follow-up appointment was made today. Antony Contras, MD  [facility] [address] [place], [place] [address]  Phone [phone] Fax [phone] Note: This document was prepared with digital dictation and possible smart phrase technology. Any transcriptional errors that result from this process are unintentional.

## 2019-11-17 NOTE — Telephone Encounter (Signed)
Amy Moon PT with [facility] left v/m that pt missed PT appt last wk and Amy requesting verbal order to reschedule last wk missed appt for PT to the end of this week. Amy request cb.

## 2019-11-17 NOTE — Telephone Encounter (Signed)
OK for [facility] PT as reqeusted

## 2019-11-18 ENCOUNTER — Ambulatory Visit
Admission: RE | Admit: 2019-11-18 | Discharge: 2019-11-18 | Disposition: A | Discharge status: Home / Self Care | Payer: 59 | Source: Ambulatory Visit | Attending: Internal Medicine | Admitting: Internal Medicine

## 2019-11-18 ENCOUNTER — Other Ambulatory Visit: Payer: Self-pay

## 2019-11-18 DIAGNOSIS — R3129 Other microscopic hematuria: Secondary | ICD-10-CM | POA: Insufficient documentation

## 2019-11-18 NOTE — Telephone Encounter (Signed)
VO given as instructed  

## 2019-12-03 ENCOUNTER — Other Ambulatory Visit: Payer: Self-pay | Admitting: Neurology

## 2019-12-03 ENCOUNTER — Other Ambulatory Visit: Payer: Self-pay | Admitting: Internal Medicine

## 2019-12-03 DIAGNOSIS — R053 Chronic cough: Secondary | ICD-10-CM

## 2019-12-08 ENCOUNTER — Ambulatory Visit (INDEPENDENT_AMBULATORY_CARE_PROVIDER_SITE_OTHER): Payer: 59 | Admitting: Internal Medicine

## 2019-12-08 ENCOUNTER — Other Ambulatory Visit: Payer: Self-pay

## 2019-12-08 ENCOUNTER — Encounter: Payer: Self-pay | Admitting: Internal Medicine

## 2019-12-08 DIAGNOSIS — R053 Chronic cough: Secondary | ICD-10-CM

## 2019-12-08 DIAGNOSIS — R05 Cough: Secondary | ICD-10-CM | POA: Diagnosis not present

## 2019-12-08 NOTE — Progress Notes (Signed)
Christopher Murphy, male    DOB: 1945-06-19, 74 y.o.   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 Pulmonary Consult    Referred by Christopher Silversmith, NP. Pt c/o cough x 8 months- occ prod with clear sputum.    onset of worse cough was w/n a week or two p returned from last trip to [place] in Jan 2020 - early November 2020  > eval with neg covid testing on 02/16/2019 and 04/27/19 and cough ever since/ minimal mucoid production with throat clearing and assoc sense of pnds  Dyspnea:  Not limited by breathing from desired activities   Cough: sporadic  Day > noct  Sleep: able to sleep fine flat no flare  SABA use: none Taking cough drops from [place] ? Contents  rec Pantoprazole (protonix) 40 mg   Take  30-60 min before first meal of the day and Pepcid (famotidine)  20 mg one after supper  until return to office - this is the best way to tell whether stomach acid is contributing to your problem.   GERD diet  Please remember to go to the lab department   for your tests - we will call you with the results when they are available. ENT eval  >  Christopher Murphy > rec gabapentin    12/08/2019  f/u ov/Christopher Murphy re: uacs  - did not bring meds Chief Complaint  Patient presents with  . Follow-up    Has a bad cough at times.  Dyspnea:  No doe  Cough: dry cough spells daytime only, some better sipping water   Sleeping: no noct cough SABA use: none  02: not    No obvious day to day or daytime variability or assoc excess/ purulent sputum or mucus plugs or hemoptysis or cp  or chest tightness, subjective wheeze or overt sinus or hb symptoms.   Sleeping flat  without nocturnal  or early am exacerbation  of respiratory  c/o's or need for noct saba. Also denies any obvious fluctuation of symptoms with weather or environmental changes or other aggravating or alleviating factors except as outlined above   No unusual exposure hx or h/o childhood pna/ asthma or knowledge of premature birth.  Current Allergies, Complete Past Medical History, Past Surgical History, Family History, and Social History were reviewed in [employer] record.  ROS  The following are not active complaints unless bolded Hoarseness, sore throat, dysphagia, dental problems, itching, sneezing,  nasal congestion or discharge of excess mucus or purulent secretions, ear ache,   fever, chills, sweats, unintended wt loss or wt gain, classically pleuritic or exertional cp,  orthopnea pnd or arm/hand swelling  or leg swelling, presyncope, palpitations, abdominal pain, anorexia, nausea, vomiting, diarrhea  or change in bowel habits or change in bladder habits, change in stools or change in urine, dysuria, hematuria,  rash, arthralgias, visual complaints, headache, numbness, weakness or ataxia or problems with walking or coordination,  change in mood or  memory.  Current Meds  Medication Sig  . aspirin EC 81 MG tablet Take 81 mg by mouth daily at 6 (six) AM.  . cetirizine (ZYRTEC) 10 MG tablet Take 1 tablet (10 mg total) by mouth daily.  Marland Kitchen donepezil (ARICEPT) 10 MG tablet Take 1 tablet (10 mg total) by mouth at bedtime. Start 1/2 tablet daily x 1 month and then 1 tablet daily  . famotidine (PEPCID) 20 MG tablet One after supper (Patient taking differently: Take 20 mg by mouth daily. One after supper)  . folic acid (FOLVITE) 1 MG tablet Take 1 mg by mouth daily.  Marland Kitchen gabapentin (NEURONTIN) 300 MG capsule Take 300 mg by mouth 2 (two) times daily.  . memantine (NAMENDA) 10 MG tablet Take  1 tablet (10 mg total) by mouth 2 (two) times daily.  . pantoprazole (PROTONIX) 40 MG tablet TAKE 1 TABLET (40 MG TOTAL) BY MOUTH DAILY. TAKE 30-60 MIN BEFORE FIRST MEAL OF THE DAY  . Polyethyl Glycol-Propyl Glycol (SYSTANE) 0.4-0.3 % SOLN Apply 1 drop to eye 2 (two) times daily.  . rosuvastatin (CRESTOR) 20 MG tablet Take 1 tablet (20 mg total) by mouth daily.         Past Medical History:  Diagnosis Date  . Blood clots in brain 2009  . Stroke [facility])        Objective:    amb Ind[place] male occ throat clearing  Followed by coughing fit  Wt Readings from Last 3 Encounters:  12/08/19 149 lb 12.8 oz (67.9 kg)  11/17/19 150 lb (68 kg)  11/13/19 151 lb (68.5 kg)     Vital signs reviewed - Note on arrival 12/08/2019  02 sats  97% on RA   HEENT : pt wearing mask not removed for exam due to covid -19 concerns.    NECK :  without JVD/Nodes/TM/ nl carotid upstrokes bilaterally   LUNGS: no acc muscle use,  Nl contour chest which is clear to A and P bilaterally without cough on insp or exp maneuvers   CV:  RRR  no s3 or murmur or increase in P2, and no edema   ABD:  soft and nontender with nl inspiratory excursion in the supine position. No bruits or organomegaly appreciated, bowel sounds nl  MS:  Nl gait/ ext warm without deformities, calf tenderness, cyanosis or clubbing No obvious joint restrictions   SKIN: warm and dry without lesions    NEURO:  alert, approp, nl sensorium with  no motor or cerebellar deficits apparent.                       Assessment

## 2019-12-08 NOTE — Assessment & Plan Note (Addendum)
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   These are the same pts (now being labeled as having "irritable larynx syndrome" by some cough centers) who not infrequently have a history of having failed to tolerate ace inhibitors,  dry powder inhalers or biphosphonates or report having atypical/extraesophageal reflux symptoms that don't respond to standard doses of PPI  and are easily confused as having aecopd or asthma flares by even experienced allergists/ pulmonologists (myself included).   >>> agree need to try push gabapentin to highest dose tolerated/ needed to prevent throat clearing with f/u ENT planned.  If not better then needs to return to see one of our doctors who can speak directly to him re further w/u but must return with all meds in hand using a trust but verify approach to confirm accurate Medication  Reconciliation The principal here is that until we are certain that the  patients are doing what we've asked, it  makes no sense to ask them to do more.          Each maintenance medication was reviewed in detail including emphasizing most importantly the difference between maintenance and prns and under what circumstances the prns are to be triggered using an action plan format where appropriate.  Total time for H and P, chart review, counseling,   and generating customized AVS unique to this office visit / charting = 20 min

## 2019-12-08 NOTE — Patient Instructions (Signed)
We will arrange for you to see one of our Punjabi speaking of doctors  (or Hindi option too) but you must bring all medication and throat lozenges/ candies   Ok to cancel if working with Benjamine Mola can eliminate the urge to cough by adjusting the gabapentin upward

## 2019-12-17 ENCOUNTER — Telehealth: Payer: Self-pay | Admitting: Internal Medicine

## 2019-12-21 ENCOUNTER — Other Ambulatory Visit: Payer: Self-pay | Admitting: Neurology

## 2019-12-23 NOTE — Telephone Encounter (Signed)
I spoke to [place] with [facility] and she reports that they currently are up to date on all orders, nothing further needed

## 2020-02-01 ENCOUNTER — Encounter: Payer: Self-pay | Admitting: Internal Medicine

## 2020-02-02 NOTE — Telephone Encounter (Signed)
PT with [facility] (did not leave name of individual calling) left v/m requesting 3 in 1 commode rx faxed to [facility];Per [facility] PT the fax # before was [phone] and [place] phone # is [phone] I called [place] to confirm fax # and the recording said wait time was 24 mins. Disconnected the call and sent note to [facility] CMA.

## 2020-02-02 NOTE — Telephone Encounter (Signed)
RX placed in MYD box

## 2020-02-08 ENCOUNTER — Ambulatory Visit (INDEPENDENT_AMBULATORY_CARE_PROVIDER_SITE_OTHER): Payer: 59 | Admitting: Neurology

## 2020-02-08 ENCOUNTER — Other Ambulatory Visit: Payer: Self-pay

## 2020-02-08 ENCOUNTER — Encounter: Payer: Self-pay | Admitting: Neurology

## 2020-02-08 VITALS — BP 120/67 | HR 66 | Ht 70.0 in | Wt 146.8 lb

## 2020-02-08 DIAGNOSIS — F0150 Vascular dementia without behavioral disturbance: Secondary | ICD-10-CM

## 2020-02-08 NOTE — Patient Instructions (Signed)
I had a long discussion the patient and his daughter-in-law regarding his mild vascular dementia and remote stroke and recommend he continue Aricept 10 mg daily as well as Namenda 10 mg twice daily.  I also advised him to continue participation in cognitively challenging activities like solving crossword puzzles, playing bridge and sodoku.  He was advised to continue aspirin for stroke prevention and maintain aggressive risk factor modification with strict control of hypertension with blood pressure goal below 130/90 and lipids with LDL cholesterol goal below 70 mg percent.  He will return for follow-up in the future in 6 months or call earlier if necessary.

## 2020-02-08 NOTE — Progress Notes (Signed)
[facility] [address] [place]. [place] [address] [phone]       OFFICE FOLLOW UP VISIT NOTE  Christopher. Christopher Murphy Date of Birth:  02/16/1946 Medical Record Number:  [id number]   Referring MD: Webb Silversmith, NP Reason for Referral: TIA HPI: Initial visit 03/11/2018: Christopher Murphy is a pleasant 74 year old [place] Sikh male who seen today for initial office consultation visit.  He is accompanied by his son and daughter-in-law who provide most of the history.  I have reviewed imaging films in PACS.  The patient has had recurrent transient episodes of speech difficulties for the last 10 years.  The episodes are stereotypical.  The patient struggles to speak at times and get words out.  At times his speech is gibberish and nonsensical.  These typically last only few minutes around 5-10 or so but he has had a couple episodes which lasted for an hour or longer.  During these prolonged episodes he is been found to be confused and disoriented.  Interestingly patient had a similar episode on 04/05/2016 when he was admitted to [facility].  He had MRI scan of the brain at that time which I personally reviewed and shows interesting diffuse hyperintensity throughout the cerebral white matter raising concern for seizures.  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  EEG was apparently not done.  Patient was not started on seizure medications.  Patient has history of left posterior cerebral artery infarct in 2009 in [place].  He was placed on a combination tablet of aspirin and Plavix as well as medication for lipids.  He had cognitive impairment and mild memory loss and was started on Namenda as well.  He is presently on Namenda 5 mg twice daily.  Family states that he is cognitively stable and does have short-term memory difficulties but long-term memory is good.  He is living  at home with family.  He needs only mild help with activities of daily living.  He can ambulate independently.  He has not had any witnessed generalized tonic-clonic seizure significant head injury or loss of consciousness.  The patient actually had gone to [place] following this episode in December 2017 for nearly 3 years before he came back.  He was seen by neurologist there but details are not known.  He was never placed on seizure medications. Update 08/27/2019 : He returns for follow-up after last visit a year and half ago.  Is accompanied by his son.  The patient continues to have mild memory and cognitive difficulties which appear to be unchanged and not particularly progressive.  He remains on Namenda 5 mg twice daily which is tolerating well.  He has never tried a higher dose.  Patient is not had many episodes of transient speech difficulties since her last visit.  He did undergo MRI scan of the brain on 03/31/2018 which showed old left PCA as well as few remote age lacunar infarcts and changes of small vessel disease.  No acute abnormalities.  MRA of the brain and neck both did not show any large vessel stenosis or occlusion.  EEG on 03/25/2018 was normal.  Patient had a colonoscopy done on 04/30/2019 and had a 3 mm polyp excised.  He has been having some swallowing difficulties of late and saw gastroenterologist in [place].  Modified barium swallow was done by speech therapy there but I do not have the report but the  patient's son was informed that it was fine.  He has no new complaints. Update 11/17/2019 : He returns for follow-up after last visit 3 months ago.  Is accompanied by his daughter-in-law.  They state that he had a episode of increasing confusion and hallucination as well as delusion lasting 3 days following a bladder infection and treatment with antibiotics.  He was admitted to the hospital and UA showed mild microscopic hematuria and he had recent UTI treatment.  Chest x-ray was  unremarkable.  Chest CT was also done which showed no significant abnormalities.  MRI scan of the brain showing progressive cerebral atrophy and old left PCA infarct but no acute abnormality.  2D echo showed normal ejection fraction.  Carotid ultrasound showed no significant extracranial stenosis.  His confusion improved over 3 days and he has done very well for the last week or so now and is at home.  Is back to his baseline.  He has a renal ultrasound pending for his hematuria.  He remains on aspirin and Plavix which is tolerating well without any external bleeding or bruising.  Mini-Mental status exam today scored 18/30 with deficits in orientation and recall.  He was able to copy intersecting pentagons but difficulty naming animals.  He has not been tried on Aricept so far.  He has not had recurrent TIA or stroke symptoms.  He has been taking Namenda 10 mg twice daily now for several months but family has not noticed any benefit.  Patient is currently not having any delusions, hallucinations, agitation or unsafe behavior.  He can walk independently but his gait has become more stooped but he has not had falls or injuries. Update 02/08/2020: He returns for follow-up after last visit to enough months ago.  Is accompanied by his daughter-in-law.  Patient has not had any further episodes of confusion hallucinations or delusions.  He is tolerating Aricept 10 mg daily well but feels appetite may have gone down.  He is however not lost weight.  He is cognitively unchanged and there has been no specific improvement noted.  Is tolerating both Namenda and Aricept fairly well.  Has had no recurrent stroke or TIA symptoms.  He remains on aspirin and Crestor both of which is tolerating well.  Blood pressures well controlled today it is 120/67.  He has no new complaints.  ROS:   14 system review of systems is positive for speech difficulties, memory loss, decreased appetite, confusion and all other systems negative  PMH:   Past Medical History:  Diagnosis Date  . Blood clots in brain 2009  . Stroke [facility])     Social History:  Social History   Socioeconomic History  . Marital status: Married    Spouse name: Not on file  . Number of children: Not on file  . Years of education: Not on file  . Highest education level: Not on file  Occupational History  . Not on file  Tobacco Use  . Smoking status: Never Smoker  . Smokeless tobacco: Never Used  Vaping Use  . Vaping Use: Never used  Substance and Sexual Activity  . Alcohol use: No  . Drug use: No  . Sexual activity: Not on file  Other Topics Concern  . Not on file  Social History Narrative   Lives with wife, son and daughter in law + 2 children   Right Handed   Drinks no caffeine   Social Determinants of Health   Financial Resource Strain:   . Difficulty  of Paying Living Expenses: Not on file  Food Insecurity:   . Worried About Charity fundraiser in the Last Year: Not on file  . Ran Out of Food in the Last Year: Not on file  Transportation Needs:   . Lack of Transportation (Medical): Not on file  . Lack of Transportation (Non-Medical): Not on file  Physical Activity:   . Days of Exercise per Week: Not on file  . Minutes of Exercise per Session: Not on file  Stress:   . Feeling of Stress : Not on file  Social Connections:   . Frequency of Communication with Friends and Family: Not on file  . Frequency of Social Gatherings with Friends and Family: Not on file  . Attends Religious Services: Not on file  . Active Member of Clubs or Organizations: Not on file  . Attends Archivist Meetings: Not on file  . Marital Status: Not on file  Intimate Partner Violence:   . Fear of Current or Ex-Partner: Not on file  . Emotionally Abused: Not on file  . Physically Abused: Not on file  . Sexually Abused: Not on file    Medications:   Current Outpatient Medications on File Prior to Visit  Medication Sig Dispense Refill  . aspirin  EC 81 MG tablet Take 81 mg by mouth daily at 6 (six) AM.    . cetirizine (ZYRTEC) 10 MG tablet Take 1 tablet (10 mg total) by mouth daily. 30 tablet 11  . donepezil (ARICEPT) 10 MG tablet Take 1 tablet (10 mg total) by mouth at bedtime. Start 1/2 tablet daily x 1 month and then 1 tablet daily 30 tablet 3  . famotidine (PEPCID) 20 MG tablet One after supper (Patient taking differently: Take 20 mg by mouth daily. One after supper) 30 tablet 11  . fluticasone (FLONASE) 50 MCG/ACT nasal spray Place 1-2 sprays into both nostrils daily for 7 days. 1 g 0  . folic acid (FOLVITE) 1 MG tablet Take 1 mg by mouth daily.    Marland Kitchen gabapentin (NEURONTIN) 300 MG capsule Take 300 mg by mouth 2 (two) times daily.    . memantine (NAMENDA) 10 MG tablet Take 1 tablet (10 mg total) by mouth 2 (two) times daily. 180 tablet 1  . pantoprazole (PROTONIX) 40 MG tablet TAKE 1 TABLET (40 MG TOTAL) BY MOUTH DAILY. TAKE 30-60 MIN BEFORE FIRST MEAL OF THE DAY 90 tablet 3  . Polyethyl Glycol-Propyl Glycol (SYSTANE) 0.4-0.3 % SOLN Apply 1 drop to eye 2 (two) times daily. 30 mL 5  . rosuvastatin (CRESTOR) 20 MG tablet Take 1 tablet (20 mg total) by mouth daily. 90 tablet 3   No current facility-administered medications on file prior to visit.    Allergies:  No Known Allergies  Physical Exam General: Frail elderly [place] Sikh male, seated, in no evident distress Head: head normocephalic and atraumatic.   Neck: supple with no carotid or supraclavicular bruits Cardiovascular: regular rate and rhythm, soft ejection systolic murmur. Musculoskeletal: no deformity Skin:  no rash/petichiae Vascular:  Normal pulses all extremities  Neurologic Exam Mental Status: Awake and fully alert. Oriented to place and time. Recent and remote memory diminished. Attention span, concentration and fund of knowledge slightly reduced.  Mini-Mental status exam not done.  Diminished recall 1/3.  Mood and affect appropriate.  Able to name only 5  animals which can walk on 4 legs.  Able to copy intersecting pentagons with some difficulty..  Clock drawing 4/4  Cranial Nerves: Fundoscopic exam reveals sharp disc margins. Pupils equal, briskly reactive to light. Extraocular movements full without nystagmus mild mechanical ptosis of the left eye.. 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 Hearing intact. Facial sensation intact. Face, tongue, palate moves normally and symmetrically.  Motor: Normal bulk and tone. Normal strength in all tested extremity muscles.  Diminished fine finger movements on the right.  Orbits left over right upper extremity. Sensory.: intact to touch , pinprick , position and vibratory sensation.  Coordination: Rapid alternating movements normal in all extremities. Finger-to-nose and heel-to-shin performed accurately bilaterally. Gait and Station: Arises from chair without difficulty. Stance is slightly stooped l. Gait demonstrates some festination and initiation apraxia and short steps..  Drags right leg slightly while walking.   Reflexes: 1+ and symmetric. Toes downgoing.       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  Vascular risk factors of hyperlipidemia, age and cerebrovascular disease.     PLAN: I had a long discussion the patient and his daughter-in-law regarding his mild vascular dementia and remote stroke and recommend he continue Aricept 10 mg daily as well as Namenda 10 mg twice daily.  I also advised him to continue participation in cognitively challenging activities like solving crossword puzzles, playing bridge and sodoku.  He was advised to continue aspirin for  stroke prevention and maintain aggressive risk factor modification with strict control of hypertension with blood pressure goal below 130/90 and lipids with LDL cholesterol goal below 70 mg percent.  He will return for follow-up in the future in 6 months or call earlier if necessary.Greater than 50% time during this 25-minute visit was spent on counseling and coordination of care about his episode of confusion, remote TIA and mild dementia and answering questions. Antony Contras, MD  [facility] [address] [place], [place] [address]  Phone [phone] Fax [phone] Note: This document was prepared with digital dictation and possible smart phrase technology. Any transcriptional errors that result from this process are unintentional.

## 2020-02-18 NOTE — Telephone Encounter (Signed)
PT called back because they saw pt today and he advised them he still didn't have a bedside commode. I advise him of Melanie's mychart message regarding not being able to get fax # from [place]. Doesn't look like pt or family viewed last message [facility] sent. PT is asking if we can fax order to another supplier like [employer] since pt is still needing this. Also he ask for [place] to f/u with pt/famiy regarding this order

## 2020-02-25 ENCOUNTER — Encounter: Payer: Self-pay | Admitting: Internal Medicine

## 2020-02-25 ENCOUNTER — Ambulatory Visit (INDEPENDENT_AMBULATORY_CARE_PROVIDER_SITE_OTHER): Payer: 59 | Admitting: Internal Medicine

## 2020-02-25 ENCOUNTER — Other Ambulatory Visit: Payer: Self-pay

## 2020-02-25 VITALS — BP 118/64 | HR 90 | Temp 98.0°F | Wt 146.0 lb

## 2020-02-25 DIAGNOSIS — Z23 Encounter for immunization: Secondary | ICD-10-CM | POA: Diagnosis not present

## 2020-02-25 DIAGNOSIS — R059 Cough, unspecified: Secondary | ICD-10-CM

## 2020-02-25 DIAGNOSIS — L219 Seborrheic dermatitis, unspecified: Secondary | ICD-10-CM | POA: Diagnosis not present

## 2020-02-25 MED ORDER — TRIAMCINOLONE ACETONIDE 0.1 % EX CREA
1.0000 | TOPICAL_CREAM | Freq: Two times a day (BID) | CUTANEOUS | 0 refills | Status: DC
Start: 2020-02-25 — End: 2020-03-14

## 2020-02-25 NOTE — Patient Instructions (Signed)
Seborrheic Dermatitis, Adult Seborrheic dermatitis is a skin disease that causes red, scaly patches. It usually occurs on the scalp, and it is often called dandruff. The patches may appear on other parts of the body. Skin patches tend to appear where there are many oil glands in the skin. Areas of the body that are commonly affected include:  Scalp.  Skin folds of the body.  Ears.  Eyebrows.  Neck.  Face.  Armpits.  The bearded area of men's faces. The condition may come and go for no known reason, and it is often long-lasting (chronic). What are the causes? The cause of this condition is not known. What increases the risk? This condition is more likely to develop in people who:  Have certain conditions, such as: ? HIV (human immunodeficiency virus). ? AIDS (acquired immunodeficiency syndrome). ? Parkinson disease. ? Mood disorders, such as depression.  Are 40-60 years old. What are the signs or symptoms? Symptoms of this condition include:  Thick scales on the scalp.  Redness on the face or in the armpits.  Skin that is flaky. The flakes may be white or yellow.  Skin that seems oily or dry but is not helped with moisturizers.  Itching or burning in the affected areas. How is this diagnosed? This condition is diagnosed with a medical history and physical exam. A sample of your skin may be tested (skin biopsy). You may need to see a skin specialist (dermatologist). How is this treated? There is no cure for this condition, but treatment can help to manage the symptoms. You may get treatment to remove scales, lower the risk of skin infection, and reduce swelling or itching. Treatment may include:  Creams that reduce swelling and irritation (steroids).  Creams that reduce skin yeast.  Medicated shampoo, soaps, moisturizing creams, or ointments.  Medicated moisturizing creams or ointments. Follow these instructions at home:  Apply over-the-counter and prescription  medicines only as told by your health care provider.  Use any medicated shampoo, soaps, skin creams, or ointments only as told by your health care provider.  Keep all follow-up visits as told by your health care provider. This is important. Contact a health care provider if:  Your symptoms do not improve with treatment.  Your symptoms get worse.  You have new symptoms. This information is not intended to replace advice given to you by your health care provider. Make sure you discuss any questions you have with your health care provider. Document Revised: 03/08/2017 Document Reviewed: 07/14/2015 Elsevier Patient Education  2020 [employer]  

## 2020-02-25 NOTE — Progress Notes (Signed)
Subjective:    Patient ID: Christopher Murphy, male    DOB: April 19, 1945, 74 y.o.   MRN: [id number]  HPI  Pt presents to the clinic today with c/o dry skin. His daughter in law reports dry skin behind his ears and in his beard. He denies changes in soaps, lotions or detergents. This does not itch. He has tried Coconut Oil and Nizoral Shampoo with minimal relief of symptoms.   He also reports cough and chest congestion. This started 3 days ago. The cough is productive of clear/yellow mucous. He denies headache, eye pain/redness/discharge, runny nose, nasal congestion, ear pain, loss of taste/smell, sore throat or SOB. He denies fever, chills or body aches. He has not taken anything OTC for this.   Review of Systems      Past Medical History:  Diagnosis Date  . Blood clots in brain 2009  . Stroke [facility]     Current Outpatient Medications  Medication Sig Dispense Refill  . aspirin EC 81 MG tablet Take 81 mg by mouth daily at 6 (six) AM.    . cetirizine (ZYRTEC) 10 MG tablet Take 1 tablet (10 mg total) by mouth daily. 30 tablet 11  . donepezil (ARICEPT) 10 MG tablet Take 1 tablet (10 mg total) by mouth at bedtime. Start 1/2 tablet daily x 1 month and then 1 tablet daily 30 tablet 3  . famotidine (PEPCID) 20 MG tablet One after supper (Patient taking differently: Take 20 mg by mouth daily. One after supper) 30 tablet 11  . fluticasone (FLONASE) 50 MCG/ACT nasal spray Place 1-2 sprays into both nostrils daily for 7 days. 1 g 0  . folic acid (FOLVITE) 1 MG tablet Take 1 mg by mouth daily.    Marland Kitchen gabapentin (NEURONTIN) 300 MG capsule Take 300 mg by mouth 2 (two) times daily.    . memantine (NAMENDA) 10 MG tablet Take 1 tablet (10 mg total) by mouth 2 (two) times daily. 180 tablet 1  . pantoprazole (PROTONIX) 40 MG tablet TAKE 1 TABLET (40 MG TOTAL) BY MOUTH DAILY. TAKE 30-60 MIN BEFORE FIRST MEAL OF THE DAY 90 tablet 3  . Polyethyl Glycol-Propyl Glycol (SYSTANE) 0.4-0.3 % SOLN Apply 1 drop to  eye 2 (two) times daily. 30 mL 5  . rosuvastatin (CRESTOR) 20 MG tablet Take 1 tablet (20 mg total) by mouth daily. 90 tablet 3   No current facility-administered medications for this visit.    No Known Allergies  Family History  Problem Relation Age of Onset  . AAA (abdominal aortic aneurysm) Neg Hx     Social History   Socioeconomic History  . Marital status: Married    Spouse name: Not on file  . Number of children: Not on file  . Years of education: Not on file  . Highest education level: Not on file  Occupational History  . Not on file  Tobacco Use  . Smoking status: Never Smoker  . Smokeless tobacco: Never Used  Vaping Use  . Vaping Use: Never used  Substance and Sexual Activity  . Alcohol use: No  . Drug use: No  . Sexual activity: Not on file  Other Topics Concern  . Not on file  Social History Narrative   Lives with wife, son and daughter in law + 2 children   Right Handed   Drinks no caffeine   Social Determinants of Health   Financial Resource Strain:   . Difficulty of Paying Living Expenses: Not on file  Food  Insecurity:   . Worried About Charity fundraiser in the Last Year: Not on file  . Ran Out of Food in the Last Year: Not on file  Transportation Needs:   . Lack of Transportation (Medical): Not on file  . Lack of Transportation (Non-Medical): Not on file  Physical Activity:   . Days of Exercise per Week: Not on file  . Minutes of Exercise per Session: Not on file  Stress:   . Feeling of Stress : Not on file  Social Connections:   . Frequency of Communication with Friends and Family: Not on file  . Frequency of Social Gatherings with Friends and Family: Not on file  . Attends Religious Services: Not on file  . Active Member of Clubs or Organizations: Not on file  . Attends Archivist Meetings: Not on file  . Marital Status: Not on file  Intimate Partner Violence:   . Fear of Current or Ex-Partner: Not on file  . Emotionally  Abused: Not on file  . Physically Abused: Not on file  . Sexually Abused: Not on file     Constitutional: Denies fever, malaise, fatigue, headache or abrupt weight changes.  HEENT: Denies eye pain, eye redness, ear pain, ringing in the ears, wax buildup, runny nose, nasal congestion, bloody nose, or sore throat. Respiratory: Pt reports cough. Denies difficulty breathing, shortness of breath.   Cardiovascular: Denies chest pain, chest tightness, palpitations or swelling in the hands or feet.  Skin: Pt reports dry skin. Denies redness, rashes, lesions or ulcercations.   No other specific complaints in a complete review of systems (except as listed in HPI above).  Objective:   Physical Exam  BP 118/64   Pulse 90   Temp 98 F (36.7 C) (Temporal)   Wt 146 lb (66.2 kg)   SpO2 96%   BMI 20.95 kg/m   Wt Readings from Last 3 Encounters:  02/08/20 146 lb 12.8 oz (66.6 kg)  12/08/19 149 lb 12.8 oz (67.9 kg)  11/17/19 150 lb (68 kg)    General: Appears his stated age, well developed, well nourished in NAD. Skin: Seborrheic dermatitis noted of face, behind ears. HEENT: Head: normal shape and size; Neck:  No adenopathy noted. Cardiovascular: Normal rate and rhythm. Pulmonary/Chest: Normal effort and positive vesicular breath sounds. No respiratory distress. No wheezes, rales or ronchi noted.  Neurological: Alert and oriented.   BMET    Component Value Date/Time   NA 135 11/05/2019 1232   K 4.2 11/05/2019 1232   CL 102 11/05/2019 1232   CO2 28 11/05/2019 1232   GLUCOSE 91 11/05/2019 1232   BUN 7 11/05/2019 1232   CREATININE 0.98 11/05/2019 1232   CREATININE 0.85 06/12/2019 1529   CALCIUM 9.0 11/05/2019 1232   GFRNONAA >60 10/28/2019 0905   GFRNONAA 83 02/21/2015 1008   GFRAA >60 10/28/2019 0905   GFRAA >89 02/21/2015 1008    Lipid Panel     Component Value Date/Time   CHOL 129 10/29/2019 0444   CHOL 107 08/27/2019 1544   TRIG 89 10/29/2019 0444   HDL 45 10/29/2019  0444   HDL 35 (L) 08/27/2019 1544   CHOLHDL 2.9 10/29/2019 0444   VLDL 18 10/29/2019 0444   LDLCALC 66 10/29/2019 0444   LDLCALC 45 08/27/2019 1544   LDLCALC 45 06/12/2019 1529    CBC    Component Value Date/Time   WBC 6.4 10/28/2019 0905   RBC 4.76 10/28/2019 0905   HGB 12.9 (L) 10/28/2019  0905   HCT 39.2 10/28/2019 0905   PLT 229 10/28/2019 0905   MCV 82.4 10/28/2019 0905   MCH 27.1 10/28/2019 0905   MCHC 32.9 10/28/2019 0905   RDW 13.7 10/28/2019 0905   LYMPHSABS 1.9 10/19/2019 1001   MONOABS 0.6 10/19/2019 1001   EOSABS 0.2 10/19/2019 1001   BASOSABS 0.0 10/19/2019 1001    Hgb A1C Lab Results  Component Value Date   HGBA1C 5.3 10/29/2019          Assessment & Plan:   Seborrheic Dermatitis:  RX for Triamcinolone 0.1% cream BID- mix with Nizoral Can refer to dermatology if symptoms persist or worsen  Cough:  Likely due to PND Start Zyrtec 10 mg daily x 7-10 days Delsym as needed for cough  Return precautions discussed Webb Silversmith, NP This visit occurred during the SARS-CoV-2 public health emergency.  Safety protocols were in place, including screening questions prior to the visit, additional usage of staff PPE, and extensive cleaning of exam room while observing appropriate contact time as indicated for disinfecting solutions.

## 2020-02-26 ENCOUNTER — Encounter: Payer: Self-pay | Admitting: Internal Medicine

## 2020-03-01 ENCOUNTER — Telehealth: Payer: Self-pay | Admitting: Internal Medicine

## 2020-03-01 MED ORDER — ALBUTEROL SULFATE HFA 108 (90 BASE) MCG/ACT IN AERS
INHALATION_SPRAY | RESPIRATORY_TRACT | 1 refills | Status: DC
Start: 2020-03-01 — End: 2020-05-13

## 2020-03-01 NOTE — Telephone Encounter (Signed)
There was no wheezing on exam. Would he like a RX for Albuterol inhaler, or does he have one he can use?

## 2020-03-01 NOTE — Telephone Encounter (Signed)
daugter called in wanted her father to be seen for the wheezing, going to urgent care to get checked out for the wheezing.

## 2020-03-01 NOTE — Telephone Encounter (Signed)
Patient's daugher called.Patient was seen by Rollene Fare last week for cough and congestion.  Patient has drainage, wheezing,coughing on and off. Her main concern is the wheezing.  He doesn't have a fever. Patient uses [facility].

## 2020-03-01 NOTE — Telephone Encounter (Signed)
Spoke to son and pt is not having difficulty breathing or fever per VO from [place] send in Albuterol inhaler for PRN, continue Flonase and Zyrtec x 3 days... UC/ER precautions given

## 2020-03-02 ENCOUNTER — Other Ambulatory Visit: Payer: Self-pay | Admitting: Neurology

## 2020-03-11 ENCOUNTER — Other Ambulatory Visit: Payer: Self-pay | Admitting: Internal Medicine

## 2020-03-11 ENCOUNTER — Other Ambulatory Visit: Payer: Self-pay | Admitting: Neurology

## 2020-05-07 ENCOUNTER — Telehealth: Payer: Medicaid Other | Admitting: Nurse Practitioner

## 2020-05-07 DIAGNOSIS — R059 Cough, unspecified: Secondary | ICD-10-CM | POA: Diagnosis not present

## 2020-05-07 MED ORDER — BENZONATATE 100 MG PO CAPS: 100.0000 mg | ORAL_CAPSULE | Freq: Three times a day (TID) | ORAL | 0 refills | Status: DC | PRN

## 2020-05-07 MED ORDER — AZITHROMYCIN 250 MG PO TABS
ORAL_TABLET | ORAL | 0 refills | Status: DC
Start: 2020-05-07 — End: 2020-05-13

## 2020-05-07 NOTE — Progress Notes (Signed)
We are sorry that you are not feeling well.  Here is how we plan to help!  Based on your presentation I believe you most likely have A cough due to bacteria.  When patients have a fever and a productive cough with a change in color or increased sputum production, we are concerned about bacterial bronchitis.  If left untreated it can progress to pneumonia.  If your symptoms do not improve with your treatment plan it is important that you contact your provider.   I have prescribed Azithromyin 250 mg: two tablets now and then one tablet daily for 4 additonal days    In addition you may use A prescription cough medication called Tessalon Perles 100mg. You may take 1-2 capsules every 8 hours as needed for your cough.   From your responses in the eVisit questionnaire you describe inflammation in the upper respiratory tract which is causing a significant cough.  This is commonly called Bronchitis and has four common causes:    Allergies  Viral Infections  Acid Reflux  Bacterial Infection Allergies, viruses and acid reflux are treated by controlling symptoms or eliminating the cause. An example might be a cough caused by taking certain blood pressure medications. You stop the cough by changing the medication. Another example might be a cough caused by acid reflux. Controlling the reflux helps control the cough.  USE OF BRONCHODILATOR ("RESCUE") INHALERS: There is a risk from using your bronchodilator too frequently.  The risk is that over-reliance on a medication which only relaxes the muscles surrounding the breathing tubes can reduce the effectiveness of medications prescribed to reduce swelling and congestion of the tubes themselves.  Although you feel brief relief from the bronchodilator inhaler, your asthma may actually be worsening with the tubes becoming more swollen and filled with mucus.  This can delay other crucial treatments, such as oral steroid medications. If you need to use a  bronchodilator inhaler daily, several times per day, you should discuss this with your provider.  There are probably better treatments that could be used to keep your asthma under control.     HOME CARE . Only take medications as instructed by your medical team. . Complete the entire course of an antibiotic. . Drink plenty of fluids and get plenty of rest. . Avoid close contacts especially the very young and the elderly . Cover your mouth if you cough or cough into your sleeve. . Always remember to wash your hands . A steam or ultrasonic humidifier can help congestion.   GET HELP RIGHT AWAY IF: . You develop worsening fever. . You become short of breath . You cough up blood. . Your symptoms persist after you have completed your treatment plan MAKE SURE YOU   Understand these instructions.  Will watch your condition.  Will get help right away if you are not doing well or get worse.  Your e-visit answers were reviewed by a board certified advanced clinical practitioner to complete your personal care plan.  Depending on the condition, your plan could have included both over the counter or prescription medications. If there is a problem please reply  once you have received a response from your provider. Your safety is important to us.  If you have drug allergies check your prescription carefully.    You can use MyChart to ask questions about today's visit, request a non-urgent call back, or ask for a work or school excuse for 24 hours related to this e-Visit. If it has been   greater than 24 hours you will need to follow up with your provider, or enter a new e-Visit to address those concerns. You will get an e-mail in the next two days asking about your experience.  I hope that your e-visit has been valuable and will speed your recovery. Thank you for using e-visits.  5-10 minutes spent reviewing and documenting in chart.  

## 2020-05-09 ENCOUNTER — Emergency Department (HOSPITAL_COMMUNITY): Payer: Medicaid Other

## 2020-05-09 ENCOUNTER — Encounter (HOSPITAL_COMMUNITY): Payer: Self-pay

## 2020-05-09 ENCOUNTER — Inpatient Hospital Stay (HOSPITAL_COMMUNITY): Payer: Medicaid Other

## 2020-05-09 ENCOUNTER — Encounter: Payer: Self-pay | Admitting: Internal Medicine

## 2020-05-09 ENCOUNTER — Inpatient Hospital Stay (HOSPITAL_COMMUNITY)
Admission: EM | Admit: 2020-05-09 | Discharge: 2020-05-13 | DRG: 177 | Disposition: A | Discharge status: Home Health | Payer: Medicaid Other | Attending: Internal Medicine | Admitting: Internal Medicine

## 2020-05-09 ENCOUNTER — Other Ambulatory Visit: Payer: Self-pay

## 2020-05-09 DIAGNOSIS — I712 Thoracic aortic aneurysm, without rupture: Secondary | ICD-10-CM | POA: Diagnosis present

## 2020-05-09 DIAGNOSIS — R509 Fever, unspecified: Secondary | ICD-10-CM | POA: Insufficient documentation

## 2020-05-09 DIAGNOSIS — R590 Localized enlarged lymph nodes: Secondary | ICD-10-CM | POA: Diagnosis present

## 2020-05-09 DIAGNOSIS — J9600 Acute respiratory failure, unspecified whether with hypoxia or hypercapnia: Secondary | ICD-10-CM

## 2020-05-09 DIAGNOSIS — Z7982 Long term (current) use of aspirin: Secondary | ICD-10-CM | POA: Diagnosis not present

## 2020-05-09 DIAGNOSIS — U071 COVID-19: Secondary | ICD-10-CM | POA: Diagnosis not present

## 2020-05-09 DIAGNOSIS — R339 Retention of urine, unspecified: Secondary | ICD-10-CM | POA: Diagnosis present

## 2020-05-09 DIAGNOSIS — F0390 Unspecified dementia without behavioral disturbance: Secondary | ICD-10-CM | POA: Diagnosis present

## 2020-05-09 DIAGNOSIS — I69351 Hemiplegia and hemiparesis following cerebral infarction affecting right dominant side: Secondary | ICD-10-CM | POA: Diagnosis not present

## 2020-05-09 DIAGNOSIS — K219 Gastro-esophageal reflux disease without esophagitis: Secondary | ICD-10-CM | POA: Diagnosis present

## 2020-05-09 DIAGNOSIS — E785 Hyperlipidemia, unspecified: Secondary | ICD-10-CM | POA: Diagnosis present

## 2020-05-09 DIAGNOSIS — Z79899 Other long term (current) drug therapy: Secondary | ICD-10-CM

## 2020-05-09 DIAGNOSIS — E876 Hypokalemia: Secondary | ICD-10-CM | POA: Diagnosis present

## 2020-05-09 DIAGNOSIS — R4182 Altered mental status, unspecified: Secondary | ICD-10-CM | POA: Diagnosis not present

## 2020-05-09 DIAGNOSIS — J1282 Pneumonia due to coronavirus disease 2019: Secondary | ICD-10-CM | POA: Diagnosis present

## 2020-05-09 DIAGNOSIS — G309 Alzheimer's disease, unspecified: Secondary | ICD-10-CM | POA: Diagnosis not present

## 2020-05-09 DIAGNOSIS — G9341 Metabolic encephalopathy: Secondary | ICD-10-CM | POA: Diagnosis present

## 2020-05-09 DIAGNOSIS — J690 Pneumonitis due to inhalation of food and vomit: Secondary | ICD-10-CM | POA: Diagnosis present

## 2020-05-09 DIAGNOSIS — R7989 Other specified abnormal findings of blood chemistry: Secondary | ICD-10-CM | POA: Diagnosis present

## 2020-05-09 DIAGNOSIS — J9601 Acute respiratory failure with hypoxia: Secondary | ICD-10-CM | POA: Diagnosis present

## 2020-05-09 DIAGNOSIS — R053 Chronic cough: Secondary | ICD-10-CM

## 2020-05-09 DIAGNOSIS — F0280 Dementia in other diseases classified elsewhere without behavioral disturbance: Secondary | ICD-10-CM | POA: Diagnosis not present

## 2020-05-09 LAB — C-REACTIVE PROTEIN: CRP: 15.7 mg/dL — ABNORMAL HIGH (ref <1.0)

## 2020-05-09 LAB — CBC WITH DIFFERENTIAL/PLATELET
Abs Immature Granulocytes: 0.11 10*3/uL — ABNORMAL HIGH (ref 0.00–0.07)
Basophils Absolute: 0 10*3/uL (ref 0.0–0.1)
Basophils Relative: 0 %
Eosinophils Absolute: 0 10*3/uL (ref 0.0–0.5)
Eosinophils Relative: 0 %
HCT: 38.8 % — ABNORMAL LOW (ref 39.0–52.0)
Hemoglobin: 12.9 g/dL — ABNORMAL LOW (ref 13.0–17.0)
Immature Granulocytes: 1 %
Lymphocytes Relative: 7 %
Lymphs Abs: 0.6 10*3/uL — ABNORMAL LOW (ref 0.7–4.0)
MCH: 26.5 pg (ref 26.0–34.0)
MCHC: 33.2 g/dL (ref 30.0–36.0)
MCV: 79.7 fL — ABNORMAL LOW (ref 80.0–100.0)
Monocytes Absolute: 0.5 10*3/uL (ref 0.1–1.0)
Monocytes Relative: 5 %
Neutro Abs: 7.6 10*3/uL (ref 1.7–7.7)
Neutrophils Relative %: 87 %
Platelets: 388 10*3/uL (ref 150–400)
RBC: 4.87 MIL/uL (ref 4.22–5.81)
RDW: 13 % (ref 11.5–15.5)
WBC: 8.9 10*3/uL (ref 4.0–10.5)
nRBC: 0 % (ref 0.0–0.2)

## 2020-05-09 LAB — LACTIC ACID, PLASMA: Lactic Acid, Venous: 1.4 mmol/L (ref 0.5–1.9)

## 2020-05-09 LAB — COMPREHENSIVE METABOLIC PANEL
ALT: 42 U/L (ref 0–44)
AST: 36 U/L (ref 15–41)
Albumin: 2.6 g/dL — ABNORMAL LOW (ref 3.5–5.0)
Alkaline Phosphatase: 103 U/L (ref 38–126)
Anion gap: 12 (ref 5–15)
BUN: 7 mg/dL — ABNORMAL LOW (ref 8–23)
CO2: 25 mmol/L (ref 22–32)
Calcium: 8.6 mg/dL — ABNORMAL LOW (ref 8.9–10.3)
Chloride: 99 mmol/L (ref 98–111)
Creatinine, Ser: 0.84 mg/dL (ref 0.61–1.24)
GFR, Estimated: >60 mL/min (ref >60)
Glucose, Bld: 106 mg/dL — ABNORMAL HIGH (ref 70–99)
Potassium: 3.1 mmol/L — ABNORMAL LOW (ref 3.5–5.1)
Sodium: 136 mmol/L (ref 135–145)
Total Bilirubin: 0.8 mg/dL (ref 0.3–1.2)
Total Protein: 7 g/dL (ref 6.5–8.1)

## 2020-05-09 LAB — TRIGLYCERIDES: Triglycerides: 76 mg/dL (ref <150)

## 2020-05-09 LAB — MAGNESIUM: Magnesium: 1.9 mg/dL (ref 1.7–2.4)

## 2020-05-09 LAB — PROTIME-INR
INR: 1.2 (ref 0.8–1.2)
Prothrombin Time: 15.1 seconds (ref 11.4–15.2)

## 2020-05-09 LAB — D-DIMER, QUANTITATIVE: D-Dimer, Quant: 0.85 ug/mL-FEU — ABNORMAL HIGH (ref 0.00–0.50)

## 2020-05-09 LAB — PROCALCITONIN: Procalcitonin: 0.13 ng/mL

## 2020-05-09 LAB — APTT: aPTT: 36 seconds (ref 24–36)

## 2020-05-09 LAB — POC SARS CORONAVIRUS 2 AG -  ED
SARS Coronavirus 2 Ag: POSITIVE — AB
SARS Coronavirus 2 Ag: POSITIVE — AB

## 2020-05-09 LAB — FIBRINOGEN: Fibrinogen: >800 mg/dL — ABNORMAL HIGH (ref 210–475)

## 2020-05-09 LAB — FERRITIN: Ferritin: 362 ng/mL — ABNORMAL HIGH (ref 24–336)

## 2020-05-09 LAB — LACTATE DEHYDROGENASE: LDH: 137 U/L (ref 98–192)

## 2020-05-09 MED ORDER — IPRATROPIUM-ALBUTEROL 20-100 MCG/ACT IN AERS
1.0000 | INHALATION_SPRAY | Freq: Four times a day (QID) | RESPIRATORY_TRACT | Status: DC
  Administered 2020-05-10 – 2020-05-13 (×14): 1 via RESPIRATORY_TRACT
  Filled 2020-05-09: qty 4

## 2020-05-09 MED ORDER — DONEPEZIL HCL 10 MG PO TABS
10.0000 mg | ORAL_TABLET | Freq: Every day | ORAL | Status: DC
  Administered 2020-05-10 – 2020-05-12 (×3): 10 mg via ORAL
  Filled 2020-05-09 (×4): qty 1

## 2020-05-09 MED ORDER — ACETAMINOPHEN 650 MG RE SUPP
650.0000 mg | Freq: Once | RECTAL | Status: AC
  Administered 2020-05-09: 650 mg via RECTAL
  Filled 2020-05-09: qty 1

## 2020-05-09 MED ORDER — POTASSIUM CHLORIDE CRYS ER 20 MEQ PO TBCR: 40.0000 meq | EXTENDED_RELEASE_TABLET | Freq: Once | ORAL | Status: DC

## 2020-05-09 MED ORDER — ONDANSETRON HCL 4 MG PO TABS: 4.0000 mg | ORAL_TABLET | Freq: Four times a day (QID) | ORAL | Status: DC | PRN

## 2020-05-09 MED ORDER — BENZONATATE 100 MG PO CAPS
100.0000 mg | ORAL_CAPSULE | Freq: Three times a day (TID) | ORAL | Status: DC | PRN
  Administered 2020-05-12: 100 mg via ORAL
  Filled 2020-05-09: qty 1

## 2020-05-09 MED ORDER — ACETAMINOPHEN 325 MG PO TABS: 650.0000 mg | ORAL_TABLET | Freq: Four times a day (QID) | ORAL | Status: DC | PRN

## 2020-05-09 MED ORDER — ROSUVASTATIN CALCIUM 20 MG PO TABS
20.0000 mg | ORAL_TABLET | Freq: Every day | ORAL | Status: DC
  Administered 2020-05-10 – 2020-05-13 (×4): 20 mg via ORAL
  Filled 2020-05-09 (×4): qty 1

## 2020-05-09 MED ORDER — MEMANTINE HCL 10 MG PO TABS
10.0000 mg | ORAL_TABLET | Freq: Two times a day (BID) | ORAL | Status: DC
  Administered 2020-05-10 – 2020-05-13 (×7): 10 mg via ORAL
  Filled 2020-05-09 (×8): qty 1

## 2020-05-09 MED ORDER — PREDNISONE 5 MG PO TABS: 50.0000 mg | ORAL_TABLET | Freq: Every day | ORAL | Status: DC

## 2020-05-09 MED ORDER — METHYLPREDNISOLONE SODIUM SUCC 40 MG IJ SOLR
0.5000 mg/kg | Freq: Two times a day (BID) | INTRAMUSCULAR | Status: DC
  Administered 2020-05-09 – 2020-05-10 (×2): 33.2 mg via INTRAVENOUS
  Filled 2020-05-09 (×2): qty 1

## 2020-05-09 MED ORDER — LORATADINE 10 MG PO TABS
10.0000 mg | ORAL_TABLET | Freq: Every day | ORAL | Status: DC
  Administered 2020-05-10 – 2020-05-13 (×4): 10 mg via ORAL
  Filled 2020-05-09 (×4): qty 1

## 2020-05-09 MED ORDER — SODIUM CHLORIDE 0.9 % IV SOLN
3.0000 g | Freq: Four times a day (QID) | INTRAVENOUS | Status: DC
  Administered 2020-05-09 – 2020-05-13 (×16): 3 g via INTRAVENOUS
  Filled 2020-05-09: qty 3
  Filled 2020-05-09: qty 8
  Filled 2020-05-09: qty 3
  Filled 2020-05-09: qty 8
  Filled 2020-05-09 (×2): qty 3
  Filled 2020-05-09: qty 8
  Filled 2020-05-09 (×2): qty 3
  Filled 2020-05-09 (×2): qty 8
  Filled 2020-05-09 (×4): qty 3
  Filled 2020-05-09 (×2): qty 8
  Filled 2020-05-09: qty 3
  Filled 2020-05-09: qty 8

## 2020-05-09 MED ORDER — SODIUM CHLORIDE 0.9 % IV SOLN: Freq: Once | INTRAVENOUS | Status: AC

## 2020-05-09 MED ORDER — SODIUM CHLORIDE 0.9 % IV SOLN
100.0000 mg | Freq: Every day | INTRAVENOUS | Status: AC
  Administered 2020-05-10 – 2020-05-13 (×4): 100 mg via INTRAVENOUS
  Filled 2020-05-09 (×4): qty 20

## 2020-05-09 MED ORDER — POLYVINYL ALCOHOL 1.4 % OP SOLN
1.0000 [drp] | Freq: Two times a day (BID) | OPHTHALMIC | Status: DC
  Administered 2020-05-09 – 2020-05-13 (×8): 1 [drp] via OPHTHALMIC
  Filled 2020-05-09 (×2): qty 15

## 2020-05-09 MED ORDER — ENOXAPARIN SODIUM 40 MG/0.4ML ~~LOC~~ SOLN
40.0000 mg | SUBCUTANEOUS | Status: DC
  Administered 2020-05-10 – 2020-05-12 (×3): 40 mg via SUBCUTANEOUS
  Filled 2020-05-09 (×3): qty 0.4

## 2020-05-09 MED ORDER — FOLIC ACID 1 MG PO TABS
1.0000 mg | ORAL_TABLET | Freq: Every day | ORAL | Status: DC
  Administered 2020-05-10 – 2020-05-13 (×4): 1 mg via ORAL
  Filled 2020-05-09 (×4): qty 1

## 2020-05-09 MED ORDER — PANTOPRAZOLE SODIUM 40 MG PO TBEC
40.0000 mg | DELAYED_RELEASE_TABLET | Freq: Every day | ORAL | Status: DC
  Administered 2020-05-10 – 2020-05-13 (×4): 40 mg via ORAL
  Filled 2020-05-09 (×4): qty 1

## 2020-05-09 MED ORDER — SENNOSIDES-DOCUSATE SODIUM 8.6-50 MG PO TABS: 1.0000 | ORAL_TABLET | Freq: Every evening | ORAL | Status: DC | PRN

## 2020-05-09 MED ORDER — SODIUM CHLORIDE 0.9 % IV SOLN
200.0000 mg | Freq: Once | INTRAVENOUS | Status: AC
  Administered 2020-05-09: 200 mg via INTRAVENOUS
  Filled 2020-05-09: qty 200

## 2020-05-09 MED ORDER — SODIUM CHLORIDE 0.9 % IV SOLN: INTRAVENOUS | Status: AC

## 2020-05-09 MED ORDER — ONDANSETRON HCL 4 MG/2ML IJ SOLN: 4.0000 mg | Freq: Four times a day (QID) | INTRAMUSCULAR | Status: DC | PRN

## 2020-05-09 MED ORDER — FLUTICASONE PROPIONATE 50 MCG/ACT NA SUSP
1.0000 | Freq: Every day | NASAL | Status: DC
  Administered 2020-05-10: 2 via NASAL
  Administered 2020-05-11 – 2020-05-12 (×2): 1 via NASAL
  Administered 2020-05-13: 2 via NASAL
  Filled 2020-05-09: qty 16

## 2020-05-09 MED ORDER — ASPIRIN EC 81 MG PO TBEC
81.0000 mg | DELAYED_RELEASE_TABLET | Freq: Every day | ORAL | Status: DC
  Administered 2020-05-11 – 2020-05-12 (×2): 81 mg via ORAL
  Filled 2020-05-09 (×4): qty 1

## 2020-05-09 NOTE — Progress Notes (Signed)
Pharmacy Antibiotic Note  Christopher Murphy is a 75 y.o. male admitted on 05/09/2020 with COVID pna and possible aspiration.  Pharmacy has been consulted for Unasyn dosing.  Plan: Unasyn 3g IV every 6 hours Monitor renal function, clinical progression and LOT  Height: 5\' 10"  (177.8 cm) Weight: 66.2 kg (145 lb 15.1 oz) IBW/kg (Calculated) : 73  Temp (24hrs), Avg:101.1 F (38.4 C), Min:100 F (37.8 C), Max:102.1 F (38.9 C)  Recent Labs  Lab 05/09/20 1232  WBC 8.9  CREATININE 0.84  LATICACIDVEN 1.4    Estimated Creatinine Clearance: 72.2 mL/min (by C-G formula based on SCr of 0.84 mg/dL).    No Known Allergies  Bertis Ruddy, PharmD Clinical Pharmacist ED Pharmacist Phone # [phone] 05/09/2020 4:52 PM

## 2020-05-09 NOTE — ED Provider Notes (Signed)
[facility] EMERGENCY DEPARTMENT Provider Note   CSN: [id number] Arrival date & time: 05/09/20  1122     History Chief Complaint  Patient presents with  . AMS  . Suspected COVID    Christopher Murphy is a 75 y.o. male.  Level 5 caveat due to altered mental status.  History obtained by family.  The history is provided by the EMS personnel and a caregiver.  Altered Mental Status Presenting symptoms: confusion and lethargy   Severity:  Moderate Most recent episode:  Today Episode history:  Single Timing:  Constant Progression:  Unchanged Chronicity:  New Context: recent illness (Suspect covid illness as family members with same, Less responsive today, fever and decreased PO intake. Possible some loose stools)        Past Medical History:  Diagnosis Date  . Blood clots in brain 2009  . Stroke [facility])     Patient Active Problem List   Diagnosis Date Noted  . Vascular dementia ([place]) 10/28/2019  . Chronic cough 10/19/2019  . TIA (transient ischemic attack) 04/05/2016    Past Surgical History:  Procedure Laterality Date  . COLONOSCOPY WITH PROPOFOL N/A 04/30/2019   Procedure: COLONOSCOPY WITH PROPOFOL;  Surgeon: Jonathon Bellows, MD;  Location: [facility] ENDOSCOPY;  Service: Gastroenterology;  Laterality: N/A;  . PROSTATECTOMY  1980       Family History  Problem Relation Age of Onset  . AAA (abdominal aortic aneurysm) Neg Hx     Social History   Tobacco Use  . Smoking status: Never Smoker  . Smokeless tobacco: Never Used  Vaping Use  . Vaping Use: Never used  Substance Use Topics  . Alcohol use: No  . Drug use: No    Home Medications Prior to Admission medications   Medication Sig Start Date End Date Taking? Authorizing Provider  albuterol (VENTOLIN HFA) 108 (90 Base) MCG/ACT inhaler INHALE 1-2 PUFFS INTO LUNGS EVERY 4-6 HOURS AS NEEDED 03/01/20   [name], NP  aspirin EC 81 MG tablet Take 81 mg by mouth daily at 6 (six) AM. 09/19/19    [provider]  azithromycin (ZITHROMAX Z-PAK) 250 MG tablet As directed 05/07/20   Hassell Done, Mary-Margaret, FNP  benzonatate (TESSALON PERLES) 100 MG capsule Take 1 capsule (100 mg total) by mouth 3 (three) times daily as needed. 05/07/20   Hassell Done Mary-Margaret, FNP  cetirizine (ZYRTEC) 10 MG tablet Take 1 tablet (10 mg total) by mouth daily. 06/11/17   [name], NP  donepezil (ARICEPT) 10 MG tablet Take 1 tablet daily 03/02/20   Garvin Fila, MD  fluticasone [facility]) 50 MCG/ACT nasal spray Place 1-2 sprays into both nostrils daily for 7 days. 09/23/19 10/19/19  Wieters, Hallie C, PA-C  folic acid (FOLVITE) 1 MG tablet Take 1 mg by mouth daily.    [provider]  memantine (NAMENDA) 10 MG tablet TAKE 1 TABLET BY MOUTH TWICE A DAY 03/14/20   Garvin Fila, MD  pantoprazole (PROTONIX) 40 MG tablet TAKE 1 TABLET (40 MG TOTAL) BY MOUTH DAILY. TAKE 30-60 MIN BEFORE FIRST MEAL OF THE DAY 12/03/19   Tanda Rockers, MD  Polyethyl Glycol-Propyl Glycol (SYSTANE) 0.4-0.3 % SOLN Apply 1 drop to eye 2 (two) times daily. 06/12/16   [name], NP  rosuvastatin (CRESTOR) 20 MG tablet Take 1 tablet (20 mg total) by mouth daily. 06/12/19   [name], NP  triamcinolone (KENALOG) 0.1 % APPLY TO AFFECTED AREA TWICE A DAY 03/14/20  [name], NP    Allergies    Patient has no known allergies.  Review of Systems   Review of Systems  Unable to perform ROS: Mental status change  Psychiatric/Behavioral: Positive for confusion.    Physical Exam Updated Vital Signs  ED Triage Vitals  Enc Vitals Group     BP 05/09/20 1136 125/73     Pulse Rate 05/09/20 1136 (!) 117     Resp 05/09/20 1136 (!) 22     Temp 05/09/20 1136 100 F (37.8 C)     Temp Source 05/09/20 1136 Oral     SpO2 05/09/20 1136 93 %     Weight 05/09/20 1140 145 lb 15.1 oz (66.2 kg)     Height 05/09/20 1140 5\' 10"  (1.778 m)     Head Circumference --      Peak Flow --      Pain Score --      Pain Loc --       Pain Edu? --      Excl. in [place]? --     Physical Exam Vitals and nursing note reviewed.  Constitutional:      Appearance: He is well-developed and well-nourished. He is ill-appearing.  HENT:     Head: Normocephalic and atraumatic.     Mouth/Throat:     Mouth: Mucous membranes are dry.  Eyes:     Extraocular Movements: Extraocular movements intact.     Conjunctiva/sclera: Conjunctivae normal.     Pupils: Pupils are equal, round, and reactive to light.  Cardiovascular:     Rate and Rhythm: Regular rhythm. Tachycardia present.     Heart sounds: No murmur heard.   Pulmonary:     Effort: Pulmonary effort is normal. No respiratory distress.     Breath sounds: Normal breath sounds.  Abdominal:     Palpations: Abdomen is soft.     Tenderness: There is no abdominal tenderness.  Musculoskeletal:        General: No swelling or edema.     Cervical back: Neck supple.  Skin:    General: Skin is warm and dry.     Capillary Refill: Capillary refill takes less than 2 seconds.     Findings: No erythema.  Neurological:     General: No focal deficit present.     Mental Status: He is alert.     Comments: Has baseline right-sided weakness, moves left side without any issues, opens eyes spontaneously, mumbles words  Psychiatric:        Mood and Affect: Mood and affect normal.     ED Results / Procedures / Treatments   Labs (all labs ordered are listed, but only abnormal results are displayed) Labs Reviewed  CULTURE, BLOOD (ROUTINE X 2)  CULTURE, BLOOD (ROUTINE X 2)  URINE CULTURE  LACTIC ACID, PLASMA  LACTIC ACID, PLASMA  CBC WITH DIFFERENTIAL/PLATELET  COMPREHENSIVE METABOLIC PANEL  D-DIMER, QUANTITATIVE (NOT AT [facility])  PROCALCITONIN  LACTATE DEHYDROGENASE  FERRITIN  TRIGLYCERIDES  FIBRINOGEN  C-REACTIVE PROTEIN  PROTIME-INR  APTT  URINALYSIS, ROUTINE W REFLEX MICROSCOPIC  POC SARS CORONAVIRUS 2 AG -  ED  POC SARS CORONAVIRUS 2 AG -  ED    EKG EKG  Interpretation  Date/Time:  Monday May 09 2020 11:50:25 EST Ventricular Rate:  112 PR Interval:    QRS Duration: 83 QT Interval:  312 QTC Calculation: 426 R Axis:   -24 Text Interpretation: Sinus tachycardia Inferior infarct, old Confirmed by Lennice Sites [phone]) on 05/09/2020 11:58:54  AM   Radiology DG Chest Port 1 View  Result Date: 05/09/2020 CLINICAL DATA:  Suspected COVID.  Altered mental status. EXAM: PORTABLE CHEST 1 VIEW COMPARISON:  June 12, 2019. FINDINGS: Bibasilar airspace opacities. No visible pleural effusions or pneumothorax. Similar cardiomediastinal silhouette with tortuous aorta. No acute osseous abnormality. IMPRESSION: Bibasilar airspace opacities, concerning for pneumonia. Electronically Signed   By: Margaretha Sheffield MD   On: 05/09/2020 12:19    Procedures .Critical Care Performed by: Lennice Sites, DO Authorized by: Lennice Sites, DO   Critical care provider statement:    Critical care time (minutes):  35   Critical care was necessary to treat or prevent imminent or life-threatening deterioration of the following conditions:  Respiratory failure and sepsis   Critical care was time spent personally by me on the following activities:  Blood draw for specimens, discussions with primary provider, evaluation of patient's response to treatment, examination of patient, obtaining history from patient or surrogate, ordering and performing treatments and interventions, ordering and review of laboratory studies, ordering and review of radiographic studies, pulse oximetry, re-evaluation of patient's condition and review of old charts   I assumed direction of critical care for this patient from another provider in my specialty: no       Medications Ordered in ED Medications  0.9 %  sodium chloride infusion ( Intravenous New Bag/Given 05/09/20 1237)  acetaminophen (TYLENOL) suppository 650 mg (650 mg Rectal Given 05/09/20 1234)    ED Course  I have reviewed the  triage vital signs and the nursing notes.  Pertinent labs & imaging results that were available during my care of the patient were reviewed by me and considered in my medical decision making (see chart for details).    MDM Rules/Calculators/A&P                          Juda Jayon Matton is a 75 year old male with history of stroke, mild dementia who presents the ED with altered mental status.  Fever and respiratory symptoms for the last 4 days.  Other family members with Covid.  Patient is vaccinated but did not have booster.  He arrives with oxygen between 90 to 96% on room air.  He has increased work of breathing.  He is hard of hearing and does not speak English and therefore was able to use family for history.  He has been less responsive today.  Had Tylenol several hours ago but still has fever upon arrival to 102.1.  Not sure if he actually swallowed the medicine or not.  He is tachycardic to the 117.  Mucous membranes are dry.  He has had poor p.o. intake over the last 2 days.  May be some GI symptoms with loose stools.  Patient has a history of getting altered with infections in the past as well.  Overall suspect Covid and fever causing altered mental status.  Will get Covid work-up initiated.  Has not had a Covid test yet.  Suspect that he will be a candidate for IV remdesivir.  Does not have a true oxygen requirement at this time will hold off on steroids.  Will start gentle hydration of fluids with normal saline.  We will get a CT scan to make sure no spontaneous head bleed or other intracranial process.  He appears to be moving his left side without much issues.  He has had a stroke with right-sided residual deficits in the past.  Anticipate admission.  Code  sepsis initiated however will hold antibiotics as suspect viral process.  Covid test is positive.  Chest x-ray consistent with Covid pneumonia.  IV remdesivir has been ordered.  Awaiting screening labs to admit for further care.  Lab  work is overall otherwise unremarkable.  Inflammatory markers are elevated however.  Given dose of IV remdesivir.  Will admit for further care.  This chart was dictated using voice recognition software.  Despite best efforts to proofread,  errors can occur which can change the documentation meaning.    Final Clinical Impression(s) / ED Diagnoses Final diagnoses:  COVID-19  Altered mental status, unspecified altered mental status type  Fever, unspecified fever cause  Acute respiratory failure, unspecified whether with hypoxia or hypercapnia [facility])    Rx / DC Orders ED Discharge Orders    None       Lennice Sites, DO 05/09/20 1459

## 2020-05-09 NOTE — ED Notes (Signed)
Attempted report x 1; was told to by secretary to give her my number per CN because no one is assigned to that room.

## 2020-05-09 NOTE — ED Notes (Signed)
Attempted report x 2; CN refusing to take report at this time, notified CN/director of ED

## 2020-05-09 NOTE — Telephone Encounter (Signed)
Patient's daughter left a voicemail stating that her dad has a fever, cough and  covid.  It appears that patient at at the ED now.

## 2020-05-09 NOTE — ED Notes (Signed)
Daughter in law: [phone]

## 2020-05-09 NOTE — ED Notes (Signed)
Updated pt's family about status and pt going to room [phone]

## 2020-05-09 NOTE — H&P (Signed)
History and Physical    Christopher Murphy ZOX:[id number] DOB: Sep 05, 1945 DOA: 05/09/2020  PCP: [name], NP (Confirm with patient/family/NH records and if not entered, this has to be entered at [facility] point of entry) Patient coming from: Home  I have personally briefly reviewed patient's old medical records in [place]  Chief Complaint: Fever, cough  HPI: Christopher Murphy is a 75 y.o. male with medical history significant of remote CVA with right-sided weakness, advanced dementia, GERD, presented with fever cough and increasing shortness of breath.  Most history was provided by patient son and daughter-in-law over the phone, given patient baseline dementia and speaking only [place].  3 out of 4 family member started to have fever almost the same time 5 days ago, and patient started to have symptoms started 4 days ago, with low-grade fever T-max 101, and developed a dry cough, particularly, family reported patient has been having more cough after eat.  And since 2 days ago, patient became weak compared to his baseline when he was able to ambulate himself and feed himself.  Family had to carry him to bathroom since yesterday because of the weakness.  And patient appears to have more trouble breathing last night and have 1 loose bowel movement.  Family contact PCP who ordered the Zithromax and Tessalon patient has been taking for last 2 days without any improvement. ED Course: Fever of 102 and chest xray showed B/L lower field's infiltrates.  WBC 8.9, D-dimer 0.85.  Potassium 3.1.  Review of Systems: Unable to perform, patient baseline dementia.  Past Medical History:  Diagnosis Date  . Blood clots in brain 2009  . Stroke [facility])     Past Surgical History:  Procedure Laterality Date  . COLONOSCOPY WITH PROPOFOL N/A 04/30/2019   Procedure: COLONOSCOPY WITH PROPOFOL;  Surgeon: Jonathon Bellows, MD;  Location: [facility] ENDOSCOPY;  Service: Gastroenterology;  Laterality: N/A;  .  PROSTATECTOMY  1980     reports that he has never smoked. He has never used smokeless tobacco. He reports that he does not drink alcohol and does not use drugs.  No Known Allergies  Family History  Problem Relation Age of Onset  . AAA (abdominal aortic aneurysm) Neg Hx     Prior to Admission medications   Medication Sig Start Date End Date Taking? Authorizing Provider  albuterol (VENTOLIN HFA) 108 (90 Base) MCG/ACT inhaler INHALE 1-2 PUFFS INTO LUNGS EVERY 4-6 HOURS AS NEEDED 03/01/20   [name], NP  aspirin EC 81 MG tablet Take 81 mg by mouth daily at 6 (six) AM. 09/19/19   [provider]  azithromycin (ZITHROMAX Z-PAK) 250 MG tablet As directed 05/07/20   Hassell Done, Mary-Margaret, FNP  benzonatate (TESSALON PERLES) 100 MG capsule Take 1 capsule (100 mg total) by mouth 3 (three) times daily as needed. 05/07/20   Hassell Done Mary-Margaret, FNP  cetirizine (ZYRTEC) 10 MG tablet Take 1 tablet (10 mg total) by mouth daily. 06/11/17   [name], NP  donepezil (ARICEPT) 10 MG tablet Take 1 tablet daily 03/02/20   Garvin Fila, MD  fluticasone [facility]) 50 MCG/ACT nasal spray Place 1-2 sprays into both nostrils daily for 7 days. 09/23/19 10/19/19  Wieters, Hallie C, PA-C  folic acid (FOLVITE) 1 MG tablet Take 1 mg by mouth daily.    [provider]  memantine (NAMENDA) 10 MG tablet TAKE 1 TABLET BY MOUTH TWICE A DAY 03/14/20   Garvin Fila, MD  pantoprazole (PROTONIX) 40 MG tablet  TAKE 1 TABLET (40 MG TOTAL) BY MOUTH DAILY. TAKE 30-60 MIN BEFORE FIRST MEAL OF THE DAY 12/03/19   Tanda Rockers, MD  Polyethyl Glycol-Propyl Glycol (SYSTANE) 0.4-0.3 % SOLN Apply 1 drop to eye 2 (two) times daily. 06/12/16   [name], NP  rosuvastatin (CRESTOR) 20 MG tablet Take 1 tablet (20 mg total) by mouth daily. 06/12/19   [name], NP  triamcinolone (KENALOG) 0.1 % APPLY TO AFFECTED AREA TWICE A DAY 03/14/20   [name], NP    Physical Exam: Vitals:   05/09/20 1400  05/09/20 1415 05/09/20 1430 05/09/20 1445  BP: 132/71 131/69 133/72 136/73  Pulse: (!) 110 (!) 110 (!) 108 (!) 107  Resp: (!) 23 (!) 24 (!) 26 (!) 26  Temp:      TempSrc:      SpO2: 95% 93% 93% 93%  Weight:      Height:        Constitutional: NAD, calm, comfortable Vitals:   05/09/20 1400 05/09/20 1415 05/09/20 1430 05/09/20 1445  BP: 132/71 131/69 133/72 136/73  Pulse: (!) 110 (!) 110 (!) 108 (!) 107  Resp: (!) 23 (!) 24 (!) 26 (!) 26  Temp:      TempSrc:      SpO2: 95% 93% 93% 93%  Weight:      Height:       Eyes: PERRL, lids and conjunctivae normal ENMT: Mucous membranes are dry. Posterior pharynx clear of any exudate or lesions.Normal dentition.  Neck: normal, supple, no masses, no thyromegaly Respiratory: clear to auscultation bilaterally, scattered wheezing but coarse crackles on bilateral lower fields R>L.  Increasing respiratory effort. No accessory muscle use.  Cardiovascular: Regular rate and rhythm, no murmurs / rubs / gallops. No extremity edema. 2+ pedal pulses. No carotid bruits.  Abdomen: no tenderness, no masses palpated. No hepatosplenomegaly. Bowel sounds positive.  Musculoskeletal: no clubbing / cyanosis. No joint deformity upper and lower extremities. Good ROM, no contractures. Normal muscle tone.  Skin: no rashes, lesions, ulcers. No induration Neurologic: No facial droop, right-sided weakness  psychiatric:, Confused   Labs on Admission: I have personally reviewed following labs and imaging studies  CBC: Recent Labs  Lab 05/09/20 1232  WBC 8.9  NEUTROABS 7.6  HGB 12.9*  HCT 38.8*  MCV 79.7*  PLT 123456   Basic Metabolic Panel: Recent Labs  Lab 05/09/20 1232  NA 136  K 3.1*  CL 99  CO2 25  GLUCOSE 106*  BUN 7*  CREATININE 0.84  CALCIUM 8.6*   GFR: Estimated Creatinine Clearance: 72.2 mL/min (by C-G formula based on SCr of 0.84 mg/dL). Liver Function Tests: Recent Labs  Lab 05/09/20 1232  AST 36  ALT 42  ALKPHOS 103  BILITOT 0.8   PROT 7.0  ALBUMIN 2.6*   No results for input(s): LIPASE, AMYLASE in the last 168 hours. No results for input(s): AMMONIA in the last 168 hours. Coagulation Profile: Recent Labs  Lab 05/09/20 1232  INR 1.2   Cardiac Enzymes: No results for input(s): CKTOTAL, CKMB, CKMBINDEX, TROPONINI in the last 168 hours. BNP (last 3 results) No results for input(s): PROBNP in the last 8760 hours. HbA1C: No results for input(s): HGBA1C in the last 72 hours. CBG: No results for input(s): GLUCAP in the last 168 hours. Lipid Profile: Recent Labs    05/09/20 1232  TRIG 76   Thyroid Function Tests: No results for input(s): TSH, T4TOTAL, FREET4, T3FREE, THYROIDAB in the last 72 hours. Anemia Panel:  Recent Labs    05/09/20 1232  FERRITIN 362*   Urine analysis:    Component Value Date/Time   COLORURINE YELLOW (A) 10/28/2019 1222   APPEARANCEUR CLEAR (A) 10/28/2019 1222   LABSPEC 1.014 10/28/2019 1222   PHURINE 6.0 10/28/2019 1222   GLUCOSEU NEGATIVE 10/28/2019 1222   HGBUR MODERATE (A) 10/28/2019 1222   BILIRUBINUR neg 11/13/2019 1741   KETONESUR NEGATIVE 10/28/2019 1222   PROTEINUR Positive (A) 11/13/2019 1741   PROTEINUR NEGATIVE 10/28/2019 1222   UROBILINOGEN 0.2 11/13/2019 1741   NITRITE neg 11/13/2019 1741   NITRITE NEGATIVE 10/28/2019 1222   LEUKOCYTESUR Negative 11/13/2019 1741   LEUKOCYTESUR NEGATIVE 10/28/2019 1222    Radiological Exams on Admission: CT Head Wo Contrast  Result Date: 05/09/2020 CLINICAL DATA:  Altered mental status. EXAM: CT HEAD WITHOUT CONTRAST TECHNIQUE: Contiguous axial images were obtained from the base of the skull through the vertex without intravenous contrast. COMPARISON:  10/28/2019 head CT and brain MR. FINDINGS: Brain: Advanced for age cerebral atrophy. Severe low density in the periventricular white matter likely related to small vessel disease. Remote left PCA distribution infarct. No mass lesion, hemorrhage, hydrocephalus, acute infarct,  intra-axial, or extra-axial fluid collection. Vascular: No hyperdense vessel or unexpected calcification. Intracranial atherosclerosis. Skull: No significant soft tissue swelling.  No skull fracture. Sinuses/Orbits: Normal imaged portions of the orbits and globes. Mucosal thickening ethmoid air cells. Clear mastoid air cells. Other: None. IMPRESSION: 1. No acute intracranial abnormality. 2. Cerebral atrophy and small vessel ischemic change. 3. Remote left PCA distribution infarct. 4. Sinus disease. Electronically Signed   By: Abigail Miyamoto M.D.   On: 05/09/2020 13:18   DG Chest Port 1 View  Result Date: 05/09/2020 CLINICAL DATA:  Suspected COVID.  Altered mental status. EXAM: PORTABLE CHEST 1 VIEW COMPARISON:  June 12, 2019. FINDINGS: Bibasilar airspace opacities. No visible pleural effusions or pneumothorax. Similar cardiomediastinal silhouette with tortuous aorta. No acute osseous abnormality. IMPRESSION: Bibasilar airspace opacities, concerning for pneumonia. Electronically Signed   By: Margaretha Sheffield MD   On: 05/09/2020 12:19    EKG: Independently reviewed.  Sinus tachycardia, no acute ST-T changes  Assessment/Plan Active Problems:   COVID-19 virus infection   COVID-19  (please populate well all problems here in Problem List. (For example, if patient is on BP meds at home and you resume or decide to hold them, it is a problem that needs to be her. Same for CAD, COPD, HLD and so on)  Sepsis -Evidenced by fever, tachycardia, tachypneic, cirrhosis, pneumonia plus minus aspiration pneumonia.  Ordered CT scan without contrast to look for evidence of aspiration.  Given the significant physical examination showing bilateral lower field crackles as well as members report. -Remdesivir and steroid, dependent on finding on CT scan will consider Aspiration coverage -N.p.o. for now speech evaluation -UA pending  Hypokalemia -Replace and recheck -Magnesium level  Acute metabolic  encephalopathy -Secondary sepsis, expanded report patient has been more confused than his baseline. -Avoid sedation medications  Baseline dementia -Continue home meds   Deconditioning -PT evaluation  Elevated D-dimer -Trend to decide further imaging.  DVT prophylaxis: Lovenox  code Status:Full Code Family Communication: Son and daughter in law Disposition Plan: Expect more than 2 days hospital stay for COVID-19 pneumonia treatment Consults called: None Admission status: Tele admit   Lequita Halt MD [facility] Pager [phone]  05/09/2020, 3:46 PM

## 2020-05-09 NOTE — Progress Notes (Signed)
CT image reviewed. B/L lower lungs infiltrates more compatible with aspiration.  NPO, consult pharmacy for Unasyn.

## 2020-05-09 NOTE — ED Triage Notes (Addendum)
Pt arrived to ED via EMS from home w/ AMS, Resp distress x 4 days, tx w/ abx. Family reports today pt won't follow commands, talk. Family is covid positive as of early last week. S/S onset 4 days ago. Family reports decreased appetite x 1 week. Pt has hx of previous CVA w L sided deficits  EMS IV 18g LAC VS: Temp 101.2, BP 120/86, RR 26, HR 118, 93% RA, pt placed on 1L and came up to 97%, ETCO2 27-30 CBG 112. Pt is vaccinated, but not boosted.  0930 500mg  tylenol per family and not able to swallow.

## 2020-05-10 DIAGNOSIS — J9600 Acute respiratory failure, unspecified whether with hypoxia or hypercapnia: Secondary | ICD-10-CM

## 2020-05-10 LAB — URINALYSIS, ROUTINE W REFLEX MICROSCOPIC
Bacteria, UA: NONE SEEN
Bilirubin Urine: NEGATIVE
Glucose, UA: NEGATIVE mg/dL
Ketones, ur: NEGATIVE mg/dL
Leukocytes,Ua: NEGATIVE
Nitrite: NEGATIVE
Protein, ur: 100 mg/dL — AB
RBC / HPF: >50 RBC/hpf — ABNORMAL HIGH (ref 0–5)
Specific Gravity, Urine: 1.026 (ref 1.005–1.030)
pH: 5 (ref 5.0–8.0)

## 2020-05-10 LAB — CBC WITH DIFFERENTIAL/PLATELET
Abs Immature Granulocytes: 0 10*3/uL (ref 0.00–0.07)
Basophils Absolute: 0 10*3/uL (ref 0.0–0.1)
Basophils Relative: 0 %
Eosinophils Absolute: 0 10*3/uL (ref 0.0–0.5)
Eosinophils Relative: 0 %
HCT: 32.8 % — ABNORMAL LOW (ref 39.0–52.0)
Hemoglobin: 11.4 g/dL — ABNORMAL LOW (ref 13.0–17.0)
Lymphocytes Relative: 4 %
Lymphs Abs: 0.5 10*3/uL — ABNORMAL LOW (ref 0.7–4.0)
MCH: 27.1 pg (ref 26.0–34.0)
MCHC: 34.8 g/dL (ref 30.0–36.0)
MCV: 77.9 fL — ABNORMAL LOW (ref 80.0–100.0)
Monocytes Absolute: 0 10*3/uL — ABNORMAL LOW (ref 0.1–1.0)
Monocytes Relative: 0 %
Neutro Abs: 10.9 10*3/uL — ABNORMAL HIGH (ref 1.7–7.7)
Neutrophils Relative %: 96 %
Platelets: 362 10*3/uL (ref 150–400)
RBC: 4.21 MIL/uL — ABNORMAL LOW (ref 4.22–5.81)
RDW: 13.2 % (ref 11.5–15.5)
WBC: 11.4 10*3/uL — ABNORMAL HIGH (ref 4.0–10.5)
nRBC: 0 % (ref 0.0–0.2)
nRBC: 0 /100 WBC

## 2020-05-10 LAB — FERRITIN: Ferritin: 333 ng/mL (ref 24–336)

## 2020-05-10 LAB — COMPREHENSIVE METABOLIC PANEL
ALT: 33 U/L (ref 0–44)
AST: 22 U/L (ref 15–41)
Albumin: 2 g/dL — ABNORMAL LOW (ref 3.5–5.0)
Alkaline Phosphatase: 81 U/L (ref 38–126)
Anion gap: 10 (ref 5–15)
BUN: 10 mg/dL (ref 8–23)
CO2: 23 mmol/L (ref 22–32)
Calcium: 8.2 mg/dL — ABNORMAL LOW (ref 8.9–10.3)
Chloride: 103 mmol/L (ref 98–111)
Creatinine, Ser: 0.71 mg/dL (ref 0.61–1.24)
GFR, Estimated: >60 mL/min (ref >60)
Glucose, Bld: 147 mg/dL — ABNORMAL HIGH (ref 70–99)
Potassium: 3.2 mmol/L — ABNORMAL LOW (ref 3.5–5.1)
Sodium: 136 mmol/L (ref 135–145)
Total Bilirubin: 0.8 mg/dL (ref 0.3–1.2)
Total Protein: 5.9 g/dL — ABNORMAL LOW (ref 6.5–8.1)

## 2020-05-10 LAB — D-DIMER, QUANTITATIVE: D-Dimer, Quant: 0.7 ug/mL-FEU — ABNORMAL HIGH (ref 0.00–0.50)

## 2020-05-10 LAB — C-REACTIVE PROTEIN: CRP: 23.1 mg/dL — ABNORMAL HIGH (ref <1.0)

## 2020-05-10 LAB — PHOSPHORUS: Phosphorus: 2.5 mg/dL (ref 2.5–4.6)

## 2020-05-10 LAB — MAGNESIUM: Magnesium: 1.9 mg/dL (ref 1.7–2.4)

## 2020-05-10 LAB — HEPATITIS B SURFACE ANTIGEN: Hepatitis B Surface Ag: NONREACTIVE

## 2020-05-10 MED ORDER — TAMSULOSIN HCL 0.4 MG PO CAPS
0.4000 mg | ORAL_CAPSULE | Freq: Every day | ORAL | Status: DC
  Administered 2020-05-10 – 2020-05-13 (×4): 0.4 mg via ORAL
  Filled 2020-05-10 (×4): qty 1

## 2020-05-10 MED ORDER — POTASSIUM CHLORIDE CRYS ER 20 MEQ PO TBCR
40.0000 meq | EXTENDED_RELEASE_TABLET | Freq: Four times a day (QID) | ORAL | Status: AC
  Administered 2020-05-10 (×2): 40 meq via ORAL
  Filled 2020-05-10 (×2): qty 2

## 2020-05-10 MED ORDER — METHYLPREDNISOLONE SODIUM SUCC 40 MG IJ SOLR
40.0000 mg | Freq: Two times a day (BID) | INTRAMUSCULAR | Status: AC
  Administered 2020-05-10 – 2020-05-12 (×4): 40 mg via INTRAVENOUS
  Filled 2020-05-10 (×4): qty 1

## 2020-05-10 MED ORDER — PREDNISONE 5 MG PO TABS
50.0000 mg | ORAL_TABLET | Freq: Every day | ORAL | Status: DC
  Administered 2020-05-12 – 2020-05-13 (×2): 50 mg via ORAL
  Filled 2020-05-10 (×3): qty 2

## 2020-05-10 MED ORDER — CHLORHEXIDINE GLUCONATE CLOTH 2 % EX PADS
6.0000 | MEDICATED_PAD | Freq: Every day | CUTANEOUS | Status: DC
  Administered 2020-05-10 – 2020-05-13 (×4): 6 via TOPICAL

## 2020-05-10 NOTE — Evaluation (Signed)
Clinical/Bedside Swallow Evaluation Patient Details  Name: Christopher Murphy MRN: [id number] Date of Birth: [birth year]  Today's Date: 05/10/2020 Time: SLP Start Time (ACUTE ONLY): 1941 SLP Stop Time (ACUTE ONLY): 1324 SLP Time Calculation (min) (ACUTE ONLY): 19 min  Past Medical History:  Past Medical History:  Diagnosis Date  . Blood clots in brain 2009  . Stroke [facility])    Past Surgical History:  Past Surgical History:  Procedure Laterality Date  . COLONOSCOPY WITH PROPOFOL N/A 04/30/2019   Procedure: COLONOSCOPY WITH PROPOFOL;  Surgeon: Jonathon Bellows, MD;  Location: [facility] ENDOSCOPY;  Service: Gastroenterology;  Laterality: N/A;  . PROSTATECTOMY  1980   HPI:  75 y.o. male, speaks Punj[place], with medical history significant for remote CVA with right-sided weakness, advanced dementia, GERD, presented with fever cough and increasing shortness of breath, dx COVID-19. CT 1/31 Remote left PCA distribution infarct, advanced-for-age atrophy.   Assessment / Plan / Recommendation Clinical Impression  Pt's swallow was evaluated at the bedside with assistance from video translation system.  [name] was pleasant and interactive; he stated that he was at [facility].  He follow commands; there is a mild right facial asymmetry likely baseline from prior left CVA.  Pt declined many of the items on his vegetarian lunch tray, saying repeatedly, "I will eat when I go home." He was able to drink from edge of cup and straw with no overt s/s of aspiration.  He demonstrated prolonged but functional mastication of fruit cup.  Many of the items were too cold for his liking, so he declined.  There were no clinical s/s of aspiration to confirm suspicions on CXR.  Recommend continuing regular consistency diet to afford as much variety as possible; continue thin liquids.  Give meds whole in puree given RN's report of coughing with meds/liquid.  No acute SLP needs are identified.  Our service will sign off. SLP Visit  Diagnosis: Dysphagia, unspecified (R13.10)    Aspiration Risk       Diet Recommendation   regular solids, vegetarian; thin liquids  Medication Administration: Whole meds with puree    Other  Recommendations Oral Care Recommendations: Oral care BID   Follow up Recommendations None      Frequency and Duration            Prognosis        Swallow Study   General HPI: 75 y.o. male, speaks [place], with medical history significant for remote CVA with right-sided weakness, advanced dementia, GERD, presented with fever cough and increasing shortness of breath, dx COVID-19. CT 1/31 Remote left PCA distribution infarct, advanced-for-age atrophy. Type of Study: Bedside Swallow Evaluation Diet Prior to this Study: Regular;Thin liquids Temperature Spikes Noted: No Respiratory Status: Room air History of Recent Intubation: No Behavior/Cognition: Alert;Cooperative Oral Cavity Assessment: Within Functional Limits Oral Care Completed by SLP: No Oral Cavity - Dentition: Adequate natural dentition Vision: Functional for self-feeding Self-Feeding Abilities: Needs assist Patient Positioning: Upright in bed Baseline Vocal Quality: Normal Volitional Cough: Strong Volitional Swallow: Able to elicit    Oral/Motor/Sensory Function Overall Oral Motor/Sensory Function: Mild impairment Facial Symmetry: Abnormal symmetry right;Suspected CN VII (facial) dysfunction   Ice Chips Ice chips: Not tested (pt declined)   Thin Liquid Thin Liquid: Within functional limits Presentation: Cup;Straw    Nectar Thick Nectar Thick Liquid: Not tested   Honey Thick Honey Thick Liquid: Not tested   Puree Puree: Within functional limits   Solid     Solid: Impaired Oral Phase Functional Implications: Prolonged  oral transit      Christopher Murphy 05/10/2020,1:35 PM  Estill Bamberg L. Tivis Ringer, [place] Office number [phone] Pager [phone]

## 2020-05-10 NOTE — Progress Notes (Signed)
PROGRESS NOTE                                                                             PROGRESS NOTE                                                                                                                                                                                                             Patient Demographics:    Christopher Murphy, is a 75 y.o. male, DOB - 20-Feb-1946, [id number]  Outpatient Primary MD for the patient is [name], NP    LOS - 1  Admit date - 05/09/2020    Chief Complaint  Patient presents with  . AMS  . Suspected COVID       Brief Narrative    75 y.o. male with medical history significant of remote CVA with right-sided weakness, advanced dementia, GERD, presented with fever cough and increasing shortness of breath.  And progressive weakness.  Symptoms started 4 days ago, with low-grade fever T-max 101, and developed a dry cough, particularly, family reported patient has been having more cough after eat, in ED patient was noted to have fever of 102 and chest xray showed B/L lower field's infiltrates.  WBC 8.9, D-dimer 0.85.  Potassium 3.1.  As well work-up significant for COVID-19 of pneumonia.    Subjective:    Christopher Murphy today currently confused, unable to relay any significant complaints, he had urinary retention overnight required in and out x1 with 700 cc output .   Assessment  & Plan :    Active Problems:   COVID-19 virus infection   COVID-19  Covid 19 of pneumonia -Patient is vaccinated with [place] in February, but not boosted -CT chest significant for multifocal bibasilar infiltrate, suspicious for aspiration and Covid pneumonia. -Continue with IV remdesivir. -Continue  with IV Solu-Medrol. -CRP significantly elevated and trending up, continue with steroids and antibiotics and continue to monitor closely. -Concern for aspiration pneumonia, continue with Unasyn, trend procalcitonin, SLP  consulted as well, son report he had modified  barium swallow at [facility] last August where he did good. -Discussed with staff, patient with dementia, but they will try to encourage incentive spirometry and flutter valve.  Rosanne Gutting retention -Required in and out overnight, so far no evidence of recurrent retention, will monitor closely with bladder scans every 8 hours. -Started on Flomax  Hypokalemia -Repleted, recheck in a.m.  History of dementia/CVA -Tinea with aspirin for history of CVA -Continue with Aricept and Namenda for dementia -Avoid sedation medications  Deconditioning -PT evaluation   SpO2: 94 % O2 Flow Rate (L/min): 1 L/min  Recent Labs  Lab 05/09/20 1232 05/10/20 0211  WBC 8.9 11.4*  PLT 388 362  CRP 15.7* 23.1*  DDIMER 0.85* 0.70*  PROCALCITON 0.13  --   AST 36 22  ALT 42 33  ALKPHOS 103 81  BILITOT 0.8 0.8  ALBUMIN 2.6* 2.0*  INR 1.2  --   LATICACIDVEN 1.4  --        ABG  No results found for: PHART, PCO2ART, PO2ART, HCO3, TCO2, ACIDBASEDEF, O2SAT          Condition - Extremely Guarded  Family Communication  :  Son by phone  Code Status :  Full  Consults  :  none  Disposition Plan  :    Status is: Inpatient  Remains inpatient appropriate because:IV treatments appropriate due to intensity of illness or inability to take PO   Dispo: The patient is from: Home              Anticipated d/c is to: Home              Anticipated d/c date is: 3 days              Patient currently is not medically stable to d/c.   Difficult to place patient No       DVT Prophylaxis  :  Lovenox  Lab Results  Component Value Date   PLT 362 05/10/2020    Diet :  Diet Order            Diet vegetarian Room service appropriate? No; Fluid consistency: Thin  Diet effective now                  Inpatient Medications  Scheduled Meds: . aspirin EC  81 mg Oral Q0600  . donepezil  10 mg Oral QHS  . enoxaparin (LOVENOX) injection  40 mg  Subcutaneous Q24H  . fluticasone  1-2 spray Each Nare Daily  . folic acid  1 mg Oral Daily  . Ipratropium-Albuterol  1 puff Inhalation Q6H  . loratadine  10 mg Oral Daily  . memantine  10 mg Oral BID  . methylPREDNISolone (SOLU-MEDROL) injection  0.5 mg/kg Intravenous Q12H   Followed by  . [START ON 05/12/2020] predniSONE  50 mg Oral Daily  . pantoprazole  40 mg Oral Daily  . polyvinyl alcohol  1 drop Both Eyes BID  . potassium chloride  40 mEq Oral Once  . potassium chloride  40 mEq Oral Q6H  . rosuvastatin  20 mg Oral Daily  . tamsulosin  0.4 mg Oral QPC breakfast   Continuous Infusions: . sodium chloride 100 mL/hr at 05/10/20 0853  . ampicillin-sulbactam (UNASYN) IV 3 g (05/10/20 1043)  . remdesivir 100 mg in NS 100 mL 100 mg (05/10/20 0858)   PRN Meds:.acetaminophen, benzonatate, ondansetron **OR** ondansetron (ZOFRAN) IV, senna-docusate  Antibiotics  :    Anti-infectives (From admission, onward)   Start  Dose/Rate Route Frequency Ordered Stop   05/10/20 1000  remdesivir 100 mg in sodium chloride 0.9 % 100 mL IVPB       "Followed by" Linked Group Details   100 mg 200 mL/hr over 30 Minutes Intravenous Daily 05/09/20 1315 05/14/20 0959   05/09/20 1700  Ampicillin-Sulbactam (UNASYN) 3 g in sodium chloride 0.9 % 100 mL IVPB        3 g 200 mL/hr over 30 Minutes Intravenous Every 6 hours 05/09/20 1652     05/09/20 1500  remdesivir 200 mg in sodium chloride 0.9% 250 mL IVPB       "Followed by" Linked Group Details   200 mg 580 mL/hr over 30 Minutes Intravenous Once 05/09/20 1315 05/09/20 2356        Kyrstal Monterrosa M.D on 05/10/2020 at 12:33 PM  To page go to [email or web address]   [facility] -  Office  [phone]       Objective:   Vitals:   05/09/20 2000 05/09/20 2345 05/10/20 0450 05/10/20 0734  BP:  122/73 118/68 117/80  Pulse:  81 83 87  Resp:  20 18 19   Temp: 98.7 F (37.1 C) 99 F (37.2 C) 97.8 F (36.6 C) (!) 97.2 F (36.2 C)  TempSrc: Oral  Oral Oral Axillary  SpO2:  96% 92% 94%  Weight:      Height:        Wt Readings from Last 3 Encounters:  05/09/20 66.2 kg  02/25/20 66.2 kg  02/08/20 66.6 kg     Intake/Output Summary (Last 24 hours) at 05/10/2020 1233 Last data filed at 05/10/2020 0533 Gross per 24 hour  Intake 1898.3 ml  Output 700 ml  Net 1198.3 ml     Physical Exam  [place]  Tele iterpreter was used  Patient is awake, communicative, but confused, oriented to name , frail and deconditioned . Symmetrical Chest wall movement, Good air movement bilaterally, CTAB RRR,No Gallops,Rubs or new Murmurs, No Parasternal Heave +ve B.Sounds, Abd Soft, No rebound - guarding or rigidity. No Cyanosis, Clubbing or edema, No new Rash or bruise      Data Review:    CBC Recent Labs  Lab 05/09/20 1232 05/10/20 0211  WBC 8.9 11.4*  HGB 12.9* 11.4*  HCT 38.8* 32.8*  PLT 388 362  MCV 79.7* 77.9*  MCH 26.5 27.1  MCHC 33.2 34.8  RDW 13.0 13.2  LYMPHSABS 0.6* 0.5*  MONOABS 0.5 0.0*  EOSABS 0.0 0.0  BASOSABS 0.0 0.0    Recent Labs  Lab 05/09/20 1232 05/09/20 1831 05/10/20 0211  NA 136  --  136  K 3.1*  --  3.2*  CL 99  --  103  CO2 25  --  23  GLUCOSE 106*  --  147*  BUN 7*  --  10  CREATININE 0.84  --  0.71  CALCIUM 8.6*  --  8.2*  AST 36  --  22  ALT 42  --  33  ALKPHOS 103  --  81  BILITOT 0.8  --  0.8  ALBUMIN 2.6*  --  2.0*  MG  --  1.9 1.9  CRP 15.7*  --  23.1*  DDIMER 0.85*  --  0.70*  PROCALCITON 0.13  --   --   LATICACIDVEN 1.4  --   --   INR 1.2  --   --     ------------------------------------------------------------------------------------------------------------------ Recent Labs    05/09/20 1232  TRIG 76    Lab Results  Component Value  Date   HGBA1C 5.3 10/29/2019   ------------------------------------------------------------------------------------------------------------------ No results for input(s): TSH, T4TOTAL, T3FREE, THYROIDAB in the last 72 hours.  Invalid  input(s): FREET3  Cardiac Enzymes No results for input(s): CKMB, TROPONINI, MYOGLOBIN in the last 168 hours.  Invalid input(s): CK ------------------------------------------------------------------------------------------------------------------ No results found for: BNP  Micro Results No results found for this or any previous visit (from the past 240 hour(s)).  Radiology Reports CT Head Wo Contrast  Result Date: 05/09/2020 CLINICAL DATA:  Altered mental status. EXAM: CT HEAD WITHOUT CONTRAST TECHNIQUE: Contiguous axial images were obtained from the base of the skull through the vertex without intravenous contrast. COMPARISON:  10/28/2019 head CT and brain MR. FINDINGS: Brain: Advanced for age cerebral atrophy. Severe low density in the periventricular white matter likely related to small vessel disease. Remote left PCA distribution infarct. No mass lesion, hemorrhage, hydrocephalus, acute infarct, intra-axial, or extra-axial fluid collection. Vascular: No hyperdense vessel or unexpected calcification. Intracranial atherosclerosis. Skull: No significant soft tissue swelling.  No skull fracture. Sinuses/Orbits: Normal imaged portions of the orbits and globes. Mucosal thickening ethmoid air cells. Clear mastoid air cells. Other: None. IMPRESSION: 1. No acute intracranial abnormality. 2. Cerebral atrophy and small vessel ischemic change. 3. Remote left PCA distribution infarct. 4. Sinus disease. Electronically Signed   By: Abigail Miyamoto M.D.   On: 05/09/2020 13:18   CT CHEST WO CONTRAST  Result Date: 05/09/2020 CLINICAL DATA:  Aspiration, COVID pneumonia EXAM: CT CHEST WITHOUT CONTRAST TECHNIQUE: Multidetector CT imaging of the chest was performed following the standard protocol without IV contrast. COMPARISON:  10/29/2019 FINDINGS: Cardiovascular: Moderate coronary artery calcification. Global cardiac size within normal limits. Small pericardial effusion is present, stable since prior examination.  Central pulmonary arteries are of normal caliber. The thoracic aorta is mildly dilated measuring 4.0 cm in its ascending segment and 3.5 cm in its proximal descending segment. The aorta measures 2.8 cm in diameter at the level of the left atrium. This appears slightly progressive since prior examination where this measured 3.6 cm and 3.0 cm, respectively. Mediastinum/Nodes: Pathologic aortopulmonary window lymphadenopathy is again identified and appears progressive measuring 2.1 x 3.4 cm at axial image # 53/3. Several shotty precarinal lymph nodes are identified. No additional pathologic thoracic adenopathy is seen. Thyroid unremarkable. Esophagus unremarkable. Lungs/Pleura: There is bilateral lower lobe depending consolidation as well as airspace infiltrate within the posterior segment of the right upper lobe. Additionally, there is debris seen within the right mainstem bronchus. Together, the findings are suspicious for changes of aspiration pneumonia. There is associated bronchial wall thickening in keeping with airway inflammation. No pneumothorax or pleural effusion. Upper Abdomen: Unremarkable Musculoskeletal: No acute bone abnormality. IMPRESSION: Interval development of bilateral dependent lower lobe consolidation and focal infiltrate within the posterior basal segment of the right upper lobe most in keeping with changes of aspiration pneumonia. Debris seen within the central airways without evidence of central obstructing lesion. Moderate coronary artery calcification. Progressive dilation of the thoracic aorta. This would be better assessed with dedicated CT arteriography once the patient's acute issues have resolved. If confirmed, recommend annual imaging followup by CTA or MRA. This recommendation follows 2010 ACCF/AHA/AATS/ACR/ASA/SCA/SCAI/SIR/STS/SVM Guidelines for the Diagnosis and Management of Patients with Thoracic Aortic Disease. Circulation. 2010; [id number]. Aortic aneurysm NOS (ICD10-I71.9)  Progressive pathologic aortopulmonary window lymphadenopathy, nonspecific. This could be re-evaluated a CT imaging in 6-12 weeks once the patient's acute issues have resolved. Alternatively, metabolic activity could be assessed with PET CT examination, again when the patient's acute issues have  resolved. Aortic Atherosclerosis (ICD10-I70.0). Electronically Signed   By: Fidela Salisbury MD   On: 05/09/2020 16:35   DG Chest Port 1 View  Result Date: 05/09/2020 CLINICAL DATA:  Suspected COVID.  Altered mental status. EXAM: PORTABLE CHEST 1 VIEW COMPARISON:  June 12, 2019. FINDINGS: Bibasilar airspace opacities. No visible pleural effusions or pneumothorax. Similar cardiomediastinal silhouette with tortuous aorta. No acute osseous abnormality. IMPRESSION: Bibasilar airspace opacities, concerning for pneumonia. Electronically Signed   By: Margaretha Sheffield MD   On: 05/09/2020 12:19

## 2020-05-10 NOTE — Progress Notes (Signed)
Patient was holding urine. Notified on call MD and did in and out catheter. Output volume was 700 ml. Send the sample for urinalysis and urine culture. Will continue monitor.

## 2020-05-11 LAB — URINE CULTURE: Culture: NO GROWTH

## 2020-05-11 LAB — COMPREHENSIVE METABOLIC PANEL
ALT: 30 U/L (ref 0–44)
AST: 28 U/L (ref 15–41)
Albumin: 2 g/dL — ABNORMAL LOW (ref 3.5–5.0)
Alkaline Phosphatase: 82 U/L (ref 38–126)
Anion gap: 9 (ref 5–15)
BUN: 21 mg/dL (ref 8–23)
CO2: 20 mmol/L — ABNORMAL LOW (ref 22–32)
Calcium: 8.2 mg/dL — ABNORMAL LOW (ref 8.9–10.3)
Chloride: 108 mmol/L (ref 98–111)
Creatinine, Ser: 0.76 mg/dL (ref 0.61–1.24)
GFR, Estimated: >60 mL/min (ref >60)
Glucose, Bld: 150 mg/dL — ABNORMAL HIGH (ref 70–99)
Potassium: 4.2 mmol/L (ref 3.5–5.1)
Sodium: 137 mmol/L (ref 135–145)
Total Bilirubin: 0.6 mg/dL (ref 0.3–1.2)
Total Protein: 5.8 g/dL — ABNORMAL LOW (ref 6.5–8.1)

## 2020-05-11 LAB — CBC WITH DIFFERENTIAL/PLATELET
Abs Immature Granulocytes: 0.23 10*3/uL — ABNORMAL HIGH (ref 0.00–0.07)
Basophils Absolute: 0 10*3/uL (ref 0.0–0.1)
Basophils Relative: 0 %
Eosinophils Absolute: 0 10*3/uL (ref 0.0–0.5)
Eosinophils Relative: 0 %
HCT: 30.4 % — ABNORMAL LOW (ref 39.0–52.0)
Hemoglobin: 10.7 g/dL — ABNORMAL LOW (ref 13.0–17.0)
Immature Granulocytes: 1 %
Lymphocytes Relative: 5 %
Lymphs Abs: 0.9 10*3/uL (ref 0.7–4.0)
MCH: 27.6 pg (ref 26.0–34.0)
MCHC: 35.2 g/dL (ref 30.0–36.0)
MCV: 78.4 fL — ABNORMAL LOW (ref 80.0–100.0)
Monocytes Absolute: 0.7 10*3/uL (ref 0.1–1.0)
Monocytes Relative: 4 %
Neutro Abs: 16 10*3/uL — ABNORMAL HIGH (ref 1.7–7.7)
Neutrophils Relative %: 90 %
Platelets: 406 10*3/uL — ABNORMAL HIGH (ref 150–400)
RBC: 3.88 MIL/uL — ABNORMAL LOW (ref 4.22–5.81)
RDW: 13.3 % (ref 11.5–15.5)
WBC: 17.9 10*3/uL — ABNORMAL HIGH (ref 4.0–10.5)
nRBC: 0 % (ref 0.0–0.2)

## 2020-05-11 LAB — C-REACTIVE PROTEIN: CRP: 17.2 mg/dL — ABNORMAL HIGH (ref <1.0)

## 2020-05-11 LAB — D-DIMER, QUANTITATIVE: D-Dimer, Quant: 0.62 ug/mL-FEU — ABNORMAL HIGH (ref 0.00–0.50)

## 2020-05-11 LAB — MAGNESIUM: Magnesium: 2.2 mg/dL (ref 1.7–2.4)

## 2020-05-11 LAB — FERRITIN: Ferritin: 428 ng/mL — ABNORMAL HIGH (ref 24–336)

## 2020-05-11 LAB — PHOSPHORUS: Phosphorus: 2.3 mg/dL — ABNORMAL LOW (ref 2.5–4.6)

## 2020-05-11 LAB — GLUCOSE, CAPILLARY: Glucose-Capillary: 148 mg/dL — ABNORMAL HIGH (ref 70–99)

## 2020-05-11 MED ORDER — POLYETHYLENE GLYCOL 3350 17 G PO PACK
17.0000 g | PACK | Freq: Every day | ORAL | Status: DC
  Administered 2020-05-11 – 2020-05-13 (×3): 17 g via ORAL
  Filled 2020-05-11 (×3): qty 1

## 2020-05-11 NOTE — Evaluation (Signed)
Occupational Therapy Evaluation Patient Details Name: Christopher Murphy MRN: [id number] DOB: 1945-06-23 Today's Date: 05/11/2020    History of Present Illness Pt is 75 y.o. male with medical history significant of remote CVA with right-sided weakness, advanced dementia, GERD, presented with fever cough and increasing shortness of breath.  Pt admitted with COVID 19 PNE, he is vaccinated and boosted.   Clinical Impression   Pt admitted with above. He demonstrates the below listed deficits and will benefit from continued OT to maximize safety and independence with BADLs.  Pt presents to OT with generalized weakness, decreased balance, decreased activity tolerance, and history of cognitive impairment.  He currently requires set up assist - mod A for ADLs, and min - mod A for functional mobility.  Pt lived with family, who provided 24 hour assist.  He was able to ambulate without AD mod I, and required min A for ADLs.  Recommend HHOT at discharge.       Follow Up Recommendations  Home health OT;Supervision/Assistance - 24 hour    Equipment Recommendations  Tub/shower seat    Recommendations for Other Services       Precautions / Restrictions Precautions Precautions: Fall      Mobility Bed Mobility Overal bed mobility: Needs Assistance Bed Mobility: Supine to Sit;Sit to Supine     Supine to sit: Min assist Sit to supine: Min assist   General bed mobility comments: increased time and multimodal cues to complete transfers    Transfers Overall transfer level: Needs assistance Equipment used: 1 person hand held assist Transfers: Sit to/from [facility] Sit to Stand: Min assist Stand pivot transfers: Mod assist       General transfer comment: min A to steady.  Mod A for balance as he fatigues.  Heavy Lt lateral lean    Balance Overall balance assessment: Needs assistance Sitting-balance support: No upper extremity supported;Feet supported Sitting  balance-Leahy Scale: Fair Sitting balance - Comments: able to maintain static sitting.  Looses balance to the left when donning socks   Standing balance support: Bilateral upper extremity supported Standing balance-Leahy Scale: Poor Standing balance comment: requires UE support                           ADL either performed or assessed with clinical judgement   ADL Overall ADL's : Needs assistance/impaired Eating/Feeding: Set up;Supervision/ safety;Bed level   Grooming: Wash/dry hands;Wash/dry face;Oral care;Minimal assistance;Standing Grooming Details (indicate cue type and reason): Lt lateral lean Upper Body Bathing: Minimal assistance;Sitting   Lower Body Bathing: Minimal assistance;Sit to/from stand   Upper Body Dressing : Minimal assistance;Sitting   Lower Body Dressing: Minimal assistance;Sit to/from stand   Toilet Transfer: Minimal assistance;Ambulation;BSC   Toileting- Clothing Manipulation and Hygiene: Moderate assistance;Sit to/from stand       Functional mobility during ADLs: Moderate assistance General ADL Comments: As pt fatigued, he required mod A to ambulate back to the bed.     Vision   Additional Comments: unable ot accurately assess     Perception     Praxis      Pertinent Vitals/Pain Pain Assessment: No/denies pain     Hand Dominance Right   Extremity/Trunk Assessment Upper Extremity Assessment Upper Extremity Assessment: Generalized weakness;RUE deficits/detail;LUE deficits/detail RUE Deficits / Details: bil. UEs tremulous RUE Coordination: decreased fine motor LUE Deficits / Details: bil. UEs tremulous LUE Coordination: decreased fine motor   Lower Extremity Assessment Lower Extremity Assessment: Generalized weakness   Cervical /  Trunk Assessment Cervical / Trunk Assessment: Kyphotic   Communication Communication Communication: Interpreter utilized;Prefers language other than English;Other (comment) (video interpreter  utilized)   Cognition Arousal/Alertness: Awake/alert Behavior During Therapy: WFL for tasks assessed/performed Overall Cognitive Status: No family/caregiver present to determine baseline cognitive functioning Area of Impairment: Orientation;Attention;Following commands;Safety/judgement;Awareness;Problem solving                 Orientation Level: Disoriented to;Place;Time Current Attention Level: Sustained   Following Commands: Follows one step commands with increased time Safety/Judgement: Decreased awareness of deficits Awareness: Emergent Problem Solving: Requires tactile cues General Comments: cognition very difficult to accurately assess due to [facility] and pt is non [place] speaking.  He follows commands with cues.  He demonstrates decreased safety awareness.   General Comments  VSS on RA    Exercises     Shoulder Instructions      Home Living Family/patient expects to be discharged to:: Private residence Living Arrangements: Children Available Help at Discharge: Family;Available 24 hours/day Type of Home: House Home Access: Ramped entrance     Home Layout: One level     Bathroom Shower/Tub: Occupational psychologist: Standard Bathroom Accessibility: Yes   Home Equipment: Cane - single point;Bedside commode   Additional Comments: Pt reports lives with children and has 24 hr supervision.  He was unable to answer home environment questions but they were available from admission 6 months ago.      Prior Functioning/Environment Level of Independence: Needs assistance  Gait / Transfers Assistance Needed: Pt reports ambulates without AD; per prior admission 6 months ago he ambulated 1 mile a day and rarely used cane ADL's / Homemaking Assistance Needed: Pt reports wife helps some - unable to specify how   Comments: PT spoke with family who reports they are able to provide 24 hour assist as needed        OT Problem List: Decreased strength;Decreased  activity tolerance;Impaired balance (sitting and/or standing);Decreased coordination;Decreased cognition;Decreased safety awareness;Decreased knowledge of use of DME or AE      OT Treatment/Interventions: Self-care/ADL training;Neuromuscular education;DME and/or AE instruction;Therapeutic activities;Cognitive remediation/compensation;Patient/family education;Balance training    OT Goals(Current goals can be found in the care plan section) Acute Rehab OT Goals Patient Stated Goal: return home "but they are keeping me here" OT Goal Formulation: With patient Time For Goal Achievement: 05/25/20 Potential to Achieve Goals: Good ADL Goals Pt Will Perform Grooming: with min guard assist;standing Pt Will Perform Lower Body Bathing: with min guard assist;sit to/from stand Pt Will Perform Lower Body Dressing: with min guard assist;sit to/from stand Pt Will Transfer to Toilet: with min guard assist;ambulating;regular height toilet;bedside commode;grab bars Pt Will Perform Toileting - Clothing Manipulation and hygiene: with min guard assist;sit to/from stand  OT Frequency: Min 2X/week   Barriers to D/C:            Co-evaluation              AM-PAC OT "6 Clicks" Daily Activity     Outcome Measure Help from another person eating meals?: A Little Help from another person taking care of personal grooming?: A Little Help from another person toileting, which includes using toliet, bedpan, or urinal?: A Lot Help from another person bathing (including washing, rinsing, drying)?: A Little Help from another person to put on and taking off regular upper body clothing?: A Little Help from another person to put on and taking off regular lower body clothing?: A Little 6 Click Score: 17   End  of Session Equipment Utilized During Treatment: Gait belt Nurse Communication: Mobility status  Activity Tolerance: Patient limited by fatigue Patient left: in bed;with call bell/phone within reach;with bed  alarm set  OT Visit Diagnosis: Unsteadiness on feet (R26.81);Cognitive communication deficit (R41.841)                Time: 5465-6812 OT Time Calculation (min): 39 min Charges:  OT General Charges $OT Visit: 1 Visit OT Evaluation $OT Eval Moderate Complexity: 1 Mod OT Treatments $Self Care/Home Management : 23-37 mins  Nilsa Nutting., OTR/L Acute Rehabilitation Services Pager [phone] Office [place], [place] 05/11/2020, 6:13 PM

## 2020-05-11 NOTE — Progress Notes (Addendum)
PROGRESS NOTE                                                                             PROGRESS NOTE                                                                                                                                                                                                             Patient Demographics:    Christopher Murphy, is a 75 y.o. male, DOB - 05-Dec-1945, [id number]  Outpatient Primary MD for the patient is [name], NP    LOS - 2  Admit date - 05/09/2020    Chief Complaint  Patient presents with  . AMS  . Suspected COVID       Brief Narrative   75 y.o. male with medical history significant of remote CVA with right-sided weakness, advanced dementia, GERD, presented with fever cough and increasing shortness of breath, weakness/malaise-found to have COVID-19 pneumonia and admitted for further treatment and evaluation.  Significant studies: 1/31>> CT head: No acute intracranial abnormality 1/31>> CT chest: Bilateral lower lobe consolidation-debris in central airway  Microbiology: 1/31>> blood culture: No growth 2/1>> urine culture no growth  COVID-19 medications: Remdesivir: 1/31>> Steroids: 1/31>>  Antibiotics: Unasyn: 1/31>>  DVT prophylaxis Prophylactic Lovenox   Subjective:   Answering simple questions appropriately-no major issues overnight.   Assessment  & Plan :   Acute hypoxemic respiratory failure due to COVID-19 pneumonia/aspiration pneumonia: O2 saturations in low 90s-remains on steroid/Remdesivir and Unasyn.  All cultures remain negative so far.  Appreciate SLP evaluation  Sepsis physiology not present on admission-sepsis ruled out.  COVID-19 Labs  Recent Labs    05/09/20 1232 05/10/20 0211 05/11/20 0210  DDIMER 0.85* 0.70* 0.62*  FERRITIN 362* 333 428*  LDH 137  --   --   CRP 15.7* 23.1* 17.2*    Lab Results  Component Value Date   SARSCOV2NAA NEGATIVE 10/28/2019    [place] NEGATIVE 04/27/2019   [place] Not Detected 02/16/2019    Acute urinary retention: Foley catheter  placed on 2/1-continue Flomax-we will attempt voiding trial in the next few days.  HLD: Continue statin  Dementia: Pleasantly confused-continue Aricept/Namenda  History of CVA: Nonfocal exam-continue aspirin/statin   GERD: Continue PPI  Thoracic aortic aneurysm: Incidentally seen on noncontrast CT chest-needs outpatient evaluation with CTA/MRA.  Mediastinal lymphadenopathy: Incidentally seen on CT chest-likely reactive-needs repeat imaging in 6 to 12 weeks.   SpO2: 93 % O2 Flow Rate (L/min): 1 L/min  Recent Labs  Lab 05/09/20 1232 05/10/20 0211 05/11/20 0210  WBC 8.9 11.4* 17.9*  PLT 388 362 406*  CRP 15.7* 23.1* 17.2*  DDIMER 0.85* 0.70* 0.62*  PROCALCITON 0.13  --   --   AST 36 22 28  ALT 42 33 30  ALKPHOS 103 81 82  BILITOT 0.8 0.8 0.6  ALBUMIN 2.6* 2.0* 2.0*  INR 1.2  --   --   LATICACIDVEN 1.4  --   --        ABG  No results found for: PHART, PCO2ART, PO2ART, HCO3, TCO2, ACIDBASEDEF, O2SAT    Condition -Guarded  Family Communication  :  Daughter [name]- [phone] on 2/2  Code Status :  Full  Consults  :  none  Disposition Plan  : Home with home health services versus SNF-await PT/OT eval.  Status is: Inpatient  Remains inpatient appropriate because:IV treatments appropriate due to intensity of illness or inability to take PO  Dispo: The patient is from: Home              Anticipated d/c is to: Home              Anticipated d/c date is: 3 days              Patient currently is not medically stable to d/c.   Difficult to place patient No    Diet :  Diet Order            Diet vegetarian Room service appropriate? No; Fluid consistency: Thin  Diet effective now                  Inpatient Medications  Scheduled Meds: . aspirin EC  81 mg Oral Q0600  . Chlorhexidine Gluconate Cloth  6 each Topical Daily  . donepezil   10 mg Oral QHS  . enoxaparin (LOVENOX) injection  40 mg Subcutaneous Q24H  . fluticasone  1-2 spray Each Nare Daily  . folic acid  1 mg Oral Daily  . Ipratropium-Albuterol  1 puff Inhalation Q6H  . loratadine  10 mg Oral Daily  . memantine  10 mg Oral BID  . methylPREDNISolone (SOLU-MEDROL) injection  40 mg Intravenous Q12H   Followed by  . [START ON 05/12/2020] predniSONE  50 mg Oral Daily  . pantoprazole  40 mg Oral Daily  . polyvinyl alcohol  1 drop Both Eyes BID  . potassium chloride  40 mEq Oral Once  . rosuvastatin  20 mg Oral Daily  . tamsulosin  0.4 mg Oral QPC breakfast   Continuous Infusions: . ampicillin-sulbactam (UNASYN) IV Stopped (05/11/20 1205)  . remdesivir 100 mg in NS 100 mL Stopped (05/11/20 1028)   PRN Meds:.acetaminophen, benzonatate, ondansetron **OR** ondansetron (ZOFRAN) IV, senna-docusate  Antibiotics  :    Anti-infectives (From admission, onward)   Start     Dose/Rate Route Frequency Ordered Stop   05/10/20 1000  remdesivir 100 mg in sodium chloride 0.9 % 100 mL IVPB       "Followed by" Linked Group Details  100 mg 200 mL/hr over 30 Minutes Intravenous Daily 05/09/20 1315 05/14/20 0959   05/09/20 1700  Ampicillin-Sulbactam (UNASYN) 3 g in sodium chloride 0.9 % 100 mL IVPB        3 g 200 mL/hr over 30 Minutes Intravenous Every 6 hours 05/09/20 1652     05/09/20 1500  remdesivir 200 mg in sodium chloride 0.9% 250 mL IVPB       "Followed by" Linked Group Details   200 mg 580 mL/hr over 30 Minutes Intravenous Once 05/09/20 1315 05/09/20 2356        Oren Binet M.D on 05/11/2020 at 2:08 PM  To page go to [email or web address]   [facility] -  Office  [phone]    Objective:   Vitals:   05/10/20 1643 05/10/20 2035 05/11/20 0427 05/11/20 1210  BP: 134/74 111/65 123/70 112/63  Pulse: 91 85 74 65  Resp: (!) 24 17 18  (!) 21  Temp: 98.3 F (36.8 C) (!) 97.4 F (36.3 C) 97.6 F (36.4 C) 97.7 F (36.5 C)  TempSrc: Oral Oral Axillary Oral   SpO2: 93% 94% 94% 93%  Weight:      Height:        Wt Readings from Last 3 Encounters:  05/09/20 66.2 kg  02/25/20 66.2 kg  02/08/20 66.6 kg     Intake/Output Summary (Last 24 hours) at 05/11/2020 1408 Last data filed at 05/11/2020 1205 Gross per 24 hour  Intake 757.41 ml  Output 775 ml  Net -17.59 ml     Physical Exam Gen Exam:Alert awake-not in any distress HEENT:atraumatic, normocephalic Chest: B/L clear to auscultation anteriorly CVS:S1S2 regular Abdomen:soft non tender, non distended Extremities:no edema Neurology: Non focal Skin: no rash      Data Review:    CBC Recent Labs  Lab 05/09/20 1232 05/10/20 0211 05/11/20 0210  WBC 8.9 11.4* 17.9*  HGB 12.9* 11.4* 10.7*  HCT 38.8* 32.8* 30.4*  PLT 388 362 406*  MCV 79.7* 77.9* 78.4*  MCH 26.5 27.1 27.6  MCHC 33.2 34.8 35.2  RDW 13.0 13.2 13.3  LYMPHSABS 0.6* 0.5* 0.9  MONOABS 0.5 0.0* 0.7  EOSABS 0.0 0.0 0.0  BASOSABS 0.0 0.0 0.0    Recent Labs  Lab 05/09/20 1232 05/09/20 1831 05/10/20 0211 05/11/20 0210  NA 136  --  136 137  K 3.1*  --  3.2* 4.2  CL 99  --  103 108  CO2 25  --  23 20*  GLUCOSE 106*  --  147* 150*  BUN 7*  --  10 21  CREATININE 0.84  --  0.71 0.76  CALCIUM 8.6*  --  8.2* 8.2*  AST 36  --  22 28  ALT 42  --  33 30  ALKPHOS 103  --  81 82  BILITOT 0.8  --  0.8 0.6  ALBUMIN 2.6*  --  2.0* 2.0*  MG  --  1.9 1.9 2.2  CRP 15.7*  --  23.1* 17.2*  DDIMER 0.85*  --  0.70* 0.62*  PROCALCITON 0.13  --   --   --   LATICACIDVEN 1.4  --   --   --   INR 1.2  --   --   --     ------------------------------------------------------------------------------------------------------------------ Recent Labs    05/09/20 1232  TRIG 76    Lab Results  Component Value Date   HGBA1C 5.3 10/29/2019   ------------------------------------------------------------------------------------------------------------------ No results for input(s): TSH, T4TOTAL, T3FREE, THYROIDAB in the last 72  hours.  Invalid input(s): FREET3  Cardiac Enzymes No results for input(s): CKMB, TROPONINI, MYOGLOBIN in the last 168 hours.  Invalid input(s): CK ------------------------------------------------------------------------------------------------------------------ No results found for: BNP  Micro Results Recent Results (from the past 240 hour(s))  Blood Culture (routine x 2)     Status: None (Preliminary result)   Collection Time: 05/09/20 12:31 PM   Specimen: BLOOD RIGHT ARM  Result Value Ref Range Status   Specimen Description BLOOD RIGHT ARM  Final   Special Requests   Final    BOTTLES DRAWN AEROBIC AND ANAEROBIC Blood Culture adequate volume   Culture   Final    NO GROWTH 2 DAYS Performed at [facility], [address], [place], [place] [address]    Report Status PENDING  Incomplete  Blood Culture (routine x 2)     Status: None (Preliminary result)   Collection Time: 05/09/20 12:32 PM   Specimen: BLOOD LEFT ARM  Result Value Ref Range Status   Specimen Description BLOOD LEFT ARM  Final   Special Requests   Final    BOTTLES DRAWN AEROBIC AND ANAEROBIC Blood Culture adequate volume   Culture   Final    NO GROWTH 2 DAYS Performed at [facility], [place] [address], [place], [place] [address]    Report Status PENDING  Incomplete  Urine culture     Status: None   Collection Time: 05/10/20  5:12 AM   Specimen: In/Out Cath Urine  Result Value Ref Range Status   Specimen Description IN/OUT CATH URINE  Final   Special Requests NONE  Final   Culture   Final    NO GROWTH Performed at [facility], [place] [address] [place], [place] [address]    Report Status 05/11/2020 FINAL  Final    Radiology Reports CT Head Wo Contrast  Result Date: 05/09/2020 CLINICAL DATA:  Altered mental status. EXAM: CT HEAD WITHOUT CONTRAST TECHNIQUE: Contiguous axial images were obtained from the base of the skull through the vertex without intravenous contrast. COMPARISON:   10/28/2019 head CT and brain MR. FINDINGS: Brain: Advanced for age cerebral atrophy. Severe low density in the periventricular white matter likely related to small vessel disease. Remote left PCA distribution infarct. No mass lesion, hemorrhage, hydrocephalus, acute infarct, intra-axial, or extra-axial fluid collection. Vascular: No hyperdense vessel or unexpected calcification. Intracranial atherosclerosis. Skull: No significant soft tissue swelling.  No skull fracture. Sinuses/Orbits: Normal imaged portions of the orbits and globes. Mucosal thickening ethmoid air cells. Clear mastoid air cells. Other: None. IMPRESSION: 1. No acute intracranial abnormality. 2. Cerebral atrophy and small vessel ischemic change. 3. Remote left PCA distribution infarct. 4. Sinus disease. Electronically Signed   By: Abigail Miyamoto M.D.   On: 05/09/2020 13:18   CT CHEST WO CONTRAST  Result Date: 05/09/2020 CLINICAL DATA:  Aspiration, COVID pneumonia EXAM: CT CHEST WITHOUT CONTRAST TECHNIQUE: Multidetector CT imaging of the chest was performed following the standard protocol without IV contrast. COMPARISON:  10/29/2019 FINDINGS: Cardiovascular: Moderate coronary artery calcification. Global cardiac size within normal limits. Small pericardial effusion is present, stable since prior examination. Central pulmonary arteries are of normal caliber. The thoracic aorta is mildly dilated measuring 4.0 cm in its ascending segment and 3.5 cm in its proximal descending segment. The aorta measures 2.8 cm in diameter at the level of the left atrium. This appears slightly progressive since prior examination where this measured 3.6 cm and 3.0 cm, respectively. Mediastinum/Nodes: Pathologic aortopulmonary window lymphadenopathy is again identified and appears  progressive measuring 2.1 x 3.4 cm at axial image # 53/3. Several shotty precarinal lymph nodes are identified. No additional pathologic thoracic adenopathy is seen. Thyroid unremarkable.  Esophagus unremarkable. Lungs/Pleura: There is bilateral lower lobe depending consolidation as well as airspace infiltrate within the posterior segment of the right upper lobe. Additionally, there is debris seen within the right mainstem bronchus. Together, the findings are suspicious for changes of aspiration pneumonia. There is associated bronchial wall thickening in keeping with airway inflammation. No pneumothorax or pleural effusion. Upper Abdomen: Unremarkable Musculoskeletal: No acute bone abnormality. IMPRESSION: Interval development of bilateral dependent lower lobe consolidation and focal infiltrate within the posterior basal segment of the right upper lobe most in keeping with changes of aspiration pneumonia. Debris seen within the central airways without evidence of central obstructing lesion. Moderate coronary artery calcification. Progressive dilation of the thoracic aorta. This would be better assessed with dedicated CT arteriography once the patient's acute issues have resolved. If confirmed, recommend annual imaging followup by CTA or MRA. This recommendation follows 2010 ACCF/AHA/AATS/ACR/ASA/SCA/SCAI/SIR/STS/SVM Guidelines for the Diagnosis and Management of Patients with Thoracic Aortic Disease. Circulation. 2010; 121: K599-J570. Aortic aneurysm NOS (ICD10-I71.9) Progressive pathologic aortopulmonary window lymphadenopathy, nonspecific. This could be re-evaluated a CT imaging in 6-12 weeks once the patient's acute issues have resolved. Alternatively, metabolic activity could be assessed with PET CT examination, again when the patient's acute issues have resolved. Aortic Atherosclerosis (ICD10-I70.0). Electronically Signed   By: Fidela Salisbury MD   On: 05/09/2020 16:35   DG Chest Port 1 View  Result Date: 05/09/2020 CLINICAL DATA:  Suspected COVID.  Altered mental status. EXAM: PORTABLE CHEST 1 VIEW COMPARISON:  June 12, 2019. FINDINGS: Bibasilar airspace opacities. No visible pleural  effusions or pneumothorax. Similar cardiomediastinal silhouette with tortuous aorta. No acute osseous abnormality. IMPRESSION: Bibasilar airspace opacities, concerning for pneumonia. Electronically Signed   By: Margaretha Sheffield MD   On: 05/09/2020 12:19

## 2020-05-11 NOTE — Evaluation (Addendum)
Physical Therapy Evaluation Patient Details Name: Christopher Murphy MRN: [id number] DOB: 06/28/1945 Today's Date: 05/11/2020   History of Present Illness  Pt is 75 y.o. male with medical history significant of remote CVA with right-sided weakness, advanced dementia, GERD, presented with fever cough and increasing shortness of breath.  Pt admitted with COVID 19 PNE, he is vaccinated and boosted.  Clinical Impression  Pt admitted with above diagnosis. Pt has hx of advanced dementia and also speaks Punjabi - interpretor utilized but pt still with some difficulty answering all questions.  He required min A for transfers and to ambulate 50'.  He was unsteady with ambulation and required frequent cues for safety.  Per recent admission (6 months ago) pt was ambulating in community.  Pt expressing desire to return home and has 24 hr support.  If home will need assist with transfers and gait, if this is not available then may need SNF.  Pt tolerated activity on RA and is expected to progress well.  Pt currently with functional limitations due to the deficits listed below (see PT Problem List). Pt will benefit from skilled PT to increase their independence and safety with mobility to allow discharge to the venue listed below.   *Later spoke with daughter in law who confirmed pt is ambulatory without AD and needs min A with ADLs.  She reports he has 24 hr support and they would like him home if possible.  States they can provide physical assistance for transfers and walking for safety.  Interested in [place].  Did discuss w/c if pt remains unsteady, but she states would rather do walker if at all possible.  He also has a BSC.     Follow Up Recommendations Home health PT;Supervision/Assistance - 24 hour (dtr in law confirmed 24 hr support)    Equipment Recommendations  Rolling walker with 5" wheels    Recommendations for Other Services       Precautions / Restrictions Precautions Precautions: Fall       Mobility  Bed Mobility Overal bed mobility: Needs Assistance Bed Mobility: Supine to Sit;Sit to Supine     Supine to sit: Min assist Sit to supine: Min assist   General bed mobility comments: increased time and multimodal cues to complete transfers    Transfers Overall transfer level: Needs assistance Equipment used: Rolling walker (2 wheeled) Transfers: Sit to/from Stand Sit to Stand: Min assist         General transfer comment: Min A to steady  Ambulation/Gait Ambulation/Gait assistance: Herbalist (Feet): 50 Feet Assistive device: Rolling walker (2 wheeled) Gait Pattern/deviations: Step-to pattern;Decreased stride length Gait velocity: decreased   General Gait Details: Unsteady requiring min A for balance - some limitations due to communication barrier/dementia, and lines/leads; pt fatigued easily; required multimodal cues  Stairs            Wheelchair Mobility    Modified Rankin (Stroke Patients Only)       Balance Overall balance assessment: Needs assistance Sitting-balance support: No upper extremity supported Sitting balance-Leahy Scale: Good     Standing balance support: Bilateral upper extremity supported Standing balance-Leahy Scale: Poor Standing balance comment: requiring RW                             Pertinent Vitals/Pain Pain Assessment: No/denies pain    Home Living Family/patient expects to be discharged to:: Private residence Living Arrangements: Children Available Help at Discharge: Family;Available 24  hours/day Type of Home: House Home Access: Ramped entrance     Home Layout: One level Home Equipment: [place] - single point Additional Comments: Pt reports lives with children and has 24 hr supervision.  He was unable to answer home environment questions but they were available from admission 6 months ago.    Prior Function Level of Independence: Needs assistance   Gait / Transfers Assistance  Needed: Pt reports ambulates without AD; per prior admission 6 months ago he ambulated 1 mile a day and rarely used cane  ADL's / Homemaking Assistance Needed: Pt reports wife helps some - unable to specify how        Hand Dominance   Dominant Hand: Right    Extremity/Trunk Assessment   Upper Extremity Assessment Upper Extremity Assessment: Overall WFL for tasks assessed;Difficult to assess due to impaired cognition    Lower Extremity Assessment Lower Extremity Assessment: Overall WFL for tasks assessed;Difficult to assess due to impaired cognition    Cervical / Trunk Assessment Cervical / Trunk Assessment: Kyphotic  Communication   Communication: Interpreter utilized;Prefers language other than English;Other (comment) (Interpretor ID [phone] - difficult to understand pt at times, he would also switch between [place] and [place]; pt also w dementia)  Cognition Arousal/Alertness: Awake/alert Behavior During Therapy: WFL for tasks assessed/performed Overall Cognitive Status: No family/caregiver present to determine baseline cognitive functioning Area of Impairment: Orientation;Attention;Following commands;Safety/judgement;Awareness;Problem solving                 Orientation Level: Disoriented to;Place;Time Current Attention Level: Sustained   Following Commands: Follows one step commands with increased time Safety/Judgement: Decreased awareness of deficits Awareness: Emergent Problem Solving: Requires tactile cues        General Comments General comments (skin integrity, edema, etc.): Pt on RA with sats >93%    Exercises     Assessment/Plan    PT Assessment Patient needs continued PT services  PT Problem List Decreased strength;Decreased mobility;Decreased safety awareness;Decreased coordination;Decreased activity tolerance;Decreased cognition;Cardiopulmonary status limiting activity;Decreased balance;Decreased knowledge of use of DME       PT Treatment  Interventions DME instruction;Therapeutic activities;Gait training;Therapeutic exercise;Patient/family education;Balance training;Functional mobility training    PT Goals (Current goals can be found in the Care Plan section)  Acute Rehab PT Goals Patient Stated Goal: return home "but they are keeping me here" PT Goal Formulation: With patient Time For Goal Achievement: 05/25/20 Potential to Achieve Goals: Good    Frequency Min 3X/week   Barriers to discharge        Co-evaluation               AM-PAC PT "6 Clicks" Mobility  Outcome Measure Help needed turning from your back to your side while in a flat bed without using bedrails?: A Little Help needed moving from lying on your back to sitting on the side of a flat bed without using bedrails?: A Little Help needed moving to and from a bed to a chair (including a wheelchair)?: A Little Help needed standing up from a chair using your arms (e.g., wheelchair or bedside chair)?: A Little Help needed to walk in hospital room?: A Little Help needed climbing 3-5 steps with a railing? : A Lot 6 Click Score: 17    End of Session Equipment Utilized During Treatment: Gait belt Activity Tolerance: Patient tolerated treatment well Patient left: in bed;with bed alarm set;with call bell/phone within reach Nurse Communication: Mobility status PT Visit Diagnosis: Unsteadiness on feet (R26.81);Muscle weakness (generalized) (M62.81)    Time:  [id number] PT Time Calculation (min) (ACUTE ONLY): 28 min   Charges:   PT Evaluation $PT Eval Moderate Complexity: 1 Mod PT Treatments $Gait Training: 8-22 mins        Abran Richard, PT Acute Rehab Services Pager [phone] [facility] [place] 05/11/2020, 4:39 PM

## 2020-05-12 LAB — COMPREHENSIVE METABOLIC PANEL
ALT: 28 U/L (ref 0–44)
AST: 25 U/L (ref 15–41)
Albumin: 1.9 g/dL — ABNORMAL LOW (ref 3.5–5.0)
Alkaline Phosphatase: 64 U/L (ref 38–126)
Anion gap: 8 (ref 5–15)
BUN: 20 mg/dL (ref 8–23)
CO2: 21 mmol/L — ABNORMAL LOW (ref 22–32)
Calcium: 7.9 mg/dL — ABNORMAL LOW (ref 8.9–10.3)
Chloride: 107 mmol/L (ref 98–111)
Creatinine, Ser: 0.65 mg/dL (ref 0.61–1.24)
GFR, Estimated: >60 mL/min (ref >60)
Glucose, Bld: 151 mg/dL — ABNORMAL HIGH (ref 70–99)
Potassium: 3.7 mmol/L (ref 3.5–5.1)
Sodium: 136 mmol/L (ref 135–145)
Total Bilirubin: 0.3 mg/dL (ref 0.3–1.2)
Total Protein: 5.3 g/dL — ABNORMAL LOW (ref 6.5–8.1)

## 2020-05-12 LAB — CBC WITH DIFFERENTIAL/PLATELET
Abs Immature Granulocytes: 0.1 10*3/uL — ABNORMAL HIGH (ref 0.00–0.07)
Basophils Absolute: 0 10*3/uL (ref 0.0–0.1)
Basophils Relative: 0 %
Eosinophils Absolute: 0 10*3/uL (ref 0.0–0.5)
Eosinophils Relative: 0 %
HCT: 31.8 % — ABNORMAL LOW (ref 39.0–52.0)
Hemoglobin: 10.8 g/dL — ABNORMAL LOW (ref 13.0–17.0)
Immature Granulocytes: 1 %
Lymphocytes Relative: 6 %
Lymphs Abs: 0.8 10*3/uL (ref 0.7–4.0)
MCH: 26.7 pg (ref 26.0–34.0)
MCHC: 34 g/dL (ref 30.0–36.0)
MCV: 78.7 fL — ABNORMAL LOW (ref 80.0–100.0)
Monocytes Absolute: 0.5 10*3/uL (ref 0.1–1.0)
Monocytes Relative: 4 %
Neutro Abs: 12.4 10*3/uL — ABNORMAL HIGH (ref 1.7–7.7)
Neutrophils Relative %: 89 %
Platelets: 420 10*3/uL — ABNORMAL HIGH (ref 150–400)
RBC: 4.04 MIL/uL — ABNORMAL LOW (ref 4.22–5.81)
RDW: 13.2 % (ref 11.5–15.5)
WBC: 13.7 10*3/uL — ABNORMAL HIGH (ref 4.0–10.5)
nRBC: 0 % (ref 0.0–0.2)

## 2020-05-12 LAB — PHOSPHORUS: Phosphorus: 2.8 mg/dL (ref 2.5–4.6)

## 2020-05-12 LAB — FERRITIN: Ferritin: 410 ng/mL — ABNORMAL HIGH (ref 24–336)

## 2020-05-12 LAB — MAGNESIUM: Magnesium: 2.2 mg/dL (ref 1.7–2.4)

## 2020-05-12 LAB — C-REACTIVE PROTEIN: CRP: 9.2 mg/dL — ABNORMAL HIGH (ref <1.0)

## 2020-05-12 LAB — D-DIMER, QUANTITATIVE: D-Dimer, Quant: 0.58 ug/mL-FEU — ABNORMAL HIGH (ref 0.00–0.50)

## 2020-05-12 NOTE — Progress Notes (Signed)
PROGRESS NOTE                                                                             PROGRESS NOTE                                                                                                                                                                                                             Patient Demographics:    Christopher Murphy, is a 75 y.o. male, DOB - 07/17/1945, [id number]  Outpatient Primary MD for the patient is [name], NP    LOS - 3  Admit date - 05/09/2020    Chief Complaint  Patient presents with  . AMS  . Suspected COVID       Brief Narrative   75 y.o. male with medical history significant of remote CVA with right-sided weakness, advanced dementia, GERD, presented with fever cough and increasing shortness of breath, weakness/malaise-found to have COVID-19 pneumonia and admitted for further treatment and evaluation.  Significant studies: 1/31>> CT head: No acute intracranial abnormality 1/31>> CT chest: Bilateral lower lobe consolidation-debris in central airway  Microbiology: 1/31>> blood culture: No growth 2/1>> urine culture no growth  COVID-19 medications: Remdesivir: 1/31>> Steroids: 1/31>>  Antibiotics: Unasyn: 1/31>>  DVT prophylaxis Prophylactic Lovenox   Subjective:   Lying comfortably in bed-no major issues overnight.  Denies any chest pain.   Assessment  & Plan :   Acute hypoxemic respiratory failure due to COVID-19 pneumonia/aspiration pneumonia: Slowly improving-continues to have some coughing spells but not hypoxic-CRP is finally downtrending.  Cultures remain negative so far.  On steroids/Remdesivir for Covid-and on empiric Unasyn for aspiration pneumonia.  Appreciate SLP evaluation.  Suspect that if clinical improvement continues-potential discharge in the next 1-day on oral antimicrobial therapy.  Sepsis physiology not present on admission-sepsis ruled out.  COVID-19  Labs  Recent Labs    05/10/20 0211 05/11/20 0210 05/12/20 0104  DDIMER 0.70* 0.62* 0.58*  FERRITIN 333 428* 410*  CRP 23.1* 17.2* 9.2*    Lab Results  Component  Value Date   SARSCOV2NAA NEGATIVE 10/28/2019   [place] NEGATIVE 04/27/2019   [place] Not Detected 02/16/2019    Acute urinary retention: Foley catheter placed on 2/1-continue Flomax (noncompliant at home per daughter)-we will attempt voiding trial tomorrow.  HLD: Continue statin  Dementia: Pleasantly confused-continue Aricept/Namenda  History of CVA: Nonfocal exam-continue aspirin/statin   GERD: Continue PPI  Thoracic aortic aneurysm: Incidentally seen on noncontrast CT chest-needs outpatient evaluation with CTA/MRA.  Mediastinal lymphadenopathy: Incidentally seen on CT chest-likely reactive-needs repeat imaging in 6 to 12 weeks.   SpO2: 91 % O2 Flow Rate (L/min): 1 L/min  Recent Labs  Lab 05/09/20 1232 05/10/20 0211 05/11/20 0210 05/12/20 0104  WBC 8.9 11.4* 17.9* 13.7*  PLT 388 362 406* 420*  CRP 15.7* 23.1* 17.2* 9.2*  DDIMER 0.85* 0.70* 0.62* 0.58*  PROCALCITON 0.13  --   --   --   AST 36 22 28 25   ALT 42 33 30 28  ALKPHOS 103 81 82 64  BILITOT 0.8 0.8 0.6 0.3  ALBUMIN 2.6* 2.0* 2.0* 1.9*  INR 1.2  --   --   --   LATICACIDVEN 1.4  --   --   --        ABG  No results found for: PHART, PCO2ART, PO2ART, HCO3, TCO2, ACIDBASEDEF, O2SAT    Condition -Guarded  Family Communication  :  Daughter [name]- [phone] on 2/3  Code Status :  Full  Consults  :  none  Disposition Plan  : Home with home health  Status is: Inpatient  Remains inpatient appropriate because:IV treatments appropriate due to intensity of illness or inability to take PO.  Remains on IV Remdesivir and IV Unasyn  Dispo: The patient is from: Home              Anticipated d/c is to: Home              Anticipated d/c date is: 1 day              Patient currently is not medically stable to d/c.    Difficult to place patient No    Diet :  Diet Order            Diet vegetarian Room service appropriate? No; Fluid consistency: Thin  Diet effective now                  Inpatient Medications  Scheduled Meds: . aspirin EC  81 mg Oral Q0600  . Chlorhexidine Gluconate Cloth  6 each Topical Daily  . donepezil  10 mg Oral QHS  . enoxaparin (LOVENOX) injection  40 mg Subcutaneous Q24H  . fluticasone  1-2 spray Each Nare Daily  . folic acid  1 mg Oral Daily  . Ipratropium-Albuterol  1 puff Inhalation Q6H  . loratadine  10 mg Oral Daily  . memantine  10 mg Oral BID  . pantoprazole  40 mg Oral Daily  . polyethylene glycol  17 g Oral Daily  . polyvinyl alcohol  1 drop Both Eyes BID  . predniSONE  50 mg Oral Daily  . rosuvastatin  20 mg Oral Daily  . tamsulosin  0.4 mg Oral QPC breakfast   Continuous Infusions: . ampicillin-sulbactam (UNASYN) IV 3 g (05/12/20 0455)  . remdesivir 100 mg in NS 100 mL Stopped (05/12/20 1010)   PRN Meds:.acetaminophen, benzonatate, ondansetron **OR** ondansetron (ZOFRAN) IV, senna-docusate  Antibiotics  :    Anti-infectives (From admission, onward)   Start  Dose/Rate Route Frequency Ordered Stop   05/10/20 1000  remdesivir 100 mg in sodium chloride 0.9 % 100 mL IVPB       "Followed by" Linked Group Details   100 mg 200 mL/hr over 30 Minutes Intravenous Daily 05/09/20 1315 05/14/20 0959   05/09/20 1700  Ampicillin-Sulbactam (UNASYN) 3 g in sodium chloride 0.9 % 100 mL IVPB        3 g 200 mL/hr over 30 Minutes Intravenous Every 6 hours 05/09/20 1652     05/09/20 1500  remdesivir 200 mg in sodium chloride 0.9% 250 mL IVPB       "Followed by" Linked Group Details   200 mg 580 mL/hr over 30 Minutes Intravenous Once 05/09/20 1315 05/09/20 2356        Oren Binet M.D on 05/12/2020 at 12:35 PM  To page go to [email or web address]   [facility] -  Office  [phone]    Objective:   Vitals:   05/11/20 1210 05/11/20 1647 05/11/20 2038  05/12/20 0437  BP: 112/63 120/69 116/63 128/66  Pulse: 65 71 81 60  Resp: (!) 21 (!) 21 20 11   Temp: 97.7 F (36.5 C) 97.9 F (36.6 C) 97.7 F (36.5 C) (!) 97 F (36.1 C)  TempSrc: Oral Oral Oral Axillary  SpO2: 93% 94% 93% 91%  Weight:      Height:        Wt Readings from Last 3 Encounters:  05/09/20 66.2 kg  02/25/20 66.2 kg  02/08/20 66.6 kg     Intake/Output Summary (Last 24 hours) at 05/12/2020 1235 Last data filed at 05/12/2020 1010 Gross per 24 hour  Intake 458.46 ml  Output 750 ml  Net -291.54 ml     Physical Exam Gen Exam:Alert awake-not in any distress HEENT:atraumatic, normocephalic Chest: B/L clear to auscultation anteriorly CVS:S1S2 regular Abdomen:soft non tender, non distended Extremities:no edema Neurology: Non focal Skin: no rash    Data Review:    CBC Recent Labs  Lab 05/09/20 1232 05/10/20 0211 05/11/20 0210 05/12/20 0104  WBC 8.9 11.4* 17.9* 13.7*  HGB 12.9* 11.4* 10.7* 10.8*  HCT 38.8* 32.8* 30.4* 31.8*  PLT 388 362 406* 420*  MCV 79.7* 77.9* 78.4* 78.7*  MCH 26.5 27.1 27.6 26.7  MCHC 33.2 34.8 35.2 34.0  RDW 13.0 13.2 13.3 13.2  LYMPHSABS 0.6* 0.5* 0.9 0.8  MONOABS 0.5 0.0* 0.7 0.5  EOSABS 0.0 0.0 0.0 0.0  BASOSABS 0.0 0.0 0.0 0.0    Recent Labs  Lab 05/09/20 1232 05/09/20 1831 05/10/20 0211 05/11/20 0210 05/12/20 0104  NA 136  --  136 137 136  K 3.1*  --  3.2* 4.2 3.7  CL 99  --  103 108 107  CO2 25  --  23 20* 21*  GLUCOSE 106*  --  147* 150* 151*  BUN 7*  --  10 21 20   CREATININE 0.84  --  0.71 0.76 0.65  CALCIUM 8.6*  --  8.2* 8.2* 7.9*  AST 36  --  22 28 25   ALT 42  --  33 30 28  ALKPHOS 103  --  81 82 64  BILITOT 0.8  --  0.8 0.6 0.3  ALBUMIN 2.6*  --  2.0* 2.0* 1.9*  MG  --  1.9 1.9 2.2 2.2  CRP 15.7*  --  23.1* 17.2* 9.2*  DDIMER 0.85*  --  0.70* 0.62* 0.58*  PROCALCITON 0.13  --   --   --   --   LATICACIDVEN 1.4  --   --   --   --  INR 1.2  --   --   --   --      ------------------------------------------------------------------------------------------------------------------ No results for input(s): CHOL, HDL, LDLCALC, TRIG, CHOLHDL, LDLDIRECT in the last 72 hours.  Lab Results  Component Value Date   HGBA1C 5.3 10/29/2019   ------------------------------------------------------------------------------------------------------------------ No results for input(s): TSH, T4TOTAL, T3FREE, THYROIDAB in the last 72 hours.  Invalid input(s): FREET3  Cardiac Enzymes No results for input(s): CKMB, TROPONINI, MYOGLOBIN in the last 168 hours.  Invalid input(s): CK ------------------------------------------------------------------------------------------------------------------ No results found for: BNP  Micro Results Recent Results (from the past 240 hour(s))  Blood Culture (routine x 2)     Status: None (Preliminary result)   Collection Time: 05/09/20 12:31 PM   Specimen: BLOOD RIGHT ARM  Result Value Ref Range Status   Specimen Description BLOOD RIGHT ARM  Final   Special Requests   Final    BOTTLES DRAWN AEROBIC AND ANAEROBIC Blood Culture adequate volume   Culture   Final    NO GROWTH 3 DAYS Performed at [facility], [address] [place], [place] [address]    Report Status PENDING  Incomplete  Blood Culture (routine x 2)     Status: None (Preliminary result)   Collection Time: 05/09/20 12:32 PM   Specimen: BLOOD LEFT ARM  Result Value Ref Range Status   Specimen Description BLOOD LEFT ARM  Final   Special Requests   Final    BOTTLES DRAWN AEROBIC AND ANAEROBIC Blood Culture adequate volume   Culture   Final    NO GROWTH 3 DAYS Performed at [facility], [place] [address], [place], [place] [address]    Report Status PENDING  Incomplete  Urine culture     Status: None   Collection Time: 05/10/20  5:12 AM   Specimen: In/Out Cath Urine  Result Value Ref Range Status   Specimen Description IN/OUT CATH URINE  Final    Special Requests NONE  Final   Culture   Final    NO GROWTH Performed at [facility], [place] [address], [place],  [address]    Report Status 05/11/2020 FINAL  Final    Radiology Reports CT Head Wo Contrast  Result Date: 05/09/2020 CLINICAL DATA:  Altered mental status. EXAM: CT HEAD WITHOUT CONTRAST TECHNIQUE: Contiguous axial images were obtained from the base of the skull through the vertex without intravenous contrast. COMPARISON:  10/28/2019 head CT and brain MR. FINDINGS: Brain: Advanced for age cerebral atrophy. Severe low density in the periventricular white matter likely related to small vessel disease. Remote left PCA distribution infarct. No mass lesion, hemorrhage, hydrocephalus, acute infarct, intra-axial, or extra-axial fluid collection. Vascular: No hyperdense vessel or unexpected calcification. Intracranial atherosclerosis. Skull: No significant soft tissue swelling.  No skull fracture. Sinuses/Orbits: Normal imaged portions of the orbits and globes. Mucosal thickening ethmoid air cells. Clear mastoid air cells. Other: None. IMPRESSION: 1. No acute intracranial abnormality. 2. Cerebral atrophy and small vessel ischemic change. 3. Remote left PCA distribution infarct. 4. Sinus disease. Electronically Signed   By: Abigail Miyamoto M.D.   On: 05/09/2020 13:18   CT CHEST WO CONTRAST  Result Date: 05/09/2020 CLINICAL DATA:  Aspiration, COVID pneumonia EXAM: CT CHEST WITHOUT CONTRAST TECHNIQUE: Multidetector CT imaging of the chest was performed following the standard protocol without IV contrast. COMPARISON:  10/29/2019 FINDINGS: Cardiovascular: Moderate coronary artery calcification. Global cardiac size within normal limits. Small pericardial effusion is present, stable since prior examination. Central pulmonary arteries are of  normal caliber. The thoracic aorta is mildly dilated measuring 4.0 cm in its ascending segment and 3.5 cm in its proximal descending segment. The aorta  measures 2.8 cm in diameter at the level of the left atrium. This appears slightly progressive since prior examination where this measured 3.6 cm and 3.0 cm, respectively. Mediastinum/Nodes: Pathologic aortopulmonary window lymphadenopathy is again identified and appears progressive measuring 2.1 x 3.4 cm at axial image # 53/3. Several shotty precarinal lymph nodes are identified. No additional pathologic thoracic adenopathy is seen. Thyroid unremarkable. Esophagus unremarkable. Lungs/Pleura: There is bilateral lower lobe depending consolidation as well as airspace infiltrate within the posterior segment of the right upper lobe. Additionally, there is debris seen within the right mainstem bronchus. Together, the findings are suspicious for changes of aspiration pneumonia. There is associated bronchial wall thickening in keeping with airway inflammation. No pneumothorax or pleural effusion. Upper Abdomen: Unremarkable Musculoskeletal: No acute bone abnormality. IMPRESSION: Interval development of bilateral dependent lower lobe consolidation and focal infiltrate within the posterior basal segment of the right upper lobe most in keeping with changes of aspiration pneumonia. Debris seen within the central airways without evidence of central obstructing lesion. Moderate coronary artery calcification. Progressive dilation of the thoracic aorta. This would be better assessed with dedicated CT arteriography once the patient's acute issues have resolved. If confirmed, recommend annual imaging followup by CTA or MRA. This recommendation follows 2010 ACCF/AHA/AATS/ACR/ASA/SCA/SCAI/SIR/STS/SVM Guidelines for the Diagnosis and Management of Patients with Thoracic Aortic Disease. Circulation. 2010; [id number]. Aortic aneurysm NOS (ICD10-I71.9) Progressive pathologic aortopulmonary window lymphadenopathy, nonspecific. This could be re-evaluated a CT imaging in 6-12 weeks once the patient's acute issues have resolved.  Alternatively, metabolic activity could be assessed with PET CT examination, again when the patient's acute issues have resolved. Aortic Atherosclerosis (ICD10-I70.0). Electronically Signed   By: Fidela Salisbury MD   On: 05/09/2020 16:35   DG Chest Port 1 View  Result Date: 05/09/2020 CLINICAL DATA:  Suspected COVID.  Altered mental status. EXAM: PORTABLE CHEST 1 VIEW COMPARISON:  June 12, 2019. FINDINGS: Bibasilar airspace opacities. No visible pleural effusions or pneumothorax. Similar cardiomediastinal silhouette with tortuous aorta. No acute osseous abnormality. IMPRESSION: Bibasilar airspace opacities, concerning for pneumonia. Electronically Signed   By: Margaretha Sheffield MD   On: 05/09/2020 12:19

## 2020-05-12 NOTE — Progress Notes (Signed)
Pharmacy Antibiotic Note  Christopher Murphy is a 75 y.o. male admitted on 05/09/2020 with aspiration PNA.  Pharmacy has been consulted for Unasyn dosing.  ID: COVID + aspiration, wbc elevated d/t steroids up to 17.9>13.7. Tm 102.1>AF, CXR concerning for PNA PCT 0.13 CRP 17.2>9.2 down  Unasyn 1/31>> Remdesivir 1/31>>2/4 Solumedrol>>Prednisone  Plan: Unasyn 3g IV every 6 hours day #4    Height: 5\' 10"  (177.8 cm) Weight: 66.2 kg (145 lb 15.1 oz) IBW/kg (Calculated) : 73  Temp (24hrs), Avg:97.6 F (36.4 C), Min:97 F (36.1 C), Max:97.9 F (36.6 C)  Recent Labs  Lab 05/09/20 1232 05/10/20 0211 05/11/20 0210 05/12/20 0104  WBC 8.9 11.4* 17.9* 13.7*  CREATININE 0.84 0.71 0.76 0.65  LATICACIDVEN 1.4  --   --   --     Estimated Creatinine Clearance: 75.9 mL/min (by C-G formula based on SCr of 0.65 mg/dL).    No Known Allergies  Shahil Speegle S. Alford Highland, PharmD, BCPS Clinical Staff Pharmacist [email or web address] [place] 05/12/2020 9:45 AM

## 2020-05-13 DIAGNOSIS — G309 Alzheimer's disease, unspecified: Secondary | ICD-10-CM

## 2020-05-13 DIAGNOSIS — F0280 Dementia in other diseases classified elsewhere without behavioral disturbance: Secondary | ICD-10-CM

## 2020-05-13 LAB — COMPREHENSIVE METABOLIC PANEL
ALT: 44 U/L (ref 0–44)
AST: 37 U/L (ref 15–41)
Albumin: 1.9 g/dL — ABNORMAL LOW (ref 3.5–5.0)
Alkaline Phosphatase: 64 U/L (ref 38–126)
Anion gap: 10 (ref 5–15)
BUN: 15 mg/dL (ref 8–23)
CO2: 23 mmol/L (ref 22–32)
Calcium: 7.9 mg/dL — ABNORMAL LOW (ref 8.9–10.3)
Chloride: 102 mmol/L (ref 98–111)
Creatinine, Ser: 0.68 mg/dL (ref 0.61–1.24)
GFR, Estimated: >60 mL/min (ref >60)
Glucose, Bld: 98 mg/dL (ref 70–99)
Potassium: 3.6 mmol/L (ref 3.5–5.1)
Sodium: 135 mmol/L (ref 135–145)
Total Bilirubin: 0.5 mg/dL (ref 0.3–1.2)
Total Protein: 5.2 g/dL — ABNORMAL LOW (ref 6.5–8.1)

## 2020-05-13 LAB — CBC WITH DIFFERENTIAL/PLATELET
Abs Immature Granulocytes: 0.2 10*3/uL — ABNORMAL HIGH (ref 0.00–0.07)
Basophils Absolute: 0 10*3/uL (ref 0.0–0.1)
Basophils Relative: 0 %
Eosinophils Absolute: 0 10*3/uL (ref 0.0–0.5)
Eosinophils Relative: 0 %
HCT: 34.2 % — ABNORMAL LOW (ref 39.0–52.0)
Hemoglobin: 11.6 g/dL — ABNORMAL LOW (ref 13.0–17.0)
Immature Granulocytes: 1 %
Lymphocytes Relative: 12 %
Lymphs Abs: 1.7 10*3/uL (ref 0.7–4.0)
MCH: 26.9 pg (ref 26.0–34.0)
MCHC: 33.9 g/dL (ref 30.0–36.0)
MCV: 79.2 fL — ABNORMAL LOW (ref 80.0–100.0)
Monocytes Absolute: 1 10*3/uL (ref 0.1–1.0)
Monocytes Relative: 7 %
Neutro Abs: 11.7 10*3/uL — ABNORMAL HIGH (ref 1.7–7.7)
Neutrophils Relative %: 80 %
Platelets: 451 10*3/uL — ABNORMAL HIGH (ref 150–400)
RBC: 4.32 MIL/uL (ref 4.22–5.81)
RDW: 13.2 % (ref 11.5–15.5)
WBC: 14.6 10*3/uL — ABNORMAL HIGH (ref 4.0–10.5)
nRBC: 0 % (ref 0.0–0.2)

## 2020-05-13 LAB — FERRITIN: Ferritin: 425 ng/mL — ABNORMAL HIGH (ref 24–336)

## 2020-05-13 LAB — C-REACTIVE PROTEIN: CRP: 5.5 mg/dL — ABNORMAL HIGH (ref <1.0)

## 2020-05-13 LAB — D-DIMER, QUANTITATIVE: D-Dimer, Quant: 0.99 ug/mL-FEU — ABNORMAL HIGH (ref 0.00–0.50)

## 2020-05-13 MED ORDER — POLYETHYLENE GLYCOL 3350 17 G PO PACK: 17.0000 g | PACK | Freq: Every day | ORAL | 0 refills | Status: DC

## 2020-05-13 MED ORDER — TAMSULOSIN HCL 0.4 MG PO CAPS: 0.4000 mg | ORAL_CAPSULE | Freq: Every day | ORAL | 0 refills | Status: DC

## 2020-05-13 MED ORDER — BENZONATATE 100 MG PO CAPS: 100.0000 mg | ORAL_CAPSULE | Freq: Three times a day (TID) | ORAL | 0 refills | Status: DC | PRN

## 2020-05-13 MED ORDER — ALBUTEROL SULFATE HFA 108 (90 BASE) MCG/ACT IN AERS: 2.0000 | INHALATION_SPRAY | Freq: Four times a day (QID) | RESPIRATORY_TRACT | 0 refills | Status: DC | PRN

## 2020-05-13 MED ORDER — AMOXICILLIN-POT CLAVULANATE 875-125 MG PO TABS: 1.0000 | ORAL_TABLET | Freq: Two times a day (BID) | ORAL | 0 refills | Status: AC

## 2020-05-13 MED ORDER — PANTOPRAZOLE SODIUM 40 MG PO TBEC: 40.0000 mg | DELAYED_RELEASE_TABLET | Freq: Every day | ORAL | 0 refills | Status: DC

## 2020-05-13 MED ORDER — PREDNISONE 10 MG PO TABS: ORAL_TABLET | ORAL | 0 refills | Status: DC

## 2020-05-13 MED ORDER — FOLIC ACID 1 MG PO TABS: 1.0000 mg | ORAL_TABLET | Freq: Every day | ORAL | 0 refills | Status: DC

## 2020-05-13 NOTE — Progress Notes (Signed)
Patient lying in bed, alert. Patient given medications and removed foley. Educated patient with interpreter from [place]. Patient placed in chair, no questions at this time.

## 2020-05-13 NOTE — Progress Notes (Signed)
Patient discharging home. Vital signs stable at time of discharge as reflected in discharge summary. Discharge instructions given to daughter and verbal understanding returned. Patient to follow up with PCP with a week.

## 2020-05-13 NOTE — Progress Notes (Signed)
Physical Therapy Treatment Patient Details Name: Christopher Murphy MRN: [id number] DOB: May 19, 1945 Today's Date: 05/13/2020    History of Present Illness Pt is 75 y.o. male with medical history significant of remote CVA with right-sided weakness, advanced dementia, GERD, presented with fever cough and increasing shortness of breath.  Pt admitted with COVID 19 PNE, he is vaccinated and boosted.    PT Comments    Pt required min assist bed mobility, transfers, and HHA ambulation bed to recliner. VSS on RA. Pt appears disinterested in participation with therapy, stating "leave me alone." Pt in recliner with feet elevated at end of session.    Follow Up Recommendations  Home health PT;Supervision/Assistance - 24 hour     Equipment Recommendations  Rolling walker with 5" wheels    Recommendations for Other Services       Precautions / Restrictions Precautions Precautions: Fall    Mobility  Bed Mobility Overal bed mobility: Needs Assistance Bed Mobility: Supine to Sit     Supine to sit: Min assist;HOB elevated     General bed mobility comments: +rail, increased time  Transfers Overall transfer level: Needs assistance Equipment used: 1 person hand held assist Transfers: Sit to/from [facility] Sit to Stand: Min assist Stand pivot transfers: Min assist       General transfer comment: assist to power up and stabilize balance. Pt able to take several steps bed to recliner.  Ambulation/Gait             General Gait Details: Pt declining.   Stairs             Wheelchair Mobility    Modified Rankin (Stroke Patients Only)       Balance Overall balance assessment: Needs assistance Sitting-balance support: No upper extremity supported;Feet supported Sitting balance-Leahy Scale: Fair     Standing balance support: Bilateral upper extremity supported;During functional activity Standing balance-Leahy Scale: Poor Standing balance  comment: reliant on external support                            Cognition Arousal/Alertness: Awake/alert Behavior During Therapy: Flat affect Overall Cognitive Status: No family/caregiver present to determine baseline cognitive functioning                                        Exercises      General Comments General comments (skin integrity, edema, etc.): SpO2 93% on RA. Resting HR 82. Max HR 121.      Pertinent Vitals/Pain Pain Assessment: No/denies pain    Home Living                      Prior Function            PT Goals (current goals can now be found in the care plan section) Acute Rehab PT Goals Patient Stated Goal: not stated Progress towards PT goals: Progressing toward goals    Frequency           PT Plan Current plan remains appropriate    Co-evaluation              AM-PAC PT "6 Clicks" Mobility   Outcome Measure  Help needed turning from your back to your side while in a flat bed without using bedrails?: A Little Help needed moving from lying on your back to sitting  on the side of a flat bed without using bedrails?: A Little Help needed moving to and from a bed to a chair (including a wheelchair)?: A Little Help needed standing up from a chair using your arms (e.g., wheelchair or bedside chair)?: A Little Help needed to walk in hospital room?: A Little Help needed climbing 3-5 steps with a railing? : A Lot 6 Click Score: 17    End of Session Equipment Utilized During Treatment: Gait belt Activity Tolerance: Patient tolerated treatment well Patient left: in chair;with call bell/phone within reach Nurse Communication: Mobility status PT Visit Diagnosis: Unsteadiness on feet (R26.81);Muscle weakness (generalized) (M62.81)     Time: 4970-2637 PT Time Calculation (min) (ACUTE ONLY): 12 min  Charges:  $Therapeutic Activity: 8-22 mins                     Lorrin Goodell, PT  Office # [phone] Pager  [phone]    Lorriane Shire 05/13/2020, 10:31 AM

## 2020-05-13 NOTE — TOC Progression Note (Signed)
Transition of Care [facility]) - Progression Note    Patient Details  Name: Ethanael Veith MRN: [id number] Date of Birth: [birth year]  [facility] CM/SW Contact  Zenon Mayo, RN Phone Number: 05/13/2020, 3:16 PM  Clinical Narrative:    NCM spoke with patient daugther n law Theresia Lo, offered choice, she states he had [facility] last year and would like to try them again, but they are unable to take referral she does not have a preference.  NCM made referral to [facility] with Alvis Lemmings, Awaiting call back.  NCM made referral to [facility] with [employer] for rolling walker,which will be brought to patient's room prior to dc.         Expected Discharge Plan and Services           Expected Discharge Date: 05/13/20                                     Social Determinants of Health (SDOH) Interventions    Readmission Risk Interventions No flowsheet data found.

## 2020-05-13 NOTE — Discharge Instructions (Signed)
Person Under Monitoring Name: Christopher Murphy  Location: [address] [place] [address]   Infection Prevention Recommendations for Individuals Confirmed to have, or Being Evaluated for, 2019 Novel Coronavirus (COVID-19) Infection Who Receive Care at Home  Individuals who are confirmed to have, or are being evaluated for, COVID-19 should follow the prevention steps below until a healthcare provider or local or state health department says they can return to normal activities.  Stay home except to get medical care You should restrict activities outside your home, except for getting medical care. Do not go to work, school, or public areas, and do not use public transportation or taxis.  Call ahead before visiting your doctor Before your medical appointment, call the healthcare provider and tell them that you have, or are being evaluated for, COVID-19 infection. This will help the healthcare provider's office take steps to keep other people from getting infected. Ask your healthcare provider to call the local or state health department.  Monitor your symptoms Seek prompt medical attention if your illness is worsening (e.g., difficulty breathing). Before going to your medical appointment, call the healthcare provider and tell them that you have, or are being evaluated for, COVID-19 infection. Ask your healthcare provider to call the local or state health department.  Wear a facemask You should wear a facemask that covers your nose and mouth when you are in the same room with other people and when you visit a healthcare provider. People who live with or visit you should also wear a facemask while they are in the same room with you.  Separate yourself from other people in your home As much as possible, you should stay in a different room from other people in your home. Also, you should use a separate bathroom, if available.  Avoid sharing household items You should  not share dishes, drinking glasses, cups, eating utensils, towels, bedding, or other items with other people in your home. After using these items, you should wash them thoroughly with soap and water.  Cover your coughs and sneezes Cover your mouth and nose with a tissue when you cough or sneeze, or you can cough or sneeze into your sleeve. Throw used tissues in a lined trash can, and immediately wash your hands with soap and water for at least 20 seconds or use an alcohol-based hand rub.  Wash your [facility] your hands often and thoroughly with soap and water for at least 20 seconds. You can use an alcohol-based hand sanitizer if soap and water are not available and if your hands are not visibly dirty. Avoid touching your eyes, nose, and mouth with unwashed hands.   Prevention Steps for Caregivers and Household Members of Individuals Confirmed to have, or Being Evaluated for, COVID-19 Infection Being Cared for in the Home  If you live with, or provide care at home for, a person confirmed to have, or being evaluated for, COVID-19 infection please follow these guidelines to prevent infection:  Follow healthcare provider's instructions Make sure that you understand and can help the patient follow any healthcare provider instructions for all care.  Provide for the patient's basic needs You should help the patient with basic needs in the home and provide support for getting groceries, prescriptions, and other personal needs.  Monitor the patient's symptoms If they are getting sicker, call his or her medical provider and tell them that the patient has, or is being evaluated for, COVID-19 infection. This will help the healthcare provider's  office take steps to keep other people from getting infected. Ask the healthcare provider to call the local or state health department.  Limit the number of people who have contact with the patient  If possible, have only one caregiver for the  patient.  Other household members should stay in another home or place of residence. If this is not possible, they should stay  in another room, or be separated from the patient as much as possible. Use a separate bathroom, if available.  Restrict visitors who do not have an essential need to be in the home.  Keep older adults, very young children, and other sick people away from the patient Keep older adults, very young children, and those who have compromised immune systems or chronic health conditions away from the patient. This includes people with chronic heart, lung, or kidney conditions, diabetes, and cancer.  Ensure good ventilation Make sure that shared spaces in the home have good air flow, such as from an air conditioner or an opened window, weather permitting.  Wash your hands often  Wash your hands often and thoroughly with soap and water for at least 20 seconds. You can use an alcohol based hand sanitizer if soap and water are not available and if your hands are not visibly dirty.  Avoid touching your eyes, nose, and mouth with unwashed hands.  Use disposable paper towels to dry your hands. If not available, use dedicated cloth towels and replace them when they become wet.  Wear a facemask and gloves  Wear a disposable facemask at all times in the room and gloves when you touch or have contact with the patient's blood, body fluids, and/or secretions or excretions, such as sweat, saliva, sputum, nasal mucus, vomit, urine, or feces.  Ensure the mask fits over your nose and mouth tightly, and do not touch it during use.  Throw out disposable facemasks and gloves after using them. Do not reuse.  Wash your hands immediately after removing your facemask and gloves.  If your personal clothing becomes contaminated, carefully remove clothing and launder. Wash your hands after handling contaminated clothing.  Place all used disposable facemasks, gloves, and other waste in a lined  container before disposing them with other household waste.  Remove gloves and wash your hands immediately after handling these items.  Do not share dishes, glasses, or other household items with the patient  Avoid sharing household items. You should not share dishes, drinking glasses, cups, eating utensils, towels, bedding, or other items with a patient who is confirmed to have, or being evaluated for, COVID-19 infection.  After the person uses these items, you should wash them thoroughly with soap and water.  Wash laundry thoroughly  Immediately remove and wash clothes or bedding that have blood, body fluids, and/or secretions or excretions, such as sweat, saliva, sputum, nasal mucus, vomit, urine, or feces, on them.  Wear gloves when handling laundry from the patient.  Read and follow directions on labels of laundry or clothing items and detergent. In general, wash and dry with the warmest temperatures recommended on the label.  Clean all areas the individual has used often  Clean all touchable surfaces, such as counters, tabletops, doorknobs, bathroom fixtures, toilets, phones, keyboards, tablets, and bedside tables, every day. Also, clean any surfaces that may have blood, body fluids, and/or secretions or excretions on them.  Wear gloves when cleaning surfaces the patient has come in contact with.  Use a diluted bleach solution (e.g., dilute bleach with 1  part bleach and 10 parts water) or a household disinfectant with a label that says EPA-registered for coronaviruses. To make a bleach solution at home, add 1 tablespoon of bleach to 1 quart (4 cups) of water. For a larger supply, add  cup of bleach to 1 gallon (16 cups) of water.  Read labels of cleaning products and follow recommendations provided on product labels. Labels contain instructions for safe and effective use of the cleaning product including precautions you should take when applying the product, such as wearing gloves or  eye protection and making sure you have good ventilation during use of the product.  Remove gloves and wash hands immediately after cleaning.  Monitor yourself for signs and symptoms of illness Caregivers and household members are considered close contacts, should monitor their health, and will be asked to limit movement outside of the home to the extent possible. Follow the monitoring steps for close contacts listed on the symptom monitoring form.   ? If you have additional questions, contact your local health department or call the epidemiologist on call at [phone] (available 24/7). ? This guidance is subject to change. For the most up-to-date guidance from [facility], please refer to their website: [email or web address]

## 2020-05-13 NOTE — Discharge Summary (Signed)
PATIENT DETAILS Name: Christopher Murphy Age: 75 y.o. Sex: male Date of Birth: Jul 03, 1945 MRN: [id number]. Admitting Physician: Lequita Halt, MD OI:[id number], Coralie Keens, NP  Admit Date: 05/09/2020 Discharge date: 05/13/2020  Recommendations for Outpatient Follow-up:  1. Follow up with PCP in 1-2 weeks 2. Please obtain CMP/CBC in one week 3. Repeat Chest Xray in 4-6 week 4. Outpatient follow-up with urology 5. Incidental finding-thoracic aortic aneurysm-needs outpatient surveillance by PCP 6. Incidental finding-mediastinal lymphadenopathy-needs repeat CT chest in 6-12 weeks  Admitted From:  Home  Disposition: Home with home health [place]:  Yes  Equipment/Devices: None  Discharge Condition: Stable  CODE STATUS: FULL CODE  Diet recommendation:  Diet Order            Diet - low sodium heart healthy           Diet vegetarian Room service appropriate? No; Fluid consistency: Thin  Diet effective now                  Brief Narrative   75 y.o.malewith medical history significant ofremote CVA with right-sided weakness, advanced dementia, GERD, presented with fever cough and increasing shortness of breath, weakness/malaise-found to have COVID-19 pneumonia and admitted for further treatment and evaluation.  Significant studies: 1/31>> CT head: No acute intracranial abnormality 1/31>> CT chest: Bilateral lower lobe consolidation-debris in central airway  Microbiology: 1/31>> blood culture: No growth 2/1>> urine culture no growth  COVID-19 medications: Remdesivir: 1/31>>2/4 Steroids: 1/31>>  Antibiotics: Unasyn: 1/31>>2/4  Brief Hospital Course: Acute hypoxemic respiratory failure due to COVID-19 pneumonia/aspiration pneumonia:  Significantly better-still with some coughing spells but not hypoxemic-inflammatory markers have down trended.  Cultures negative.  Treated with a combination of steroid/Remdesivir and Unasyn.  Evaluated by SLP as  well.  Will complete Remdesivir today-following which she will be discharged on tapering steroids and Augmentin x2 more days.  Follow with PCP in the next 1-2 weeks.    COVID-19 Labs:  Recent Labs    05/11/20 0210 05/12/20 0104 05/13/20 0135  DDIMER 0.62* 0.58* 0.99*  FERRITIN 428* 410* 425*  CRP 17.2* 9.2* 5.5*    Lab Results  Component Value Date   SARSCOV2NAA NEGATIVE 10/28/2019   [place] NEGATIVE 04/27/2019   [place] Not Detected 02/16/2019     Acute urinary retention: Foley catheter placed on 2/1-continue Flomax (noncompliant at home per daughter)-Foley discontinued-voiding trial successful today.  Needs outpatient follow-up with urology-defer to PCP.   HLD: Continue statin  Dementia: Pleasantly confused-continue Aricept/Namenda  History of CVA: Nonfocal exam-continue aspirin/statin   GERD: Continue PPI  Thoracic aortic aneurysm: Incidentally seen on noncontrast CT chest-needs outpatient evaluation with CTA/MRA.  Mediastinal lymphadenopathy: Incidentally seen on CT chest-likely reactive-needs repeat imaging in 6 to 12 weeks.  Discharge Diagnoses:  Active Problems:   COVID-19 virus infection   COVID-19   Discharge Instructions:    Person Under Monitoring Name: Christopher Murphy  Location: [address] [place] [address]   Infection Prevention Recommendations for Individuals Confirmed to have, or Being Evaluated for, 2019 Novel Coronavirus (COVID-19) Infection Who Receive Care at Home  Individuals who are confirmed to have, or are being evaluated for, COVID-19 should follow the prevention steps below until a healthcare provider or local or state health department says they can return to normal activities.  Stay home except to get medical care You should restrict activities outside your home, except for getting medical care. Do not go to work, school, or public areas,  and do not use public transportation or taxis.  Call ahead  before visiting your doctor Before your medical appointment, call the healthcare provider and tell them that you have, or are being evaluated for, COVID-19 infection. This will help the healthcare provider's office take steps to keep other people from getting infected. Ask your healthcare provider to call the local or state health department.  Monitor your symptoms Seek prompt medical attention if your illness is worsening (e.g., difficulty breathing). Before going to your medical appointment, call the healthcare provider and tell them that you have, or are being evaluated for, COVID-19 infection. Ask your healthcare provider to call the local or state health department.  Wear a facemask You should wear a facemask that covers your nose and mouth when you are in the same room with other people and when you visit a healthcare provider. People who live with or visit you should also wear a facemask while they are in the same room with you.  Separate yourself from other people in your home As much as possible, you should stay in a different room from other people in your home. Also, you should use a separate bathroom, if available.  Avoid sharing household items You should not share dishes, drinking glasses, cups, eating utensils, towels, bedding, or other items with other people in your home. After using these items, you should wash them thoroughly with soap and water.  Cover your coughs and sneezes Cover your mouth and nose with a tissue when you cough or sneeze, or you can cough or sneeze into your sleeve. Throw used tissues in a lined trash can, and immediately wash your hands with soap and water for at least 20 seconds or use an alcohol-based hand rub.  Wash your [facility] your hands often and thoroughly with soap and water for at least 20 seconds. You can use an alcohol-based hand sanitizer if soap and water are not available and if your hands are not visibly dirty. Avoid touching  your eyes, nose, and mouth with unwashed hands.   Prevention Steps for Caregivers and Household Members of Individuals Confirmed to have, or Being Evaluated for, COVID-19 Infection Being Cared for in the Home  If you live with, or provide care at home for, a person confirmed to have, or being evaluated for, COVID-19 infection please follow these guidelines to prevent infection:  Follow healthcare provider's instructions Make sure that you understand and can help the patient follow any healthcare provider instructions for all care.  Provide for the patient's basic needs You should help the patient with basic needs in the home and provide support for getting groceries, prescriptions, and other personal needs.  Monitor the patient's symptoms If they are getting sicker, call his or her medical provider and tell them that the patient has, or is being evaluated for, COVID-19 infection. This will help the healthcare provider's office take steps to keep other people from getting infected. Ask the healthcare provider to call the local or state health department.  Limit the number of people who have contact with the patient  If possible, have only one caregiver for the patient.  Other household members should stay in another home or place of residence. If this is not possible, they should stay  in another room, or be separated from the patient as much as possible. Use a separate bathroom, if available.  Restrict visitors who do not have an essential need to be in the home.  Keep older adults, very young  children, and other sick people away from the patient Keep older adults, very young children, and those who have compromised immune systems or chronic health conditions away from the patient. This includes people with chronic heart, lung, or kidney conditions, diabetes, and cancer.  Ensure good ventilation Make sure that shared spaces in the home have good air flow, such as from an air  conditioner or an opened window, weather permitting.  Wash your hands often  Wash your hands often and thoroughly with soap and water for at least 20 seconds. You can use an alcohol based hand sanitizer if soap and water are not available and if your hands are not visibly dirty.  Avoid touching your eyes, nose, and mouth with unwashed hands.  Use disposable paper towels to dry your hands. If not available, use dedicated cloth towels and replace them when they become wet.  Wear a facemask and gloves  Wear a disposable facemask at all times in the room and gloves when you touch or have contact with the patient's blood, body fluids, and/or secretions or excretions, such as sweat, saliva, sputum, nasal mucus, vomit, urine, or feces.  Ensure the mask fits over your nose and mouth tightly, and do not touch it during use.  Throw out disposable facemasks and gloves after using them. Do not reuse.  Wash your hands immediately after removing your facemask and gloves.  If your personal clothing becomes contaminated, carefully remove clothing and launder. Wash your hands after handling contaminated clothing.  Place all used disposable facemasks, gloves, and other waste in a lined container before disposing them with other household waste.  Remove gloves and wash your hands immediately after handling these items.  Do not share dishes, glasses, or other household items with the patient  Avoid sharing household items. You should not share dishes, drinking glasses, cups, eating utensils, towels, bedding, or other items with a patient who is confirmed to have, or being evaluated for, COVID-19 infection.  After the person uses these items, you should wash them thoroughly with soap and water.  Wash laundry thoroughly  Immediately remove and wash clothes or bedding that have blood, body fluids, and/or secretions or excretions, such as sweat, saliva, sputum, nasal mucus, vomit, urine, or feces, on  them.  Wear gloves when handling laundry from the patient.  Read and follow directions on labels of laundry or clothing items and detergent. In general, wash and dry with the warmest temperatures recommended on the label.  Clean all areas the individual has used often  Clean all touchable surfaces, such as counters, tabletops, doorknobs, bathroom fixtures, toilets, phones, keyboards, tablets, and bedside tables, every day. Also, clean any surfaces that may have blood, body fluids, and/or secretions or excretions on them.  Wear gloves when cleaning surfaces the patient has come in contact with.  Use a diluted bleach solution (e.g., dilute bleach with 1 part bleach and 10 parts water) or a household disinfectant with a label that says EPA-registered for coronaviruses. To make a bleach solution at home, add 1 tablespoon of bleach to 1 quart (4 cups) of water. For a larger supply, add  cup of bleach to 1 gallon (16 cups) of water.  Read labels of cleaning products and follow recommendations provided on product labels. Labels contain instructions for safe and effective use of the cleaning product including precautions you should take when applying the product, such as wearing gloves or eye protection and making sure you have good ventilation during use of the product.  Remove gloves and wash hands immediately after cleaning.  Monitor yourself for signs and symptoms of illness Caregivers and household members are considered close contacts, should monitor their health, and will be asked to limit movement outside of the home to the extent possible. Follow the monitoring steps for close contacts listed on the symptom monitoring form.   ? If you have additional questions, contact your local health department or call the epidemiologist on call at [phone] (available 24/7). ? This guidance is subject to change. For the most up-to-date guidance from CDC, please refer to their  website: YouBlogs.pl    Activity:  As tolerated with Full fall precautions use walker/cane & assistance as needed   Discharge Instructions    Call MD for:  difficulty breathing, headache or visual disturbances   Complete by: As directed    Diet - low sodium heart healthy   Complete by: As directed    Discharge instructions   Complete by: As directed    1.)  21 days of isolation from 05/09/2020  2.)  If develop worsening shortness of breath-please seek immediate medical attention  3.)  Incidental finding on CT chest-thoracic aneurysm-needs outpatient follow-up by PCP.  4.)  Incidental finding on CT chest-mediastinal lymphadenopathy-needs repeat CT chest in 6 to 12 weeks by PCP   Follow with Primary MD  [name], NP in 1-2 weeks  Please get a complete blood count and chemistry panel checked by your Primary MD at your next visit, and again as instructed by your Primary MD.  Get Medicines reviewed and adjusted: Please take all your medications with you for your next visit with your Primary MD  Laboratory/radiological data: Please request your Primary MD to go over all hospital tests and procedure/radiological results at the follow up, please ask your Primary MD to get all Hospital records sent to his/her office.  In some cases, they will be blood work, cultures and biopsy results pending at the time of your discharge. Please request that your primary care M.D. follows up on these results.  Also Note the following: If you experience worsening of your admission symptoms, develop shortness of breath, life threatening emergency, suicidal or homicidal thoughts you must seek medical attention immediately by calling 911 or calling your MD immediately  if symptoms less severe.  You must read complete instructions/literature along with all the possible adverse reactions/side effects for all the Medicines you take and that  have been prescribed to you. Take any new Medicines after you have completely understood and accpet all the possible adverse reactions/side effects.   Do not drive when taking Pain medications or sleeping medications (Benzodaizepines)  Do not take more than prescribed Pain, Sleep and Anxiety Medications. It is not advisable to combine anxiety,sleep and pain medications without talking with your primary care practitioner  Special Instructions: If you have smoked or chewed Tobacco  in the last 2 yrs please stop smoking, stop any regular Alcohol  and or any Recreational drug use.  Wear Seat belts while driving.  Please note: You were cared for by a hospitalist during your hospital stay. Once you are discharged, your primary care physician will handle any further medical issues. Please note that NO REFILLS for any discharge medications will be authorized once you are discharged, as it is imperative that you return to your primary care physician (or establish a relationship with a primary care physician if you do not have one) for your post hospital discharge needs so that they can reassess  your need for medications and monitor your lab values.   Increase activity slowly   Complete by: As directed      Allergies as of 05/13/2020   No Known Allergies     Medication List    STOP taking these medications   azithromycin 250 MG tablet Commonly known as: Zithromax Z-Pak     TAKE these medications   albuterol 108 (90 Base) MCG/ACT inhaler Commonly known as: VENTOLIN HFA Inhale 2 puffs into the lungs every 6 (six) hours as needed for wheezing or shortness of breath. What changed:   how much to take  how to take this  when to take this  reasons to take this  additional instructions   amoxicillin-clavulanate 875-125 MG tablet Commonly known as: Augmentin Take 1 tablet by mouth 2 (two) times daily for 2 days.   aspirin EC 81 MG tablet Take 81 mg by mouth in the morning.   benzonatate  100 MG capsule Commonly known as: Tessalon Perles Take 1 capsule (100 mg total) by mouth 3 (three) times daily as needed for cough.   cetirizine 10 MG tablet Commonly known as: ZYRTEC Take 1 tablet (10 mg total) by mouth daily. What changed:   when to take this  reasons to take this   donepezil 10 MG tablet Commonly known as: ARICEPT Take 1 tablet daily What changed:   how much to take  how to take this  when to take this  additional instructions   fluticasone 50 MCG/ACT nasal spray Commonly known as: FLONASE Place 1-2 sprays into both nostrils daily for 7 days.   folic acid 1 MG tablet Commonly known as: FOLVITE Take 1 tablet (1 mg total) by mouth daily.   memantine 10 MG tablet Commonly known as: NAMENDA TAKE 1 TABLET BY MOUTH TWICE A DAY   pantoprazole 40 MG tablet Commonly known as: PROTONIX Take 1 tablet (40 mg total) by mouth daily. Take 30-60 min before first meal of the day   Polyethyl Glycol-Propyl Glycol 0.4-0.3 % Soln Commonly known as: Systane Apply 1 drop to eye 2 (two) times daily. What changed: how to take this   polyethylene glycol 17 g packet Commonly known as: MIRALAX / GLYCOLAX Take 17 g by mouth daily. Start taking on: May 14, 2020   predniSONE 10 MG tablet Commonly known as: DELTASONE Take 40 mg daily for 1 day, 30 mg daily for 1 day, 20 mg daily for 1 days,10 mg daily for 1 day, then stop   rosuvastatin 20 MG tablet Commonly known as: CRESTOR Take 1 tablet (20 mg total) by mouth daily. What changed: when to take this   tamsulosin 0.4 MG Caps capsule Commonly known as: FLOMAX Take 1 capsule (0.4 mg total) by mouth daily after breakfast. Start taking on: May 14, 2020   triamcinolone 0.1 % Commonly known as: KENALOG APPLY TO AFFECTED AREA TWICE A DAY What changed: See the new instructions.       Follow-up Information    Lorre Munroe, NP. Schedule an appointment as soon as possible for a visit in 1 week(s).    Specialties: Internal Medicine, Emergency Medicine Contact information: [address] [place] [address] [phone]              No Known Allergies    Consultations:   None    Other Procedures/Studies: CT Head Wo Contrast  Result Date: 05/09/2020 CLINICAL DATA:  Altered mental status. EXAM: CT HEAD WITHOUT CONTRAST TECHNIQUE: Contiguous  axial images were obtained from the base of the skull through the vertex without intravenous contrast. COMPARISON:  10/28/2019 head CT and brain MR. FINDINGS: Brain: Advanced for age cerebral atrophy. Severe low density in the periventricular white matter likely related to small vessel disease. Remote left PCA distribution infarct. No mass lesion, hemorrhage, hydrocephalus, acute infarct, intra-axial, or extra-axial fluid collection. Vascular: No hyperdense vessel or unexpected calcification. Intracranial atherosclerosis. Skull: No significant soft tissue swelling.  No skull fracture. Sinuses/Orbits: Normal imaged portions of the orbits and globes. Mucosal thickening ethmoid air cells. Clear mastoid air cells. Other: None. IMPRESSION: 1. No acute intracranial abnormality. 2. Cerebral atrophy and small vessel ischemic change. 3. Remote left PCA distribution infarct. 4. Sinus disease. Electronically Signed   By: Abigail Miyamoto M.D.   On: 05/09/2020 13:18   CT CHEST WO CONTRAST  Result Date: 05/09/2020 CLINICAL DATA:  Aspiration, COVID pneumonia EXAM: CT CHEST WITHOUT CONTRAST TECHNIQUE: Multidetector CT imaging of the chest was performed following the standard protocol without IV contrast. COMPARISON:  10/29/2019 FINDINGS: Cardiovascular: Moderate coronary artery calcification. Global cardiac size within normal limits. Small pericardial effusion is present, stable since prior examination. Central pulmonary arteries are of normal caliber. The thoracic aorta is mildly dilated measuring 4.0 cm in its ascending segment and 3.5 cm in its proximal  descending segment. The aorta measures 2.8 cm in diameter at the level of the left atrium. This appears slightly progressive since prior examination where this measured 3.6 cm and 3.0 cm, respectively. Mediastinum/Nodes: Pathologic aortopulmonary window lymphadenopathy is again identified and appears progressive measuring 2.1 x 3.4 cm at axial image # 53/3. Several shotty precarinal lymph nodes are identified. No additional pathologic thoracic adenopathy is seen. Thyroid unremarkable. Esophagus unremarkable. Lungs/Pleura: There is bilateral lower lobe depending consolidation as well as airspace infiltrate within the posterior segment of the right upper lobe. Additionally, there is debris seen within the right mainstem bronchus. Together, the findings are suspicious for changes of aspiration pneumonia. There is associated bronchial wall thickening in keeping with airway inflammation. No pneumothorax or pleural effusion. Upper Abdomen: Unremarkable Musculoskeletal: No acute bone abnormality. IMPRESSION: Interval development of bilateral dependent lower lobe consolidation and focal infiltrate within the posterior basal segment of the right upper lobe most in keeping with changes of aspiration pneumonia. Debris seen within the central airways without evidence of central obstructing lesion. Moderate coronary artery calcification. Progressive dilation of the thoracic aorta. This would be better assessed with dedicated CT arteriography once the patient's acute issues have resolved. If confirmed, recommend annual imaging followup by CTA or MRA. This recommendation follows 2010 ACCF/AHA/AATS/ACR/ASA/SCA/SCAI/SIR/STS/SVM Guidelines for the Diagnosis and Management of Patients with Thoracic Aortic Disease. Circulation. 2010; [id number]. Aortic aneurysm NOS (ICD10-I71.9) Progressive pathologic aortopulmonary window lymphadenopathy, nonspecific. This could be re-evaluated a CT imaging in 6-12 weeks once the patient's acute  issues have resolved. Alternatively, metabolic activity could be assessed with PET CT examination, again when the patient's acute issues have resolved. Aortic Atherosclerosis (ICD10-I70.0). Electronically Signed   By: Fidela Salisbury MD   On: 05/09/2020 16:35   DG Chest Port 1 View  Result Date: 05/09/2020 CLINICAL DATA:  Suspected COVID.  Altered mental status. EXAM: PORTABLE CHEST 1 VIEW COMPARISON:  June 12, 2019. FINDINGS: Bibasilar airspace opacities. No visible pleural effusions or pneumothorax. Similar cardiomediastinal silhouette with tortuous aorta. No acute osseous abnormality. IMPRESSION: Bibasilar airspace opacities, concerning for pneumonia. Electronically Signed   By: Margaretha Sheffield MD   On: 05/09/2020 12:19  TODAY-DAY OF DISCHARGE:  Subjective:   Detravious Kirchen today has no headache,no chest abdominal pain,no new weakness tingling or numbness, feels much better wants to go home today.   Objective:   Blood pressure (!) 160/84, pulse 66, temperature 97.6 F (36.4 C), temperature source Axillary, resp. rate 16, height 5\' 10"  (1.778 m), weight 66.2 kg, SpO2 93 %.  Intake/Output Summary (Last 24 hours) at 05/13/2020 1410 Last data filed at 05/13/2020 0439 Gross per 24 hour  Intake 100 ml  Output 1825 ml  Net -1725 ml   Filed Weights   05/09/20 1140  Weight: 66.2 kg    Exam: Awake Alert, Oriented *3, No new F.N deficits, Normal affect [place].AT,PERRAL Supple Neck,No JVD, No cervical lymphadenopathy appriciated.  Symmetrical Chest wall movement, Good air movement bilaterally, CTAB RRR,No Gallops,Rubs or new Murmurs, No Parasternal Heave +ve B.Sounds, Abd Soft, Non tender, No organomegaly appriciated, No rebound -guarding or rigidity. No Cyanosis, Clubbing or edema, No new Rash or bruise   PERTINENT RADIOLOGIC STUDIES: CT Head Wo Contrast  Result Date: 05/09/2020 CLINICAL DATA:  Altered mental status. EXAM: CT HEAD WITHOUT CONTRAST TECHNIQUE: Contiguous axial images  were obtained from the base of the skull through the vertex without intravenous contrast. COMPARISON:  10/28/2019 head CT and brain MR. FINDINGS: Brain: Advanced for age cerebral atrophy. Severe low density in the periventricular white matter likely related to small vessel disease. Remote left PCA distribution infarct. No mass lesion, hemorrhage, hydrocephalus, acute infarct, intra-axial, or extra-axial fluid collection. Vascular: No hyperdense vessel or unexpected calcification. Intracranial atherosclerosis. Skull: No significant soft tissue swelling.  No skull fracture. Sinuses/Orbits: Normal imaged portions of the orbits and globes. Mucosal thickening ethmoid air cells. Clear mastoid air cells. Other: None. IMPRESSION: 1. No acute intracranial abnormality. 2. Cerebral atrophy and small vessel ischemic change. 3. Remote left PCA distribution infarct. 4. Sinus disease. Electronically Signed   By: Abigail Miyamoto M.D.   On: 05/09/2020 13:18   CT CHEST WO CONTRAST  Result Date: 05/09/2020 CLINICAL DATA:  Aspiration, COVID pneumonia EXAM: CT CHEST WITHOUT CONTRAST TECHNIQUE: Multidetector CT imaging of the chest was performed following the standard protocol without IV contrast. COMPARISON:  10/29/2019 FINDINGS: Cardiovascular: Moderate coronary artery calcification. Global cardiac size within normal limits. Small pericardial effusion is present, stable since prior examination. Central pulmonary arteries are of normal caliber. The thoracic aorta is mildly dilated measuring 4.0 cm in its ascending segment and 3.5 cm in its proximal descending segment. The aorta measures 2.8 cm in diameter at the level of the left atrium. This appears slightly progressive since prior examination where this measured 3.6 cm and 3.0 cm, respectively. Mediastinum/Nodes: Pathologic aortopulmonary window lymphadenopathy is again identified and appears progressive measuring 2.1 x 3.4 cm at axial image # 53/3. Several shotty precarinal lymph  nodes are identified. No additional pathologic thoracic adenopathy is seen. Thyroid unremarkable. Esophagus unremarkable. Lungs/Pleura: There is bilateral lower lobe depending consolidation as well as airspace infiltrate within the posterior segment of the right upper lobe. Additionally, there is debris seen within the right mainstem bronchus. Together, the findings are suspicious for changes of aspiration pneumonia. There is associated bronchial wall thickening in keeping with airway inflammation. No pneumothorax or pleural effusion. Upper Abdomen: Unremarkable Musculoskeletal: No acute bone abnormality. IMPRESSION: Interval development of bilateral dependent lower lobe consolidation and focal infiltrate within the posterior basal segment of the right upper lobe most in keeping with changes of aspiration pneumonia. Debris seen within the central airways without evidence of central obstructing  lesion. Moderate coronary artery calcification. Progressive dilation of the thoracic aorta. This would be better assessed with dedicated CT arteriography once the patient's acute issues have resolved. If confirmed, recommend annual imaging followup by CTA or MRA. This recommendation follows 2010 ACCF/AHA/AATS/ACR/ASA/SCA/SCAI/SIR/STS/SVM Guidelines for the Diagnosis and Management of Patients with Thoracic Aortic Disease. Circulation. 2010; [id number]. Aortic aneurysm NOS (ICD10-I71.9) Progressive pathologic aortopulmonary window lymphadenopathy, nonspecific. This could be re-evaluated a CT imaging in 6-12 weeks once the patient's acute issues have resolved. Alternatively, metabolic activity could be assessed with PET CT examination, again when the patient's acute issues have resolved. Aortic Atherosclerosis (ICD10-I70.0). Electronically Signed   By: Fidela Salisbury MD   On: 05/09/2020 16:35   DG Chest Port 1 View  Result Date: 05/09/2020 CLINICAL DATA:  Suspected COVID.  Altered mental status. EXAM: PORTABLE CHEST 1  VIEW COMPARISON:  June 12, 2019. FINDINGS: Bibasilar airspace opacities. No visible pleural effusions or pneumothorax. Similar cardiomediastinal silhouette with tortuous aorta. No acute osseous abnormality. IMPRESSION: Bibasilar airspace opacities, concerning for pneumonia. Electronically Signed   By: Margaretha Sheffield MD   On: 05/09/2020 12:19     PERTINENT LAB RESULTS: CBC: Recent Labs    05/12/20 0104 05/13/20 0135  WBC 13.7* 14.6*  HGB 10.8* 11.6*  HCT 31.8* 34.2*  PLT 420* 451*   CMET CMP     Component Value Date/Time   NA 135 05/13/2020 0135   K 3.6 05/13/2020 0135   CL 102 05/13/2020 0135   CO2 23 05/13/2020 0135   GLUCOSE 98 05/13/2020 0135   BUN 15 05/13/2020 0135   CREATININE 0.68 05/13/2020 0135   CREATININE 0.85 06/12/2019 1529   CALCIUM 7.9 (L) 05/13/2020 0135   PROT 5.2 (L) 05/13/2020 0135   ALBUMIN 1.9 (L) 05/13/2020 0135   AST 37 05/13/2020 0135   ALT 44 05/13/2020 0135   ALKPHOS 64 05/13/2020 0135   BILITOT 0.5 05/13/2020 0135   GFRNONAA >60 05/13/2020 0135   GFRNONAA 83 02/21/2015 1008   GFRAA >60 10/28/2019 0905   GFRAA >89 02/21/2015 1008    GFR Estimated Creatinine Clearance: 75.9 mL/min (by C-G formula based on SCr of 0.68 mg/dL). No results for input(s): LIPASE, AMYLASE in the last 72 hours. No results for input(s): CKTOTAL, CKMB, CKMBINDEX, TROPONINI in the last 72 hours. Invalid input(s): POCBNP Recent Labs    05/12/20 0104 05/13/20 0135  DDIMER 0.58* 0.99*   No results for input(s): HGBA1C in the last 72 hours. No results for input(s): CHOL, HDL, LDLCALC, TRIG, CHOLHDL, LDLDIRECT in the last 72 hours. No results for input(s): TSH, T4TOTAL, T3FREE, THYROIDAB in the last 72 hours.  Invalid input(s): FREET3 Recent Labs    05/12/20 0104 05/13/20 0135  FERRITIN 410* 425*   Coags: No results for input(s): INR in the last 72 hours.  Invalid input(s): PT Microbiology: Recent Results (from the past 240 hour(s))  Blood Culture  (routine x 2)     Status: None (Preliminary result)   Collection Time: 05/09/20 12:31 PM   Specimen: BLOOD RIGHT ARM  Result Value Ref Range Status   Specimen Description BLOOD RIGHT ARM  Final   Special Requests   Final    BOTTLES DRAWN AEROBIC AND ANAEROBIC Blood Culture adequate volume   Culture   Final    NO GROWTH 4 DAYS Performed at [facility], [address] [place],  [address]    Report Status PENDING  Incomplete  Blood Culture (routine x 2)     Status:  None (Preliminary result)   Collection Time: 05/09/20 12:32 PM   Specimen: BLOOD LEFT ARM  Result Value Ref Range Status   Specimen Description BLOOD LEFT ARM  Final   Special Requests   Final    BOTTLES DRAWN AEROBIC AND ANAEROBIC Blood Culture adequate volume   Culture   Final    NO GROWTH 4 DAYS Performed at [facility], [address], [place], [place] [address]    Report Status PENDING  Incomplete  Urine culture     Status: None   Collection Time: 05/10/20  5:12 AM   Specimen: In/Out Cath Urine  Result Value Ref Range Status   Specimen Description IN/OUT CATH URINE  Final   Special Requests NONE  Final   Culture   Final    NO GROWTH Performed at [facility], [place] [address], [place], [place] [address]    Report Status 05/11/2020 FINAL  Final    FURTHER DISCHARGE INSTRUCTIONS:  Get Medicines reviewed and adjusted: Please take all your medications with you for your next visit with your Primary MD  Laboratory/radiological data: Please request your Primary MD to go over all hospital tests and procedure/radiological results at the follow up, please ask your Primary MD to get all Hospital records sent to his/her office.  In some cases, they will be blood work, cultures and biopsy results pending at the time of your discharge. Please request that your primary care M.D. goes through all the records of your hospital data and follows up on these results.  Also Note the following: If you  experience worsening of your admission symptoms, develop shortness of breath, life threatening emergency, suicidal or homicidal thoughts you must seek medical attention immediately by calling 911 or calling your MD immediately  if symptoms less severe.  You must read complete instructions/literature along with all the possible adverse reactions/side effects for all the Medicines you take and that have been prescribed to you. Take any new Medicines after you have completely understood and accpet all the possible adverse reactions/side effects.   Do not drive when taking Pain medications or sleeping medications (Benzodaizepines)  Do not take more than prescribed Pain, Sleep and Anxiety Medications. It is not advisable to combine anxiety,sleep and pain medications without talking with your primary care practitioner  Special Instructions: If you have smoked or chewed Tobacco  in the last 2 yrs please stop smoking, stop any regular Alcohol  and or any Recreational drug use.  Wear Seat belts while driving.  Please note: You were cared for by a hospitalist during your hospital stay. Once you are discharged, your primary care physician will handle any further medical issues. Please note that NO REFILLS for any discharge medications will be authorized once you are discharged, as it is imperative that you return to your primary care physician (or establish a relationship with a primary care physician if you do not have one) for your post hospital discharge needs so that they can reassess your need for medications and monitor your lab values.  Total Time spent coordinating discharge including counseling, education and face to face time equals 35 minutes.  Signed: Kynzli Rease 05/13/2020 2:10 PM

## 2020-05-13 NOTE — TOC Transition Note (Signed)
Transition of Care [facility]) - CM/SW Discharge Note   Patient Details  Name: Christopher Murphy MRN: [id number] Date of Birth: 26-Feb-1946  Transition of Care [facility]) CM/SW Contact:  Zenon Mayo, RN Phone Number: 05/13/2020, 4:05 PM   Clinical Narrative:    NCM spoke with patient daugther n law Christopher Murphy, offered choice, she states he had [facility] last year and would like to try them again, but they are unable to take referral she does not have a preference.  NCM made referral to [facility] with [facility], Awaiting call back.  NCM made referral to [facility] with [facility] for rolling walker,which will be brought to patient's room prior to dc.  Tommi Rumps states he can take this referral. Soc will begin 24 to 48 hrs post dc.    Final next level of care: Home w Home Health Services Barriers to Discharge: No Barriers Identified   Patient Goals and CMS Choice   CMS Medicare.gov Compare Post Acute Care list provided to:: Patient Represenative (must comment) Choice offered to / list presented to : Adult Children  Discharge Placement                       Discharge Plan and Services                DME Arranged: Walker rolling DME Agency: [facility] Date DME Agency Contacted: 05/13/20 Time DME Agency Contacted: [phone] Representative spoke with at DME Agency: [place]: PT,OT [place]: [place] Date [place]: 05/13/20 Time [place]: [place] Representative spoke with at [employer]: cory  Social Determinants of Health ([place]) Interventions     Readmission Risk Interventions No flowsheet data found.

## 2020-05-14 LAB — CULTURE, BLOOD (ROUTINE X 2)
Culture: NO GROWTH
Culture: NO GROWTH
Special Requests: ADEQUATE
Special Requests: ADEQUATE

## 2020-05-16 ENCOUNTER — Telehealth: Payer: Self-pay | Admitting: *Deleted

## 2020-05-16 NOTE — Telephone Encounter (Signed)
Transition Care Management Unsuccessful Follow-up Telephone Call  Date of discharge and from where:  05/13/2020 - [facility]   Attempts:  1st Attempt  Reason for unsuccessful TCM follow-up call:  Unable to leave a message due to interpreter services not having a [place] interpreter available at this time

## 2020-05-17 NOTE — Telephone Encounter (Signed)
Transition Care Management Unsuccessful Follow-up Telephone Call  Date of discharge and from where:  05/13/2020 [facility]  Attempts:  2nd Attempt  Reason for unsuccessful TCM follow-up call:  Unable to leave message due to interpreter services not having a [place] interpreter available at this time.

## 2020-05-18 NOTE — Telephone Encounter (Signed)
Transition Care Management Unsuccessful Follow-up Telephone Call  Date of discharge and from where:  05/13/2020 [facility]  Attempts:  3rd Attempt  Reason for unsuccessful TCM follow-up call:  Unable to leave message due to interpreter services not having a [place] interpreter available at this time.

## 2020-05-24 ENCOUNTER — Telehealth: Payer: Medicaid Other | Admitting: Internal Medicine

## 2020-05-26 ENCOUNTER — Other Ambulatory Visit: Payer: Self-pay | Admitting: Internal Medicine

## 2020-05-27 ENCOUNTER — Telehealth: Payer: Self-pay

## 2020-05-27 NOTE — Telephone Encounter (Signed)
Ok for verbal orders for PT as requested 

## 2020-05-27 NOTE — Telephone Encounter (Signed)
VO given to Amy, Amy also wanted to report that pt's BP was 100/60 and would like to know if you wanted to provide any specific guidelines for update to you in reference to BP readings... Pt does have hosp f/u with [facility] next week as well... Pt has Hx of similar low BP readings in the past  Please advise

## 2020-05-27 NOTE — Telephone Encounter (Signed)
Amy PT with [facility] called requesting VO for home PT 2x a week for 3 weeks and 1x a week for 3 weeks

## 2020-05-27 NOTE — Telephone Encounter (Signed)
Please advise if okay to refill inhaler and 81mg  Aspirin.Marland KitchenMarland Kitchen

## 2020-05-28 NOTE — Telephone Encounter (Signed)
Would hold Flomax until appt. Could have BP lowering effect.

## 2020-05-31 NOTE — Telephone Encounter (Signed)
Called Amy with [facility], no answer and VM not set up.... msg sent to pt via mychart

## 2020-06-01 NOTE — Telephone Encounter (Signed)
noted 

## 2020-06-02 ENCOUNTER — Ambulatory Visit (INDEPENDENT_AMBULATORY_CARE_PROVIDER_SITE_OTHER): Payer: Medicaid Other | Admitting: Primary Care

## 2020-06-02 ENCOUNTER — Ambulatory Visit (INDEPENDENT_AMBULATORY_CARE_PROVIDER_SITE_OTHER)
Admission: RE | Admit: 2020-06-02 | Discharge: 2020-06-02 | Disposition: A | Discharge status: Home / Self Care | Payer: Medicaid Other | Source: Ambulatory Visit | Attending: Primary Care | Admitting: Primary Care

## 2020-06-02 ENCOUNTER — Encounter: Payer: Self-pay | Admitting: Primary Care

## 2020-06-02 ENCOUNTER — Other Ambulatory Visit: Payer: Self-pay

## 2020-06-02 VITALS — BP 104/62 | HR 102 | Temp 97.0°F | Wt 136.5 lb

## 2020-06-02 DIAGNOSIS — I712 Thoracic aortic aneurysm, without rupture, unspecified: Secondary | ICD-10-CM | POA: Insufficient documentation

## 2020-06-02 DIAGNOSIS — U071 COVID-19: Secondary | ICD-10-CM | POA: Diagnosis not present

## 2020-06-02 DIAGNOSIS — R590 Localized enlarged lymph nodes: Secondary | ICD-10-CM | POA: Insufficient documentation

## 2020-06-02 LAB — CBC WITH DIFFERENTIAL/PLATELET
Basophils Absolute: 0.1 10*3/uL (ref 0.0–0.1)
Basophils Relative: 0.7 % (ref 0.0–3.0)
Eosinophils Absolute: 0.2 10*3/uL (ref 0.0–0.7)
Eosinophils Relative: 2.5 % (ref 0.0–5.0)
HCT: 35.7 % — ABNORMAL LOW (ref 39.0–52.0)
Hemoglobin: 11.7 g/dL — ABNORMAL LOW (ref 13.0–17.0)
Lymphocytes Relative: 20 % (ref 12.0–46.0)
Lymphs Abs: 1.5 10*3/uL (ref 0.7–4.0)
MCHC: 32.7 g/dL (ref 30.0–36.0)
MCV: 82.6 fl (ref 78.0–100.0)
Monocytes Absolute: 0.8 10*3/uL (ref 0.1–1.0)
Monocytes Relative: 9.9 % (ref 3.0–12.0)
Neutro Abs: 5.1 10*3/uL (ref 1.4–7.7)
Neutrophils Relative %: 66.9 % (ref 43.0–77.0)
Platelets: 308 10*3/uL (ref 150.0–400.0)
RBC: 4.32 Mil/uL (ref 4.22–5.81)
RDW: 14.8 % (ref 11.5–15.5)
WBC: 7.7 10*3/uL (ref 4.0–10.5)

## 2020-06-02 LAB — COMPREHENSIVE METABOLIC PANEL
ALT: 28 U/L (ref 0–53)
AST: 25 U/L (ref 0–37)
Albumin: 3.3 g/dL — ABNORMAL LOW (ref 3.5–5.2)
Alkaline Phosphatase: 55 U/L (ref 39–117)
BUN: 12 mg/dL (ref 6–23)
CO2: 27 mEq/L (ref 19–32)
Calcium: 8.9 mg/dL (ref 8.4–10.5)
Chloride: 99 mEq/L (ref 96–112)
Creatinine, Ser: 0.74 mg/dL (ref 0.40–1.50)
GFR: 89.25 mL/min (ref >60.00)
Glucose, Bld: 93 mg/dL (ref 70–99)
Potassium: 3.7 mEq/L (ref 3.5–5.1)
Sodium: 135 mEq/L (ref 135–145)
Total Bilirubin: 0.3 mg/dL (ref 0.2–1.2)
Total Protein: 7.3 g/dL (ref 6.0–8.3)

## 2020-06-02 NOTE — Progress Notes (Signed)
Subjective:    Patient ID: Christopher Murphy, male    DOB: [birth year], 75 y.o.   MRN: [id number]  HPI  This visit occurred during the SARS-CoV-2 public health emergency.  Safety protocols were in place, including screening questions prior to the visit, additional usage of staff PPE, and extensive cleaning of exam room while observing appropriate contact time as indicated for disinfecting solutions.   [name] is a 75 year old male patient of [facility] with a history of TIA, vascular dementia, COVID-19 infection, altered mental status who presents today for hospital follow-up. He has a family member with him who is translating as he does not speak [place].  [facility] on 05/09/2020 with a chief complaint of altered mental status and suspected COVID-19. He was lethargic and confused upon arrival. His son and daughter-in-law had to provide information for HPI via phone, but mentioned that his symptoms began 5 days prior with fevers, cough, profound weakness.  During his stay in the ED his temperature was 102, chest x-ray showed bilateral lower field infiltrates. Negative D-dimer. He was admitted for further evaluation.  During his hospital stay he was treated with remdesivir, steroids. CT chest was with bilateral lower lobe infiltrates compatible with aspiration pneumonia secondary to COVID-19, so he was initiated on Unasyn. He was noted to have urinary retention so Flomax was initiated for suspicion of BPH, Foley catheter placed  He was also noted to have thoracic aortic aneurysm, incidental finding on noncontrasted CT chest, recommendations for outpatient CTA/MRA. Also noted to have mediastinal lymphadenopathy, recommended repeat CT imaging in 6 to 12 weeks.   He was discharged home on 05/13/2020 with recommendations for PCP follow-up, CBC/CMP, repeat chest x-ray in 4 to 6 weeks, outpatient urology follow-up, thoracic aortic aneurysm follow-up, repeat CT chest in 6 to 12  weeks. He was discharged home with Augmentin and prednisone.   Since his discharge home he continues to feel tired. His BP has been running on the lower side of normal, HR on the higher side of normal. He is no longer taking Flomax and never took after discharge as he did not wish to take. He denies urinary symptoms. He is eating some, his daughter in law endorses he is drinking water. He lives with his son and daughter in [place].   He has been visited by the home health physical therapist twice. He is following with neurology, Dr. Leonie Man.   BP Readings from Last 3 Encounters:  06/02/20 104/62  05/13/20 123/76  02/25/20 118/64   Wt Readings from Last 3 Encounters:  06/02/20 136 lb 8 oz (61.9 kg)  05/09/20 145 lb 15.1 oz (66.2 kg)  02/25/20 146 lb (66.2 kg)   Body mass index is 19.59 kg/m.   Review of Systems  Constitutional: Positive for fatigue. Negative for fever.  Respiratory: Positive for cough. Negative for shortness of breath.   Cardiovascular: Negative for chest pain.  Neurological: Negative for dizziness.       Past Medical History:  Diagnosis Date  . Blood clots in brain 2009  . Stroke [facility])      Social History   Socioeconomic History  . Marital status: Married    Spouse name: Not on file  . Number of children: Not on file  . Years of education: Not on file  . Highest education level: Not on file  Occupational History  . Not on file  Tobacco Use  . Smoking status: Never Smoker  . Smokeless  tobacco: Never Used  Vaping Use  . Vaping Use: Never used  Substance and Sexual Activity  . Alcohol use: No  . Drug use: No  . Sexual activity: Not on file  Other Topics Concern  . Not on file  Social History Narrative   Lives with wife, son and daughter in law + 2 children   Right Handed   Drinks no caffeine   Social Determinants of Health   Financial Resource Strain: Not on file  Food Insecurity: Not on file  Transportation Needs: Not on file  Physical  Activity: Not on file  Stress: Not on file  Social Connections: Not on file  Intimate Partner Violence: Not on file    Past Surgical History:  Procedure Laterality Date  . COLONOSCOPY WITH PROPOFOL N/A 04/30/2019   Procedure: COLONOSCOPY WITH PROPOFOL;  Surgeon: Jonathon Bellows, MD;  Location: [facility] ENDOSCOPY;  Service: Gastroenterology;  Laterality: N/A;  . PROSTATECTOMY  1980    Family History  Problem Relation Age of Onset  . AAA (abdominal aortic aneurysm) Neg Hx     No Known Allergies  Current Outpatient Medications on File Prior to Visit  Medication Sig Dispense Refill  . ASPIRIN LOW DOSE 81 MG EC tablet TAKE 1 TABLET BY MOUTH EVERY DAY 90 tablet 3  . benzonatate (TESSALON PERLES) 100 MG capsule Take 1 capsule (100 mg total) by mouth 3 (three) times daily as needed for cough. 30 capsule 0  . cetirizine (ZYRTEC) 10 MG tablet Take 1 tablet (10 mg total) by mouth daily. (Patient taking differently: Take 10 mg by mouth daily as needed for allergies or rhinitis.) 30 tablet 11  . donepezil (ARICEPT) 10 MG tablet Take 1 tablet daily (Patient taking differently: Take 10 mg by mouth at bedtime.) 90 tablet 1  . folic acid (FOLVITE) 1 MG tablet Take 1 tablet (1 mg total) by mouth daily. 30 tablet 0  . memantine (NAMENDA) 10 MG tablet TAKE 1 TABLET BY MOUTH TWICE A DAY (Patient taking differently: Take 10 mg by mouth 2 (two) times daily.) 180 tablet 1  . Polyethyl Glycol-Propyl Glycol (SYSTANE) 0.4-0.3 % SOLN Apply 1 drop to eye 2 (two) times daily. (Patient taking differently: Place 1 drop into both eyes 2 (two) times daily.) 30 mL 5  . polyethylene glycol (MIRALAX / GLYCOLAX) 17 g packet Take 17 g by mouth daily. 14 each 0  . predniSONE (DELTASONE) 10 MG tablet Take 40 mg daily for 1 day, 30 mg daily for 1 day, 20 mg daily for 1 days,10 mg daily for 1 day, then stop 10 tablet 0  . PROAIR HFA 108 (90 Base) MCG/ACT inhaler INHALE 1 TO 2 PUFFS INTO THE LUNGS EVERY 4 TO 6 HOURS AS NEEDED 8.5 each 1   . rosuvastatin (CRESTOR) 20 MG tablet TAKE 1 TABLET BY MOUTH EVERY DAY 90 tablet 0  . tamsulosin (FLOMAX) 0.4 MG CAPS capsule Take 1 capsule (0.4 mg total) by mouth daily after breakfast. 30 capsule 0  . triamcinolone (KENALOG) 0.1 % Apply 1 application topically 2 (two) times daily as needed (to affected areas- for itching). 30 g 0  . fluticasone (FLONASE) 50 MCG/ACT nasal spray Place 1-2 sprays into both nostrils daily for 7 days. (Patient not taking: No sig reported) 1 g 0  . pantoprazole (PROTONIX) 40 MG tablet Take 1 tablet (40 mg total) by mouth daily. Take 30-60 min before first meal of the day (Patient not taking: Reported on 06/02/2020) 30 tablet 0  No current facility-administered medications on file prior to visit.    BP 104/62   Pulse (!) 102   Temp (!) 97 F (36.1 C) (Temporal)   Wt 136 lb 8 oz (61.9 kg)   SpO2 98%   BMI 19.59 kg/m    Objective:   Physical Exam Constitutional:      Appearance: He is well-nourished.  Cardiovascular:     Rate and Rhythm: Normal rate and regular rhythm.  Pulmonary:     Effort: Pulmonary effort is normal.     Breath sounds: Normal breath sounds.  Musculoskeletal:     Cervical back: Neck supple.  Skin:    General: Skin is warm and dry.  Neurological:     Mental Status: He is alert.  Psychiatric:        Mood and Affect: Mood and affect and mood normal.     Comments: Follows commands from daughter-in-law.            Assessment & Plan:

## 2020-06-02 NOTE — Assessment & Plan Note (Signed)
With bilateral pneumonia and aspiration.  Today he appears stable, lung sounds overall unremarkable. He does appear weak and tired, likely dehydrated.  Repeat chest x-ray and labs pending per recommendations.  He will need CTA/MRA around late March 2022 for follow-up of thoracic aortic aneurysm and mediastinal lymphadenopathy.  Will notify PCP.  Hospital notes, labs, imaging reviewed.

## 2020-06-02 NOTE — Assessment & Plan Note (Signed)
Incidental finding on CT chest during hospitalization in late January 2022.  Due for repeat imaging around late March 2022, will need CTA/MRA.

## 2020-06-02 NOTE — Patient Instructions (Signed)
Complete xray(s) and labs prior to leaving today. I will notify you of your results once received.  You are due for your repeat CT scan in mid to late March 2022. I will coordinate with [facility] Silversmith.   Ensure you are consuming 64 ounces of water daily.  It was a pleasure meeting you!

## 2020-06-02 NOTE — Assessment & Plan Note (Signed)
Incidental finding on CT from hospitalization in late January 2022.  Needs repeat CT chest in late March 2022.

## 2020-06-08 ENCOUNTER — Other Ambulatory Visit: Payer: Self-pay | Admitting: Internal Medicine

## 2020-06-08 ENCOUNTER — Telehealth: Payer: Self-pay | Admitting: *Deleted

## 2020-06-08 NOTE — Telephone Encounter (Signed)
[place] for verbal orders for ST as requested.

## 2020-06-08 NOTE — Telephone Encounter (Signed)
Amy Moon PT with [facility] called requesting orders for a speech therapist evaluation. Amy stated that she would like an order to evaluate and treat if appropriate.

## 2020-06-09 NOTE — Telephone Encounter (Signed)
VO given to Amy 

## 2020-06-28 ENCOUNTER — Telehealth: Payer: Self-pay | Admitting: *Deleted

## 2020-06-28 NOTE — Telephone Encounter (Signed)
OK for [facility] PT as requested

## 2020-06-28 NOTE — Telephone Encounter (Signed)
Christopher Murphy, PT called to request verbal orders to extend his PT 1x a week for 2 weeks, to help with strengthening, balance and mobility

## 2020-06-29 ENCOUNTER — Telehealth: Payer: Self-pay | Admitting: *Deleted

## 2020-06-29 ENCOUNTER — Other Ambulatory Visit: Payer: Self-pay

## 2020-06-29 ENCOUNTER — Ambulatory Visit (INDEPENDENT_AMBULATORY_CARE_PROVIDER_SITE_OTHER): Payer: Medicaid Other | Admitting: Internal Medicine

## 2020-06-29 ENCOUNTER — Encounter: Payer: Self-pay | Admitting: Internal Medicine

## 2020-06-29 VITALS — BP 112/66 | HR 72 | Temp 97.1°F | Wt 135.0 lb

## 2020-06-29 DIAGNOSIS — Z8616 Personal history of COVID-19: Secondary | ICD-10-CM | POA: Diagnosis not present

## 2020-06-29 DIAGNOSIS — L853 Xerosis cutis: Secondary | ICD-10-CM

## 2020-06-29 DIAGNOSIS — R1310 Dysphagia, unspecified: Secondary | ICD-10-CM

## 2020-06-29 MED ORDER — SARNA 0.5-0.5 % EX LOTN: 1.0000 "application " | TOPICAL_LOTION | CUTANEOUS | 2 refills | Status: DC | PRN

## 2020-06-29 MED ORDER — PREDNISONE 10 MG PO TABS
10.0000 mg | ORAL_TABLET | Freq: Every day | ORAL | 0 refills | Status: DC
Start: 2020-06-29 — End: 2021-01-23

## 2020-06-29 NOTE — Telephone Encounter (Signed)
Deirdre with [facility] called stating that she would like to know if a Modified barium swallow can be ordered for the patient at [facility] Radiology Department. Deirdre stated that she does not want a regular one ordered but a " Modified" one to check for current aspiration risk.

## 2020-06-29 NOTE — Telephone Encounter (Signed)
Ok for modified barium swallow

## 2020-06-29 NOTE — Progress Notes (Signed)
Subjective:    Patient ID: Christopher Murphy, male    DOB: October 03, 1945, 75 y.o.   MRN: [id number]  HPI  Patient presents the clinic today with complaint of dry skin. He noticed this on his abdomen, arms and legs after having Covid. He reports the dry skin is very itchy. He has tried Triamcinolone cream and Coconut Oil with minimal relief of symptoms.   Review of Systems      Past Medical History:  Diagnosis Date  . Blood clots in brain 2009  . Stroke [facility])     Current Outpatient Medications  Medication Sig Dispense Refill  . ASPIRIN LOW DOSE 81 MG EC tablet TAKE 1 TABLET BY MOUTH EVERY DAY 90 tablet 3  . benzonatate (TESSALON PERLES) 100 MG capsule Take 1 capsule (100 mg total) by mouth 3 (three) times daily as needed for cough. 30 capsule 0  . cetirizine (ZYRTEC) 10 MG tablet Take 1 tablet (10 mg total) by mouth daily. (Patient taking differently: Take 10 mg by mouth daily as needed for allergies or rhinitis.) 30 tablet 11  . clopidogrel (PLAVIX) 75 MG tablet TAKE 1 TABLET BY MOUTH EVERY DAY 90 tablet 0  . donepezil (ARICEPT) 10 MG tablet Take 1 tablet daily (Patient taking differently: Take 10 mg by mouth at bedtime.) 90 tablet 1  . fluticasone (FLONASE) 50 MCG/ACT nasal spray Place 1-2 sprays into both nostrils daily for 7 days. (Patient not taking: No sig reported) 1 g 0  . folic acid (FOLVITE) 1 MG tablet Take 1 tablet (1 mg total) by mouth daily. 30 tablet 0  . memantine (NAMENDA) 10 MG tablet TAKE 1 TABLET BY MOUTH TWICE A DAY (Patient taking differently: Take 10 mg by mouth 2 (two) times daily.) 180 tablet 1  . Polyethyl Glycol-Propyl Glycol (SYSTANE) 0.4-0.3 % SOLN Apply 1 drop to eye 2 (two) times daily. (Patient taking differently: Place 1 drop into both eyes 2 (two) times daily.) 30 mL 5  . polyethylene glycol (MIRALAX / GLYCOLAX) 17 g packet Take 17 g by mouth daily. 14 each 0  . PROAIR HFA 108 (90 Base) MCG/ACT inhaler INHALE 1 TO 2 PUFFS INTO THE LUNGS EVERY 4 TO 6  HOURS AS NEEDED 8.5 each 1  . rosuvastatin (CRESTOR) 20 MG tablet TAKE 1 TABLET BY MOUTH EVERY DAY 90 tablet 0  . triamcinolone (KENALOG) 0.1 % Apply 1 application topically 2 (two) times daily as needed (to affected areas- for itching). 30 g 0   No current facility-administered medications for this visit.    No Known Allergies  Family History  Problem Relation Age of Onset  . AAA (abdominal aortic aneurysm) Neg Hx     Social History   Socioeconomic History  . Marital status: Married    Spouse name: Not on file  . Number of children: Not on file  . Years of education: Not on file  . Highest education level: Not on file  Occupational History  . Not on file  Tobacco Use  . Smoking status: Never Smoker  . Smokeless tobacco: Never Used  Vaping Use  . Vaping Use: Never used  Substance and Sexual Activity  . Alcohol use: No  . Drug use: No  . Sexual activity: Not on file  Other Topics Concern  . Not on file  Social History Narrative   Lives with wife, son and daughter in law + 2 children   Right Handed   Drinks no caffeine   Social Determinants of  Health   Financial Resource Strain: Not on file  Food Insecurity: Not on file  Transportation Needs: Not on file  Physical Activity: Not on file  Stress: Not on file  Social Connections: Not on file  Intimate Partner Violence: Not on file     Constitutional: Denies fever, malaise, fatigue, headache or abrupt weight changes.  Respiratory: Denies difficulty breathing, shortness of breath, cough or sputum production.   Cardiovascular: Denies chest pain, chest tightness, palpitations or swelling in the hands or feet.  Skin: Patient reports dry skin.  Denies redness, rashes, lesions or ulcercations.   No other specific complaints in a complete review of systems (except as listed in HPI above).  Objective:   Physical Exam  BP 112/66   Pulse 72   Wt Readings from Last 3 Encounters:  06/02/20 136 lb 8 oz (61.9 kg)   05/09/20 145 lb 15.1 oz (66.2 kg)  02/25/20 146 lb (66.2 kg)    General: Appears his stated age, well developed, well nourished in NAD. Skin: Excessive dry skin noted on face, arms, abdomen, and legs. He is tenting on BLE. ENT: Mucous membranes moist. Cardiovascular: Normal rate. Pulmonary/Chest: Normal effort. Neurological: Alert and oriented.    BMET    Component Value Date/Time   NA 135 06/02/2020 1309   K 3.7 06/02/2020 1309   CL 99 06/02/2020 1309   CO2 27 06/02/2020 1309   GLUCOSE 93 06/02/2020 1309   BUN 12 06/02/2020 1309   CREATININE 0.74 06/02/2020 1309   CREATININE 0.85 06/12/2019 1529   CALCIUM 8.9 06/02/2020 1309   GFRNONAA >60 05/13/2020 0135   GFRNONAA 83 02/21/2015 1008   GFRAA >60 10/28/2019 0905   GFRAA >89 02/21/2015 1008    Lipid Panel     Component Value Date/Time   CHOL 129 10/29/2019 0444   CHOL 107 08/27/2019 1544   TRIG 76 05/09/2020 1232   HDL 45 10/29/2019 0444   HDL 35 (L) 08/27/2019 1544   CHOLHDL 2.9 10/29/2019 0444   VLDL 18 10/29/2019 0444   LDLCALC 66 10/29/2019 0444   LDLCALC 45 08/27/2019 1544   LDLCALC 45 06/12/2019 1529    CBC    Component Value Date/Time   WBC 7.7 06/02/2020 1309   RBC 4.32 06/02/2020 1309   HGB 11.7 (L) 06/02/2020 1309   HCT 35.7 (L) 06/02/2020 1309   PLT 308.0 06/02/2020 1309   MCV 82.6 06/02/2020 1309   MCH 26.9 05/13/2020 0135   MCHC 32.7 06/02/2020 1309   RDW 14.8 06/02/2020 1309   LYMPHSABS 1.5 06/02/2020 1309   MONOABS 0.8 06/02/2020 1309   EOSABS 0.2 06/02/2020 1309   BASOSABS 0.1 06/02/2020 1309    Hgb A1C Lab Results  Component Value Date   HGBA1C 5.3 10/29/2019            Assessment & Plan:   Dry Skin Dermatitis, Hx of Covid 19:  RX for Sarna Lotion BID Ok to continue to use Triamcinolone and Coconut Oil RX for Prednisone 10 mg daily x 5 days Encouraged adequate water intake Will check BMET, TSH, Vit D and B12 Consider referral to dermatology pending labs  Will  follow up after labs, return precautions discussed   Webb Silversmith, NP This visit occurred during the SARS-CoV-2 public health emergency.  Safety protocols were in place, including screening questions prior to the visit, additional usage of staff PPE, and extensive cleaning of exam room while observing appropriate contact time as indicated for disinfecting solutions.

## 2020-06-29 NOTE — Patient Instructions (Signed)
Eczema Eczema refers to a group of skin conditions that cause skin to become rough and inflamed. Each type of eczema has different triggers, symptoms, and treatments. Eczema of any type is usually itchy. Symptoms range from mild to severe. Eczema is not spread from person to person (is not contagious). It can appear on different parts of the body at different times. One person's eczema may look different from another person's eczema. What are the causes? The exact cause of this condition is not known. However, exposure to certain environmental factors, irritants, and allergens can make the condition worse. What are the signs or symptoms? Symptoms of this condition depend on the type of eczema you have. The types include:  Contact dermatitis. There are two kinds: ? Irritant contact dermatitis. This happens when something irritates the skin and causes a rash. ? Allergic contact dermatitis. This happens when your skin comes in contact with something you are allergic to (allergens). This can include poison ivy, chemicals, or medicines that were applied to your skin.  Atopic dermatitis. This is a long-term (chronic) skin disease that keeps coming back (recurring). It is the most common type of eczema. Usual symptoms are a red rash and itchy, dry, scaly skin. It usually starts showing signs in infancy and can last through adulthood.  Dyshidrotic eczema. This is a form of eczema on the hands and feet. It shows up as very itchy, fluid-filled blisters. It can affect people of any age but is more common before age 40.  Hand eczema. This causes very itchy areas of skin on the palms and sides of the hands and fingers. This type of eczema is common in industrial jobs where you may be exposed to different types of irritants.  Lichen simplex chronicus. This type of eczema occurs when a person constantly scratches one area of the body. Repeated scratching of the area leads to thickened skin (lichenification). This  condition can accompany other types of eczema. It is more common in adults but may also be seen in children.  Nummular eczema. This is a common type of eczema that most often affects the lower legs and the backs of the hands. It typically causes an itchy, red, circular, crusty lesion (plaque). Scratching may become a habit and can cause bleeding. Nummular eczema occurs most often in middle-aged or older people.  Seborrheic dermatitis. This is a common skin disease that mainly affects the scalp. It may also affect other oily areas of the body, such as the face, sides of the nose, eyebrows, ears, eyelids, and chest. It is marked by small scaling and redness of the skin (erythema). This can affect people of all ages. In infants, this condition is called cradle cap.  Stasis dermatitis. This is a common skin disease that can cause itching, scaling, and hyperpigmentation, usually on the legs and feet. It occurs most often in people who have a condition that prevents blood from being pumped through the veins in the legs (chronic venous insufficiency). Stasis dermatitis is a chronic condition that needs long-term management.   How is this diagnosed? This condition may be diagnosed based on:  A physical exam of your skin.  Your medical history.  Skin patch tests. These tests involve using patches that contain possible allergens and placing them on your back. Your health care provider will check in a few days to see if an allergic reaction occurred. How is this treated? Treatment for eczema is based on the type of eczema you have. You may be   given hydrocortisone steroid medicine or antihistamines. These can relieve itching quickly and help reduce inflammation. These may be prescribed or purchased over the counter, depending on the strength that is needed. Follow these instructions at home:  Take or apply over-the-counter and prescription medicines only as told by your health care provider.  Use creams or  ointments to moisturize your skin. Do not use lotions.  Learn what triggers or irritates your symptoms so you can avoid these things.  Treat symptom flare-ups quickly.  Do not scratch your skin. This can make your rash worse.  Keep all follow-up visits. This is important. Where to find more information  American Academy of Dermatology: aad.org  National Eczema Association: nationaleczema.org  The Society for Pediatric Dermatology: pedsderm.net Contact a health care provider if:  You have severe itching, even with treatment.  You scratch your skin regularly until it bleeds.  Your rash looks different than usual.  Your skin is painful, swollen, or more red than usual.  You have a fever. Summary  Eczema refers to a group of skin conditions that cause skin to become rough and inflamed. Each type has different triggers.  Eczema of any type causes itching that may range from mild to severe.  Treatment varies based on the type of eczema you have. Hydrocortisone steroid medicine or antihistamines can help with itching and inflammation.  Protecting your skin is the best way to prevent eczema. Use creams or ointments to moisturize your skin. Avoid triggers and irritants. Treat flare-ups quickly. This information is not intended to replace advice given to you by your health care provider. Make sure you discuss any questions you have with your health care provider. Document Revised: 01/04/2020 Document Reviewed: 01/04/2020 Elsevier Patient Education  2021 Elsevier Inc.  

## 2020-06-30 LAB — VITAMIN D 25 HYDROXY (VIT D DEFICIENCY, FRACTURES): VITD: 21.29 ng/mL — ABNORMAL LOW (ref 30.00–100.00)

## 2020-06-30 LAB — TSH: TSH: 1.19 u[IU]/mL (ref 0.35–4.50)

## 2020-06-30 LAB — BASIC METABOLIC PANEL
BUN: 13 mg/dL (ref 6–23)
CO2: 29 mEq/L (ref 19–32)
Calcium: 8.9 mg/dL (ref 8.4–10.5)
Chloride: 102 mEq/L (ref 96–112)
Creatinine, Ser: 0.73 mg/dL (ref 0.40–1.50)
GFR: 89.57 mL/min (ref >60.00)
Glucose, Bld: 108 mg/dL — ABNORMAL HIGH (ref 70–99)
Potassium: 4.4 mEq/L (ref 3.5–5.1)
Sodium: 138 mEq/L (ref 135–145)

## 2020-06-30 LAB — VITAMIN B12: Vitamin B-12: 366 pg/mL (ref 211–911)

## 2020-07-01 ENCOUNTER — Encounter: Payer: Self-pay | Admitting: Internal Medicine

## 2020-07-06 NOTE — Addendum Note (Signed)
Addended by: [name] on: 07/06/2020 11:29 PM   Modules accepted: Orders

## 2020-07-06 NOTE — Telephone Encounter (Signed)
Barium swallow ordered

## 2020-07-06 NOTE — Telephone Encounter (Signed)
Christopher Murphy said you have to put in the order as modified barium swallow to [facility]

## 2020-07-13 ENCOUNTER — Telehealth: Payer: Self-pay

## 2020-07-13 NOTE — Telephone Encounter (Signed)
Form was received yesterday and I took from tower today--will have Dr Silvio Pate sign and fax back tomorrow

## 2020-07-13 NOTE — Telephone Encounter (Signed)
Mariann Laster with [facility] requesting status of faxed order for speech therapy that was faxed initially to [phone] on 06/10/20. Mariann Laster said multiple fax have been sent with most recent on 07/11/20. Mariann Laster request cb. Sending note to [facility] CMA.

## 2020-07-18 ENCOUNTER — Telehealth: Payer: Self-pay

## 2020-07-18 NOTE — Telephone Encounter (Signed)
VO given.

## 2020-07-18 NOTE — Telephone Encounter (Signed)
Ok to change PT discharge date

## 2020-07-18 NOTE — Telephone Encounter (Signed)
Needing verbal orders to move PT discharge date to tomorrow (4/12). Please advise.

## 2020-07-20 ENCOUNTER — Other Ambulatory Visit: Payer: Self-pay | Admitting: Internal Medicine

## 2020-08-03 ENCOUNTER — Telehealth: Payer: Self-pay

## 2020-08-03 NOTE — Telephone Encounter (Signed)
Mariann Laster with [facility] request status of 3 previously faxed orders with order numbers of [id number] on 06/10/20; order # of [id number] on 06/17/20 and order # [id number] on 06/29/20. Do not see these order #s in scanned material and Melanie CMA said she does not have those order #s on her desk. Melanie request Alvis Lemmings to fax to [phone]. Mariann Laster will refax orders to [phone]. Sending note to [facility] CMA.

## 2020-08-08 ENCOUNTER — Other Ambulatory Visit: Payer: Self-pay

## 2020-08-08 ENCOUNTER — Ambulatory Visit: Payer: Medicare Other | Admitting: Neurology

## 2020-08-08 ENCOUNTER — Encounter: Payer: Self-pay | Admitting: Neurology

## 2020-08-08 VITALS — BP 118/76 | HR 83 | Ht 69.0 in | Wt 136.9 lb

## 2020-08-08 DIAGNOSIS — F0150 Vascular dementia without behavioral disturbance: Secondary | ICD-10-CM | POA: Diagnosis not present

## 2020-08-08 DIAGNOSIS — Z8673 Personal history of transient ischemic attack (TIA), and cerebral infarction without residual deficits: Secondary | ICD-10-CM | POA: Diagnosis not present

## 2020-08-08 NOTE — Patient Instructions (Signed)
I had a long discussion the patient and his daughter-in-law regarding his mild vascular dementia and remote stroke and recommend he continue Aricept 10 mg daily as well as Namenda 10 mg twice daily.  I also advised him to continue participation in cognitively challenging activities like solving crossword puzzles, playing bridge and sodoku.  He was advised to continue aspirin for stroke prevention and maintain aggressive risk factor modification with strict control of hypertension with blood pressure goal below 130/90 and lipids with LDL cholesterol goal below 70 mg percent.  He will return for follow-up in the future in 6 months or call earlier if necessary. 

## 2020-08-08 NOTE — Progress Notes (Signed)
[facility] [address] [place]. [place] [address] [phone]       OFFICE FOLLOW UP VISIT NOTE  Mr. Tywone Bembenek Date of Birth:  02/16/1946 Medical Record Number:  [id number]   Referring MD: Webb Silversmith, NP Reason for Referral: TIA HPI: Initial visit 03/11/2018: [name] is a pleasant 75 year old [place] Sikh male who seen today for initial office consultation visit.  He is accompanied by his son and daughter-in-law who provide most of the history.  I have reviewed imaging films in PACS.  The patient has had recurrent transient episodes of speech difficulties for the last 10 years.  The episodes are stereotypical.  The patient struggles to speak at times and get words out.  At times his speech is gibberish and nonsensical.  These typically last only few minutes around 5-10 or so but he has had a couple episodes which lasted for an hour or longer.  During these prolonged episodes he is been found to be confused and disoriented.  Interestingly patient had a similar episode on 04/05/2016 when he was admitted to [facility].  He had MRI scan of the brain at that time which I personally reviewed and shows interesting diffuse hyperintensity throughout the cerebral white matter raising concern for seizures.  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  EEG was apparently not done.  Patient was not started on seizure medications.  Patient has history of left posterior cerebral artery infarct in 2009 in [place].  He was placed on a combination tablet of aspirin and Plavix as well as medication for lipids.  He had cognitive impairment and mild memory loss and was started on Namenda as well.  He is presently on Namenda 5 mg twice daily.  Family states that he is cognitively stable and does have short-term memory difficulties but long-term memory is good.  He is living  at home with family.  He needs only mild help with activities of daily living.  He can ambulate independently.  He has not had any witnessed generalized tonic-clonic seizure significant head injury or loss of consciousness.  The patient actually had gone to [place] following this episode in December 2017 for nearly 3 years before he came back.  He was seen by neurologist there but details are not known.  He was never placed on seizure medications. Update 08/27/2019 : He returns for follow-up after last visit a year and half ago.  Is accompanied by his son.  The patient continues to have mild memory and cognitive difficulties which appear to be unchanged and not particularly progressive.  He remains on Namenda 5 mg twice daily which is tolerating well.  He has never tried a higher dose.  Patient is not had many episodes of transient speech difficulties since her last visit.  He did undergo MRI scan of the brain on 03/31/2018 which showed old left PCA as well as few remote age lacunar infarcts and changes of small vessel disease.  No acute abnormalities.  MRA of the brain and neck both did not show any large vessel stenosis or occlusion.  EEG on 03/25/2018 was normal.  Patient had a colonoscopy done on 04/30/2019 and had a 3 mm polyp excised.  He has been having some swallowing difficulties of late and saw gastroenterologist in [place].  Modified barium swallow was done by speech therapy there but I do not have the report but the  patient's son was informed that it was fine.  He has no new complaints. Update 11/17/2019 : He returns for follow-up after last visit 3 months ago.  Is accompanied by his daughter-in-law.  They state that he had a episode of increasing confusion and hallucination as well as delusion lasting 3 days following a bladder infection and treatment with antibiotics.  He was admitted to the hospital and UA showed mild microscopic hematuria and he had recent UTI treatment.  Chest x-ray was  unremarkable.  Chest CT was also done which showed no significant abnormalities.  MRI scan of the brain showing progressive cerebral atrophy and old left PCA infarct but no acute abnormality.  2D echo showed normal ejection fraction.  Carotid ultrasound showed no significant extracranial stenosis.  His confusion improved over 3 days and he has done very well for the last week or so now and is at home.  Is back to his baseline.  He has a renal ultrasound pending for his hematuria.  He remains on aspirin and Plavix which is tolerating well without any external bleeding or bruising.  Mini-Mental status exam today scored 18/30 with deficits in orientation and recall.  He was able to copy intersecting pentagons but difficulty naming animals.  He has not been tried on Aricept so far.  He has not had recurrent TIA or stroke symptoms.  He has been taking Namenda 10 mg twice daily now for several months but family has not noticed any benefit.  Patient is currently not having any delusions, hallucinations, agitation or unsafe behavior.  He can walk independently but his gait has become more stooped but he has not had falls or injuries. Update 02/08/2020: He returns for follow-up after last visit to enough months ago.  Is accompanied by his daughter-in-law.  Patient has not had any further episodes of confusion hallucinations or delusions.  He is tolerating Aricept 10 mg daily well but feels appetite may have gone down.  He is however not lost weight.  He is cognitively unchanged and there has been no specific improvement noted.  Is tolerating both Namenda and Aricept fairly well.  Has had no recurrent stroke or TIA symptoms.  He remains on aspirin and Crestor both of which is tolerating well.  Blood pressures well controlled today it is 120/67.  He has no new complaints. Update 08/08/2020 : He returns for follow-up after last visit 6 months ago.  He is accompanied by his daughter-in-law.  Patient continues to have cognitive  impairment which appears to have worsened slightly after recent COVID infection in January 2022.Christopher Murphy  Patient had some aspiration pneumonia during that hospitalization as well.  He was quite confused and disoriented but gradually seems to have returned almost back to his baseline.  He still needs constant supervision at home.  He is lost some of independence in activities like dressing himself and cleaning himself which he was previously able to do by himself.  He can still walk independently.  He had some trouble swallowing and speech therapy has recommended that he eat soft food only.  He has had no recurrent stroke or TIA.  He remains on aspirin which is tolerating well without side effects.  He is also tolerating Aricept and Namenda without side effects.  Blood pressure is well controlled and today it is 118/76.  ROS:   14 system review of systems is positive for speech difficulties, memory loss, confusion, decreased attention, disorientation decreased appetite,and all other systems negative  PMH:  Past Medical  History:  Diagnosis Date  . Blood clots in brain 2009  . Stroke [facility])     Social History:  Social History   Socioeconomic History  . Marital status: Married    Spouse name: Not on file  . Number of children: Not on file  . Years of education: Not on file  . Highest education level: Not on file  Occupational History  . Not on file  Tobacco Use  . Smoking status: Never Smoker  . Smokeless tobacco: Never Used  Vaping Use  . Vaping Use: Never used  Substance and Sexual Activity  . Alcohol use: No  . Drug use: No  . Sexual activity: Not on file  Other Topics Concern  . Not on file  Social History Narrative   Lives with wife, son and daughter in law + 2 children   Right Handed   Drinks no caffeine   Social Determinants of Health   Financial Resource Strain: Not on file  Food Insecurity: Not on file  Transportation Needs: Not on file  Physical Activity: Not on file   Stress: Not on file  Social Connections: Not on file  Intimate Partner Violence: Not on file    Medications:   Current Outpatient Medications on File Prior to Visit  Medication Sig Dispense Refill  . ASPIRIN LOW DOSE 81 MG EC tablet TAKE 1 TABLET BY MOUTH EVERY DAY 90 tablet 3  . camphor-menthol (SARNA) lotion Apply 1 application topically as needed for itching. 222 mL 2  . cetirizine (ZYRTEC) 10 MG tablet Take 1 tablet (10 mg total) by mouth daily. (Patient taking differently: Take 10 mg by mouth daily as needed for allergies or rhinitis.) 30 tablet 11  . donepezil (ARICEPT) 10 MG tablet Take 1 tablet daily (Patient taking differently: Take 10 mg by mouth at bedtime.) 90 tablet 1  . memantine (NAMENDA) 10 MG tablet TAKE 1 TABLET BY MOUTH TWICE A DAY (Patient taking differently: Take 10 mg by mouth 2 (two) times daily.) 180 tablet 1  . Polyethyl Glycol-Propyl Glycol (SYSTANE) 0.4-0.3 % SOLN Apply 1 drop to eye 2 (two) times daily. (Patient taking differently: Place 1 drop into both eyes 2 (two) times daily.) 30 mL 5  . polyethylene glycol (MIRALAX / GLYCOLAX) 17 g packet Take 17 g by mouth daily. 14 each 0  . PROAIR HFA 108 (90 Base) MCG/ACT inhaler INHALE 1 TO 2 PUFFS INTO THE LUNGS EVERY 4 TO 6 HOURS AS NEEDED 8.5 each 1  . rosuvastatin (CRESTOR) 20 MG tablet TAKE 1 TABLET BY MOUTH EVERY DAY 90 tablet 0  . triamcinolone (KENALOG) 0.1 % Apply 1 application topically 2 (two) times daily as needed (to affected areas- for itching). 30 g 0  . benzonatate (TESSALON PERLES) 100 MG capsule Take 1 capsule (100 mg total) by mouth 3 (three) times daily as needed for cough. 30 capsule 0  . fluticasone (FLONASE) 50 MCG/ACT nasal spray Place 1-2 sprays into both nostrils daily for 7 days. (Patient not taking: No sig reported) 1 g 0  . folic acid (FOLVITE) 1 MG tablet Take 1 tablet (1 mg total) by mouth daily. 30 tablet 0  . predniSONE (DELTASONE) 10 MG tablet Take 1 tablet (10 mg total) by mouth daily  with breakfast. 5 tablet 0   No current facility-administered medications on file prior to visit.    Allergies:  No Known Allergies  Physical Exam General: Frail elderly [place] Sikh male, seated, in no evident distress Head:  head normocephalic and atraumatic.   Neck: supple with no carotid or supraclavicular bruits Cardiovascular: regular rate and rhythm, soft ejection systolic murmur. Musculoskeletal: no deformity Skin:  no rash/petichiae Vascular:  Normal pulses all extremities  Neurologic Exam Mental Status: Awake and fully alert. Oriented to place and time. Recent and remote memory diminished. Attention span, concentration and fund of knowledge slightly reduced.  Mini-Mental status exam not done.  Diminished recall 1/3.  Mood and affect appropriate.  Able to name only 5 animals which can walk on 4 legs.  Unable to copy intersecting pentagons..  Clock drawing not done.  Mini-Mental status exam scored 15/30 with deficits in orientation, attention, calculation recall and reading and writing. Cranial Nerves: Fundoscopic exam reveals sharp disc margins. Pupils equal, briskly reactive to light. Extraocular movements full without nystagmus mild mechanical ptosis of the left eye.. 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 Hearing intact. Facial sensation intact. Face, tongue, palate moves normally and symmetrically.  Motor: Normal bulk and tone. Normal strength in all tested extremity muscles.  Diminished fine finger movements on the right.  Orbits left over right upper extremity.  Increased tone in the right leg with stiffness and mild ankle dorsiflexor weakness. Sensory.: intact to touch , pinprick , position and vibratory sensation.  Coordination: Rapid alternating movements normal in all extremities. Finger-to-nose and heel-to-shin performed accurately bilaterally. Gait and Station: Arises from chair without difficulty. Stance is slightly stooped l.  Gait demonstrates some festination and initiation apraxia and short steps with stiffness and dragging of the right leg...  Drags right leg slightly while walking.   Reflexes: 1+ and symmetric. Toes downgoing.       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  Vascular risk factors of hyperlipidemia, age and cerebrovascular disease.     PLAN: I had a long discussion the patient and his daughter-in-law regarding his mild vascular dementia and remote stroke and recommend he continue Aricept 10 mg daily as well as Namenda 10 mg twice daily.  I also advised him to continue participation in cognitively challenging activities like solving crossword puzzles, playing bridge and sodoku.  He was advised to continue aspirin for stroke prevention and maintain aggressive risk factor modification with strict control of hypertension with blood pressure goal below 130/90 and lipids with LDL cholesterol goal below 70 mg percent.  He will return for follow-up in the future in 6 months or call earlier if necessary.Greater than 50% time during this 25-minute visit was spent on counseling and coordination of care about his episode of confusion, remote TIA and mild dementia and answering questions. Antony Contras, MD  [facility] [address] [place], [place] [address]  Phone [phone] Fax [phone] Note: This document was prepared with digital dictation and possible smart phrase technology. Any transcriptional errors that result from this process are unintentional.

## 2020-08-10 NOTE — Telephone Encounter (Signed)
Received call from Mariann Laster that orders had not been received. Faxed all orders to her

## 2020-08-23 ENCOUNTER — Other Ambulatory Visit: Payer: Self-pay | Admitting: Internal Medicine

## 2020-08-23 NOTE — Telephone Encounter (Signed)
   Notes to clinic Not a practice we approve rx for.

## 2020-09-12 ENCOUNTER — Other Ambulatory Visit: Payer: Self-pay | Admitting: Neurology

## 2020-09-23 ENCOUNTER — Other Ambulatory Visit: Payer: Self-pay | Admitting: Neurology

## 2020-12-23 ENCOUNTER — Other Ambulatory Visit: Payer: Self-pay | Admitting: Internal Medicine

## 2020-12-23 NOTE — Telephone Encounter (Signed)
Requested medication (s) are due for refill today - unsure  Requested medication (s) are on the active medication list -yes  Future visit scheduled -no  Last refill: 05/28/20  Notes to clinic: Request RF: Last seen by provider 06/29/20 at [facility] location- no follow up with provider since location change  Requested Prescriptions  Pending Prescriptions Disp Refills   triamcinolone cream (KENALOG) 0.1 % [Pharmacy Med Name: TRIAMCINOLONE 0.1% CREAM] 30 g 0    Sig: APPLY TO AFFECTED AREA TWICE A DAY AS NEEDED FOR [place]     Dermatology:  Corticosteroids Failed - 12/23/2020  2:44 PM      Failed - Valid encounter within last 12 months    Recent Outpatient Visits   None               Requested Prescriptions  Pending Prescriptions Disp Refills   triamcinolone cream (KENALOG) 0.1 % [Pharmacy Med Name: TRIAMCINOLONE 0.1% CREAM] 30 g 0    Sig: APPLY TO AFFECTED AREA TWICE A DAY AS NEEDED FOR [place]     Dermatology:  Corticosteroids Failed - 12/23/2020  2:44 PM      Failed - Valid encounter within last 12 months    Recent Outpatient Visits   None

## 2021-01-06 ENCOUNTER — Encounter: Payer: Self-pay | Admitting: Internal Medicine

## 2021-01-14 ENCOUNTER — Other Ambulatory Visit: Payer: Self-pay | Admitting: Internal Medicine

## 2021-01-15 NOTE — Telephone Encounter (Signed)
Requested medication (s) are due for refill today: no  Requested medication (s) are on the active medication list: yes  Last refill:  12/23/20 #30  Future visit scheduled: no  Notes to clinic:  no appts made at [facility] or [facility]  [facility] filled last fill   Requested Prescriptions  Pending Prescriptions Disp Refills   triamcinolone cream (KENALOG) 0.1 % [Pharmacy Med Name: TRIAMCINOLONE 0.1% CREAM] 30 g 0    Sig: APPLY TO AFFECTED AREA TWICE A DAY AS NEEDED FOR [place]     Dermatology:  Corticosteroids Failed - 01/14/2021 12:49 PM      Failed - Valid encounter within last 12 months    Recent Outpatient Visits   None

## 2021-01-17 ENCOUNTER — Encounter: Payer: Self-pay | Admitting: Internal Medicine

## 2021-01-17 MED ORDER — ROSUVASTATIN CALCIUM 20 MG PO TABS: 20.0000 mg | ORAL_TABLET | Freq: Every day | ORAL | 0 refills | Status: DC

## 2021-01-17 NOTE — Telephone Encounter (Signed)
It keeps getting denied because he is past due for an appt, he needs to schedule a followup. Will send in for 30 days

## 2021-01-23 ENCOUNTER — Encounter: Payer: Self-pay | Admitting: Internal Medicine

## 2021-01-23 ENCOUNTER — Ambulatory Visit (INDEPENDENT_AMBULATORY_CARE_PROVIDER_SITE_OTHER): Payer: Medicare Other | Admitting: Internal Medicine

## 2021-01-23 ENCOUNTER — Other Ambulatory Visit: Payer: Self-pay

## 2021-01-23 VITALS — BP 106/60 | HR 84 | Temp 97.5°F | Resp 17 | Ht 69.0 in | Wt 136.0 lb

## 2021-01-23 DIAGNOSIS — G459 Transient cerebral ischemic attack, unspecified: Secondary | ICD-10-CM

## 2021-01-23 DIAGNOSIS — D508 Other iron deficiency anemias: Secondary | ICD-10-CM | POA: Diagnosis not present

## 2021-01-23 DIAGNOSIS — I700 Atherosclerosis of aorta: Secondary | ICD-10-CM | POA: Diagnosis not present

## 2021-01-23 DIAGNOSIS — F01B0 Vascular dementia, moderate, without behavioral disturbance, psychotic disturbance, mood disturbance, and anxiety: Secondary | ICD-10-CM

## 2021-01-23 DIAGNOSIS — Z23 Encounter for immunization: Secondary | ICD-10-CM

## 2021-01-23 DIAGNOSIS — I7121 Aneurysm of the ascending aorta, without rupture: Secondary | ICD-10-CM

## 2021-01-23 DIAGNOSIS — R053 Chronic cough: Secondary | ICD-10-CM

## 2021-01-23 LAB — COMPLETE METABOLIC PANEL WITH GFR
AG Ratio: 1.3 (calc) (ref 1.0–2.5)
ALT: 6 U/L — ABNORMAL LOW (ref 9–46)
AST: 12 U/L (ref 10–35)
Albumin: 4 g/dL (ref 3.6–5.1)
Alkaline phosphatase (APISO): 65 U/L (ref 35–144)
BUN: 10 mg/dL (ref 7–25)
CO2: 30 mmol/L (ref 20–32)
Calcium: 9.4 mg/dL (ref 8.6–10.3)
Chloride: 102 mmol/L (ref 98–110)
Creat: 0.89 mg/dL (ref 0.70–1.28)
Globulin: 3.1 g/dL (calc) (ref 1.9–3.7)
Glucose, Bld: 103 mg/dL — ABNORMAL HIGH (ref 65–99)
Potassium: 4.5 mmol/L (ref 3.5–5.3)
Sodium: 139 mmol/L (ref 135–146)
Total Bilirubin: 0.5 mg/dL (ref 0.2–1.2)
Total Protein: 7.1 g/dL (ref 6.1–8.1)
eGFR: 89 mL/min/{1.73_m2} (ref >=60)

## 2021-01-23 LAB — CBC
HCT: 44 % (ref 38.5–50.0)
Hemoglobin: 14 g/dL (ref 13.2–17.1)
MCH: 26.5 pg — ABNORMAL LOW (ref 27.0–33.0)
MCHC: 31.8 g/dL — ABNORMAL LOW (ref 32.0–36.0)
MCV: 83.2 fL (ref 80.0–100.0)
MPV: 10.5 fL (ref 7.5–12.5)
Platelets: 249 10*3/uL (ref 140–400)
RBC: 5.29 10*6/uL (ref 4.20–5.80)
RDW: 13.1 % (ref 11.0–15.0)
WBC: 6.8 10*3/uL (ref 3.8–10.8)

## 2021-01-23 LAB — LIPID PANEL
Cholesterol: 147 mg/dL (ref <200)
HDL: 51 mg/dL (ref >=40)
LDL Cholesterol (Calc): 75 mg/dL (calc)
Non-HDL Cholesterol (Calc): 96 mg/dL (calc) (ref <130)
Total CHOL/HDL Ratio: 2.9 (calc) (ref <5.0)
Triglycerides: 131 mg/dL (ref <150)

## 2021-01-23 MED ORDER — CLOPIDOGREL BISULFATE 75 MG PO TABS: 75.0000 mg | ORAL_TABLET | Freq: Every day | ORAL | 1 refills | Status: DC

## 2021-01-23 MED ORDER — ROSUVASTATIN CALCIUM 20 MG PO TABS: 20.0000 mg | ORAL_TABLET | Freq: Every day | ORAL | 1 refills | Status: DC

## 2021-01-23 NOTE — Assessment & Plan Note (Signed)
Continue Rosuvastatin and Plavix Encourage low-fat diet

## 2021-01-23 NOTE — Assessment & Plan Note (Signed)
CBC today.  

## 2021-01-23 NOTE — Assessment & Plan Note (Signed)
Currently not medicated He has Zyrtec and Albuterol as needed

## 2021-01-23 NOTE — Assessment & Plan Note (Signed)
Continue Rosuvastatin and Plavix Encourage low-fat diet He will continue to follow with neurology

## 2021-01-23 NOTE — Assessment & Plan Note (Signed)
Declining Continue Donepezil and Memantine Family would like to keep him at home as long as possible He will continue to follow with neurology

## 2021-01-23 NOTE — Patient Instructions (Signed)
Dementia Caregiver Guide Dementia is a term used to describe a number of symptoms that affect memory and thinking. The most common symptoms include: Memory loss. Trouble with language and communication. Trouble concentrating. Poor judgment and problems with reasoning. Wandering from home or public places. Extreme anxiety or depression. Being suspicious or having angry outbursts and accusations. Child-like behavior and language. Dementia can be frightening and confusing. And taking care of someone with dementia can be challenging. This guide provides tips to help you when providing care for a person with dementia. How to help manage lifestyle changes Dementia usually gets worse slowly over time. In the early stages, people with dementia can stay independent and safe with some help. In later stages, they need help with daily tasks such as dressing, grooming, and using the bathroom. There are actions you can take to help a person manage his or her life while living with this condition. Communicating When the person is talking or seems frustrated, make eye contact and hold the person's hand. Ask specific questions that need yes or no answers. Use simple words, short sentences, and a calm voice. Only give one direction at a time. When offering choices, limit the person to just one or two. Avoid correcting the person in a negative way. If the person is struggling to find the right words, gently try to help him or her. Preventing injury  Keep floors clear of clutter. Remove rugs, magazine racks, and floor lamps. Keep hallways well lit, especially at night. Put a handrail and nonslip mat in the bathtub or shower. Put childproof locks on cabinets that contain dangerous items, such as medicines, alcohol, guns, toxic cleaning items, sharp tools or utensils, matches, and lighters. For doors to the outside of the house, put the locks in places where the person cannot see or reach them easily. This will  help ensure that the person does not wander out of the house and get lost. Be prepared for emergencies. Keep a list of emergency phone numbers and addresses in a convenient area. Remove car keys and lock garage doors so that the person does not try to get in the car and drive. Have the person wear a bracelet that tracks locations and identifies the person as having memory problems. This should be worn at all times for safety. Helping with daily life  Keep the person on track with his or her routine. Try to identify areas where the person may need help. Be supportive, patient, calm, and encouraging. Gently remind the person that adjusting to changes takes time. Help with the tasks that the person has asked for help with. Keep the person involved in daily tasks and decisions as much as possible. Encourage conversation, but try not to get frustrated if the person struggles to find words or does not seem to appreciate your help. How to recognize stress Look for signs of stress in yourself and in the person you are caring for. If you notice signs of stress, take steps to manage it. Symptoms of stress include: Feeling anxious, irritable, frustrated, or angry. Denying that the person has dementia or that his or her symptoms will not improve. Feeling depressed, hopeless, or unappreciated. Difficulty sleeping. Difficulty concentrating. Developing stress-related health problems. Feeling like you have too little time for your own life. Follow these instructions at home: Take care of your health Make sure that you and the person you are caring for: Get regular sleep. Exercise regularly. Eat regular, nutritious meals. Take over-the-counter and prescription medicines only   as told by your health care providers. Drink enough fluid to keep your urine pale yellow. Attend all scheduled health care appointments.  General instructions Join a support group with others who are caregivers. Ask about  respite care resources. Respite care can provide short-term care for the person so that you can have a regular break from the stress of caregiving. Consider any safety risks and take steps to avoid them. Organize medicines in a pill box for each day of the week. Create a plan to handle any legal or financial matters. Get legal or financial advice if needed. Keep a calendar in a central location to remind the person of appointments or other activities. Where to find support: Many individuals and organizations offer support. These include: Support groups for people with dementia. Support groups for caregivers. Counselors or therapists. Home health care services. Adult day care centers. Where to find more information Centers for Disease Control and Prevention: http://www.wolf.info/ Alzheimer's Association: [place].[employer].nz Family Caregiver Alliance: www.caregiver.[place]: www.alzfdn.org Contact a health care provider if: The person's health is rapidly getting worse. You are no longer able to care for the person. Caring for the person is affecting your physical and emotional health. You are feeling depressed or anxious about caring for the person. Get help right away if: The person threatens himself or herself, you, or anyone else. You feel depressed or sad, or feel that you want to harm yourself. If you ever feel like your loved one may hurt himself or herself or others, or if he or she shares thoughts about taking his or her own life, get help right away. You can go to your nearest emergency department or: Call your local emergency services (911 in the [place] Call a suicide crisis helpline, such as the [place] at 2534985138. This is open 24 hours a day in the [place] Text the Crisis Text Line at 9521362664 (in the [place].). Summary Dementia is a term used to describe a number of symptoms that affect memory and thinking. Dementia usually gets worse  slowly over time. Take steps to reduce the person's risk of injury and to plan for future care. Caregivers need support, relief from caregiving, and time for their own lives. This information is not intended to replace advice given to you by your health care provider. Make sure you discuss any questions you have with your health care provider. Document Revised: 08/10/2019 Document Reviewed: 08/10/2019 Elsevier Patient Education  [place].

## 2021-01-23 NOTE — Progress Notes (Signed)
Subjective:    Patient ID: Christopher Murphy, male    DOB: 09/09/1945, 75 y.o.   MRN: [id number]  HPI  Patient presents the clinic today for follow-up of chronic conditions.  HLD with Aortic Atherosclerosis/History of TIA: Mainly cognitive.  His last LDL was 66, triglycerides 89, 10/2019.  He is taking Rosuvastatin and Plavix as prescribed. He stopped taking ASA.  He follows with neurology.  Thoracic Aortic Aneurysm: CT of the chest from 04/2020 reviewed.  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 He needs assistance with bathing and dressing. He can feed himself. He is occasionally incontinent of bowel and bladder. He is taking Donepezil and Memantine as prescribed.  He follows with neurology.  Chronic Cough: Intermittent. He is not longer taking anything for this. There are no PFT's on file.  Anemia: His last H/H was 11.7/35.7, 05/2020.  He is not taking an iron supplement at this time.  He does not follow with hematology.  Review of Systems  Past Medical History:  Diagnosis Date   Blood clots in brain 2009   Stroke [facility])     Current Outpatient Medications  Medication Sig Dispense Refill   ASPIRIN LOW DOSE 81 MG EC tablet TAKE 1 TABLET BY MOUTH EVERY DAY 90 tablet 3   benzonatate (TESSALON PERLES) 100 MG capsule Take 1 capsule (100 mg total) by mouth 3 (three) times daily as needed for cough. 30 capsule 0   camphor-menthol (SARNA) lotion Apply 1 application topically as needed for itching. 222 mL 2   cetirizine (ZYRTEC) 10 MG tablet Take 1 tablet (10 mg total) by mouth daily. (Patient taking differently: Take 10 mg by mouth daily as needed for allergies or rhinitis.) 30 tablet 11   donepezil (ARICEPT) 10 MG tablet Take 1 tablet (10 mg total) by mouth at bedtime. 90 tablet 1   fluticasone (FLONASE) 50 MCG/ACT nasal spray Place 1-2 sprays into both nostrils daily for 7 days. (Patient not taking: No sig reported) 1 g 0   folic acid (FOLVITE) 1 MG tablet Take 1 tablet (1 mg total) by  mouth daily. 30 tablet 0   memantine (NAMENDA) 10 MG tablet TAKE 1 TABLET BY MOUTH TWICE A DAY 180 tablet 3   Polyethyl Glycol-Propyl Glycol (SYSTANE) 0.4-0.3 % SOLN Apply 1 drop to eye 2 (two) times daily. (Patient taking differently: Place 1 drop into both eyes 2 (two) times daily.) 30 mL 5   polyethylene glycol (MIRALAX / GLYCOLAX) 17 g packet Take 17 g by mouth daily. 14 each 0   predniSONE (DELTASONE) 10 MG tablet Take 1 tablet (10 mg total) by mouth daily with breakfast. 5 tablet 0   PROAIR HFA 108 (90 Base) MCG/ACT inhaler INHALE 1 TO 2 PUFFS INTO THE LUNGS EVERY 4 TO 6 HOURS AS NEEDED 8.5 each 1   rosuvastatin (CRESTOR) 20 MG tablet Take 1 tablet (20 mg total) by mouth daily. 90 tablet 0   triamcinolone cream (KENALOG) 0.1 % APPLY TO AFFECTED AREA TWICE A DAY AS NEEDED FOR ITCHING 80 g 0   No current facility-administered medications for this visit.    No Known Allergies  Family History  Problem Relation Age of Onset   AAA (abdominal aortic aneurysm) Neg Hx     Social History   Socioeconomic History   Marital status: Married    Spouse name: Not on file   Number of children: Not on file   Years of education: Not on file   Highest education level: Not on file  Occupational History   Not on file  Tobacco Use   Smoking status: Never   Smokeless tobacco: Never  Vaping Use   Vaping Use: Never used  Substance and Sexual Activity   Alcohol use: No   Drug use: No   Sexual activity: Not on file  Other Topics Concern   Not on file  Social History Narrative   Lives with wife, son and daughter in law + 2 children   Right Handed   Drinks no caffeine   Social Determinants of Health   Financial Resource Strain: Not on file  Food Insecurity: Not on file  Transportation Needs: Not on file  Physical Activity: Not on file  Stress: Not on file  Social Connections: Not on file  Intimate Partner Violence: Not on file     Constitutional: Denies fever, malaise, fatigue,  headache or abrupt weight changes.  HEENT: Denies eye pain, eye redness, ear pain, ringing in the ears, wax buildup, runny nose, nasal congestion, bloody nose, or sore throat. Respiratory: Positive for intermittent cough. Denies difficulty breathing, shortness of breath, or sputum production.   Cardiovascular: Denies chest pain, chest tightness, palpitations or swelling in the hands or feet.  Gastrointestinal: Positive for intermittent bowel incontinence. Denies abdominal pain, bloating, constipation, diarrhea or blood in the stool.  GU: Positive for intermittent urinary incontinence. Denies urgency, frequency, pain with urination, burning sensation, blood in urine, odor or discharge. Musculoskeletal: Positive for difficulty with gait. Denies decrease in range of motion, muscle pain or joint pain and swelling.  Skin: Denies redness, rashes, lesions or ulcercations.  Neurological: Positive for difficulty with memory.  Denies dizziness, difficulty with speech or problems with balance and coordination.  Psych: Denies anxiety, depression, SI/HI.  No other specific complaints in a complete review of systems (except as listed in HPI above).     Objective:   Physical Exam  BP 106/60 (BP Location: Right Arm, Patient Position: Sitting, Cuff Size: Normal)   Pulse 84   Temp (!) 97.5 F (36.4 C) (Temporal)   Resp 17   Ht 5\' 9"  (1.753 m)   Wt 136 lb (61.7 kg)   SpO2 100%   BMI 20.08 kg/m   Wt Readings from Last 3 Encounters:  08/08/20 136 lb 14.4 oz (62.1 kg)  06/29/20 135 lb (61.2 kg)  06/02/20 136 lb 8 oz (61.9 kg)    General: Appears his stated age, well developed, well nourished in NAD. Skin: Warm, dry and intact.  HEENT: Head: normal shape and size; Eyes: EOMs intact;  Cardiovascular: Normal rate and rhythm. S1,S2 noted.  No murmur, rubs or gallops noted. No JVD or BLE edema. No carotid bruits noted. Pulmonary/Chest: Normal effort and positive vesicular breath sounds. No respiratory  distress. No wheezes, rales or ronchi noted.  Musculoskeletal: Shuffling gait. Walks with a cane. Neurological: Alert. Does not speak today. Psychiatric: Mood and affect flat.    BMET    Component Value Date/Time   NA 138 06/29/2020 1405   K 4.4 06/29/2020 1405   CL 102 06/29/2020 1405   CO2 29 06/29/2020 1405   GLUCOSE 108 (H) 06/29/2020 1405   BUN 13 06/29/2020 1405   CREATININE 0.73 06/29/2020 1405   CREATININE 0.85 06/12/2019 1529   CALCIUM 8.9 06/29/2020 1405   GFRNONAA >60 05/13/2020 0135   GFRNONAA 83 02/21/2015 1008   GFRAA >60 10/28/2019 0905   GFRAA >89 02/21/2015 1008    Lipid Panel     Component Value Date/Time   CHOL 129  10/29/2019 0444   CHOL 107 08/27/2019 1544   TRIG 76 05/09/2020 1232   HDL 45 10/29/2019 0444   HDL 35 (L) 08/27/2019 1544   CHOLHDL 2.9 10/29/2019 0444   VLDL 18 10/29/2019 0444   LDLCALC 66 10/29/2019 0444   LDLCALC 45 08/27/2019 1544   LDLCALC 45 06/12/2019 1529    CBC    Component Value Date/Time   WBC 7.7 06/02/2020 1309   RBC 4.32 06/02/2020 1309   HGB 11.7 (L) 06/02/2020 1309   HCT 35.7 (L) 06/02/2020 1309   PLT 308.0 06/02/2020 1309   MCV 82.6 06/02/2020 1309   MCH 26.9 05/13/2020 0135   MCHC 32.7 06/02/2020 1309   RDW 14.8 06/02/2020 1309   LYMPHSABS 1.5 06/02/2020 1309   MONOABS 0.8 06/02/2020 1309   EOSABS 0.2 06/02/2020 1309   BASOSABS 0.1 06/02/2020 1309    Hgb A1C Lab Results  Component Value Date   HGBA1C 5.3 10/29/2019            Assessment & Plan:    Webb Silversmith, NP This visit occurred during the SARS-CoV-2 public health emergency.  Safety protocols were in place, including screening questions prior to the visit, additional usage of staff PPE, and extensive cleaning of exam room while observing appropriate contact time as indicated for disinfecting solutions.

## 2021-01-23 NOTE — Assessment & Plan Note (Signed)
They declined CTA of the chest for monitoring at this time

## 2021-05-12 ENCOUNTER — Other Ambulatory Visit: Payer: Self-pay | Admitting: Internal Medicine

## 2021-05-12 NOTE — Telephone Encounter (Signed)
Requested medication (s) are due for refill today:   Yes  Requested medication (s) are on the active medication list:   Yes  Future visit scheduled:   No   Last ordered: 01/16/2021 80 g, 0 refills  Per new protocol this is a non delegated refill   Requested Prescriptions  Pending Prescriptions Disp Refills   triamcinolone cream (KENALOG) 0.1 % [Pharmacy Med Name: TRIAMCINOLONE 0.1% CREAM] 60 g     Sig: APPLY TO AFFECTED AREA TWICE A DAY AS NEEDED FOR [place]     Not Delegated - Dermatology:  Corticosteroids Failed - 05/12/2021  9:56 AM      Failed - This refill cannot be delegated      Passed - Valid encounter within last 12 months    Recent Outpatient Visits           3 months ago Aortic atherosclerosis [facility])   [facility] [place], Coralie Keens, NP

## 2021-06-22 ENCOUNTER — Telehealth: Payer: Medicare Other | Admitting: Physician Assistant

## 2021-06-22 DIAGNOSIS — J019 Acute sinusitis, unspecified: Secondary | ICD-10-CM

## 2021-06-22 DIAGNOSIS — B9689 Other specified bacterial agents as the cause of diseases classified elsewhere: Secondary | ICD-10-CM

## 2021-06-22 DIAGNOSIS — J40 Bronchitis, not specified as acute or chronic: Secondary | ICD-10-CM

## 2021-06-22 MED ORDER — ALBUTEROL SULFATE HFA 108 (90 BASE) MCG/ACT IN AERS: 2.0000 | INHALATION_SPRAY | Freq: Four times a day (QID) | RESPIRATORY_TRACT | 0 refills | Status: DC | PRN

## 2021-06-22 MED ORDER — PREDNISONE 20 MG PO TABS
40.0000 mg | ORAL_TABLET | Freq: Every day | ORAL | 0 refills | Status: DC
Start: 2021-06-22 — End: 2021-07-19

## 2021-06-22 MED ORDER — DOXYCYCLINE HYCLATE 100 MG PO TABS: 100.0000 mg | ORAL_TABLET | Freq: Two times a day (BID) | ORAL | 0 refills | Status: DC

## 2021-06-22 NOTE — Progress Notes (Signed)
E-Visit for Sinus Problems ? ?We are sorry that you are not feeling well.  Here is how we plan to help! ? ?Based on what you have shared with me it looks like you have sinusitis.  Sinusitis is inflammation and infection in the sinus cavities of the head.  Based on your presentation I believe you most likely have Acute Bacterial Sinusitis and bronchitis.  This is an infection caused by bacteria and is treated with antibiotics. I have prescribed Doxycycline '100mg'$  by mouth twice a day for 10 days. I will also prescribe Prednisone '40mg'$  x 5 days and an albuterol inhaler for shortness of breath. (I am unable to prescribe a nebulizer machine as this requires a face to face visit to be covered by insurance). You may use an oral decongestant such as Mucinex D or if you have glaucoma or high blood pressure use plain Mucinex. Saline nasal spray help and can safely be used as often as needed for congestion.  If you develop worsening sinus pain, fever or notice severe headache and vision changes, or if symptoms are not better after completion of antibiotic, please schedule an appointment with a health care provider.   ? ?Sinus infections are not as easily transmitted as other respiratory infection, however we still recommend that you avoid close contact with loved ones, especially the very young and elderly.  Remember to wash your hands thoroughly throughout the day as this is the number one way to prevent the spread of infection! ? ?Home Care: ?Only take medications as instructed by your medical team. ?Complete the entire course of an antibiotic. ?Do not take these medications with alcohol. ?A steam or ultrasonic humidifier can help congestion.  You can place a towel over your head and breathe in the steam from hot water coming from a faucet. ?Avoid close contacts especially the very young and the elderly. ?Cover your mouth when you cough or sneeze. ?Always remember to wash your hands. ? ?Get Help Right Away If: ?You develop  worsening fever or sinus pain. ?You develop a severe head ache or visual changes. ?Your symptoms persist after you have completed your treatment plan. ? ?Make sure you ?Understand these instructions. ?Will watch your condition. ?Will get help right away if you are not doing well or get worse. ? ?Thank you for choosing an e-visit. ? ?Your e-visit answers were reviewed by a board certified advanced clinical practitioner to complete your personal care plan. Depending upon the condition, your plan could have included both over the counter or prescription medications. ? ?Please review your pharmacy choice. Make sure the pharmacy is open so you can pick up prescription now. If there is a problem, you may contact your provider through [employer] and have the prescription routed to another pharmacy.  Your safety is important to [place]. If you have drug allergies check your prescription carefully.  ? ?For the next 24 hours you can use MyChart to ask questions about today's visit, request a non-urgent call back, or ask for a work or school excuse. ?You will get an email in the next two days asking about your experience. I hope that your e-visit has been valuable and will speed your recovery. ? ?I provided 5 minutes of non face-to-face time during this encounter for chart review and documentation.  ? ?

## 2021-07-05 ENCOUNTER — Other Ambulatory Visit: Payer: Self-pay | Admitting: Neurology

## 2021-07-19 ENCOUNTER — Ambulatory Visit (INDEPENDENT_AMBULATORY_CARE_PROVIDER_SITE_OTHER): Payer: Medicare Other | Admitting: Internal Medicine

## 2021-07-19 ENCOUNTER — Encounter: Payer: Self-pay | Admitting: Internal Medicine

## 2021-07-19 VITALS — BP 115/68 | HR 64 | Temp 96.9°F | Wt 131.0 lb

## 2021-07-19 DIAGNOSIS — D508 Other iron deficiency anemias: Secondary | ICD-10-CM

## 2021-07-19 DIAGNOSIS — I700 Atherosclerosis of aorta: Secondary | ICD-10-CM

## 2021-07-19 DIAGNOSIS — R531 Weakness: Secondary | ICD-10-CM

## 2021-07-19 DIAGNOSIS — G459 Transient cerebral ischemic attack, unspecified: Secondary | ICD-10-CM

## 2021-07-19 DIAGNOSIS — R053 Chronic cough: Secondary | ICD-10-CM

## 2021-07-19 DIAGNOSIS — I7121 Aneurysm of the ascending aorta, without rupture: Secondary | ICD-10-CM

## 2021-07-19 DIAGNOSIS — F01B0 Vascular dementia, moderate, without behavioral disturbance, psychotic disturbance, mood disturbance, and anxiety: Secondary | ICD-10-CM

## 2021-07-19 DIAGNOSIS — K219 Gastro-esophageal reflux disease without esophagitis: Secondary | ICD-10-CM

## 2021-07-19 MED ORDER — PANTOPRAZOLE SODIUM 40 MG PO TBEC: 40.0000 mg | DELAYED_RELEASE_TABLET | Freq: Every day | ORAL | 1 refills | Status: DC

## 2021-07-19 NOTE — Assessment & Plan Note (Signed)
Mild cognitive and functional needs ?Continue Donepezil and Memantine ?He will continue to follow with neurology ?

## 2021-07-19 NOTE — Patient Instructions (Signed)
Heart-Healthy Eating Plan Heart-healthy meal planning includes: Eating less unhealthy fats. Eating more healthy fats. Making other changes in your diet. Talk with your doctor or a diet specialist (dietitian) to create an eating plan that is right for you. What is my plan? Your doctor may recommend an eating plan that includes: Total fat: ______% or less of total calories a day. Saturated fat: ______% or less of total calories a day. Cholesterol: less than _________mg a day. What are tips for following this plan? Cooking Avoid frying your food. Try to bake, boil, grill, or broil it instead. You can also reduce fat by: Removing the skin from poultry. Removing all visible fats from meats. Steaming vegetables in water or broth. Meal planning  At meals, divide your plate into four equal parts: Fill one-half of your plate with vegetables and green salads. Fill one-fourth of your plate with whole grains. Fill one-fourth of your plate with lean protein foods. Eat 4-5 servings of vegetables per day. A serving of vegetables is: 1 cup of raw or cooked vegetables. 2 cups of raw leafy greens. Eat 4-5 servings of fruit per day. A serving of fruit is: 1 medium whole fruit.  cup of dried fruit.  cup of fresh, frozen, or canned fruit.  cup of 100% fruit juice. Eat more foods that have soluble fiber. These are apples, broccoli, carrots, beans, peas, and barley. Try to get 20-30 g of fiber per day. Eat 4-5 servings of nuts, legumes, and seeds per week: 1 serving of dried beans or legumes equals  cup after being cooked. 1 serving of nuts is  cup. 1 serving of seeds equals 1 tablespoon. General information Eat more home-cooked food. Eat less restaurant, buffet, and fast food. Limit or avoid alcohol. Limit foods that are high in starch and sugar. Avoid fried foods. Lose weight if you are overweight. Keep track of how much salt (sodium) you eat. This is important if you have high blood  pressure. Ask your doctor to tell you more about this. Try to add vegetarian meals each week. Fats Choose healthy fats. These include olive oil and canola oil, flaxseeds, walnuts, almonds, and seeds. Eat more omega-3 fats. These include salmon, mackerel, sardines, tuna, flaxseed oil, and ground flaxseeds. Try to eat fish at least 2 times each week. Check food labels. Avoid foods with trans fats or high amounts of saturated fat. Limit saturated fats. These are often found in animal products, such as meats, butter, and cream. These are also found in plant foods, such as palm oil, palm kernel oil, and coconut oil. Avoid foods with partially hydrogenated oils in them. These have trans fats. Examples are stick margarine, some tub margarines, cookies, crackers, and other baked goods. What foods can I eat? Fruits All fresh, canned (in natural juice), or frozen fruits. Vegetables Fresh or frozen vegetables (raw, steamed, roasted, or grilled). Green salads. Grains Most grains. Choose whole wheat and whole grains most of the time. Rice and pasta, including brown rice and pastas made with whole wheat. Meats and other proteins Lean, well-trimmed beef, veal, pork, and lamb. Chicken and turkey without skin. All fish and shellfish. Wild duck, rabbit, pheasant, and venison. Egg whites or low-cholesterol egg substitutes. Dried beans, peas, lentils, and tofu. Seeds and most nuts. Dairy Low-fat or nonfat cheeses, including ricotta and mozzarella. Skim or 1% milk that is liquid, powdered, or evaporated. Buttermilk that is made with low-fat milk. Nonfat or low-fat yogurt. Fats and oils Non-hydrogenated (trans-free) margarines. Vegetable oils, including   soybean, sesame, sunflower, olive, peanut, safflower, corn, canola, and cottonseed. Salad dressings or mayonnaise made with a vegetable oil. Beverages Mineral water. Coffee and tea. Diet carbonated beverages. Sweets and desserts Sherbet, gelatin, and fruit ice.  Small amounts of dark chocolate. Limit all sweets and desserts. Seasonings and condiments All seasonings and condiments. The items listed above may not be a complete list of foods and drinks you can eat. Contact a dietitian for more options. What foods should I avoid? Fruits Canned fruit in heavy syrup. Fruit in cream or butter sauce. Fried fruit. Limit coconut. Vegetables Vegetables cooked in cheese, cream, or butter sauce. Fried vegetables. Grains Breads that are made with saturated or trans fats, oils, or whole milk. Croissants. Sweet rolls. Donuts. High-fat crackers, such as cheese crackers. Meats and other proteins Fatty meats, such as hot dogs, ribs, sausage, bacon, rib-eye roast or steak. High-fat deli meats, such as salami and bologna. Caviar. Domestic duck and goose. Organ meats, such as liver. Dairy Cream, sour cream, cream cheese, and creamed cottage cheese. Whole-milk cheeses. Whole or 2% milk that is liquid, evaporated, or condensed. Whole buttermilk. Cream sauce or high-fat cheese sauce. Yogurt that is made from whole milk. Fats and oils Meat fat, or shortening. Cocoa butter, hydrogenated oils, palm oil, coconut oil, palm kernel oil. Solid fats and shortenings, including bacon fat, salt pork, lard, and butter. Nondairy cream substitutes. Salad dressings with cheese or sour cream. Beverages Regular sodas and juice drinks with added sugar. Sweets and desserts Frosting. Pudding. Cookies. Cakes. Pies. Milk chocolate or white chocolate. Buttered syrups. Full-fat ice cream or ice cream drinks. The items listed above may not be a complete list of foods and drinks to avoid. Contact a dietitian for more information. Summary Heart-healthy meal planning includes eating less unhealthy fats, eating more healthy fats, and making other changes in your diet. Eat a balanced diet. This includes fruits and vegetables, low-fat or nonfat dairy, lean protein, nuts and legumes, whole grains, and  heart-healthy oils and fats. This information is not intended to replace advice given to you by your health care provider. Make sure you discuss any questions you have with your health care provider. Document Revised: 08/04/2020 Document Reviewed: 08/04/2020 Elsevier Patient Education  2022 [employer] Inc[employer]  

## 2021-07-19 NOTE — Assessment & Plan Note (Signed)
Encouraged him to consume a low-fat diet ?Continue Rosuvastatin and Plavix ?

## 2021-07-19 NOTE — Assessment & Plan Note (Signed)
We will check CBC with annual exam ?

## 2021-07-19 NOTE — Assessment & Plan Note (Signed)
Continue Albuterol.

## 2021-07-19 NOTE — Progress Notes (Signed)
? ?Subjective:  ? ? Patient ID: Christopher Murphy, male    DOB: 08/28/1945, 76 y.o.   MRN: [id number] ? ?HPI ? ?Patient presents to clinic today for follow-up of chronic conditions. ? ?HLD with Aortic Atherosclerosis, History of TIA: Mainly cognitive.  His last LDL was 75, triglycerides 131, 01/2021.  He is taking Rosuvastatin and Plavix as prescribed.  He follows with neurology. ? ?Thoracic Aortic Aneurysm: CT of the chest from 04/2020 reviewed.  He does not follow with vascular surgery. ? 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  He needs assistance with ADLs.  He can feed himself.  He is occasionally incontinent of bowel and bladder.  He is taking Donepezil and Memantine as prescribed.  He follows with neurology. ? ?Chronic Cough: Intermittent.  He uses Albuterol as needed.  There are no PFTs on file. ? ?Anemia: His last H/H was 14, 44, 01/2021.  He is not taking any oral iron at this time.  He does not follow with hematology. ? ?GERD: Nausea and vomiting after meals. He denies abdominal pain. He has tried Famotidine and Prilosec OTC with minimal relief of symptoms.  ? ?He would like a handicap placard today. ? ?Review of Systems ? ?   ?Past Medical History:  ?Diagnosis Date  ? Blood clots in brain 2009  ? Stroke [facility])   ? ? ?Current Outpatient Medications  ?Medication Sig Dispense Refill  ? albuterol (VENTOLIN HFA) 108 (90 Base) MCG/ACT inhaler Inhale 2 puffs into the lungs every 6 (six) hours as needed for wheezing or shortness of breath. 8 g 0  ? camphor-menthol (SARNA) lotion Apply 1 application topically as needed for itching. 222 mL 2  ? cetirizine (ZYRTEC) 10 MG tablet Take 1 tablet (10 mg total) by mouth daily. (Patient taking differently: Take 10 mg by mouth daily as needed for allergies or rhinitis.) 30 tablet 11  ? clopidogrel (PLAVIX) 75 MG tablet Take 1 tablet (75 mg total) by mouth daily. 90 tablet 1  ? donepezil (ARICEPT) 10 MG tablet Take 1 tablet (10 mg total) by mouth at bedtime. 90 tablet 1  ?  doxycycline (VIBRA-TABS) 100 MG tablet Take 1 tablet (100 mg total) by mouth 2 (two) times daily. 20 tablet 0  ? memantine (NAMENDA) 10 MG tablet TAKE 1 TABLET BY MOUTH TWICE A DAY 180 tablet 4  ? Polyethyl Glycol-Propyl Glycol (SYSTANE) 0.4-0.3 % SOLN Apply 1 drop to eye 2 (two) times daily. (Patient taking differently: Place 1 drop into both eyes 2 (two) times daily.) 30 mL 5  ? polyethylene glycol (MIRALAX / GLYCOLAX) 17 g packet Take 17 g by mouth daily. 14 each 0  ? predniSONE (DELTASONE) 20 MG tablet Take 2 tablets (40 mg total) by mouth daily with breakfast. 10 tablet 0  ? rosuvastatin (CRESTOR) 20 MG tablet Take 1 tablet (20 mg total) by mouth daily. 90 tablet 1  ? triamcinolone cream (KENALOG) 0.1 % APPLY TO AFFECTED AREA TWICE A DAY AS NEEDED FOR ITCHING 60 g 0  ? ?No current facility-administered medications for this visit.  ? ? ?No Known Allergies ? ?Family History  ?Problem Relation Age of Onset  ? AAA (abdominal aortic aneurysm) Neg Hx   ? ? ?Social History  ? ?Socioeconomic History  ? Marital status: Married  ?  Spouse name: Not on file  ? Number of children: Not on file  ? Years of education: Not on file  ? Highest education level: Not on file  ?Occupational History  ? Not  on file  ?Tobacco Use  ? Smoking status: Never  ? Smokeless tobacco: Never  ?Vaping Use  ? Vaping Use: Never used  ?Substance and Sexual Activity  ? Alcohol use: No  ? Drug use: No  ? Sexual activity: Not on file  ?Other Topics Concern  ? Not on file  ?Social History Narrative  ? Lives with wife, son and daughter in law + 2 children  ? Right Handed  ? Drinks no caffeine  ? ?Social Determinants of Health  ? ?Financial Resource Strain: Not on file  ?Food Insecurity: Not on file  ?Transportation Needs: Not on file  ?Physical Activity: Not on file  ?Stress: Not on file  ?Social Connections: Not on file  ?Intimate Partner Violence: Not on file  ? ? ? ?Constitutional: Denies fever, malaise, fatigue, headache or abrupt weight changes.   ?HEENT: Denies eye pain, eye redness, ear pain, ringing in the ears, wax buildup, runny nose, nasal congestion, bloody nose, or sore throat. ?Respiratory: Patient reports chronic cough.  Denies difficulty breathing, shortness of breath, or sputum production.   ?Cardiovascular: Denies chest pain, chest tightness, palpitations or swelling in the hands or feet.  ?Gastrointestinal: Patient reports intermittent bowel incontinence.  Denies abdominal pain, bloating, constipation, diarrhea or blood in the stool.  ?GU: Patient reports intermittent urine incontinence.  Denies urgency, frequency, pain with urination, burning sensation, blood in urine, odor or discharge. ?Musculoskeletal: Denies decrease in range of motion, difficulty with gait, muscle pain or joint pain and swelling.  ?Skin: Denies redness, rashes, lesions or ulcercations.  ?Neurological: Patient reports difficulty with memory, problems with balance and coordination.  Denies difficulty with speech.  ?Psych: Denies anxiety, depression, SI/HI. ? ?No other specific complaints in a complete review of systems (except as listed in HPI above). ? ?Objective:  ? Physical Exam ? ?BP 115/68 (BP Location: Left Arm, Patient Position: Sitting, Cuff Size: Normal)   Pulse 64   Temp (!) 96.9 ?F (36.1 ?C) (Temporal)   Wt 131 lb (59.4 kg)   SpO2 100%   BMI 19.35 kg/m?  ? ?Wt Readings from Last 3 Encounters:  ?01/23/21 136 lb (61.7 kg)  ?08/08/20 136 lb 14.4 oz (62.1 kg)  ?06/29/20 135 lb (61.2 kg)  ? ? ?General: Appears his stated age, well developed, well nourished in NAD. ?Skin: Warm, dry and intact.  ?HEENT: Head: normal shape and size; Eyes: EOMs intact;  ?Cardiovascular: Normal rate and rhythm. S1,S2 noted.  No murmur, rubs or gallops noted. No JVD or BLE edema. No carotid bruits noted. ?Pulmonary/Chest: Normal effort and positive vesicular breath sounds. No respiratory distress. No wheezes, rales or ronchi noted.  ?Abd: Soft, nontender. Active bowel  sounds. ?Musculoskeletal: Gait slow and steady with use of cane. ?Neurological: Alert and oriented. ? ? ?BMET ?   ?Component Value Date/Time  ? NA 139 01/23/2021 1042  ? K 4.5 01/23/2021 1042  ? CL 102 01/23/2021 1042  ? CO2 30 01/23/2021 1042  ? GLUCOSE 103 (H) 01/23/2021 1042  ? BUN 10 01/23/2021 1042  ? CREATININE 0.89 01/23/2021 1042  ? CALCIUM 9.4 01/23/2021 1042  ? GFRNONAA >60 05/13/2020 0135  ? GFRNONAA 83 02/21/2015 1008  ? GFRAA >60 10/28/2019 0905  ? GFRAA >89 02/21/2015 1008  ? ? ?Lipid Panel  ?   ?Component Value Date/Time  ? CHOL 147 01/23/2021 1042  ? CHOL 107 08/27/2019 1544  ? TRIG 131 01/23/2021 1042  ? HDL 51 01/23/2021 1042  ? HDL 35 (L) 08/27/2019 1544  ?  CHOLHDL 2.9 01/23/2021 1042  ? VLDL 18 10/29/2019 0444  ? [place] 75 01/23/2021 1042  ? ? ?CBC ?   ?Component Value Date/Time  ? WBC 6.8 01/23/2021 1042  ? RBC 5.29 01/23/2021 1042  ? HGB 14.0 01/23/2021 1042  ? HCT 44.0 01/23/2021 1042  ? PLT 249 01/23/2021 1042  ? MCV 83.2 01/23/2021 1042  ? MCH 26.5 (L) 01/23/2021 1042  ? MCHC 31.8 (L) 01/23/2021 1042  ? RDW 13.1 01/23/2021 1042  ? LYMPHSABS 1.5 06/02/2020 1309  ? MONOABS 0.8 06/02/2020 1309  ? EOSABS 0.2 06/02/2020 1309  ? BASOSABS 0.1 06/02/2020 1309  ? ? ?Hgb A1C ?Lab Results  ?Component Value Date  ? HGBA1C 5.3 10/29/2019  ? ? ? ? ? ? ?   ?Assessment & Plan:  ? ? ?Webb Silversmith, NP ? ?

## 2021-07-19 NOTE — Assessment & Plan Note (Signed)
Will obtain CT chest for further evaluation of aneurysm ?

## 2021-07-19 NOTE — Assessment & Plan Note (Signed)
Continue Rosuvastatin and Plavix ?He will continue to follow with neurology ?

## 2021-07-19 NOTE — Assessment & Plan Note (Signed)
We will trial Pantoprazole 40 mg daily ?

## 2021-08-09 ENCOUNTER — Ambulatory Visit (INDEPENDENT_AMBULATORY_CARE_PROVIDER_SITE_OTHER): Payer: Medicare Other | Admitting: Neurology

## 2021-08-09 ENCOUNTER — Encounter: Payer: Self-pay | Admitting: Neurology

## 2021-08-09 ENCOUNTER — Other Ambulatory Visit: Payer: Self-pay | Admitting: Internal Medicine

## 2021-08-09 VITALS — BP 126/74 | HR 76 | Ht 69.0 in | Wt 136.5 lb

## 2021-08-09 DIAGNOSIS — F01B0 Vascular dementia, moderate, without behavioral disturbance, psychotic disturbance, mood disturbance, and anxiety: Secondary | ICD-10-CM | POA: Diagnosis not present

## 2021-08-09 DIAGNOSIS — R4789 Other speech disturbances: Secondary | ICD-10-CM | POA: Diagnosis not present

## 2021-08-09 DIAGNOSIS — Z8673 Personal history of transient ischemic attack (TIA), and cerebral infarction without residual deficits: Secondary | ICD-10-CM

## 2021-08-09 MED ORDER — ASPIRIN EC 81 MG PO TBEC: 81.0000 mg | DELAYED_RELEASE_TABLET | Freq: Every day | ORAL | 2 refills | Status: DC

## 2021-08-09 NOTE — Progress Notes (Signed)
?[facility] ?[address] ?[place]. [place] [address] ?[phone] ? ?     OFFICE FOLLOW UP VISIT NOTE ? ?Christopher Murphy ?Date of Birth:  October 29, 1945 ?Medical Record Number:  [id number]  ? ?Referring MD: Webb Silversmith, NP ?Reason for Referral: TIA ?HPI: Initial visit 03/11/2018: Mr Murphy is a pleasant 76 year old [place] Sikh male who seen today for initial office consultation visit.  He is accompanied by his son and daughter-in-law who provide most of the history.  I have reviewed imaging films in PACS.  The patient has had recurrent transient episodes of speech difficulties for the last 10 years.  The episodes are stereotypical.  The patient struggles to speak at times and get words out.  At times his speech is gibberish and nonsensical.  These typically last only few minutes around 5-10 or so but he has had a couple episodes which lasted for an hour or longer.  During these prolonged episodes he is been found to be confused and disoriented.  Interestingly patient had a similar episode on 04/05/2016 when he was admitted to [facility].  He had MRI scan of the brain at that time which I personally reviewed and shows interesting diffuse hyperintensity throughout the cerebral white matter raising concern for seizures.  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  EEG was apparently not done.  Patient was not started on seizure medications.  Patient has history of left posterior cerebral artery infarct in 2009 in [place].  He was placed on a combination tablet of aspirin and Plavix as well as medication for lipids.  He had cognitive impairment and mild memory loss and was started on Namenda as well.  He is presently on Namenda 5 mg twice daily.  Family states that he is cognitively stable and does have short-term memory difficulties but long-term memory is good.  He is living  at home with family.  He needs only mild help with activities of daily living.  He can ambulate independently.  He has not had any witnessed generalized tonic-clonic seizure significant head injury or loss of consciousness.  The patient actually had gone to [place] following this episode in December 2017 for nearly 3 years before he came back.  He was seen by neurologist there but details are not known.  He was never placed on seizure medications. ?Update 08/27/2019 : He returns for follow-up after last visit a year and half ago.  Is accompanied by his son.  The patient continues to have mild memory and cognitive difficulties which appear to be unchanged and not particularly progressive.  He remains on Namenda 5 mg twice daily which is tolerating well.  He has never tried a higher dose.  Patient is not had many episodes of transient speech difficulties since her last visit.  He did undergo MRI scan of the brain on 03/31/2018 which showed old left PCA as well as few remote age lacunar infarcts and changes of small vessel disease.  No acute abnormalities.  MRA of the brain and neck both did not show any large vessel stenosis or occlusion.  EEG on 03/25/2018 was normal.  Patient had a colonoscopy done on 04/30/2019 and had a 3 mm polyp excised.  He has been having some swallowing difficulties of late and saw gastroenterologist in [place].  Modified barium swallow was done by speech therapy there but I do not have the report but the  patient's son was informed that it was fine.  He has no new complaints. ?Update 11/17/2019 : He returns for follow-up after last visit 3 months ago.  Is accompanied by his daughter-in-law.  They state that he had a episode of increasing confusion and hallucination as well as delusion lasting 3 days following a bladder infection and treatment with antibiotics.  He was admitted to the hospital and UA showed mild microscopic hematuria and he had recent UTI treatment.  Chest x-ray was  unremarkable.  Chest CT was also done which showed no significant abnormalities.  MRI scan of the brain showing progressive cerebral atrophy and old left PCA infarct but no acute abnormality.  2D echo showed normal ejection fraction.  Carotid ultrasound showed no significant extracranial stenosis.  His confusion improved over 3 days and he has done very well for the last week or so now and is at home.  Is back to his baseline.  He has a renal ultrasound pending for his hematuria.  He remains on aspirin and Plavix which is tolerating well without any external bleeding or bruising.  Mini-Mental status exam today scored 18/30 with deficits in orientation and recall.  He was able to copy intersecting pentagons but difficulty naming animals.  He has not been tried on Aricept so far.  He has not had recurrent TIA or stroke symptoms.  He has been taking Namenda 10 mg twice daily now for several months but family has not noticed any benefit.  Patient is currently not having any delusions, hallucinations, agitation or unsafe behavior.  He can walk independently but his gait has become more stooped but he has not had falls or injuries. ?Update 02/08/2020: He returns for follow-up after last visit to enough months ago.  Is accompanied by his daughter-in-law.  Patient has not had any further episodes of confusion hallucinations or delusions.  He is tolerating Aricept 10 mg daily well but feels appetite may have gone down.  He is however not lost weight.  He is cognitively unchanged and there has been no specific improvement noted.  Is tolerating both Namenda and Aricept fairly well.  Has had no recurrent stroke or TIA symptoms.  He remains on aspirin and Crestor both of which is tolerating well.  Blood pressures well controlled today it is 120/67.  He has no new complaints. ?Update 08/08/2020 : He returns for follow-up after last visit 6 months ago.  He is accompanied by his daughter-in-law.  Patient continues to have cognitive  impairment which appears to have worsened slightly after recent COVID infection in January 2022.Marland Kitchen  Patient had some aspiration pneumonia during that hospitalization as well.  He was quite confused and disoriented but gradually seems to have returned almost back to his baseline.  He still needs constant supervision at home.  He is lost some of independence in activities like dressing himself and cleaning himself which he was previously able to do by himself.  He can still walk independently.  He had some trouble swallowing and speech therapy has recommended that he eat soft food only.  He has had no recurrent stroke or TIA.  He remains on aspirin which is tolerating well without side effects.  He is also tolerating Aricept and Namenda without side effects.  Blood pressure is well controlled and today it is 118/76. ?Update 08/09/2021 : She returns for follow-up after last visit a year ago.  He is accompanied by his daughter-in-law.  He continues to have significant cognitive impairment and memory difficulties.  He has good days and bad days.  There are times when he cannot recognize family members and forgets the names.  He had a trip to [place] from November last year to January this year.  He can cannot recall any details of the trip.  He continues to have occasional episodes of intermittent babbling speech and speech difficulties with the last episode being 2 months ago.  This lasted few hours and he went to sleep and was fine next day morning when he woke up.  He was diagnosed with aortic root dilatation with family obviously wants conservative follow-up.  He remains on aspirin Perin and Plavix and tolerating well without bruising or bleeding.  He has had no recurrent stroke or TIA symptoms.  Continues to have right-sided peripheral visual defect as well as some dragging of his right leg while walking.  His blood pressure under good control and today it is 126/74.  He is tolerating Crestor well without muscle aches  and pains.  He remains on Aricept and Namenda and is tolerating both medications well without side effects.  ? ROS:   ?14 system review of systems is positive for speech difficulties, memory loss, confusion, decreas

## 2021-08-09 NOTE — Telephone Encounter (Signed)
Requested medication (s) are due for refill today -no ? ?Requested medication (s) are on the active medication list -no ? ?Future visit scheduled -no ? ?Last refill: no longer on current medication list ? ?Notes to clinic: Request RF: medication discontinued 01/23/21- no longer current ? ?Requested Prescriptions  ?Pending Prescriptions Disp Refills  ? ASPIRIN LOW DOSE 81 MG EC tablet [Pharmacy Med Name: ASPIRIN EC 81 MG TABLET] 90 tablet 3  ?  Sig: TAKE 1 TABLET BY MOUTH EVERY DAY  ?  ? Analgesics:  NSAIDS - aspirin Passed - 08/09/2021 11:56 AM  ?  ?  Passed - Cr in normal range and within 360 days  ?  Creat  ?Date Value Ref Range Status  ?01/23/2021 0.89 0.70 - 1.28 mg/dL Final  ?  ?  ?  ?  Passed - eGFR is 10 or above and within 360 days  ?  GFR, Est African American  ?Date Value Ref Range Status  ?02/21/2015 >89 >=60 mL/min Final  ? ?GFR calc Af Amer  ?Date Value Ref Range Status  ?10/28/2019 >60 >60 mL/min Final  ? ?GFR, Est Non African American  ?Date Value Ref Range Status  ?02/21/2015 83 >=60 mL/min Final  ?  Comment:  ?    ?The estimated GFR is a calculation valid for adults (>=51 years old) ?that uses the CKD-EPI algorithm to adjust for age and sex. It is   ?not to be used for children, pregnant women, hospitalized patients,    ?patients on dialysis, or with rapidly changing kidney function. ?According to the NKDEP, eGFR >89 is normal, 60-89 shows mild ?impairment, 30-59 shows moderate impairment, 15-29 shows severe ?impairment and <15 is ESRD. ?  ?  ? ?GFR, Estimated  ?Date Value Ref Range Status  ?05/13/2020 >60 >60 mL/min Final  ?  Comment:  ?  (NOTE) ?Calculated using the CKD-EPI Creatinine Equation (2021) ?  ? ?GFR  ?Date Value Ref Range Status  ?06/29/2020 89.57 >60.00 mL/min Final  ?  Comment:  ?  Calculated using the CKD-EPI Creatinine Equation (2021)  ? ?eGFR  ?Date Value Ref Range Status  ?01/23/2021 89 > OR = 60 mL/min/1.18m Final  ?  Comment:  ?  The eGFR is based on the CKD-EPI 2021 equation. To  calculate  ?the new eGFR from a previous Creatinine or Cystatin C ?result, go to https://www.kidney.org/professionals/ ?kdoqi/gfr%5Fcalculator ?  ?  ?  ?  ?  Passed - Patient is not pregnant  ?  ?  Passed - Valid encounter within last 12 months  ?  Recent Outpatient Visits   ? ?      ? 3 weeks [facility]  ? 6 months [facility] atherosclerosis [facility]  ? [facility]  ? ?  ?  ? ? ?  ?  ?  ? ? ? ?Requested Prescriptions  ?Pending Prescriptions Disp Refills  ? ASPIRIN LOW DOSE 81 MG EC tablet [Pharmacy Med Name: ASPIRIN EC 81 MG TABLET] 90 tablet 3  ?  Sig: TAKE 1 TABLET BY MOUTH EVERY DAY  ?  ? Analgesics:  NSAIDS - aspirin Passed - 08/09/2021 11:56 AM  ?  ?  Passed - Cr in normal range and within 360 days  ?  Creat  ?Date Value Ref Range Status  ?01/23/2021 0.89 0.70 - 1.28 mg/dL Final  ?  ?  ?  ?  Passed - eGFR is  10 or above and within 360 days  ?  GFR, Est African American  ?Date Value Ref Range Status  ?02/21/2015 >89 >=60 mL/min Final  ? ?GFR calc Af Amer  ?Date Value Ref Range Status  ?10/28/2019 >60 >60 mL/min Final  ? ?GFR, Est Non African American  ?Date Value Ref Range Status  ?02/21/2015 83 >=60 mL/min Final  ?  Comment:  ?    ?The estimated GFR is a calculation valid for adults (>=36 years old) ?that uses the CKD-EPI algorithm to adjust for age and sex. It is   ?not to be used for children, pregnant women, hospitalized patients,    ?patients on dialysis, or with rapidly changing kidney function. ?According to the NKDEP, eGFR >89 is normal, 60-89 shows mild ?impairment, 30-59 shows moderate impairment, 15-29 shows severe ?impairment and <15 is ESRD. ?  ?  ? ?GFR, Estimated  ?Date Value Ref Range Status  ?05/13/2020 >60 >60 mL/min Final  ?  Comment:  ?  (NOTE) ?Calculated using the CKD-EPI Creatinine Equation (2021) ?  ? ?GFR  ?Date Value Ref Range Status  ?06/29/2020 89.57 >60.00 mL/min Final  ?  Comment:  ?   Calculated using the CKD-EPI Creatinine Equation (2021)  ? ?eGFR  ?Date Value Ref Range Status  ?01/23/2021 89 > OR = 60 mL/min/1.43m Final  ?  Comment:  ?  The eGFR is based on the CKD-EPI 2021 equation. To calculate  ?the new eGFR from a previous Creatinine or Cystatin C ?result, go to https://www.kidney.org/professionals/ ?kdoqi/gfr%5Fcalculator ?  ?  ?  ?  ?  Passed - Patient is not pregnant  ?  ?  Passed - Valid encounter within last 12 months  ?  Recent Outpatient Visits   ? ?      ? 3 weeks ago [facility]  [facility]  ? 6 months ago [facility]ortic atherosclerosis ([facility]  ? [facility]  ? ?  ?  ? ? ?  ?  ?  ? ? ? ?

## 2021-08-09 NOTE — Patient Instructions (Signed)
I had a long discussion the patient and his daughter-in-law regarding his mild vascular dementia and remote stroke and recommend he continue Aricept 10 mg daily as well as Namenda 10 mg twice daily.  I also advised him to continue participation in cognitively challenging activities like solving crossword puzzles, playing bridge and sodoku.  He was advised to continue aspirin alone and discontinue Plavix for stroke prevention and maintain aggressive risk factor modification with strict control of hypertension with blood pressure goal below 130/90 and lipids with LDL cholesterol goal below 70 mg percent.  Check EEG as well as screening carotid ultrasound study.  He will return for follow-up in the future in 1 year or call earlier if necessary. ?

## 2021-08-14 ENCOUNTER — Telehealth: Payer: Self-pay | Admitting: Neurology

## 2021-08-14 NOTE — Telephone Encounter (Signed)
Medicare/medicaid no Christopher Murphy, sent a message to [place] with [employer] she will reach out to the patient to schedule.  ?

## 2021-08-16 ENCOUNTER — Other Ambulatory Visit: Payer: Medicare Other | Admitting: *Deleted

## 2021-09-06 ENCOUNTER — Ambulatory Visit: Payer: Medicare Other | Admitting: Neurology

## 2021-09-06 DIAGNOSIS — F01B0 Vascular dementia, moderate, without behavioral disturbance, psychotic disturbance, mood disturbance, and anxiety: Secondary | ICD-10-CM

## 2021-09-06 DIAGNOSIS — R4182 Altered mental status, unspecified: Secondary | ICD-10-CM | POA: Diagnosis not present

## 2021-09-06 DIAGNOSIS — R4789 Other speech disturbances: Secondary | ICD-10-CM

## 2021-09-14 NOTE — Progress Notes (Signed)
Kindly inform the patient that EEG study showed mild slowing of brain activity which may occur in patients with memory loss from previous stroke.  No seizure activity was noted.  Nothing to worry about

## 2021-09-15 ENCOUNTER — Encounter: Payer: Self-pay | Admitting: *Deleted

## 2021-10-02 ENCOUNTER — Encounter: Payer: Self-pay | Admitting: Internal Medicine

## 2021-10-02 DIAGNOSIS — R269 Unspecified abnormalities of gait and mobility: Secondary | ICD-10-CM

## 2021-10-05 ENCOUNTER — Ambulatory Visit (INDEPENDENT_AMBULATORY_CARE_PROVIDER_SITE_OTHER): Payer: Medicare Other

## 2021-10-05 DIAGNOSIS — Z8673 Personal history of transient ischemic attack (TIA), and cerebral infarction without residual deficits: Secondary | ICD-10-CM

## 2021-10-11 ENCOUNTER — Encounter: Payer: Self-pay | Admitting: *Deleted

## 2021-10-11 NOTE — Progress Notes (Signed)
Kindly inform the patient that carotid ultrasound study does not show significant blockages in either carotid bifurcation.

## 2021-10-20 ENCOUNTER — Encounter: Payer: Self-pay | Admitting: Internal Medicine

## 2021-10-20 DIAGNOSIS — Z8673 Personal history of transient ischemic attack (TIA), and cerebral infarction without residual deficits: Secondary | ICD-10-CM

## 2021-10-20 DIAGNOSIS — R269 Unspecified abnormalities of gait and mobility: Secondary | ICD-10-CM

## 2021-10-24 NOTE — Telephone Encounter (Signed)
Orders have been faxed to the number provided.  Thanks,   -Mickel Baas

## 2021-11-11 ENCOUNTER — Other Ambulatory Visit: Payer: Self-pay | Admitting: Neurology

## 2021-11-11 ENCOUNTER — Other Ambulatory Visit: Payer: Self-pay | Admitting: Internal Medicine

## 2021-11-13 NOTE — Telephone Encounter (Signed)
Requested medication (s) are due for refill today: no  Requested medication (s) are on the active medication list:no  Last refill:  08/09/21, discontinued  Future visit scheduled: no  Notes to clinic:  Unable to refill per protocol, Rx expired. Medication was discontinued 08/09/21.     Requested Prescriptions  Pending Prescriptions Disp Refills   clopidogrel (PLAVIX) 75 MG tablet [Pharmacy Med Name: CLOPIDOGREL 75 MG TABLET] 90 tablet 1    Sig: TAKE 1 TABLET BY MOUTH EVERY DAY     Hematology: Antiplatelets - clopidogrel Failed - 11/11/2021  1:21 AM      Failed - HCT in normal range and within 180 days    HCT  Date Value Ref Range Status  01/23/2021 44.0 38.5 - 50.0 % Final         Failed - HGB in normal range and within 180 days    Hemoglobin  Date Value Ref Range Status  01/23/2021 14.0 13.2 - 17.1 g/dL Final         Failed - PLT in normal range and within 180 days    Platelets  Date Value Ref Range Status  01/23/2021 249 140 - 400 Thousand/uL Final         Failed - Valid encounter within last 6 months    Recent Outpatient Visits           3 months ago Generalized weakness   [facility] [place], Coralie Keens, NP   9 months ago Aortic atherosclerosis [facility])   [facility] [place], Coralie Keens, NP              Passed - Cr in normal range and within 360 days    Creat  Date Value Ref Range Status  01/23/2021 0.89 0.70 - 1.28 mg/dL Final         Signed Prescriptions Disp Refills   rosuvastatin (CRESTOR) 20 MG tablet 90 tablet 0    Sig: TAKE 1 TABLET BY MOUTH EVERY DAY     Cardiovascular:  Antilipid - Statins 2 Failed - 11/11/2021  1:21 AM      Failed - Lipid Panel in normal range within the last 12 months    Cholesterol, Total  Date Value Ref Range Status  08/27/2019 107 100 - 199 mg/dL Final   Cholesterol  Date Value Ref Range Status  01/23/2021 147 <200 mg/dL Final   LDL Cholesterol (Calc)  Date Value Ref Range Status  01/23/2021 75  mg/dL (calc) Final    Comment:    Reference range: <100 . Desirable range <100 mg/dL for primary prevention;   <70 mg/dL for patients with CHD or diabetic patients  with > or = 2 CHD risk factors. Marland Kitchen LDL-C is now calculated using the Martin-Hopkins  calculation, which is a validated novel method providing  better accuracy than the Friedewald equation in the  estimation of LDL-C.  Cresenciano Genre et al. Annamaria Helling. 3474;259(56): 2061-2068  (http://education.[employer].com/faq/FAQ164)    Direct LDL  Date Value Ref Range Status  06/17/2017 45.0 mg/dL Final    Comment:    Optimal:  <100 mg/dLNear or Above Optimal:  100-129 mg/dLBorderline High:  130-159 mg/dLHigh:  160-189 mg/dLVery High:  >190 mg/dL   HDL  Date Value Ref Range Status  01/23/2021 51 > OR = 40 mg/dL Final  08/27/2019 35 (L) >39 mg/dL Final   Triglycerides  Date Value Ref Range Status  01/23/2021 131 <150 mg/dL Final         Passed -  Cr in normal range and within 360 days    Creat  Date Value Ref Range Status  01/23/2021 0.89 0.70 - 1.28 mg/dL Final         Passed - Patient is not pregnant      Passed - Valid encounter within last 12 months    Recent Outpatient Visits           3 months ago Generalized weakness   [facility] [place], Coralie Keens, NP   9 months ago Aortic atherosclerosis [facility])   [facility] [place], Coralie Keens, NP

## 2021-11-13 NOTE — Telephone Encounter (Signed)
Requested Prescriptions  Pending Prescriptions Disp Refills  . clopidogrel (PLAVIX) 75 MG tablet [Pharmacy Med Name: CLOPIDOGREL 75 MG TABLET] 90 tablet 1    Sig: TAKE 1 TABLET BY MOUTH EVERY DAY     Hematology: Antiplatelets - clopidogrel Failed - 11/11/2021  1:21 AM      Failed - HCT in normal range and within 180 days    HCT  Date Value Ref Range Status  01/23/2021 44.0 38.5 - 50.0 % Final         Failed - HGB in normal range and within 180 days    Hemoglobin  Date Value Ref Range Status  01/23/2021 14.0 13.2 - 17.1 g/dL Final         Failed - PLT in normal range and within 180 days    Platelets  Date Value Ref Range Status  01/23/2021 249 140 - 400 Thousand/uL Final         Failed - Valid encounter within last 6 months    Recent Outpatient Visits          3 months ago Generalized weakness   [facility] [place], Coralie Keens, NP   9 months ago Aortic atherosclerosis [facility] [place])   [facility] [place], Coralie Keens, NP             Passed - Cr in normal range and within 360 days    Creat  Date Value Ref Range Status  01/23/2021 0.89 0.70 - 1.28 mg/dL Final         . rosuvastatin (CRESTOR) 20 MG tablet [Pharmacy Med Name: ROSUVASTATIN CALCIUM 20 MG TAB] 90 tablet 0    Sig: TAKE 1 TABLET BY MOUTH EVERY DAY     Cardiovascular:  Antilipid - Statins 2 Failed - 11/11/2021  1:21 AM      Failed - Lipid Panel in normal range within the last 12 months    Cholesterol, Total  Date Value Ref Range Status  08/27/2019 107 100 - 199 mg/dL Final   Cholesterol  Date Value Ref Range Status  01/23/2021 147 <200 mg/dL Final   LDL Cholesterol (Calc)  Date Value Ref Range Status  01/23/2021 75 mg/dL (calc) Final    Comment:    Reference range: <100 . Desirable range <100 mg/dL for primary prevention;   <70 mg/dL for patients with CHD or diabetic patients  with > or = 2 CHD risk factors. Marland Kitchen LDL-C is now calculated using the Martin-Hopkins  calculation, which is a  validated novel method providing  better accuracy than the Friedewald equation in the  estimation of LDL-C.  Cresenciano Genre et al. Annamaria Helling. 2993;716(96): 2061-2068  (http://education.[facility].com/faq/FAQ164)    Direct LDL  Date Value Ref Range Status  06/17/2017 45.0 mg/dL Final    Comment:    Optimal:  <100 mg/dLNear or Above Optimal:  100-129 mg/dLBorderline High:  130-159 mg/dLHigh:  160-189 mg/dLVery High:  >190 mg/dL   HDL  Date Value Ref Range Status  01/23/2021 51 > OR = 40 mg/dL Final  08/27/2019 35 (L) >39 mg/dL Final   Triglycerides  Date Value Ref Range Status  01/23/2021 131 <150 mg/dL Final         Passed - Cr in normal range and within 360 days    Creat  Date Value Ref Range Status  01/23/2021 0.89 0.70 - 1.28 mg/dL Final         Passed - Patient is not pregnant      Passed - Valid encounter  within last 12 months    Recent Outpatient Visits          3 months ago Generalized weakness   [place], NP   9 months ago Aortic atherosclerosis [facility])   [facility] [place], Coralie Keens, [place]

## 2021-11-13 NOTE — Telephone Encounter (Signed)
Rx refilled.

## 2021-11-15 ENCOUNTER — Encounter: Payer: Self-pay | Admitting: Internal Medicine

## 2021-11-15 DIAGNOSIS — Z8673 Personal history of transient ischemic attack (TIA), and cerebral infarction without residual deficits: Secondary | ICD-10-CM

## 2021-11-15 DIAGNOSIS — R2689 Other abnormalities of gait and mobility: Secondary | ICD-10-CM

## 2022-01-05 ENCOUNTER — Other Ambulatory Visit: Payer: Self-pay | Admitting: Internal Medicine

## 2022-01-08 NOTE — Telephone Encounter (Signed)
Requested Prescriptions  Pending Prescriptions Disp Refills  . clopidogrel (PLAVIX) 75 MG tablet [Pharmacy Med Name: CLOPIDOGREL 75 MG TABLET] 90 tablet 1    Sig: TAKE 1 TABLET BY MOUTH EVERY DAY     Hematology: Antiplatelets - clopidogrel Failed - 01/05/2022  7:31 PM      Failed - HCT in normal range and within 180 days    HCT  Date Value Ref Range Status  01/23/2021 44.0 38.5 - 50.0 % Final         Failed - HGB in normal range and within 180 days    Hemoglobin  Date Value Ref Range Status  01/23/2021 14.0 13.2 - 17.1 g/dL Final         Failed - PLT in normal range and within 180 days    Platelets  Date Value Ref Range Status  01/23/2021 249 140 - 400 Thousand/uL Final         Failed - Valid encounter within last 6 months    Recent Outpatient Visits          5 months ago Generalized weakness   [facility] [name], NP   11 months ago Aortic atherosclerosis [facility])   [facility] [place], Coralie Keens, NP             Passed - Cr in normal range and within 360 days    Creat  Date Value Ref Range Status  01/23/2021 0.89 0.70 - 1.28 mg/dL Final

## 2022-01-13 ENCOUNTER — Other Ambulatory Visit: Payer: Self-pay | Admitting: Internal Medicine

## 2022-01-15 NOTE — Telephone Encounter (Signed)
Requested Prescriptions  Pending Prescriptions Disp Refills  . pantoprazole (PROTONIX) 40 MG tablet [Pharmacy Med Name: PANTOPRAZOLE SOD DR 40 MG TAB] 90 tablet 1    Sig: TAKE 1 TABLET BY MOUTH EVERY DAY     Gastroenterology: Proton Pump Inhibitors Passed - 01/13/2022  1:19 AM      Passed - Valid encounter within last 12 months    Recent Outpatient Visits          6 months ago Generalized weakness   [facility] [place], Coralie Keens, NP   11 months ago Aortic atherosclerosis [facility])   [facility] [place], Coralie Keens, NP

## 2022-02-07 ENCOUNTER — Other Ambulatory Visit: Payer: Self-pay | Admitting: Internal Medicine

## 2022-02-07 NOTE — Telephone Encounter (Signed)
Will refill until patient cant schedule OV, patient needs OV for additional refills.  Requested Prescriptions  Pending Prescriptions Disp Refills  . rosuvastatin (CRESTOR) 20 MG tablet [Pharmacy Med Name: ROSUVASTATIN CALCIUM 20 MG TAB] 90 tablet 0    Sig: TAKE 1 TABLET BY MOUTH EVERY DAY     Cardiovascular:  Antilipid - Statins 2 Failed - 02/07/2022  1:54 AM      Failed - Cr in normal range and within 360 days    Creat  Date Value Ref Range Status  01/23/2021 0.89 0.70 - 1.28 mg/dL Final         Failed - Valid encounter within last 12 months    Recent Outpatient Visits          6 months ago Generalized weakness   [facility] [place], Coralie Keens, NP   1 year ago Aortic atherosclerosis [facility])   [facility] [place], [place] W, NP             Failed - Lipid Panel in normal range within the last 12 months    Cholesterol, Total  Date Value Ref Range Status  08/27/2019 107 100 - 199 mg/dL Final   Cholesterol  Date Value Ref Range Status  01/23/2021 147 <200 mg/dL Final   LDL Cholesterol (Calc)  Date Value Ref Range Status  01/23/2021 75 mg/dL (calc) Final    Comment:    Reference range: <100 . Desirable range <100 mg/dL for primary prevention;   <70 mg/dL for patients with CHD or diabetic patients  with > or = 2 CHD risk factors. Marland Kitchen LDL-C is now calculated using the Martin-Hopkins  calculation, which is a validated novel method providing  better accuracy than the Friedewald equation in the  estimation of LDL-C.  Cresenciano Genre et al. Annamaria Helling. 5997;741(42): 2061-2068  (http://education.[facility].com/faq/FAQ164)    Direct LDL  Date Value Ref Range Status  06/17/2017 45.0 mg/dL Final    Comment:    Optimal:  <100 mg/dLNear or Above Optimal:  100-129 mg/dLBorderline High:  130-159 mg/dLHigh:  160-189 mg/dLVery High:  >190 mg/dL   HDL  Date Value Ref Range Status  01/23/2021 51 > OR = 40 mg/dL Final  08/27/2019 35 (L) >39 mg/dL Final    Triglycerides  Date Value Ref Range Status  01/23/2021 131 <150 mg/dL Final         Passed - Patient is not pregnant

## 2022-03-10 ENCOUNTER — Emergency Department: Payer: Medicare Other

## 2022-03-10 ENCOUNTER — Observation Stay: Payer: Medicare Other

## 2022-03-10 ENCOUNTER — Encounter: Payer: Self-pay | Admitting: Radiology

## 2022-03-10 ENCOUNTER — Inpatient Hospital Stay
Admission: EM | Admit: 2022-03-10 | Discharge: 2022-03-12 | DRG: 069 | Disposition: A | Discharge status: Home Health | Payer: Medicare Other | Attending: Internal Medicine | Admitting: Internal Medicine

## 2022-03-10 DIAGNOSIS — F01B0 Vascular dementia, moderate, without behavioral disturbance, psychotic disturbance, mood disturbance, and anxiety: Secondary | ICD-10-CM | POA: Diagnosis present

## 2022-03-10 DIAGNOSIS — I679 Cerebrovascular disease, unspecified: Secondary | ICD-10-CM | POA: Diagnosis present

## 2022-03-10 DIAGNOSIS — I959 Hypotension, unspecified: Secondary | ICD-10-CM | POA: Diagnosis present

## 2022-03-10 DIAGNOSIS — I639 Cerebral infarction, unspecified: Secondary | ICD-10-CM | POA: Diagnosis present

## 2022-03-10 DIAGNOSIS — Z1152 Encounter for screening for COVID-19: Secondary | ICD-10-CM

## 2022-03-10 DIAGNOSIS — R269 Unspecified abnormalities of gait and mobility: Secondary | ICD-10-CM | POA: Diagnosis present

## 2022-03-10 DIAGNOSIS — Z23 Encounter for immunization: Secondary | ICD-10-CM

## 2022-03-10 DIAGNOSIS — R55 Syncope and collapse: Secondary | ICD-10-CM | POA: Insufficient documentation

## 2022-03-10 DIAGNOSIS — I6782 Cerebral ischemia: Principal | ICD-10-CM | POA: Diagnosis present

## 2022-03-10 DIAGNOSIS — Z7982 Long term (current) use of aspirin: Secondary | ICD-10-CM

## 2022-03-10 DIAGNOSIS — I951 Orthostatic hypotension: Secondary | ICD-10-CM | POA: Diagnosis present

## 2022-03-10 DIAGNOSIS — R4182 Altered mental status, unspecified: Principal | ICD-10-CM

## 2022-03-10 DIAGNOSIS — I69351 Hemiplegia and hemiparesis following cerebral infarction affecting right dominant side: Secondary | ICD-10-CM

## 2022-03-10 DIAGNOSIS — R54 Age-related physical debility: Secondary | ICD-10-CM | POA: Diagnosis present

## 2022-03-10 DIAGNOSIS — Z8673 Personal history of transient ischemic attack (TIA), and cerebral infarction without residual deficits: Secondary | ICD-10-CM | POA: Diagnosis present

## 2022-03-10 DIAGNOSIS — R29810 Facial weakness: Secondary | ICD-10-CM | POA: Diagnosis present

## 2022-03-10 DIAGNOSIS — G9341 Metabolic encephalopathy: Secondary | ICD-10-CM | POA: Diagnosis not present

## 2022-03-10 DIAGNOSIS — F0150 Vascular dementia without behavioral disturbance: Secondary | ICD-10-CM | POA: Diagnosis present

## 2022-03-10 DIAGNOSIS — E46 Unspecified protein-calorie malnutrition: Secondary | ICD-10-CM | POA: Diagnosis present

## 2022-03-10 DIAGNOSIS — G459 Transient cerebral ischemic attack, unspecified: Secondary | ICD-10-CM | POA: Diagnosis present

## 2022-03-10 DIAGNOSIS — R4781 Slurred speech: Secondary | ICD-10-CM | POA: Diagnosis present

## 2022-03-10 DIAGNOSIS — Z6821 Body mass index (BMI) 21.0-21.9, adult: Secondary | ICD-10-CM

## 2022-03-10 DIAGNOSIS — Z79899 Other long term (current) drug therapy: Secondary | ICD-10-CM

## 2022-03-10 LAB — CBC
HCT: 38.1 % — ABNORMAL LOW (ref 39.0–52.0)
Hemoglobin: 12.3 g/dL — ABNORMAL LOW (ref 13.0–17.0)
MCH: 27 pg (ref 26.0–34.0)
MCHC: 32.3 g/dL (ref 30.0–36.0)
MCV: 83.7 fL (ref 80.0–100.0)
Platelets: 225 10*3/uL (ref 150–400)
RBC: 4.55 MIL/uL (ref 4.22–5.81)
RDW: 14.3 % (ref 11.5–15.5)
WBC: 6.5 10*3/uL (ref 4.0–10.5)
nRBC: 0 % (ref 0.0–0.2)

## 2022-03-10 LAB — URINALYSIS, ROUTINE W REFLEX MICROSCOPIC
Bacteria, UA: NONE SEEN
Bilirubin Urine: NEGATIVE
Glucose, UA: NEGATIVE mg/dL
Ketones, ur: NEGATIVE mg/dL
Leukocytes,Ua: NEGATIVE
Nitrite: NEGATIVE
Protein, ur: NEGATIVE mg/dL
Specific Gravity, Urine: 1.003 — ABNORMAL LOW (ref 1.005–1.030)
Squamous Epithelial / HPF: NONE SEEN (ref 0–5)
pH: 8 (ref 5.0–8.0)

## 2022-03-10 LAB — COMPREHENSIVE METABOLIC PANEL
ALT: 15 U/L (ref 0–44)
AST: 18 U/L (ref 15–41)
Albumin: 3.4 g/dL — ABNORMAL LOW (ref 3.5–5.0)
Alkaline Phosphatase: 51 U/L (ref 38–126)
Anion gap: 3 — ABNORMAL LOW (ref 5–15)
BUN: 12 mg/dL (ref 8–23)
CO2: 28 mmol/L (ref 22–32)
Calcium: 8.7 mg/dL — ABNORMAL LOW (ref 8.9–10.3)
Chloride: 108 mmol/L (ref 98–111)
Creatinine, Ser: 0.94 mg/dL (ref 0.61–1.24)
GFR, Estimated: >60 mL/min (ref >60)
Glucose, Bld: 100 mg/dL — ABNORMAL HIGH (ref 70–99)
Potassium: 4.2 mmol/L (ref 3.5–5.1)
Sodium: 139 mmol/L (ref 135–145)
Total Bilirubin: 0.6 mg/dL (ref 0.3–1.2)
Total Protein: 6.6 g/dL (ref 6.5–8.1)

## 2022-03-10 LAB — PROTIME-INR
INR: 1 (ref 0.8–1.2)
Prothrombin Time: 13.5 seconds (ref 11.4–15.2)

## 2022-03-10 LAB — DIFFERENTIAL
Abs Immature Granulocytes: 0.02 10*3/uL (ref 0.00–0.07)
Basophils Absolute: 0 10*3/uL (ref 0.0–0.1)
Basophils Relative: 1 %
Eosinophils Absolute: 0.1 10*3/uL (ref 0.0–0.5)
Eosinophils Relative: 2 %
Immature Granulocytes: 0 %
Lymphocytes Relative: 29 %
Lymphs Abs: 1.8 10*3/uL (ref 0.7–4.0)
Monocytes Absolute: 0.5 10*3/uL (ref 0.1–1.0)
Monocytes Relative: 8 %
Neutro Abs: 3.9 10*3/uL (ref 1.7–7.7)
Neutrophils Relative %: 60 %

## 2022-03-10 LAB — TROPONIN I (HIGH SENSITIVITY)
Troponin I (High Sensitivity): 3 ng/L (ref <18)
Troponin I (High Sensitivity): 4 ng/L (ref <18)

## 2022-03-10 LAB — CREATININE, SERUM
Creatinine, Ser: 0.89 mg/dL (ref 0.61–1.24)
GFR, Estimated: >60 mL/min (ref >60)

## 2022-03-10 LAB — ETHANOL: Alcohol, Ethyl (B): <10 mg/dL (ref <10)

## 2022-03-10 LAB — APTT: aPTT: 32 seconds (ref 24–36)

## 2022-03-10 MED ORDER — ACETAMINOPHEN 325 MG PO TABS: 650.0000 mg | ORAL_TABLET | Freq: Four times a day (QID) | ORAL | Status: DC | PRN

## 2022-03-10 MED ORDER — SODIUM CHLORIDE 0.9 % IV SOLN
75.0000 mL/h | INTRAVENOUS | Status: DC
  Administered 2022-03-10: 75 mL/h via INTRAVENOUS

## 2022-03-10 MED ORDER — ASPIRIN 81 MG PO TBEC
81.0000 mg | DELAYED_RELEASE_TABLET | Freq: Every day | ORAL | Status: DC
  Administered 2022-03-10 – 2022-03-12 (×3): 81 mg via ORAL
  Filled 2022-03-10 (×3): qty 1

## 2022-03-10 MED ORDER — ORAL CARE MOUTH RINSE
15.0000 mL | OROMUCOSAL | Status: DC
  Administered 2022-03-11 – 2022-03-12 (×4): 15 mL via OROMUCOSAL
  Filled 2022-03-10 (×16): qty 15

## 2022-03-10 MED ORDER — MEMANTINE HCL 5 MG PO TABS
10.0000 mg | ORAL_TABLET | Freq: Two times a day (BID) | ORAL | Status: DC
  Administered 2022-03-10 – 2022-03-12 (×4): 10 mg via ORAL
  Filled 2022-03-10 (×4): qty 2

## 2022-03-10 MED ORDER — ACETAMINOPHEN 325 MG PO TABS: 650.0000 mg | ORAL_TABLET | ORAL | Status: DC | PRN

## 2022-03-10 MED ORDER — ACETAMINOPHEN 650 MG RE SUPP: 650.0000 mg | RECTAL | Status: DC | PRN

## 2022-03-10 MED ORDER — STROKE: EARLY STAGES OF RECOVERY BOOK: Freq: Once | Status: DC

## 2022-03-10 MED ORDER — SODIUM CHLORIDE 0.9 % IV SOLN: INTRAVENOUS | Status: AC

## 2022-03-10 MED ORDER — DONEPEZIL HCL 5 MG PO TABS
10.0000 mg | ORAL_TABLET | Freq: Every day | ORAL | Status: DC
  Administered 2022-03-10 – 2022-03-12 (×2): 10 mg via ORAL
  Filled 2022-03-10 (×2): qty 2

## 2022-03-10 MED ORDER — LORAZEPAM 2 MG/ML IJ SOLN
2.0000 mg | INTRAMUSCULAR | Status: DC | PRN
  Administered 2022-03-11: 2 mg via INTRAVENOUS
  Filled 2022-03-10: qty 1

## 2022-03-10 MED ORDER — SODIUM CHLORIDE 0.9 % IV BOLUS
1000.0000 mL | Freq: Once | INTRAVENOUS | Status: AC
  Administered 2022-03-10: 1000 mL via INTRAVENOUS

## 2022-03-10 MED ORDER — ENOXAPARIN SODIUM 40 MG/0.4ML IJ SOSY
40.0000 mg | PREFILLED_SYRINGE | INTRAMUSCULAR | Status: DC
  Administered 2022-03-10 – 2022-03-12 (×2): 40 mg via SUBCUTANEOUS
  Filled 2022-03-10 (×2): qty 0.4

## 2022-03-10 MED ORDER — ACETAMINOPHEN 650 MG RE SUPP: 650.0000 mg | Freq: Four times a day (QID) | RECTAL | Status: DC | PRN

## 2022-03-10 MED ORDER — ORAL CARE MOUTH RINSE: 15.0000 mL | OROMUCOSAL | Status: DC | PRN

## 2022-03-10 MED ORDER — ROSUVASTATIN CALCIUM 20 MG PO TABS
20.0000 mg | ORAL_TABLET | Freq: Every day | ORAL | Status: DC
  Administered 2022-03-11 – 2022-03-12 (×2): 20 mg via ORAL
  Filled 2022-03-10 (×2): qty 1

## 2022-03-10 MED ORDER — ONDANSETRON HCL 4 MG/2ML IJ SOLN: 4.0000 mg | Freq: Four times a day (QID) | INTRAMUSCULAR | Status: DC | PRN

## 2022-03-10 MED ORDER — SODIUM CHLORIDE 0.9% FLUSH
3.0000 mL | Freq: Two times a day (BID) | INTRAVENOUS | Status: DC
  Administered 2022-03-10 – 2022-03-12 (×4): 3 mL via INTRAVENOUS

## 2022-03-10 MED ORDER — PANTOPRAZOLE SODIUM 40 MG PO TBEC
40.0000 mg | DELAYED_RELEASE_TABLET | Freq: Every day | ORAL | Status: DC
  Administered 2022-03-10 – 2022-03-12 (×3): 40 mg via ORAL
  Filled 2022-03-10 (×3): qty 1

## 2022-03-10 MED ORDER — ONDANSETRON HCL 4 MG PO TABS: 4.0000 mg | ORAL_TABLET | Freq: Four times a day (QID) | ORAL | Status: DC | PRN

## 2022-03-10 NOTE — Assessment & Plan Note (Addendum)
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 Blood pressure currently elevated.

## 2022-03-10 NOTE — ED Notes (Signed)
Pt. Moved into ED rm 5.

## 2022-03-10 NOTE — ED Triage Notes (Signed)
Pt presents to the ED via [facility] due to AMS per family. Pt's family member is at bedside, stating pt became unresponsive at the table with facial droop and drooling. Pt has hx stroke and TIAs. Family member states patient has not been at his baseline x2-3 days.

## 2022-03-10 NOTE — ED Provider Notes (Signed)
[facility] Provider Note    Event Date/Time   First MD Initiated Contact with Patient 03/10/22 1727     (approximate)   History   Altered Mental Status   HPI  Christopher Murphy is a 76 y.o. male with past medical history significant for prior CVA with residual right-sided deficits, presents to the emergency department with his daughter-in-law for altered mental status.  Patient's daughter-in-law who he lives with states that he has not been acting his normal self for the past 2 to 3 days.  Normally is able to talk and have somewhat of conversation but does have residual right-sided deficits and memory issues.  Been more confused, slurring his words and saying things that do not make sense over the past couple of days.  Tonight while sitting at the table had an episode where he lost consciousness.  Left side of his face had a facial droop and when EMS arrived he had a low blood pressure of 86/60 and was given a bolus of fluid.  When he arrived to the emergency department his blood pressure was 627 systolic.  Denies recent falls or head trauma.  No prior history of seizures.  Continues to be confused.     Physical Exam   Triage Vital Signs: ED Triage Vitals  Enc Vitals Group     BP 03/10/22 1440 121/72     Pulse Rate 03/10/22 1440 65     Resp 03/10/22 1440 16     Temp 03/10/22 1440 98.9 F (37.2 C)     Temp Source 03/10/22 1440 Oral     SpO2 03/10/22 1440 100 %     Weight 03/10/22 1440 140 lb (63.5 kg)     Height 03/10/22 1440 '5\' 8"'$  (1.727 m)     Head Circumference --      Peak Flow --      Pain Score 03/10/22 1449 0     Pain Loc --      Pain Edu? --      Excl. in [place]? --     Most recent vital signs: Vitals:   03/10/22 2030 03/10/22 2100  BP: 133/75 (!) 144/75  Pulse: 79 84  Resp: 17 17  Temp:    SpO2: 100% 97%    Physical Exam Constitutional:      Appearance: He is well-developed.  HENT:     Head: Atraumatic.  Eyes:      Conjunctiva/sclera: Conjunctivae normal.  Cardiovascular:     Rate and Rhythm: Regular rhythm.  Pulmonary:     Effort: No respiratory distress.  Musculoskeletal:        General: Normal range of motion.     Cervical back: Normal range of motion.  Skin:    General: Skin is warm.  Neurological:     Mental Status: He is alert. He is disoriented.     Comments: Following simple commands.  Stating nonsense words according to his daughter in law who is translating for him.  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  Differential diagnosis including new CVA, ACS, dysrhythmia, dehydration, infectious process, ACS, seizure disorder  On arrival patient's blood pressure is normal and has a blood pressure of 134/81.  Treated with 1 L of IV fluids   EKG  I, Nathaniel Man, the attending physician,  personally viewed and interpreted this ECG.   Rate: Normal  Rhythm: Normal sinus  Axis: Normal  Intervals: Normal  ST&T Change: None  No tachycardic or bradycardic dysrhythmias while on cardiac telemetry.  RADIOLOGY I independently reviewed imaging, my interpretation of imaging: CT scan of the head without signs of intracranial hemorrhage    CT scan of the head was read as no acute intracranial abnormalities  ED Results / Procedures / Treatments   Labs (all labs ordered are listed, but only abnormal results are displayed) Labs interpreted as -  No significant leukocytosis.  Creatinine is at his baseline.  No significant electrolyte abnormalities.  UA is currently pending.  COVID and influenza testing obtained given altered mental status and cough, will evaluate for possible infectious etiology to explain his altered mental status  Labs Reviewed  CBC - Abnormal; Notable for the following components:      Result Value   Hemoglobin 12.3 (*)    HCT 38.1  (*)    All other components within normal limits  COMPREHENSIVE METABOLIC PANEL - Abnormal; Notable for the following components:   Glucose, Bld 100 (*)    Calcium 8.7 (*)    Albumin 3.4 (*)    Anion gap 3 (*)    All other components within normal limits  URINALYSIS, ROUTINE W REFLEX MICROSCOPIC - Abnormal; Notable for the following components:   Color, Urine COLORLESS (*)    APPearance CLEAR (*)    Specific Gravity, Urine 1.003 (*)    Hgb urine dipstick MODERATE (*)    All other components within normal limits  RESP PANEL BY RT-PCR (FLU A&B, COVID) [id number]  PROTIME-INR  APTT  DIFFERENTIAL  ETHANOL  CBG MONITORING, ED  TROPONIN I (HIGH SENSITIVITY)  TROPONIN I (HIGH SENSITIVITY)    Consulted hospitalist for admission for altered mental status, possible new CVA or seizure.  No concern for subclinical seizures.   PROCEDURES:  Critical Care performed: No  Procedures  Patient's presentation is most consistent with acute presentation with potential threat to life or bodily function.   MEDICATIONS ORDERED IN ED: Medications  sodium chloride 0.9 % bolus 1,000 mL (0 mLs Intravenous Stopped 03/10/22 2033)    FINAL CLINICAL IMPRESSION(S) / ED DIAGNOSES   Final diagnoses:  Altered mental status, unspecified altered mental status type  Syncope, unspecified syncope type     Rx / DC Orders   ED Discharge Orders     None        Note:  This document was prepared using Dragon voice recognition software and may include unintentional dictation errors.   Nathaniel Man, MD 03/10/22 2110

## 2022-03-10 NOTE — ED Provider Triage Note (Signed)
Emergency Medicine Provider Triage Evaluation Note  Christopher Murphy , a 76 y.o. male  was evaluated in triage.  Per daughter, he has frequent episodes of TIAs and history of CVA. He was last at his baseline 2 days ago. Today, he was had finished eating and stood up and had facial drooping and had a gurgling sound. He was not responding to them either. On EMS arrival, he was hypotensive but started coming around more. He is back to where he was 2 days ago.  Physical Exam  BP 121/72 (BP Location: Right Arm)   Pulse 65   Temp 98.9 F (37.2 C) (Oral)   Resp 16   Ht '5\' 8"'$  (1.727 m)   Wt 63.5 kg   SpO2 100%   BMI 21.29 kg/m  Gen:   Awake, no distress   Resp:  Normal effort  MSK:   Moves extremities without difficulty  Other:    Medical Decision Making  Medically screening exam initiated at 2:55 PM.  Appropriate orders placed.  Christopher Murphy was informed that the remainder of the evaluation will be completed by another provider, this initial triage assessment does not replace that evaluation, and the importance of remaining in the ED until their evaluation is complete.  Unsure if NIH will be accurate due to language barrier and history of CVA.   Victorino Dike, FNP 03/10/22 1500

## 2022-03-10 NOTE — Assessment & Plan Note (Addendum)
Syncope/episode of unresponsiveness Vascular dementia Patient reportedly was confused for the past 2 days and then had an episode of unresponsiveness Etiology uncertain and differential includes orthostatic hypotension, TIA/CVA,  Symptomatic hypotension with delirium in view of underlying dementia Low suspicion for infectious etiology Low suspicion for seizure as patient had negative EEG in June We will get stroke and syncope work-up and repeat EEG (telemetry, echo and MRI, had negative Doppler in July so will not repeat) Ordered D-dimer-mildly elevated at 0.65 which also seems chronic. MRI concern of acute/subacute infarct patient has very significant underlying white matter disease which will make it difficult to have definitive assessment. Neurology is recommending completion of stroke workup and EEG tomorrow. PT/OT are recommending home health Patient will also need speech therapy on discharge -Continue aspirin and statin

## 2022-03-10 NOTE — Assessment & Plan Note (Addendum)
Continue aspirin and rosuvastatin.  Was previously on Plavix but this was discontinued by his neurologist Supportive care, assistance with transfers

## 2022-03-10 NOTE — ED Triage Notes (Signed)
Pt in via EMS from home with c/o sudden onset AMS while at kitchen table. 86/60. EMS reports pt also with facial droop from family. #18g to left AC, pt was given 548ms of NS, BP 110/65, 98% RA, CBG 111

## 2022-03-10 NOTE — Assessment & Plan Note (Signed)
Continue donepezil and Namenda Delirium precautions

## 2022-03-10 NOTE — H&P (Incomplete)
History and Physical    Patient: Christopher Murphy NIO:[id number] DOB: 1945-04-22 DOA: 03/10/2022 DOS: the patient was seen and examined on 03/10/2022 PCP: [name], NP  Patient coming from: Home  Chief Complaint:  Chief Complaint  Patient presents with   Altered Mental Status    HPI: Christopher Murphy is a 76 y.o. 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  Most of the history is given at the bedside by his son who states that he has been confused for the past 2 days, not acting himself until the event tonight.    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  His dementia medication was increased and follow-up was given for 1 year.  EEG done in June showed no seizure activity.  He also had a carotid study in July that showed no significant stenosis.  On arrival of EMS, BP was 86/60, fluid responsive to SBP of 130 following 500 mL bolus of NS. ED course and data review: By arrival to the ED, patient was awake and alert, vitals were within normal limits.  Labs including CBC, CMP, troponin, EtOH, urinalysis were all unremarkable except for hemoglobin of 12.3.EKG, personally viewed and interpreted showed NSR at 68 with no ischemic ST-T wave changes. CT head nonacute, showing the following: IMPRESSION: 1. No acute intracranial abnormality. 2. Stable non contrast CT appearance of the brain since last year with advanced intracranial artery  dolichoectasia, advanced white matter disease, chronic left PCA infarct.  Patient given a fluid bolus and hospitalist consulted for admission     Past Medical History:  Diagnosis Date   Blood clots in brain 2009   Stroke [facility])    Past Surgical History:  Procedure Laterality Date   COLONOSCOPY WITH PROPOFOL N/A 04/30/2019   Procedure: COLONOSCOPY WITH PROPOFOL;  Surgeon: Jonathon Bellows, MD;  Location: [facility] ENDOSCOPY;  Service: Gastroenterology;  Laterality: N/A;   PROSTATECTOMY  1980   Social History:  reports that he has never smoked. He has never used smokeless tobacco. He reports that he does not drink alcohol and does not use drugs.  No Known Allergies  Family History  Problem Relation Age of Onset   AAA (abdominal aortic aneurysm) Neg Hx     Prior to Admission medications   Medication Sig Start Date End Date Taking? Authorizing Provider  ASPIRIN LOW DOSE 81 MG EC tablet TAKE 1 TABLET BY MOUTH EVERY DAY 08/10/21   [name], NP  albuterol (VENTOLIN HFA) 108 (90 Base) MCG/ACT inhaler Inhale 2 puffs into the lungs every 6 (six) hours as needed for wheezing or shortness of breath. 06/22/21   Mar Daring, PA-C  camphor-menthol [facility]) lotion Apply 1 application topically as needed for itching. 06/29/20   [name], NP  cetirizine (ZYRTEC) 10 MG tablet Take 1 tablet (10 mg total) by mouth daily. Patient taking differently: Take 10 mg by mouth daily as needed for allergies or rhinitis. 06/11/17   [name], NP  donepezil (  ARICEPT) 10 MG tablet TAKE 1 TABLET BY MOUTH EVERYDAY AT BEDTIME 11/13/21   Garvin Fila, MD  doxycycline (VIBRA-TABS) 100 MG tablet Take 1 tablet (100 mg total) by mouth 2 (two) times daily. 06/22/21   Mar Daring, PA-C  memantine (NAMENDA) 10 MG tablet TAKE 1 TABLET BY MOUTH TWICE A DAY 07/06/21   Garvin Fila, MD  pantoprazole (PROTONIX) 40 MG tablet TAKE 1 TABLET BY MOUTH EVERY DAY 01/15/22   [name], NP  Polyethyl  Glycol-Propyl Glycol (SYSTANE) 0.4-0.3 % SOLN Apply 1 drop to eye 2 (two) times daily. Patient taking differently: Place 1 drop into both eyes 2 (two) times daily. 06/12/16   [name], NP  polyethylene glycol (MIRALAX / GLYCOLAX) 17 g packet Take 17 g by mouth daily. 05/14/20   Ghimire, Henreitta Leber, MD  rosuvastatin (CRESTOR) 20 MG tablet TAKE 1 TABLET BY MOUTH EVERY DAY 02/07/22   [name], NP  triamcinolone cream (KENALOG) 0.1 % APPLY TO AFFECTED AREA TWICE A DAY AS NEEDED FOR ITCHING 05/12/21   [name], NP    Physical Exam: Vitals:   03/10/22 1930 03/10/22 2000 03/10/22 2030 03/10/22 2100  BP:  134/81 133/75 (!) 144/75  Pulse: 82 88 79 84  Resp: (!) '31 18 17 17  '$ Temp:      TempSrc:      SpO2: 100% 100% 100% 97%  Weight:      Height:       Physical Exam Vitals and nursing note reviewed.  Constitutional:      General: He is not in acute distress.    Appearance: He is cachectic.     Comments: restless  HENT:     Head: Normocephalic and atraumatic.  Cardiovascular:     Rate and Rhythm: Normal rate and regular rhythm.     Heart sounds: Normal heart sounds.  Pulmonary:     Effort: Pulmonary effort is normal.     Breath sounds: Normal breath sounds.  Abdominal:     Palpations: Abdomen is soft.     Tenderness: There is no abdominal tenderness.  Neurological:     Comments: Difficult to assess as patient is very restless not staying still on the stretcher and not following commands.  He appears to be moving all extremities equally     Labs on Admission: I have personally reviewed following labs and imaging studies  CBC: Recent Labs  Lab 03/10/22 1455  WBC 6.5  NEUTROABS 3.9  HGB 12.3*  HCT 38.1*  MCV 83.7  PLT 086   Basic Metabolic Panel: Recent Labs  Lab 03/10/22 1455  NA 139  K 4.2  CL 108  CO2 28  GLUCOSE 100*  BUN 12  CREATININE 0.94  CALCIUM 8.7*   GFR: Estimated Creatinine Clearance: 60 mL/min (by C-G formula based on SCr of 0.94  mg/dL). Liver Function Tests: Recent Labs  Lab 03/10/22 1455  AST 18  ALT 15  ALKPHOS 51  BILITOT 0.6  PROT 6.6  ALBUMIN 3.4*   No results for input(s): "LIPASE", "AMYLASE" in the last 168 hours. No results for input(s): "AMMONIA" in the last 168 hours. Coagulation Profile: Recent Labs  Lab 03/10/22 1455  INR 1.0   Cardiac Enzymes: No results for input(s): "CKTOTAL", "CKMB", "CKMBINDEX", "TROPONINI" in the last 168 hours. BNP (last 3 results) No results for input(s): "PROBNP" in the last 8760 hours. HbA1C: No results for input(s): "HGBA1C" in the last 72 hours. CBG: No results for  input(s): "GLUCAP" in the last 168 hours. Lipid Profile: No results for input(s): "CHOL", "HDL", "LDLCALC", "TRIG", "CHOLHDL", "LDLDIRECT" in the last 72 hours. Thyroid Function Tests: No results for input(s): "TSH", "T4TOTAL", "FREET4", "T3FREE", "THYROIDAB" in the last 72 hours. Anemia Panel: No results for input(s): "VITAMINB12", "FOLATE", "FERRITIN", "TIBC", "IRON", "RETICCTPCT" in the last 72 hours. Urine analysis:    Component Value Date/Time   COLORURINE COLORLESS (A) 03/10/2022 1903   APPEARANCEUR CLEAR (A) 03/10/2022 1903   LABSPEC 1.003 (L) 03/10/2022 1903   PHURINE 8.0 03/10/2022 1903   GLUCOSEU NEGATIVE 03/10/2022 1903   HGBUR MODERATE (A) 03/10/2022 1903   BILIRUBINUR NEGATIVE 03/10/2022 1903   BILIRUBINUR neg 11/13/2019 1741   KETONESUR NEGATIVE 03/10/2022 1903   PROTEINUR NEGATIVE 03/10/2022 1903   UROBILINOGEN 0.2 11/13/2019 1741   NITRITE NEGATIVE 03/10/2022 1903   LEUKOCYTESUR NEGATIVE 03/10/2022 1903    Radiological Exams on Admission: CT HEAD WO CONTRAST  Result Date: 03/10/2022 CLINICAL DATA:  76 year old male with sudden onset altered mental status and hypotensive. EXAM: CT HEAD WITHOUT CONTRAST TECHNIQUE: Contiguous axial images were obtained from the base of the skull through the vertex without intravenous contrast. RADIATION DOSE REDUCTION: This exam was  performed according to the departmental dose-optimization program which includes automated exposure control, adjustment of the mA and/or kV according to patient size and/or use of iterative reconstruction technique. COMPARISON:  Head CT 05/09/2020.  Brain MRI 10/28/2019. FINDINGS: Brain: Advanced chronic cerebral white matter disease. Chronic left PCA territory infarct with occipital lobe encephalomalacia. Chronic mild midline superior cerebellar encephalomalacia. Stable gray-white matter differentiation throughout the brain. Stable cerebral volume. No midline shift, ventriculomegaly, mass effect, evidence of mass lesion, intracranial hemorrhage or evidence of cortically based acute infarction. Vascular: Generalized intracranial artery tortuosity, dolichoectasia is chronic (chronically tortuous 7 mm diameter basilar artery). No suspicious intracranial vascular hyperdensity. Skull: No acute osseous abnormality identified. Sinuses/Orbits: Well aerated sinuses, chronic left ethmoid mucosal thickening and opacification not significantly changed. Other: No acute orbit or scalp soft tissue finding. IMPRESSION: 1. No acute intracranial abnormality. 2. Stable non contrast CT appearance of the brain since last year with advanced intracranial artery dolichoectasia, advanced white matter disease, chronic left PCA infarct. Electronically Signed   By: Genevie Ann M.D.   On: 03/10/2022 15:34     Data Reviewed: Relevant notes from primary care and specialist visits, past discharge summaries as available in EHR, including Care Everywhere. Prior diagnostic testing as pertinent to current admission diagnoses Updated medications and problem lists for reconciliation ED course, including vitals, labs, imaging, treatment and response to treatment Triage notes, nursing and pharmacy notes and ED provider's notes Notable results as noted in HPI   Assessment and Plan: * Acute metabolic encephalopathy Syncope/episode of  unresponsiveness Vascular dementia Patient reportedly was confused for the past 2 days and then had an episode of unresponsiveness Etiology uncertain and differential includes orthostatic hypotension, TIA/CVA,  Symptomatic hypotension with delirium in view of underlying dementia Low suspicion for infectious etiology Low suspicion for seizure as patient had negative EEG in June We will get stroke and syncope work-up and repeat EEG (telemetry, echo and MRI, had negative Doppler in July so will not repeat) Ordered D-dimer Aspirin 325, check lipids Neurology consult Neurologic checks with fall and aspiration precautions PT OT and speech therapy consult  Hypotension 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  Was previously on Plavix but this was discontinued by his neurologist Supportive care, assistance with transfers  Vascular dementia ([place]) Continue donepezil and Namenda Delirium precautions        DVT prophylaxis: Lovenox  Consults: none  Advance Care Planning:   Code Status: Prior   Family Communication: son at bedside  Disposition Plan: Back to previous home environment  Severity of Illness: The appropriate patient status for this patient is OBSERVATION. Observation status is judged to be reasonable and necessary in order to provide the required intensity of service to ensure the patient's safety. The patient's presenting symptoms, physical exam findings, and initial radiographic and laboratory data in the context of their medical condition is felt to place them at decreased risk for further clinical deterioration. Furthermore, it is anticipated  that the patient will be medically stable for discharge from the hospital within 2 midnights of admission.   Author: Athena Masse, MD 03/10/2022 9:27 PM  For on call review [email or web address].

## 2022-03-11 ENCOUNTER — Observation Stay: Payer: Medicare Other

## 2022-03-11 ENCOUNTER — Observation Stay
Admit: 2022-03-11 | Discharge: 2022-03-11 | Disposition: A | Discharge status: Home / Self Care | Payer: Medicare Other | Attending: Internal Medicine | Admitting: Internal Medicine

## 2022-03-11 DIAGNOSIS — R29810 Facial weakness: Secondary | ICD-10-CM | POA: Diagnosis present

## 2022-03-11 DIAGNOSIS — R4182 Altered mental status, unspecified: Secondary | ICD-10-CM | POA: Diagnosis not present

## 2022-03-11 DIAGNOSIS — E46 Unspecified protein-calorie malnutrition: Secondary | ICD-10-CM | POA: Diagnosis present

## 2022-03-11 DIAGNOSIS — I951 Orthostatic hypotension: Secondary | ICD-10-CM | POA: Diagnosis present

## 2022-03-11 DIAGNOSIS — R269 Unspecified abnormalities of gait and mobility: Secondary | ICD-10-CM | POA: Diagnosis present

## 2022-03-11 DIAGNOSIS — R54 Age-related physical debility: Secondary | ICD-10-CM | POA: Diagnosis present

## 2022-03-11 DIAGNOSIS — G9341 Metabolic encephalopathy: Secondary | ICD-10-CM | POA: Diagnosis present

## 2022-03-11 DIAGNOSIS — I679 Cerebrovascular disease, unspecified: Secondary | ICD-10-CM | POA: Diagnosis present

## 2022-03-11 DIAGNOSIS — Z7982 Long term (current) use of aspirin: Secondary | ICD-10-CM | POA: Diagnosis not present

## 2022-03-11 DIAGNOSIS — I69351 Hemiplegia and hemiparesis following cerebral infarction affecting right dominant side: Secondary | ICD-10-CM | POA: Diagnosis not present

## 2022-03-11 DIAGNOSIS — F01B0 Vascular dementia, moderate, without behavioral disturbance, psychotic disturbance, mood disturbance, and anxiety: Secondary | ICD-10-CM | POA: Diagnosis present

## 2022-03-11 DIAGNOSIS — Z8673 Personal history of transient ischemic attack (TIA), and cerebral infarction without residual deficits: Secondary | ICD-10-CM | POA: Diagnosis present

## 2022-03-11 DIAGNOSIS — R4781 Slurred speech: Secondary | ICD-10-CM | POA: Diagnosis present

## 2022-03-11 DIAGNOSIS — Z1152 Encounter for screening for COVID-19: Secondary | ICD-10-CM | POA: Diagnosis not present

## 2022-03-11 DIAGNOSIS — Z79899 Other long term (current) drug therapy: Secondary | ICD-10-CM | POA: Diagnosis not present

## 2022-03-11 DIAGNOSIS — I639 Cerebral infarction, unspecified: Secondary | ICD-10-CM | POA: Diagnosis present

## 2022-03-11 DIAGNOSIS — Z6821 Body mass index (BMI) 21.0-21.9, adult: Secondary | ICD-10-CM | POA: Diagnosis not present

## 2022-03-11 DIAGNOSIS — I6782 Cerebral ischemia: Secondary | ICD-10-CM | POA: Diagnosis present

## 2022-03-11 DIAGNOSIS — Z23 Encounter for immunization: Secondary | ICD-10-CM | POA: Diagnosis present

## 2022-03-11 LAB — D-DIMER, QUANTITATIVE: D-Dimer, Quant: 0.65 ug/mL-FEU — ABNORMAL HIGH (ref 0.00–0.50)

## 2022-03-11 LAB — RESP PANEL BY RT-PCR (FLU A&B, COVID) ARPGX2
Influenza A by PCR: NEGATIVE
Influenza B by PCR: NEGATIVE
SARS Coronavirus 2 by RT PCR: NEGATIVE

## 2022-03-11 LAB — LIPID PANEL
Cholesterol: 115 mg/dL (ref 0–200)
HDL: 43 mg/dL (ref >40)
LDL Cholesterol: 60 mg/dL (ref 0–99)
Total CHOL/HDL Ratio: 2.7 RATIO
Triglycerides: 58 mg/dL (ref <150)
VLDL: 12 mg/dL (ref 0–40)

## 2022-03-11 LAB — ECHOCARDIOGRAM COMPLETE
AR max vel: 2.5 cm2
AV Area VTI: 2.36 cm2
AV Area mean vel: 2.22 cm2
AV Mean grad: 2 mmHg
AV Peak grad: 3.5 mmHg
Ao pk vel: 0.93 m/s
Area-P 1/2: 3.5 cm2
Height: 68 in
S' Lateral: 2.7 cm
Weight: 2240 oz

## 2022-03-11 MED ORDER — INFLUENZA VAC A&B SA ADJ QUAD 0.5 ML IM PRSY
0.5000 mL | PREFILLED_SYRINGE | INTRAMUSCULAR | Status: AC
  Administered 2022-03-12: 0.5 mL via INTRAMUSCULAR
  Filled 2022-03-11: qty 0.5

## 2022-03-11 MED ORDER — LORAZEPAM 2 MG/ML IJ SOLN: 0.5000 mg | INTRAMUSCULAR | Status: DC | PRN

## 2022-03-11 MED ORDER — IOHEXOL 350 MG/ML SOLN
75.0000 mL | Freq: Once | INTRAVENOUS | Status: AC | PRN
  Administered 2022-03-11: 75 mL via INTRAVENOUS

## 2022-03-11 NOTE — ED Notes (Signed)
Provider at bedside assessing pt

## 2022-03-11 NOTE — TOC Initial Note (Signed)
Transition of Care [facility]) - Initial/Assessment Note    Patient Details  Name: Christopher Murphy MRN: [id number] Date of Birth: Aug 13, 1945  Transition of Care [facility]) CM/SW Contact:    [address], [place] Phone Number: 03/11/2022, 2:08 PM  Clinical Narrative:                  CSW spoke with pt's daughter in law, she confirms she has a wheelchair in the home and that prior to this hospitalization pt was active with [facility], when pt is ready to dc, [facility] will be notified to resume services.        Patient Goals and CMS Choice        Expected Discharge Plan and Services                                                Prior Living Arrangements/Services                       Activities of Daily Living      Permission Sought/Granted                  Emotional Assessment              Admission diagnosis:  TIA (transient ischemic attack) [G45.9] CVA (cerebrovascular accident) [facility]) [I63.9] Patient Active Problem List   Diagnosis Date Noted   CVA (cerebrovascular accident) ([place]) 03/11/2022   Syncope and collapse 80/32/1224   Acute metabolic encephalopathy 82/50/0370   Hypotension 03/10/2022   Hemiparesis affecting right side as late effect of cerebrovascular accident (CVA) ([place]) 03/10/2022   GERD (gastroesophageal reflux disease) 07/19/2021   Iron deficiency anemia secondary to inadequate dietary iron intake 01/23/2021   Aortic atherosclerosis ([place]) 01/23/2021   Thoracic aortic aneurysm ([place]) 06/02/2020   Vascular dementia ([place]) 10/28/2019   Chronic cough 10/19/2019   TIA (transient ischemic attack) 04/05/2016   PCP:  [name], NP Pharmacy:   [id number] [id number]: [phone]: [phone]    Social Determinants of Health (SDOH) Interventions    Readmission Risk Interventions     No data to display

## 2022-03-11 NOTE — Evaluation (Signed)
Speech Language Pathology Evaluation Patient Details Name: Christopher Murphy MRN: [id number] DOB: 09-Jun-1945 Today's Date: 03/11/2022 Time: 1020-1100 SLP Time Calculation (min) (ACUTE ONLY): 40 min  Problem List:  Patient Active Problem List   Diagnosis Date Noted   Syncope and collapse 52/77/8242   Acute metabolic encephalopathy 35/36/1443   Hypotension 03/10/2022   Hemiparesis affecting right side as late effect of cerebrovascular accident (CVA) ([place]) 03/10/2022   GERD (gastroesophageal reflux disease) 07/19/2021   Iron deficiency anemia secondary to inadequate dietary iron intake 01/23/2021   Aortic atherosclerosis ([place]) 01/23/2021   Thoracic aortic aneurysm ([place]) 06/02/2020   Vascular dementia ([place]) 10/28/2019   Chronic cough 10/19/2019   TIA (transient ischemic attack) 04/05/2016   Past Medical History:  Past Medical History:  Diagnosis Date   Blood clots in brain 2009   Stroke [facility])    Past Surgical History:  Past Surgical History:  Procedure Laterality Date   COLONOSCOPY WITH PROPOFOL N/A 04/30/2019   Procedure: COLONOSCOPY WITH PROPOFOL;  Surgeon: Jonathon Bellows, MD;  Location: [facility] ENDOSCOPY;  Service: Gastroenterology;  Laterality: N/A;   PROSTATECTOMY  1980   HPI:  Per H&P "Christopher Murphy is a 76 y.o. 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  Most of the history is given at the bedside by his son who states that he has been confused for the past 2 days, not acting himself until the event tonight.     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  His dementia medication was increased and follow-up was given for 1 year.  EEG done in June showed no seizure activity.  He also had a carotid study in July that showed no significant stenosis.  On arrival of EMS, BP was 86/60, fluid responsive to SBP of 130 following 500 mL bolus of NS.  ED course and data review: By arrival to the ED, patient was awake and alert, vitals were within normal limits.  Labs including CBC, CMP, troponin, EtOH, urinalysis were all unremarkable except for hemoglobin of 12.3.EKG, personally viewed and interpreted showed NSR at 68 with no ischemic ST-T wave changes.  CT head nonacute, showing the following:  IMPRESSION:  1. No acute intracranial abnormality.  2. Stable non contrast CT appearance of the brain since last year  with advanced intracranial artery dolichoectasia, advanced white  matter disease, chronic left PCA infarct." MRI "1. Suspect new areas of acute/subacute ischemia in the right  parietal and left frontal lobes. No hemorrhage or mass effect.  2. Severe chronic ischemic changes of the subcortical and deep white  matter. Advanced volume loss."   Assessment / Plan / Recommendation Clinical Impression  Pt alert and pleasant. HOH. Daughter-in-law present. Offered interpreter and daughter-in-law declined stating she would prefer to interpret for patient. Noted pt with hx of vascular dementia with cognitive deficits affecting "speech difficulties, memory loss, confusion, decreased attention, disorientation..." per OP Neurology note on 08/09/21. Daughter-in-law endorsed that pt could carry brief conversation in [place] PTA.  Assessment completed via informal means. 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 Pt  with significant difficulty following 1-step commands and answering basic biographical/environmental yes/no questions. Pt's ability to follow commands improved to ~50% with direct clinician modeling. Pt with poor confrontation naming and reduced ability to repeat short phrases. Pt's speech is non-fluent and marked by neologisms (per daughter-in-law), wordfinding difficulty, and articulatory imprecision with mild impact on speech intelligibility. Pt does not appear to be aware of current deficits. Pt's hearing status and baseline cognitive deficits likely affecting overall participation in today's assessment.   Based on today's assessment, recommend post-acute SLP services for aphasia and 24 hr supervision/assistance at d/c.   SLP to f/u per POC for continued speech/language tx and follow up family education while pt in house.    SLP Assessment  SLP Recommendation/Assessment: Patient needs continued Speech Lanaguage Pathology Services SLP Visit Diagnosis: Dysphagia, oral phase (R13.11);Aphasia (R47.01);Dysarthria and anarthria (R47.1);Cognitive communication deficit (R41.841)    Recommendations for follow up therapy are one component of a multi-disciplinary discharge planning process, led by the attending physician.  Recommendations may be updated based on patient status, additional functional criteria and insurance authorization.    Follow Up Recommendations  Skilled nursing-short term rehab (<3 hours/day)    Assistance Recommended at Discharge     Functional Status Assessment Patient has had a recent decline in their functional status and demonstrates the ability to make significant improvements in function in a reasonable and predictable amount of time.  Frequency and Duration min 2x/week  2 weeks      SLP Evaluation Cognition  Overall Cognitive Status: Impaired/Different from baseline Arousal/Alertness: Awake/alert Orientation Level: Oriented to person Memory: Impaired Memory  Impairment: Storage deficit;Retrieval deficit;Decreased recall of new information;Decreased short term memory Awareness: Impaired Awareness Impairment: Intellectual impairment;Emergent impairment;Anticipatory impairment Problem Solving: Impaired Problem Solving Impairment: Functional basic Safety/Judgment: Impaired       Comprehension  Auditory Comprehension Overall Auditory Comprehension: Impaired Yes/No Questions: Impaired Basic Biographical Questions: 0-25% accurate Basic Immediate Environment Questions: 0-24% accurate Commands: Impaired One Step Basic Commands: 0-24% accurate Interfering Components: Attention;Hearing    Expression Expression Primary Mode of Expression: Verbal Verbal Expression Overall Verbal Expression: Impaired Initiation: Impaired Automatic Speech: Social Response (some social automatic speech noted) Level of Generative/Spontaneous Verbalization:  (few short phrases noted) Pragmatics: Impairment Impairments: Abnormal affect;Topic maintenance;Topic appropriateness Interfering Components: Attention Written Expression Dominant Hand: Right Written Expression:  (wrote first letter of name)   Oral / Motor  Oral Motor/Sensory Function Overall Oral Motor/Sensory Function: Mild impairment Lingual ROM: Reduced right;Reduced left (likely due to reduced ability to follow commands; suspect R lingual weakness based on post-swallow oral residual) Motor Speech Overall Motor Speech: Impaired Respiration: Within functional limits Phonation: Normal Resonance: Within functional limits Articulation: Impaired Level of Impairment: Word Intelligibility: Intelligibility reduced Word: 75-100% accurate Phrase: 75-100% accurate Motor Planning: Impaired Level of Impairment: Phrase Motor Speech Errors: Inconsistent;Unaware           Cherrie Gauze, M.S., [facility] [phone] [place])  Quintella Baton 03/11/2022, 12:51 PM

## 2022-03-11 NOTE — ED Notes (Addendum)
Pt urinated on floor/chair. Pt up in chair and working with PT. Pt cleaned and new gown placed on pt

## 2022-03-11 NOTE — ED Notes (Signed)
Speech pathology at bedside for swallow study

## 2022-03-11 NOTE — Assessment & Plan Note (Signed)
See above

## 2022-03-11 NOTE — Progress Notes (Signed)
*  PRELIMINARY RESULTS* Echocardiogram 2D Echocardiogram has been performed.  Wallie Char Donta Fuster 03/11/2022, 10:21 AM

## 2022-03-11 NOTE — Evaluation (Signed)
Physical Therapy Evaluation Patient Details Name: Christopher Murphy MRN: [id number] DOB: 12/27/1945 Today's Date: 03/11/2022  History of Present Illness  Pt is a 76 y/o M admitted on 03/10/22 after presenting with c/o an episode of unresponsiveness & drooling. On EMS arrival pt was found to be hypotensive but responded to fluid bolus. MRI reveals "Suspect new areas of acute/subacute ischemia in the right parietal & left frontal lobes." PMH: prior CVA with R sided weakness, moderate vascular dementia  Clinical Impression  Pt seen for PT evaluation with OT present beginning mid session for co-tx. Pt's daughter in law Ivin Booty) present, preferring to interpret for pt. Prior to admission pt was ambulatory with SPC, 1 fall in the past 6 months, and only required assistance for bathing. On this date, pt is only oriented to himself, not able to recognize his daughter in law, which is atypical. Pt requires min assist for bed mobility, STS & step pivot transfers, & min<>mod assist for gait with RW. Pt demonstrates L lateral lean in standing with poor ability to correct despite cuing. Pt also with urinary incontinence, which is not baseline. Ivin Booty reports family already provides 24 hr supervision & is eager to take pt home vs facility. Anticipate pt can d/c home with HHPT f/u, no DME needs as they already have RW, w/c, BSC.    Recommendations for follow up therapy are one component of a multi-disciplinary discharge planning process, led by the attending physician.  Recommendations may be updated based on patient status, additional functional criteria and insurance authorization.  Follow Up Recommendations Home health PT      Assistance Recommended at Discharge Frequent or constant Supervision/Assistance  Patient can return home with the following  A little help with walking and/or transfers;A little help with bathing/dressing/bathroom;Assistance with cooking/housework;Assist for transportation;Help with  stairs or ramp for entrance;Direct supervision/assist for medications management    Equipment Recommendations None recommended by PT  Recommendations for Other Services       Functional Status Assessment Patient has had a recent decline in their functional status and demonstrates the ability to make significant improvements in function in a reasonable and predictable amount of time.     Precautions / Restrictions Precautions Precautions: Fall Restrictions Weight Bearing Restrictions: No      Mobility  Bed Mobility Overal bed mobility: Needs Assistance Bed Mobility: Supine to Sit     Supine to sit: Min assist, HOB elevated     General bed mobility comments: HOB elevated, extra time    Transfers Overall transfer level: Needs assistance Equipment used: 2 person hand held assist, Rolling walker (2 wheels) Transfers: Sit to/from Stand, Bed to chair/wheelchair/BSC Sit to Stand: Min assist, +2 physical assistance (STS with BUE HHA, later RW)   Step pivot transfers: Min assist, +2 physical assistance (Step pivot with BUE HHA min assist +2)            Ambulation/Gait Ambulation/Gait assistance: Min assist Gait Distance (Feet): 5 Feet Assistive device: Rolling walker (2 wheels) Gait Pattern/deviations: Decreased step length - right, Decreased step length - left, Decreased dorsiflexion - right, Decreased dorsiflexion - left, Decreased stride length, Decreased weight shift to left Gait velocity: decreased     General Gait Details: L lateral lean with poor ability to correct, cuing to continue walking. Decreased weight shift to RLE, decreased step length BLE.  Stairs            Wheelchair Mobility    Modified Rankin (Stroke Patients Only)  Balance Overall balance assessment: Needs assistance Sitting-balance support: Feet supported Sitting balance-Leahy Scale: Fair Sitting balance - Comments: close supervision static sitting Postural control: Left lateral  lean Standing balance support: During functional activity, Bilateral upper extremity supported, Reliant on assistive device for balance Standing balance-Leahy Scale: Poor Standing balance comment: L lateral lean, poor ability to correct despite cuing                             Pertinent Vitals/Pain Pain Assessment Pain Assessment: No/denies pain    Home Living Family/patient expects to be discharged to:: Private residence Living Arrangements: Children Available Help at Discharge: Family;Available 24 hours/day Type of Home: House Home Access: Ramped entrance       Home Layout: One level Home Equipment: Cane - single Barista (2 wheels);BSC/3in1      Prior Function Prior Level of Function : Needs assist             Mobility Comments: Pt was ambulatory with SPC, 1 fall in the past 6 months. ADLs Comments: Family assists with bathing. Pt could toilet & dress on his own.     Hand Dominance   Dominant Hand: Right    Extremity/Trunk Assessment   Upper Extremity Assessment Upper Extremity Assessment: Generalized weakness;Difficult to assess due to impaired cognition    Lower Extremity Assessment Lower Extremity Assessment: Generalized weakness;Difficult to assess due to impaired cognition (difficulty flexing RLE up enough to don sock while sitting in bed)       Communication   Communication:  (Pt speaks [place], daughter in law Ivin Booty) prefers to interpret. Pt with hx of aphasia. Daughter reports pt talks nonsensical at times.)  Cognition Arousal/Alertness: Awake/alert Behavior During Therapy: WFL for tasks assessed/performed Overall Cognitive Status: Impaired/Different from baseline                                 General Comments: Pt with hx of dementia, but today he is only oriented to himself. Pt states his name with extra time but does not recognize daughter-in-law in room, not oriented to location or situation. Follows  simple commands with extra time, occasional assistance to initiate functional tasks.        General Comments General comments (skin integrity, edema, etc.): Pt with incontinent void, requires total assist for changing into a clean gown. Daughter in law reports this is not baseline.    Exercises     Assessment/Plan    PT Assessment Patient needs continued PT services  PT Problem List Decreased strength;Decreased range of motion;Decreased activity tolerance;Decreased balance;Decreased mobility;Decreased safety awareness;Decreased knowledge of use of DME;Decreased cognition       PT Treatment Interventions Therapeutic exercise;Balance training;Gait training;DME instruction;Stair training;Functional mobility training;Therapeutic activities;Patient/family education;Neuromuscular re-education    PT Goals (Current goals can be found in the Care Plan section)  Acute Rehab PT Goals Patient Stated Goal: get better, go home PT Goal Formulation: With family Time For Goal Achievement: 03/25/22 Potential to Achieve Goals: Good    Frequency 7X/week     Co-evaluation PT/OT/SLP Co-Evaluation/Treatment: Yes Reason for Co-Treatment: Necessary to address cognition/behavior during functional activity;Complexity of the patient's impairments (multi-system involvement);For patient/therapist safety PT goals addressed during session: Mobility/safety with mobility;Balance;Proper use of DME         AM-PAC PT "6 Clicks" Mobility  Outcome Measure Help needed turning from your back to your side while in a flat  bed without using bedrails?: A Little Help needed moving from lying on your back to sitting on the side of a flat bed without using bedrails?: A Little Help needed moving to and from a bed to a chair (including a wheelchair)?: A Little Help needed standing up from a chair using your arms (e.g., wheelchair or bedside chair)?: A Little Help needed to walk in hospital room?: A Little Help needed  climbing 3-5 steps with a railing? : A Lot 6 Click Score: 17    End of Session   Activity Tolerance: Patient tolerated treatment well Patient left: in chair;with call bell/phone within reach (in care of OT & daughter in law) Nurse Communication: Mobility status PT Visit Diagnosis: Unsteadiness on feet (R26.81);Muscle weakness (generalized) (M62.81);Other abnormalities of gait and mobility (R26.89);Difficulty in walking, not elsewhere classified (R26.2)    Time: 3235-5732 PT Time Calculation (min) (ACUTE ONLY): 30 min   Charges:   PT Evaluation $PT Eval Moderate Complexity: 1 Mod PT Treatments $Therapeutic Activity: 8-22 mins        Lavone Nian, PT, DPT 03/11/22, 1:17 PM  Waunita Schooner 03/11/2022, 1:14 PM

## 2022-03-11 NOTE — Evaluation (Signed)
Clinical/Bedside Swallow Evaluation Patient Details  Name: Christopher Murphy MRN: [id number] Date of Birth: [birth year]  Today's Date: 03/11/2022 Time: SLP Start Time (ACUTE ONLY): 1000 SLP Stop Time (ACUTE ONLY): 1020 SLP Time Calculation (min) (ACUTE ONLY): 20 min  Past Medical History:  Past Medical History:  Diagnosis Date   Blood clots in brain 2009   Stroke [facility]    Past Surgical History:  Past Surgical History:  Procedure Laterality Date   COLONOSCOPY WITH PROPOFOL N/A 04/30/2019   Procedure: COLONOSCOPY WITH PROPOFOL;  Surgeon: Jonathon Bellows, MD;  Location: [facility] ENDOSCOPY;  Service: Gastroenterology;  Laterality: N/A;   PROSTATECTOMY  1980   HPI:  Per H&P "Christopher Murphy is a 76 y.o. 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  Most of the history is given at the bedside by his son who states that he has been confused for the past 2 days, not acting himself until the event tonight.     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  His dementia medication was increased and follow-up was given for 1 year.  EEG done in June showed no seizure activity.  He also had a carotid study in July that showed no significant stenosis.  On arrival of EMS, BP was 86/60, fluid responsive to SBP of 130 following 500 mL bolus of NS.  ED course and data review: By arrival to the ED, patient was awake and alert, vitals were within normal limits.  Labs including  CBC, CMP, troponin, EtOH, urinalysis were all unremarkable except for hemoglobin of 12.3.EKG, personally viewed and interpreted showed NSR at 68 with no ischemic ST-T wave changes.  CT head nonacute, showing the following:  IMPRESSION:  1. No acute intracranial abnormality.  2. Stable non contrast CT appearance of the brain since last year  with advanced intracranial artery dolichoectasia, advanced white  matter disease, chronic left PCA infarct." MRI "1. Suspect new areas of acute/subacute ischemia in the right  parietal and left frontal lobes. No hemorrhage or mass effect.  2. Severe chronic ischemic changes of the subcortical and deep white  matter. Advanced volume loss."    Assessment / Plan / Recommendation  Clinical Impression  Pt alert and pleasant. HOH. Daughter-in-law present. Offered interpreter and daughter-in-law declined stating she would prefer to interpret for patient.   Per daughter-in-law, pt consumed a regular consistency (vegetarian) diet with thin liquids which pt fed self PTA.   Pt with inconsistent ability to follow commands despite direct clinician modeling during oral motor examination. Noted missing teeth and suspect R lingual weakness based on pattern of lingual residual with solid and pt's articulatory imprecision.   Pt given trials of regular, pureed, and thin liquids (via cup and straw; also, with pills x1 at a time as administered by RN). Pt presents with s/sx mild oral dysphagia c/b mildly prolonged mastication and mild lingual residual on R lingual body with solids. Oral clearance achieved with liquid wash. Oral deficits likely secondary to missing dentition and  lingual weakness. Oral deficits likely exacerbated by mental status.   Recommend continuation of a regular consistency diet with thin liquids and safe swallowing strategies/aspiration precautions as outlined below.   Pt and daughter-in-law made aware of results, recommendations, and SLP POC. Daughter-in-law  verbalized understanding/agreement. RN also made aware.   SLP proceeded with informal aphasia evaluation immediately after clinical swallowing evaluation.    SLP Visit Diagnosis: Dysphagia, oral phase (R13.11)    Aspiration Risk  Mild aspiration risk    Diet Recommendation Regular;Thin liquid   Medication Administration: Whole meds with liquid Supervision: Staff to assist with self feeding;Full supervision/cueing for compensatory strategies (allow pt to self feed as much as possible) Compensations: Minimize environmental distractions;Slow rate;Small sips/bites;Follow solids with liquid Postural Changes: Seated upright at 90 degrees;Remain upright for at least 30 minutes after po intake    Other  Recommendations Oral Care Recommendations: Oral care QID;Staff/trained caregiver to provide oral care    Recommendations for follow up therapy are one component of a multi-disciplinary discharge planning process, led by the attending physician.  Recommendations may be updated based on patient status, additional functional criteria and insurance authorization.  Follow up Recommendations Skilled nursing-short term rehab (<3 hours/day)      Assistance Recommended at Discharge    Functional Status Assessment Patient has had a recent decline in their functional status and demonstrates the ability to make significant improvements in function in a reasonable and predictable amount of time.  Frequency and Duration            Prognosis Prognosis for Safe Diet Advancement: Fair Barriers to Reach Goals: Cognitive deficits;Language deficits;Severity of deficits;Behavior      Swallow Study   General Date of Onset: 03/10/22 HPI: Per H&P "Christopher Murphy is a 76 y.o. male with medical history significant for Prior CVA with right-sided weakness, moderate vascular dementia followed by neurology, last seen 5/3 when last seen is described as continuing to have "occasional episodes of intermittent  babbling speech and speech difficulties with the last episode being 2 months ago (March 2023)" who presents to the ED by EMS with an episode of unresponsiveness and drooling that happened while sitting at the dinner table.  Most of the history is given at the bedside by his son who states that he has been confused for the past 2 days, not acting himself until the event tonight.     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  His dementia medication was increased and follow-up was given for 1 year.  EEG done in June showed no seizure activity.  He also had a carotid study in July that showed no significant stenosis.  On arrival of EMS, BP was 86/60, fluid responsive to SBP of 130 following 500 mL bolus of NS.  ED course and data review: By arrival to the ED, patient was awake and alert, vitals were within normal limits.  Labs including CBC, CMP, troponin, EtOH, urinalysis were all unremarkable except for hemoglobin of 12.3.EKG, personally viewed and interpreted showed NSR at 68 with no ischemic ST-T wave changes.  CT head nonacute, showing the following:  IMPRESSION:  1. No acute intracranial abnormality.  2. Stable non contrast CT appearance of the brain since last year  with advanced intracranial artery dolichoectasia, advanced white  matter disease, chronic left PCA infarct." MRI "1. Suspect new areas of acute/subacute  ischemia in the right  parietal and left frontal lobes. No hemorrhage or mass effect.  2. Severe chronic ischemic changes of the subcortical and deep white  matter. Advanced volume loss." Type of Study: Bedside Swallow Evaluation Diet Prior to this Study: Regular;Thin liquids Temperature Spikes Noted: No Respiratory Status: Room air History of Recent Intubation: No Behavior/Cognition: Alert;Confused;Requires cueing;Doesn't follow directions Oral  Cavity Assessment: Within Functional Limits Oral Care Completed by SLP: Recent completion by staff Oral Cavity - Dentition: Missing dentition Vision: Functional for self-feeding Self-Feeding Abilities: Needs assist;Needs set up;Total assist (inconsistent level of assistance) Patient Positioning: Upright in bed Baseline Vocal Quality: Normal Volitional Cough: Strong Volitional Swallow: Able to elicit    Oral/Motor/Sensory Function Overall Oral Motor/Sensory Function: Mild impairment Lingual ROM: Reduced right;Reduced left (likely due to reduced ability to follow commands; suspect R lingual weakness based on post-swallow oral residual)   Ice Chips Ice chips: Not tested   Thin Liquid Thin Liquid: Within functional limits Presentation: Straw Other Comments: ~5 oz; via single sips; also with pills administered by RN    Nectar Thick Nectar Thick Liquid: Not tested   Honey Thick Honey Thick Liquid: Not tested   Puree Puree: Within functional limits Presentation: Spoon Other Comments: ~2 oz   Solid     Solid: Impaired Oral Phase Impairments: Impaired mastication;Poor awareness of bolus Oral Phase Functional Implications: Oral residue      Quintella Baton 03/11/2022,12:32 PM

## 2022-03-11 NOTE — Hospital Course (Addendum)
Taken from H&P.  : Christopher Murphy is a 76 y.o. 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  Most of the history is given at the bedside by his son who states that he has been confused for the past 2 days, not acting himself until the event tonight.   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  His dementia medication was increased and follow-up was given for 1 year.  EEG done in June showed no seizure activity.  He also had a carotid study in July that showed no significant stenosis.    ED course and data reviewed.  On arrival to ED he was having softer blood pressure at 86/60 which responded well to 500 mL of bolus.Labs including CBC, CMP, troponin, EtOH, urinalysis were all unremarkable except for hemoglobin of 12.3.UA positive with urine dipstick with no elevated RBCs.  No other abnormality. EKG showed NSR at 68 with no ischemic ST-T wave changes.  CT head was negative for any acute intracranial abnormality, did show stable on current CT appearance of brain since last year with advanced intracranial artery dolichoectasia, advanced white matter disease, chronic left PCA infarct.  12/3: Blood pressure mildly elevated at 165/80.  COVID-19 PCR negative.  D-dimer mildly positive at 0.65 which also seems chronic.  Troponin negative. MRI brain with suspected new areas of acute/subacute ischemia in the right parietal and left frontal lobes, concern of watershed  area.  No hemorrhage or mass effect. Also noted to have severe chronic ischemic changes of subcortical and deep white matter with advanced volume loss.  CTA head and neck with no emergent findings.  Chronic left PCA occlusion and limited atherosclerotic changes in neck. Neurology was also consulted and they are recommending completing stroke workup and an EEG tomorrow morning.  No need to start seizure medications at this time. Patient continues to have significant conversational deficit, per DIL, this is worse than his baseline.  PT and OT are recommending home health.

## 2022-03-11 NOTE — ED Notes (Addendum)
Provider notified at this time "Daughter in law, is at bedside. She does work [employer]. I did notice the right pupil seems to be an abnormal shape, but she was unable to tell me if that is baseline, because he has had a stroke with right sided deficits. He seems to be minimally coherent, and I dont know if I will be able to give/I dont think it is safe to give oral medications in his current state."

## 2022-03-11 NOTE — Evaluation (Signed)
Occupational Therapy Evaluation Patient Details Name: Christopher Murphy MRN: [id number] DOB: [birth year] Today's Date: 03/11/2022   History of Present Illness Pt is a 76 y/o M admitted on 03/10/22 after presenting with c/o an episode of unresponsiveness & drooling. On EMS arrival pt was found to be hypotensive but responded to fluid bolus. MRI reveals "Suspect new areas of acute/subacute ischemia in the right parietal & left frontal lobes." PMH: prior CVA with R sided weakness, moderate vascular dementia   Clinical Impression   Chart reviewed, pt greeted in room with daughter in law present who assists with translation. Co tx completed with PT on this date. PTA pt had 24/7 supervision, was able to amb in home with walking stick, perform dressing, grooming with supervision-MOD I, assist for bathing and IADLs. Pt presents with deficits in strength, endurance, activity tolerance, balance,cognition all affecting safe and optimal ADL completion. Pt DIL reports cognition is not at baseline as evidenced by pt with incontinent void (usually takes himself to that bathroom) and he does not recognize her. Dicussed discharge recommendations, DME use, recommend discharge home with [facility]. Use of mwc for functional transfers/MRADLs if needed. OT will continue to follow acutely.      Recommendations for follow up therapy are one component of a multi-disciplinary discharge planning process, led by the attending physician.  Recommendations may be updated based on patient status, additional functional criteria and insurance authorization.   Follow Up Recommendations  Home health OT (if pt does not have assist for ADL, STR may be warrented; Daugther in law preference is to return home with [facility])     Assistance Recommended at Discharge Frequent or constant Supervision/Assistance  Patient can return home with the following A lot of help with walking and/or transfers;A lot of help with bathing/dressing/bathroom     Functional Status Assessment  Patient has had a recent decline in their functional status and demonstrates the ability to make significant improvements in function in a reasonable and predictable amount of time.  Equipment Recommendations  Other (comment) (pt has recommended equipment)    Recommendations for Other Services       Precautions / Restrictions Precautions Precautions: Fall Restrictions Weight Bearing Restrictions: No      Mobility Bed Mobility               General bed mobility comments: NT in recliner at start of OT evaluation/ end of session    Transfers Overall transfer level: Needs assistance Equipment used: Rolling walker (2 wheels) Transfers: Sit to/from Stand, Bed to chair/wheelchair/BSC Sit to Stand: Min assist, +2 physical assistance           General transfer comment: amb in room approx 8' with MIN A, close chair follow, L lateral lean      Balance Overall balance assessment: Needs assistance   Sitting balance-Leahy Scale: Fair   Postural control: Left lateral lean Standing balance support: During functional activity, Bilateral upper extremity supported, Reliant on assistive device for balance Standing balance-Leahy Scale: Poor                             ADL either performed or assessed with clinical judgement   ADL Overall ADL's : Needs assistance/impaired                                       General ADL Comments: MAX  A for LB bathing, MOD A for UB dressing, MAX A for LB dressing,, MOD A for grooming with step by step vcs     Vision         Perception     Praxis      Pertinent Vitals/Pain Pain Assessment Pain Assessment: No/denies pain     Hand Dominance Right   Extremity/Trunk Assessment Upper Extremity Assessment Upper Extremity Assessment: Generalized weakness;Difficult to assess due to impaired cognition   Lower Extremity Assessment Lower Extremity Assessment: Generalized  weakness;Difficult to assess due to impaired cognition       Communication Communication Communication: Expressive difficulties;Prefers language other than English (DIL prefers to translate)   Cognition Arousal/Alertness: Awake/alert Behavior During Therapy: Flat affect Overall Cognitive Status: Impaired/Different from baseline Area of Impairment: Orientation, Attention, Memory, Following commands, Safety/judgement, Awareness, Problem solving                 Orientation Level: Disoriented to, Place, Time, Situation Current Attention Level: Focused Memory: Decreased short-term memory Following Commands: Follows one step commands with increased time (multi modal cueing) Safety/Judgement: Decreased awareness of safety, Decreased awareness of deficits Awareness: Intellectual Problem Solving: Slow processing, Decreased initiation, Difficulty sequencing, Requires verbal cues, Requires tactile cues General Comments: pt daughter in law reports cognition is not at baseline- he does not recognize her     General Comments  L lateral lean throughout all mobility    Exercises Other Exercises Other Exercises: edu pt and daugther in law re: role of rehab, discharge recommendations, home DME recommendations   Shoulder Instructions      Home Living Family/patient expects to be discharged to:: Private residence Living Arrangements: Children Available Help at Discharge: Family;Available 24 hours/day Type of Home: House Home Access: Ramped entrance     Home Layout: One level     Bathroom Shower/Tub: Walk-in shower         Home Equipment: Cane - single Barista (2 wheels);BSC/3in1;Wheelchair - manual   Additional Comments: pt DIL provides home set up information, reporst 24/7 supervision  Lives With: Family    Prior Functioning/Environment Prior Level of Function : Needs assist             Mobility Comments: amb with walking stick/spc with supervision ADLs  Comments: perfomed toileting, dressing with supervison-MOD I, family assisted with bathing; assist for IADL        OT Problem List: Decreased strength;Decreased activity tolerance;Impaired balance (sitting and/or standing);Decreased safety awareness;Decreased cognition;Decreased knowledge of use of DME or AE      OT Treatment/Interventions: Self-care/ADL training;Patient/family education;Therapeutic exercise;Balance training;Therapeutic activities;DME and/or AE instruction    OT Goals(Current goals can be found in the care plan section) Acute Rehab OT Goals Patient Stated Goal: home health therapy OT Goal Formulation: With family Time For Goal Achievement: 03/25/22 Potential to Achieve Goals: Good ADL Goals Pt Will Perform Grooming: with min assist Pt Will Perform Lower Body Dressing: with min assist Pt Will Transfer to Toilet: with min assist Pt Will Perform Toileting - Clothing Manipulation and hygiene: with min assist  OT Frequency: Min 2X/week    Co-evaluation PT/OT/SLP Co-Evaluation/Treatment: Yes Reason for Co-Treatment: Necessary to address cognition/behavior during functional activity;To address functional/ADL transfers PT goals addressed during session: Mobility/safety with mobility;Balance;Proper use of DME OT goals addressed during session: ADL's and self-care      AM-PAC OT "6 Clicks" Daily Activity     Outcome Measure Help from another person eating meals?: A Lot Help from another person taking care of  personal grooming?: A Lot Help from another person toileting, which includes using toliet, bedpan, or urinal?: A Lot Help from another person bathing (including washing, rinsing, drying)?: A Lot Help from another person to put on and taking off regular upper body clothing?: A Lot Help from another person to put on and taking off regular lower body clothing?: A Lot 6 Click Score: 12   End of Session Equipment Utilized During Treatment: Rolling walker (2 wheels) Nurse  Communication: Mobility status  Activity Tolerance: Patient tolerated treatment well Patient left: in chair;with call bell/phone within reach;with nursing/sitter in room;with family/visitor present  OT Visit Diagnosis: Unsteadiness on feet (R26.81);Muscle weakness (generalized) (M62.81)                Time: 0340-3524 OT Time Calculation (min): 17 min Charges:  OT General Charges $OT Visit: 1 Visit OT Evaluation $OT Eval Moderate Complexity: 1 Mod  Shanon Payor, OTD OTR/L  03/11/22, 1:59 PM

## 2022-03-11 NOTE — ED Notes (Addendum)
As per son, yesterday after lunch, pt stopped responding for several minutes, but was still breathing. Son states pt has a stroke history with deficits on the right side. As per son, since yesterday, pt has had some moments of clarity and following commands, but at other times he will not.

## 2022-03-11 NOTE — ED Notes (Signed)
RN to bedside to introduce self to pt. Pt in no acute distress. Family at bedside.

## 2022-03-11 NOTE — Progress Notes (Signed)
Progress Note   Patient: Christopher Murphy ELF:[id number] DOB: 26-Nov-1945 DOA: 03/10/2022     0 DOS: the patient was seen and examined on 03/11/2022   Brief hospital course: Taken from H&P.  : Christopher Murphy is a 76 y.o. 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  Most of the history is given at the bedside by his son who states that he has been confused for the past 2 days, not acting himself until the event tonight.   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  His dementia medication was increased and follow-up was given for 1 year.  EEG done in June showed no seizure activity.  He also had a carotid study in July that showed no significant stenosis.    ED course and data reviewed.  On arrival to ED he was having softer blood pressure at 86/60 which responded well to 500 mL of bolus.Labs including CBC, CMP, troponin, EtOH, urinalysis were all unremarkable except for hemoglobin of 12.3.UA positive with urine dipstick with no elevated RBCs.  No other abnormality. EKG showed NSR at 68 with no ischemic ST-T wave changes.  CT head was negative for any acute intracranial abnormality, did show stable on current CT appearance of brain since last year with advanced intracranial artery dolichoectasia, advanced white matter disease, chronic left PCA infarct.  12/3: Blood pressure mildly elevated at 165/80.  COVID-19 PCR negative.  D-dimer mildly positive at  0.65 which also seems chronic.  Troponin negative. MRI brain with suspected new areas of acute/subacute ischemia in the right parietal and left frontal lobes, concern of watershed area.  No hemorrhage or mass effect. Also noted to have severe chronic ischemic changes of subcortical and deep white matter with advanced volume loss.  CTA head and neck with no emergent findings.  Chronic left PCA occlusion and limited atherosclerotic changes in neck. Neurology was also consulted and they are recommending completing stroke workup and an EEG tomorrow morning.  No need to start seizure medications at this time. Patient continues to have significant conversational deficit, per DIL, this is worse than his baseline.  PT and OT are recommending home health.    Assessment and Plan: * Acute metabolic encephalopathy Syncope/episode of unresponsiveness Vascular dementia Patient reportedly was confused for the past 2 days and then had an episode of unresponsiveness Etiology uncertain and differential includes orthostatic hypotension, TIA/CVA,  Symptomatic hypotension with delirium in view of underlying dementia Low suspicion for infectious etiology Low suspicion for seizure as patient had negative EEG in June We will get stroke and syncope work-up and repeat EEG (telemetry, echo and MRI, had negative Doppler in July so will not repeat) Ordered D-dimer-mildly elevated at 0.65 which also seems chronic. MRI concern of acute/subacute infarct patient has very significant underlying white matter disease which will make it difficult to have definitive assessment. Neurology is recommending completion of stroke workup and EEG tomorrow. 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 Blood pressure currently elevated.  Vascular dementia ([place]) Continue donepezil and Namenda Delirium precautions  Hemiparesis affecting right side as late effect of cerebrovascular accident (CVA) ([place]) Continue aspirin and rosuvastatin.  Was previously on Plavix but this was discontinued by his neurologist Supportive care, assistance with transfers   Subjective: Patient was seen and examined today.  A Punjabi speaking , hard of hearing gentleman, just answering very simple questions.  Having some headache.  Physical Exam: Vitals:   03/11/22 0530 03/11/22 0600 03/11/22 0630 03/11/22 1101  BP: (!) 147/67 (!) 171/93 (!) 165/80 (!) 164/78  Pulse: (!) 59 65 66 60  Resp:  '16 16 14  '$ Temp:    97.6 F (36.4 C)  TempSrc:    Oral  SpO2: 91% 97% 97% 97%  Weight:      Height:       General.  Frail and malnourished elderly man, in no acute distress. Pulmonary.  Lungs clear bilaterally, normal respiratory effort. CV.  Regular rate and rhythm, no JVD, rub or murmur. Abdomen.  Soft, nontender, nondistended, BS positive. CNS.  Alert and oriented to self.  No focal neurologic deficit. Extremities.  No edema, no cyanosis, pulses intact and symmetrical. Psychiatry.  Judgment and insight appears impaired  Data Reviewed: Prior data reviewed  Family Communication: Discussed with daughter-in-law at bedside  Disposition: Status is: Inpatient Remains inpatient appropriate because: Severity of illness  Planned Discharge Destination: Home with Home Health  DVT prophylaxis.  Lovenox Time spent: 50 minutes  This record has been created using Systems analyst. Errors have been sought and corrected,but may not always be located. Such creation errors do not reflect on the standard of care.   Author: Lorella Nimrod, MD 03/11/2022 2:11 PM  For on call review [email or web address].

## 2022-03-11 NOTE — Consult Note (Signed)
Neurology Consultation  Reason for Consult: Altered mental status, concern for new stroke Referring Physician: Dr. Latina Craver  CC: Altered mental status, concern for new stroke  History is obtained from: Chart patient's daughter-in-law at bedside  HPI: Christopher Murphy is a 76 y.o. 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 Patient's daughter-in-law who is a primary care nurse practitioner in our system, reports that he was sitting at the dinner table finished his meal and then started drooling from 1 side of the face and became unresponsive.  She said that he had been feeling somewhat generally sick and confused over the past couple of days prior to this presentation but they did not notice any significant focal deficits or any witnessed seizure activity.  He has had episodes of decreased responsiveness, sometimes with hypotension or with infections but none of them have been characterized truly has seizures although there has been a suspicion of underlying seizures. Overnight, he was uncomfortable and probably agitated and was given some sedating medication and was very drowsy on this exam. He only speaks [place] and his daughter-in-law was able to translate.  I was able to also communicate with him in [place] myself.  He moved to the [place] in 2017.  He used to live in [place] prior to that.  His son and daughter-in-law live in [place].  He had a large stroke in 2009 and did not receive any acute treatment for it.  He has  had multiple TIAs after that according to the daughter-in-law but no big acute stroke after that.   LKW: At least 2 to 3 days ago IV thrombolysis given?: no, nonfocal exam and outside the window Modified [place] is at least a 4   ROS: Full ROS was performed and is negative except as noted in the HPI.   Past Medical History:  Diagnosis Date   Blood clots in brain 2009   Stroke [facility])      Family History  Problem Relation Age of Onset   AAA (abdominal aortic aneurysm) Neg Hx      Social History:   reports that he has never smoked. He has never used smokeless tobacco. He reports that he does not drink alcohol and does not use drugs.  Medications  Current Facility-Administered Medications:     stroke: early stages of recovery book, , Does not apply, Once, Athena Masse, MD   acetaminophen (TYLENOL) tablet 650 mg, 650 mg, Oral, Q4H PRN **OR** acetaminophen (TYLENOL) suppository 650 mg, 650 mg, Rectal, Q4H PRN, Athena Masse, MD   acetaminophen (TYLENOL) tablet 650 mg, 650 mg, Oral, Q6H PRN **OR** acetaminophen (TYLENOL) suppository 650 mg, 650 mg, Rectal, Q6H PRN, Athena Masse, MD   aspirin EC tablet 81 mg, 81 mg, Oral, Daily, Judd Gaudier V, MD, 81 mg at 03/10/22 2256   donepezil (ARICEPT) tablet 10 mg, 10 mg, Oral, QHS, Judd Gaudier V, MD, 10 mg at 03/10/22 2256   enoxaparin (LOVENOX) injection 40 mg, 40 mg, Subcutaneous, Q24H, Judd Gaudier V, MD, 40 mg at 03/10/22 2257   LORazepam (ATIVAN) injection 0.5 mg,  0.5 mg, Intravenous, Q4H PRN, Athena Masse, MD   LORazepam (ATIVAN) injection 2 mg, 2 mg, Intravenous, Q5 Min x 2 PRN, Athena Masse, MD, 2 mg at 03/11/22 0001   memantine (NAMENDA) tablet 10 mg, 10 mg, Oral, BID, Athena Masse, MD, 10 mg at 03/10/22 2256   ondansetron (ZOFRAN) tablet 4 mg, 4 mg, Oral, Q6H PRN **OR** ondansetron (ZOFRAN) injection 4 mg, 4 mg, Intravenous, Q6H PRN, Athena Masse, MD   Oral care mouth rinse, 15 mL, Mouth Rinse, Q2H, Athena Masse, MD, 15 mL at 03/11/22 0865   Oral care mouth rinse, 15 mL, Mouth Rinse, PRN, Athena Masse, MD   pantoprazole (PROTONIX) EC tablet 40 mg, 40 mg, Oral, Daily, Judd Gaudier V, MD, 40 mg at 03/10/22 2256   rosuvastatin (CRESTOR) tablet 20 mg, 20 mg, Oral, Daily, Judd Gaudier V, MD   sodium chloride flush (NS) 0.9 % injection 3 mL, 3 mL, Intravenous, Q12H, Athena Masse, MD, 3 mL at 03/10/22 2258  Current Outpatient Medications:    ASPIRIN LOW DOSE 81 MG EC tablet, TAKE 1 TABLET BY MOUTH EVERY DAY, Disp: 90 tablet, Rfl: 1   donepezil (ARICEPT) 10 MG tablet, TAKE 1 TABLET BY MOUTH EVERYDAY AT BEDTIME, Disp: 90 tablet, Rfl: 1   memantine (NAMENDA) 10 MG tablet, TAKE 1 TABLET BY MOUTH TWICE A DAY, Disp: 180 tablet, Rfl: 4   pantoprazole (PROTONIX) 40 MG tablet, TAKE 1 TABLET BY MOUTH EVERY DAY, Disp: 90 tablet, Rfl: 1   Polyethyl Glycol-Propyl Glycol (SYSTANE) 0.4-0.3 % SOLN, Apply 1 drop to eye 2 (two) times daily. (Patient taking differently: Place 1 drop into both eyes 2 (two) times daily.), Disp: 30 mL, Rfl: 5   polyethylene glycol (MIRALAX / GLYCOLAX) 17 g packet, Take 17 g by mouth daily., Disp: 14 each, Rfl: 0   rosuvastatin (CRESTOR) 20 MG tablet, TAKE 1 TABLET BY MOUTH EVERY DAY, Disp: 90 tablet, Rfl: 0   albuterol (VENTOLIN HFA) 108 (90 Base) MCG/ACT inhaler, Inhale 2 puffs into the lungs every 6 (six) hours as needed for wheezing or shortness of breath., Disp: 8 g, Rfl: 0   camphor-menthol (SARNA) lotion, Apply 1 application topically as needed for itching., Disp: 222 mL, Rfl: 2   cetirizine (ZYRTEC) 10 MG tablet, Take 1 tablet (10 mg total) by mouth daily. (Patient taking differently: Take 10 mg by mouth daily as needed for allergies or rhinitis.), Disp: 30 tablet, Rfl: 11   doxycycline (VIBRA-TABS) 100 MG tablet, Take 1 tablet (100 mg total) by mouth 2 (two) times daily. (Patient not taking: Reported on 03/10/2022), Disp: 20 tablet, Rfl: 0   triamcinolone cream (KENALOG) 0.1 %,  APPLY TO AFFECTED AREA TWICE A DAY AS NEEDED FOR ITCHING (Patient not taking: Reported on 03/10/2022), Disp: 60 g, Rfl: 0   Exam: Current vital signs: BP (!) 165/80   Pulse 66   Temp 98.9 F (37.2 C) (Oral)   Resp 16   Ht '5\' 8"'$  (1.727 m)   Wt 63.5 kg   SpO2 97%   BMI 21.29 kg/m  Vital signs in last 24 hours: Temp:  [98.9 F (37.2 C)] 98.9 F (37.2 C) (12/02 1440) Pulse Rate:  [59-88] 66 (12/03 0630) Resp:  [13-31] 16 (12/03 0630) BP: (119-171)/(67-97) 165/80 (12/03 0630) SpO2:  [91 %-100 %] 97 % (12/03 0630) Weight:  [63.5 kg] 63.5 kg (12/02 1440) General: Drowsy but opens eyes to voice.  Follows some simple commands HEENT: Normocephalic atraumatic CVS:  Regular rate rhythm Abdomen nondistended nontender Neurological exam Very drowsy, opens eyes to voice Once awake, he was able to respond to my greeting and Pun[place]. When I asked him his name, he was not able to tell me his name but when I showed him a pen and asked him to name it, he was able to tell me it is a pen. His speech is moderately dysarthric. When I showed him couple of other simple objects, he could not name them. He was also not able to name his daughter-in-law but smiled at her as if he did know her but again when she asked repeatedly who she was and what her name was, he could not tell her that. Cranial nerves: Pupils appear equal round react light, extraocular movements intact, visual fields full to threat, face appears grossly symmetric. Motor examination with antigravity strength in all 4 extremities that she was able to follow commands to raise arms and legs upon command. Sensation intact to touch Coordination was difficult to assess due to him not be able to follow complex commands. No gross dysmetria noted upon seeing his reach for objects especially when I handed him my plan etc.  NIHSS 1a Level of Conscious.: 1 1b LOC Questions: 2 1c LOC Commands: 0 2 Best Gaze: 0 3 Visual: 0 4 Facial Palsy: 0 5a  Motor Arm - left: 0 5b Motor Arm - Right: 0 6a Motor Leg - Left: 0 6b Motor Leg - Right: 0 7 Limb Ataxia: 0 8 Sensory: 0 9 Best Language: 1 10 Dysarthria: 2 11 Extinct. and Inatten.: 0 TOTAL: 6   Labs I have reviewed labs in epic and the results pertinent to this consultation are: CBC    Component Value Date/Time   WBC 6.5 03/10/2022 1455   RBC 4.55 03/10/2022 1455   HGB 12.3 (L) 03/10/2022 1455   HCT 38.1 (L) 03/10/2022 1455   PLT 225 03/10/2022 1455   MCV 83.7 03/10/2022 1455   MCH 27.0 03/10/2022 1455   MCHC 32.3 03/10/2022 1455   RDW 14.3 03/10/2022 1455   LYMPHSABS 1.8 03/10/2022 1455   MONOABS 0.5 03/10/2022 1455   EOSABS 0.1 03/10/2022 1455   BASOSABS 0.0 03/10/2022 1455    CMP     Component Value Date/Time   NA 139 03/10/2022 1455   K 4.2 03/10/2022 1455   CL 108 03/10/2022 1455   CO2 28 03/10/2022 1455   GLUCOSE 100 (H) 03/10/2022 1455   BUN 12 03/10/2022 1455   CREATININE 0.94 03/10/2022 1455   CREATININE 0.89 03/10/2022 1455   CREATININE 0.89 01/23/2021 1042   CALCIUM 8.7 (L) 03/10/2022 1455   PROT 6.6 03/10/2022 1455   ALBUMIN 3.4 (L) 03/10/2022 1455   AST 18 03/10/2022 1455   ALT 15 03/10/2022 1455   ALKPHOS 51 03/10/2022 1455   BILITOT 0.6 03/10/2022 1455   GFRNONAA >60 03/10/2022 1455   GFRNONAA >60 03/10/2022 1455   GFRNONAA 83 02/21/2015 1008   GFRAA >60 10/28/2019 0905   GFRAA >89 02/21/2015 1008    Lipid Panel     Component Value Date/Time   CHOL 115 03/11/2022 0302   CHOL 107 08/27/2019 1544   TRIG 58 03/11/2022 0302   HDL 43 03/11/2022 0302   HDL 35 (L) 08/27/2019 1544   CHOLHDL 2.7 03/11/2022 0302   VLDL 12 03/11/2022 0302   LDLCALC 60 03/11/2022 0302   LDLCALC 75 01/23/2021 1042   LDLDIRECT 45.0 06/17/2017 1452     Imaging I have reviewed the  images obtained:  CT-head: Stable from the prior scan.  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 MRI  of the brain  MRI examination of the brain: Was read by the radiologist as suspecting new areas of acute/subacute ischemia in the right parietal and frontal lobes.  On my evaluation, he has so much extensive white matter disease and prior scans also showing a lot of DWI bright signal in the subcortical U fibers area that it is hard for me to ascertain whether there are any new areas of stroke or if this is progression of his prior infarcts.  Pattern of his white matter damage could reflect bilateral watershed infarctions.  Assessment: 76 year old man with extensive history of strokes in the past, brought in for evaluation of an episode of unresponsiveness after having been feeling unwell for the past 2 days.  Questions have been raised about whether he has underlying seizures in the past.  Workup has been unremarkable and he has not been on any antiepileptics. His imaging is concerning for questionable acute strokes in the areas of chronic strokes but it is hard to discern with so much subcortical and U fibers white matter damage that he has whether any of this is new and if that has any clinical relevance. I would not be surprised if he has more new areas of infarction but also recrudescence of old symptoms in the setting of underlying infection versus seizures.  Impression: Evaluate for new stroke versus recrudescence of old stroke symptoms Evaluate for underlying seizures  Recommendations: Stroke workup has been ordered by the hospitalist.  I will let that complete including 2D echo A1c lipid panel and cardiac monitoring.  Carotid Dopplers were done in June with no stenosis.  Head vessel imaging is from 2019 MRA head-chronic left PCA occlusion.  His head vessels of not being evaluated since.  I would recommend getting CT angiography head and neck. Continue aspirin and home statin Neurology will follow-up on those with you. I would also recommend getting a routine EEG in the morning tomorrow to  evaluate for any underlying electrographic abnormalities. For now, I would hold off on adding any antiepileptics. PT OT speech therapy Check for any underlying evidence of UTI or pneumonia-his respiratory skin is negative, chest x-ray unremarkable as well as UA negative for UTI. He had a mild increase in D-dimer-will defer management to the primary team. Discussed my plan with Dr. Reesa Chew Neurology will follow  -- Amie Portland, MD Neurologist [facility] Pager: [phone]

## 2022-03-11 NOTE — ED Notes (Signed)
Report given to Crystal, RN.

## 2022-03-12 ENCOUNTER — Other Ambulatory Visit: Payer: Self-pay

## 2022-03-12 ENCOUNTER — Encounter: Payer: Self-pay | Admitting: Internal Medicine

## 2022-03-12 DIAGNOSIS — G9341 Metabolic encephalopathy: Secondary | ICD-10-CM | POA: Diagnosis not present

## 2022-03-12 LAB — HEMOGLOBIN A1C
Hgb A1c MFr Bld: 5.6 % (ref 4.8–5.6)
Mean Plasma Glucose: 114 mg/dL

## 2022-03-12 LAB — GLUCOSE, CAPILLARY: Glucose-Capillary: 83 mg/dL (ref 70–99)

## 2022-03-12 MED ORDER — CLOPIDOGREL BISULFATE 75 MG PO TABS: 75.0000 mg | ORAL_TABLET | Freq: Every day | ORAL | 0 refills | Status: AC

## 2022-03-12 MED ORDER — ORAL CARE MOUTH RINSE: 15.0000 mL | Freq: Four times a day (QID) | OROMUCOSAL | Status: DC

## 2022-03-12 MED ORDER — CLOPIDOGREL BISULFATE 75 MG PO TABS
75.0000 mg | ORAL_TABLET | Freq: Every day | ORAL | Status: DC
  Administered 2022-03-12: 75 mg via ORAL
  Filled 2022-03-12: qty 1

## 2022-03-12 NOTE — Discharge Summary (Signed)
Physician Discharge Summary   Patient: Christopher Murphy MRN: [id number] DOB: [birth year]  Admit date:     03/10/2022  Discharge date: 03/12/22  Discharge Physician: Lorella Nimrod   PCP: [name], NP   Recommendations at discharge:  Please obtain CBC and BMP in 1 week Follow-up with primary care provider Patient is being discharged on DAPT with aspirin and Plavix for 3 weeks, followed by aspirin  Discharge Diagnoses: Principal Problem:   Acute metabolic encephalopathy Active Problems:   CVA (cerebrovascular accident) ([place])   Vascular dementia ([place])   Hypotension   Hemiparesis affecting right side as late effect of cerebrovascular accident (CVA) [facility])   Hospital Course: Taken from H&P.  : Christopher Murphy is a 76 y.o. 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  Most of the history is given at the bedside by his son who states that he has been confused for the past 2 days, not acting himself until the event tonight.   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  His dementia medication was increased and follow-up was given for 1 year.  EEG done in June showed no seizure activity.  He also had a carotid study in July that showed no significant stenosis.    ED course and data reviewed.  On arrival to ED he was having softer blood pressure at 86/60 which responded well to 500 mL of bolus.Labs including CBC, CMP, troponin, EtOH,  urinalysis were all unremarkable except for hemoglobin of 12.3.UA positive with urine dipstick with no elevated RBCs.  No other abnormality. EKG showed NSR at 68 with no ischemic ST-T wave changes.  CT head was negative for any acute intracranial abnormality, did show stable on current CT appearance of brain since last year with advanced intracranial artery dolichoectasia, advanced white matter disease, chronic left PCA infarct.  12/3: Blood pressure mildly elevated at 165/80.  COVID-19 PCR negative.  D-dimer mildly positive at 0.65 which also seems chronic, and low probability for PE if adjusted for age, troponin negative.  Lipid panel normal MRI brain with suspected Christopher areas of acute/subacute ischemia in the right parietal and left frontal lobes, concern of watershed area.  No hemorrhage or mass effect. Also noted to have severe chronic ischemic changes of subcortical and deep white matter with advanced volume loss.  CTA head and neck with no emergent findings.  Chronic left PCA occlusion and limited atherosclerotic changes in neck. Neurology was also consulted and they are recommending completing stroke workup and an EEG tomorrow morning.  No need to start seizure medications at this time. Patient continues to have significant conversational deficit, per DIL, this is worse than his baseline.  PT and OT are recommending home health.  12/4: Hemodynamically stable.  Echocardiogram with EF of 50 to 03%, grade 1 diastolic dysfunction and mild aortic stenosis.  No septal defect. Patient will take DAPT with aspirin and Plavix together for 3 weeks followed by aspirin only. EEG with generalized slowing and no seizure-like  activity. Patient clinically improved.  Home health services were ordered with PT, OT and speech therapy.  Might be syncopal episode as initial blood pressure was low, recurrent stroke cannot be completely ruled out at this time due to a lot of underlying brain abnormalities. If he  continues to have similar episodes then need to have a cardiac monitor.  Patient was advised to keep himself well-hydrated.  He will continue on current management and follow-up with his providers closely for further recommendations.  Assessment and Plan: * Acute metabolic encephalopathy Syncope/episode of unresponsiveness Vascular dementia Patient reportedly was confused for the past 2 days and then had an episode of unresponsiveness Etiology uncertain and differential includes orthostatic hypotension, TIA/CVA,  Symptomatic hypotension with delirium in view of underlying dementia Low suspicion for infectious etiology Low suspicion for seizure as patient had negative EEG in June We will get stroke and syncope work-up and repeat EEG (telemetry, echo and MRI, had negative Doppler in July so will not repeat) Ordered D-dimer-mildly elevated at 0.65 which also seems chronic. MRI concern of acute/subacute infarct patient has very significant underlying white matter disease which will make it difficult to have definitive assessment. Neurology is recommending completion of stroke workup and EEG tomorrow. 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 Blood pressure currently elevated.  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  Was previously on Plavix but this was discontinued by his neurologist Supportive care, assistance with transfers   Consultants: Neurology Procedures performed: EEG Disposition: Home health Diet recommendation:   Discharge Diet Orders (From admission, onward)     Start     Ordered   03/12/22 0000  Diet - low sodium heart healthy        03/12/22 1436           Cardiac diet DISCHARGE MEDICATION: Allergies as of 03/12/2022   Not on File      Medication List     STOP taking these medications    doxycycline 100 MG tablet Commonly known as: VIBRA-TABS   triamcinolone cream 0.1 % Commonly known as: KENALOG       TAKE these medications    albuterol 108 (90 Base) MCG/ACT inhaler Commonly known as: VENTOLIN HFA Inhale 2 puffs into the lungs every 6 (six) hours as needed for wheezing or shortness of breath.   Aspirin Low Dose 81 MG tablet Generic drug: aspirin EC TAKE 1 TABLET BY MOUTH EVERY DAY   cetirizine 10 MG tablet Commonly known as: ZYRTEC Take 1 tablet (10 mg total) by mouth daily. What changed:  when to take this reasons to take this   clopidogrel 75 MG tablet Commonly known as: PLAVIX Take 1 tablet (75 mg total) by mouth daily for 21 days. Start taking on: March 13, 2022   donepezil 10 MG tablet Commonly known as: ARICEPT TAKE 1 TABLET BY MOUTH EVERYDAY AT BEDTIME   memantine 10 MG tablet Commonly known as: NAMENDA TAKE 1 TABLET BY MOUTH TWICE A DAY   pantoprazole 40 MG tablet Commonly known as: PROTONIX TAKE 1 TABLET BY MOUTH EVERY DAY   Polyethyl Glycol-Propyl Glycol 0.4-0.3 %  Soln Commonly known as: Systane Apply 1 drop to eye 2 (two) times daily. What changed: how to take this   polyethylene glycol 17 g packet Commonly known as: MIRALAX / GLYCOLAX Take 17 g by mouth daily.   rosuvastatin 20 MG tablet Commonly known as: CRESTOR TAKE 1 TABLET BY MOUTH EVERY DAY   Sarna lotion Generic drug: camphor-menthol Apply 1 application topically as needed for itching.        Follow-up Information     [name], NP. Schedule an appointment as soon as possible for a visit in 1 week(s).   Specialties: Internal Medicine, Emergency  Medicine Contact information: [place] [address] [phone]                Discharge Exam: Danley Danker Weights   03/10/22 1440 2022-03-25 0500  Weight: 63.5 kg 55.9 kg   General.  Frail elderly man, in no acute distress. Pulmonary.  Lungs clear bilaterally, normal respiratory effort. CV.  Regular rate and rhythm, no JVD, rub or murmur. Abdomen.  Soft, nontender, nondistended, BS positive. CNS.  Alert and oriented .  No focal neurologic deficit. Extremities.  No edema, no cyanosis, pulses intact and symmetrical. Psychiatry.  Judgment and insight appears normal.   Condition at discharge: stable  The results of significant diagnostics from this hospitalization (including imaging, microbiology, ancillary and laboratory) are listed below for reference.   Imaging Studies: EEG adult  Result Date: 03/25/22 Greta Doom, MD     03-25-22  1:42 PM History: 76 year old male being evaluated for episode of syncope Sedation: None Technique: This EEG was acquired with electrodes placed according to the International 10-20 electrode system (including Fp1, Fp2, F3, F4, C3, C4, P3, P4, O1, O2, T3, T4, T5, T6, A1, A2, Fz, Cz, Pz). The following electrodes were missing or displaced: none. Background: The background is relatively low voltage with a posterior dominant rhythm of 9 Hz which is seen bilaterally, though at times appreciated better on the left side than right but this is not consistent.  In addition, there is generalized irregular delta and theta range activities intruding into the background throughout the recording.  With drowsiness there is an increase in slow activity, and sleep is seen with bilateral symmetric structures. Photic stimulation: Physiologic driving is not performed EEG Abnormalities: Generalized irregular slow activity Clinical Interpretation: This EEG is consistent with a mild generalized nonspecific cerebral dysfunction (encephalopathy).  There was no seizure or seizure predisposition recorded on this study. Please note that lack of epileptiform activity on EEG does not preclude the possibility of epilepsy. Roland Rack, MD [facility] [phone] If 7pm- 7am, please page neurology on call as listed in [place].   CT ANGIO HEAD NECK W WO CM  Result Date: 03/11/2022 CLINICAL DATA:  Stroke, determine embolic source EXAM: CT ANGIOGRAPHY HEAD AND NECK TECHNIQUE: Multidetector CT imaging of the head and neck was performed using the standard protocol during bolus administration of intravenous contrast. Multiplanar CT image reconstructions and MIPs were obtained to evaluate the vascular anatomy. Carotid stenosis measurements (when applicable) are obtained utilizing NASCET criteria, using the distal internal carotid diameter as the denominator. RADIATION DOSE REDUCTION: This exam was performed according to the departmental dose-optimization program which includes automated exposure control, adjustment of the mA and/or kV according to patient size and/or use of iterative reconstruction technique. CONTRAST:  24m OMNIPAQUE IOHEXOL 350 MG/ML SOLN COMPARISON:  Brain MRI from earlier today FINDINGS: CT HEAD FINDINGS Brain: Severe chronic white matter  disease. Small remote left occipital infarct. No detected change from prior brain MRI. Generalized atrophy. Vascular: Vertebrobasilar tortuosity.  No hyperdense vessel. Skull: Negative Sinuses/Orbits: No acute finding.  Gaze to the right. Review of the MIP images confirms the above findings CTA NECK FINDINGS Aortic arch: Atheromatous plaque with 3 vessel branching. Right carotid system: Limited atheromatous changes. No stenosis or beading Left carotid system: Limited atheromatous changes. No stenosis or beading. Vertebral arteries: No proximal subclavian stenosis. Both vertebral arteries are smoothly contoured and widely patent to the dura. There is some limitation to visualization proximally due  to reflux of contrast in the paravertebral plexus. Skeleton: Negative Other neck: No acute or aggressive finding Upper chest: No acute finding Review of the MIP images confirms the above findings CTA HEAD FINDINGS Anterior circulation: Major vessels show mild atheromatous calcification. Branches are diffusely patent and smoothly contoured. Posterior circulation: Vertebrobasilar tortuosity to the degree that there is mild luminal heterogeneity from turbulent flow. Chronic occlusion of the left PCA. Negative for aneurysm or vascular malformation Venous sinuses: Unremarkable for the arterial phase. Anatomic variants: None significant Review of the MIP images confirms the above findings IMPRESSION: 1. No emergent finding. 2. Chronic left PCA occlusion. 3. Limited atheromatous changes in the neck. Electronically Signed   By: Jorje Guild M.D.   On: 03/11/2022 12:35   ECHOCARDIOGRAM COMPLETE  Result Date: 03/11/2022    ECHOCARDIOGRAM REPORT   Patient Name:   Christopher Murphy [facility] Date of Exam: 03/11/2022 Medical Rec #:  [id number]             Height:       68.0 in Accession #:    [id number]            Weight:       140.0 lb Date of Birth:  06/15/1945             BSA:          1.756 m Patient Age:    37 years              BP:           119/70 mmHg Patient Gender: M                     HR:           64 bpm. Exam Location:  [facility] Procedure: 2D Echo, Color Doppler and Cardiac Doppler Indications:     I63.9 Stroke  History:         Patient has prior history of Echocardiogram examinations, most                  recent 10/28/2019.  Sonographer:     Charmayne Sheer Referring Phys:  [id number] Athena Masse Diagnosing Phys: Yolonda Kida MD  Sonographer Comments: Suboptimal apical window and no subcostal window. Image acquisition challenging due to respiratory motion. IMPRESSIONS  1. Left ventricular ejection fraction, by estimation, is 50 to 55%. The left ventricle has low normal function. The left ventricle has no regional wall  motion abnormalities. There is mild concentric left ventricular hypertrophy. Left ventricular diastolic parameters are consistent with Grade I diastolic dysfunction (impaired relaxation).  2. Right ventricular systolic function is normal. The right ventricular size is mildly enlarged. Moderately increased right ventricular wall thickness.  3. The mitral valve is normal in structure. No evidence of mitral valve regurgitation.  4. The aortic valve is normal in structure. Aortic valve regurgitation is not visualized. Mild  aortic valve stenosis. FINDINGS  Left Ventricle: Left ventricular ejection fraction, by estimation, is 50 to 55%. The left ventricle has low normal function. The left ventricle has no regional wall motion abnormalities. The left ventricular internal cavity size was normal in size. There is mild concentric left ventricular hypertrophy. Left ventricular diastolic parameters are consistent with Grade I diastolic dysfunction (impaired relaxation). Right Ventricle: The right ventricular size is mildly enlarged. Moderately increased right ventricular wall thickness. Right ventricular systolic function is normal. Left Atrium: Left atrial size was normal in size. Right Atrium: Right atrial size was normal in size. Pericardium: There is no evidence of pericardial effusion. Mitral Valve: The mitral valve is normal in structure. No evidence of mitral valve regurgitation. Tricuspid Valve: The tricuspid valve is normal in structure. Tricuspid valve regurgitation is mild. Aortic Valve: The aortic valve is normal in structure. Aortic valve regurgitation is not visualized. Mild aortic stenosis is present. Aortic valve mean gradient measures 2.0 mmHg. Aortic valve peak gradient measures 3.5 mmHg. Aortic valve area, by VTI measures 2.36 cm. Pulmonic Valve: The pulmonic valve was normal in structure. Pulmonic valve regurgitation is trivial. Aorta: The ascending aorta was not well visualized. IAS/Shunts: No atrial level  shunt detected by color flow Doppler.  LEFT VENTRICLE PLAX 2D LVIDd:         3.50 cm   Diastology LVIDs:         2.70 cm   LV e' medial:    6.42 cm/s LV PW:         1.10 cm   LV E/e' medial:  5.5 LV IVS:        1.00 cm   LV e' lateral:   8.16 cm/s LVOT diam:     2.00 cm   LV E/e' lateral: 4.4 LV SV:         48 LV SV Index:   27 LVOT Area:     3.14 cm  RIGHT VENTRICLE RV Basal diam:  3.50 cm RV S prime:     16.30 cm/s LEFT ATRIUM           Index        RIGHT ATRIUM           Index LA diam:      3.90 cm 2.22 cm/m   RA Area:     18.10 cm LA Vol (A4C): 34.0 ml 19.36 ml/m  RA Volume:   52.20 ml  29.72 ml/m  AORTIC VALVE                    PULMONIC VALVE AV Area (Vmax):    2.50 cm     PV Vmax:       0.50 m/s AV Area (Vmean):   2.22 cm     PV Vmean:      34.200 cm/s AV Area (VTI):     2.36 cm     PV VTI:        0.081 m AV Vmax:           93.30 cm/s   PV Peak grad:  1.0 mmHg AV Vmean:          66.000 cm/s  PV Mean grad:  1.0 mmHg AV VTI:            0.202 m AV Peak Grad:      3.5 mmHg AV Mean Grad:      2.0 mmHg LVOT Vmax:         74.20 cm/s  LVOT Vmean:        46.700 cm/s LVOT VTI:          0.152 m LVOT/AV VTI ratio: 0.75  AORTA Ao Root diam: 3.30 cm MITRAL VALVE               TRICUSPID VALVE MV Area (PHT): 3.50 cm    TR Peak grad:   13.5 mmHg MV Decel Time: 217 msec    TR Vmax:        184.00 cm/s MV E velocity: 35.60 cm/s MV A velocity: 54.00 cm/s  SHUNTS MV E/A ratio:  0.66        Systemic VTI:  0.15 m                            Systemic Diam: 2.00 cm Yolonda Kida MD Electronically signed by Yolonda Kida MD Signature Date/Time: 03/11/2022/12:06:48 PM    Final    MR BRAIN WO CONTRAST  Result Date: 03/11/2022 CLINICAL DATA:  Altered mental status.  Hypotension EXAM: MRI HEAD WITHOUT CONTRAST TECHNIQUE: Multiplanar, multiecho pulse sequences of the brain and surrounding structures were obtained without intravenous contrast. COMPARISON:  10/28/2019 and 04/06/2016 FINDINGS: Brain: There is chronic  hyperintensity on diffusion-weighted imaging within the right greater than left subcortical frontal lobes and in the right parietal lobe. Areas of abnormality in the right parietal lobe and left frontal operculum have mildly expanded the prior study (series 5, image 34), possibly a Christopher area acute/subacute ischemia. No acute or chronic hemorrhage. There is confluent hyperintense T2-weighted signal within the white matter. There is advanced atrophy. The midline structures are normal. Vascular: Major flow voids are preserved. Skull and upper cervical spine: Normal calvarium and skull base. Visualized upper cervical spine and soft tissues are normal. Sinuses/Orbits:No paranasal sinus fluid levels or advanced mucosal thickening. No mastoid or middle ear effusion. Normal orbits. IMPRESSION: 1. Suspect Christopher areas of acute/subacute ischemia in the right parietal and left frontal lobes. No hemorrhage or mass effect. 2. Severe chronic ischemic changes of the subcortical and deep white matter. Advanced volume loss. Electronically Signed   By: Ulyses Jarred M.D.   On: 03/11/2022 01:01   DG Abd Portable 1V  Result Date: 03/11/2022 CLINICAL DATA:  MRI clearance EXAM: PORTABLE ABDOMEN - 1 VIEW COMPARISON:  None Available. FINDINGS: No metallic foreign bodies noted. Mild diffuse gaseous distention of bowel. No visible organomegaly, free air or suspicious calcification. IMPRESSION: No visible metallic foreign bodies. Electronically Signed   By: Rolm Baptise M.D.   On: 03/11/2022 00:01   DG Chest Port 1 View  Result Date: 03/10/2022 CLINICAL DATA:  Stroke EXAM: PORTABLE CHEST 1 VIEW COMPARISON:  Chest x-ray 06/02/2020 FINDINGS: The heart size and mediastinal contours are within normal limits. Both lungs are clear. The visualized skeletal structures are unremarkable. IMPRESSION: No active disease. Electronically Signed   By: Ronney Asters M.D.   On: 03/10/2022 22:26   CT HEAD WO CONTRAST  Result Date: 03/10/2022 CLINICAL  DATA:  76 year old male with sudden onset altered mental status and hypotensive. EXAM: CT HEAD WITHOUT CONTRAST TECHNIQUE: Contiguous axial images were obtained from the base of the skull through the vertex without intravenous contrast. RADIATION DOSE REDUCTION: This exam was performed according to the departmental dose-optimization program which includes automated exposure control, adjustment of the mA and/or kV according to patient size and/or use of iterative reconstruction technique. COMPARISON:  Head CT 05/09/2020.  Brain MRI 10/28/2019. FINDINGS:  Brain: Advanced chronic cerebral white matter disease. Chronic left PCA territory infarct with occipital lobe encephalomalacia. Chronic mild midline superior cerebellar encephalomalacia. Stable gray-white matter differentiation throughout the brain. Stable cerebral volume. No midline shift, ventriculomegaly, mass effect, evidence of mass lesion, intracranial hemorrhage or evidence of cortically based acute infarction. Vascular: Generalized intracranial artery tortuosity, dolichoectasia is chronic (chronically tortuous 7 mm diameter basilar artery). No suspicious intracranial vascular hyperdensity. Skull: No acute osseous abnormality identified. Sinuses/Orbits: Well aerated sinuses, chronic left ethmoid mucosal thickening and opacification not significantly changed. Other: No acute orbit or scalp soft tissue finding. IMPRESSION: 1. No acute intracranial abnormality. 2. Stable non contrast CT appearance of the brain since last year with advanced intracranial artery dolichoectasia, advanced white matter disease, chronic left PCA infarct. Electronically Signed   By: Genevie Ann M.D.   On: 03/10/2022 15:34    Microbiology: Results for orders placed or performed during the hospital encounter of 03/10/22  Resp Panel by RT-PCR (Flu A&B, Covid) Anterior Nasal Swab     Status: None   Collection Time: 03/11/22  4:19 AM   Specimen: Anterior Nasal Swab  Result Value Ref Range  Status   SARS Coronavirus 2 by RT PCR NEGATIVE NEGATIVE Final    Comment: (NOTE) SARS-CoV-2 target nucleic acids are NOT DETECTED.  The SARS-CoV-2 RNA is generally detectable in upper respiratory specimens during the acute phase of infection. The lowest concentration of SARS-CoV-2 viral copies this assay can detect is 138 copies/mL. A negative result does not preclude SARS-Cov-2 infection and should not be used as the sole basis for treatment or other patient management decisions. A negative result may occur with  improper specimen collection/handling, submission of specimen other than nasopharyngeal swab, presence of viral mutation(s) within the areas targeted by this assay, and inadequate number of viral copies(<138 copies/mL). A negative result must be combined with clinical observations, patient history, and epidemiological information. The expected result is Negative.  Fact Sheet for Patients:  EntrepreneurPulse.com.au  Fact Sheet for Healthcare Providers:  IncredibleEmployment.be  This test is no t yet approved or cleared by the [place] FDA and  has been authorized for detection and/or diagnosis of SARS-CoV-2 by FDA under an Emergency Use Authorization (EUA). This EUA will remain  in effect (meaning this test can be used) for the duration of the COVID-19 declaration under Section 564(b)(1) of the Act, 21 U.S.C.section 360bbb-3(b)(1), unless the authorization is terminated  or revoked sooner.       Influenza A by PCR NEGATIVE NEGATIVE Final   Influenza B by PCR NEGATIVE NEGATIVE Final    Comment: (NOTE) The Xpert Xpress SARS-CoV-2/FLU/RSV plus assay is intended as an aid in the diagnosis of influenza from Nasopharyngeal swab specimens and should not be used as a sole basis for treatment. Nasal washings and aspirates are unacceptable for Xpert Xpress SARS-CoV-2/FLU/RSV testing.  Fact Sheet for  Patients: EntrepreneurPulse.com.au  Fact Sheet for Healthcare Providers: IncredibleEmployment.be  This test is not yet approved or cleared by the [place] FDA and has been authorized for detection and/or diagnosis of SARS-CoV-2 by FDA under an Emergency Use Authorization (EUA). This EUA will remain in effect (meaning this test can be used) for the duration of the COVID-19 declaration under Section 564(b)(1) of the Act, 21 U.S.C. section 360bbb-3(b)(1), unless the authorization is terminated or revoked.  Performed at [facility], [address] [place], [place] [address]     Labs: CBC: Recent Labs  Lab 03/10/22 1455  WBC 6.5  NEUTROABS 3.9  HGB 12.3*  HCT 38.1*  MCV 83.7  PLT 563   Basic Metabolic Panel: Recent Labs  Lab 03/10/22 1455  NA 139  K 4.2  CL 108  CO2 28  GLUCOSE 100*  BUN 12  CREATININE 0.89  0.94  CALCIUM 8.7*   Liver Function Tests: Recent Labs  Lab 03/10/22 1455  AST 18  ALT 15  ALKPHOS 51  BILITOT 0.6  PROT 6.6  ALBUMIN 3.4*   CBG: Recent Labs  Lab 03/12/22 0628  GLUCAP 83    Discharge time spent: greater than 30 minutes.  This record has been created using Systems analyst. Errors have been sought and corrected,but may not always be located. Such creation errors do not reflect on the standard of care.   Signed: Lorella Nimrod, MD [facility] 03/12/2022

## 2022-03-12 NOTE — Progress Notes (Signed)
Subjective: Patient is doing much better than he was previously.  Per daughter, he has a history of confusion with slight perturbations in his normal physiology, such as infections, etc.  It was this type of episode that provoked concern for seizures in the past, he has never had an episode of behavioral arrest or loss of consciousness previously.    Exam: Vitals:   03/12/22 0105 03/12/22 0626  BP: (!) 145/86 131/78  Pulse: 69 65  Resp: 16 16  Temp: 97.8 F (36.6 C) (!) 97.5 F (36.4 C)  SpO2: 100% 100%   Gen: In bed, NAD Resp: non-labored breathing, no acute distress Abd: soft, nt  Neuro: MS: Awake, alert, some difficulty with slightly complicated commands, he does recommend his daughter. CN: Pupils are postsurgical bilaterally, he does fix and track cross midline Motor: No drift Sensory: He does not really understand when trying to check sensation  Pertinent Labs: D-dimer slightly elevated  Impression: 76 year old male with a history of vascular dementia as well as chronically abnormal diffusion imaging on MRI who presents after an episode of syncope versus seizure.  His blood pressure was in the 80s initially, and my suspicion is that this represents syncope as opposed to seizure, though this is not definite.  I do not think I would start antiepileptics based on the semiology described, especially when coupled with the fact that his blood pressures were soft on arrival.  Memantine can be associated with orthostatic hypotension, but he was seated the entire time during this episode.  Recommendations: 1) avoid hypotension 2) continue telemetry 3) EEG 4) if EEG is negative, I am not certain that I would make any changes based on this episode.  If he continues to have episodes, could consider either an event monitor or empiric antiepileptic trial 5) neurology will follow  Roland Rack, MD [facility] [phone]  If 7pm- 7am, please page neurology on  call as listed in [place].

## 2022-03-12 NOTE — Progress Notes (Signed)
Speech Language Pathology Treatment:    Patient Details Name: Tavish Gettis MRN: [id number] DOB: [birth year] Today's Date: 03/12/2022 Time: 1310-1330 SLP Time Calculation (min) (ACUTE ONLY): 20 min  Assessment / Plan / Recommendation Clinical Impression  Pt alert and pleasant. Son present. Offered interpreter and son declined stating she would prefer to interpret for patient.   Pt seen for diet tolerance. Per son report and SLP observation, pt tolerating current diet without overt s/sx pharyngeal dysphagia. Improving oral dysphagia noted as evidenced by mildly prolonged, but functional, mastication of solid with adequate oral clearance. No pocketing observed.   Pt seen for speech/language tx. Pt with continued difficulty answering yes/no questions - ~50% independently. This did not improve with cueing. Increased difficulty with environmental yes/no questions. Improving ability to follow 1-step commands - 80% accuracy with extra time; 100% accuracy with direct clinician modeling. Pt able to answer several basic biographical questions independently. Son did not endorse neologisms this date. Son did note that pt appears to be nearing cognitive-linguistic baseline and that pt's speech is much clearer than yesterday. Suspect pt's baseline cognitive deficits and hearing status affecting performance on skilled speech tx tasks.   Recommend continuation of a regular diet with thin liquids and safe swallowing strategies as outlined below.   Based on today's tx, anticipate need for 24 hr supervision assistance and post-acute SLP services.   Pt's son educated re: POC, diet recommendations/safe swallowing strategies, progress to date, and d/c recommendations. Son verbalized understanding/agreement. RN made aware of results, recommendations, SLP POC. TOC updated with recommendations.   SLP to continue to f/u while pt in house for speech/language tx.    HPI HPI: Per H&P "Wilson Einer Meals is a 76  y.o. 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  Most of the history is given at the bedside by his son who states that he has been confused for the past 2 days, not acting himself until the event tonight.     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  His dementia medication was increased and follow-up was given for 1 year.  EEG done in June showed no seizure activity.  He also had a carotid study in July that showed no significant stenosis.  On arrival of EMS, BP was 86/60, fluid responsive to SBP of 130 following 500 mL bolus of NS.  ED course and data review: By arrival to the ED, patient was awake and alert, vitals were within normal limits.  Labs including CBC, CMP, troponin, EtOH, urinalysis were all unremarkable except for hemoglobin of 12.3.EKG, personally viewed and interpreted showed NSR at 68 with no ischemic ST-T wave changes.  CT head nonacute, showing the following:  IMPRESSION:  1. No acute intracranial abnormality.  2. Stable non contrast CT appearance of the brain since last year  with advanced intracranial artery dolichoectasia, advanced white  matter disease, chronic left PCA infarct." MRI "1. Suspect new areas of acute/subacute ischemia in the right  parietal and left frontal lobes.  No hemorrhage or mass effect.  2. Severe chronic ischemic changes of the subcortical and deep white  matter. Advanced volume loss."      SLP Plan  Continue with  current plan of care  Patient needs continued Speech Lanaguage Pathology Services   Recommendations for follow up therapy are one component of a multi-disciplinary discharge planning process, led by the attending physician.  Recommendations may be updated based on patient status, additional functional criteria and insurance authorization.    Recommendations  Diet recommendations: Regular;Thin liquid Medication Administration: Whole meds with liquid Supervision: Patient able to self feed;Intermittent supervision to cue for compensatory strategies Compensations: Minimize environmental distractions;Slow rate;Small sips/bites;Follow solids with liquid Postural Changes and/or Swallow Maneuvers: Out of bed for meals;Seated upright 90 degrees;Upright 30-60 min after meal                Oral Care Recommendations: Oral care QID;Staff/trained caregiver to provide oral care Follow Up Recommendations: Home health SLP Assistance recommended at discharge: Frequent or constant Supervision/Assistance SLP Visit Diagnosis: Dysphagia, oral phase (R13.11);Aphasia (R47.01);Dysarthria and anarthria (R47.1);Cognitive communication deficit (M25.003) Plan: Continue with current plan of care          Cherrie Gauze, M.S., [facility] [phone] ([place])   Quintella Baton  03/12/2022, 2:18 PM

## 2022-03-12 NOTE — TOC Transition Note (Signed)
Transition of Care [facility]) - CM/SW Discharge Note   Patient Details  Name: Christopher Murphy MRN: [id number] Date of Birth: [birth year]  [facility] of [facility] CM/SW Contact:  Laurena Slimmer, RN Phone Number: 03/12/2022, 4:03 PM   Clinical Narrative:    [facility] previously arranged with Alvis Lemmings.  [facility] adivsed of patient discharge today.   TOC signing off.           Patient Goals and CMS Choice        Discharge Placement                       Discharge Plan and Services                                     Social Determinants of Health (SDOH) Interventions     Readmission Risk Interventions     No data to display

## 2022-03-12 NOTE — Procedures (Signed)
History: 76 year old male being evaluated for episode of syncope  Sedation: None  Technique: This EEG was acquired with electrodes placed according to the International 10-20 electrode system (including Fp1, Fp2, F3, F4, C3, C4, P3, P4, O1, O2, T3, T4, T5, T6, A1, A2, Fz, Cz, Pz). The following electrodes were missing or displaced: none.   Background: The background is relatively low voltage with a posterior dominant rhythm of 9 Hz which is seen bilaterally, though at times appreciated better on the left side than right but this is not consistent.  In addition, there is generalized irregular delta and theta range activities intruding into the background throughout the recording.  With drowsiness there is an increase in slow activity, and sleep is seen with bilateral symmetric structures.  Photic stimulation: Physiologic driving is not performed  EEG Abnormalities: Generalized irregular slow activity  Clinical Interpretation: This EEG is consistent with a mild generalized nonspecific cerebral dysfunction (encephalopathy). There was no seizure or seizure predisposition recorded on this study. Please note that lack of epileptiform activity on EEG does not preclude the possibility of epilepsy.   Roland Rack, MD [facility] [phone]  If 7pm- 7am, please page neurology on call as listed in [place].

## 2022-03-12 NOTE — Progress Notes (Signed)
Eeg done 

## 2022-03-13 ENCOUNTER — Telehealth: Payer: Self-pay

## 2022-03-13 NOTE — Telephone Encounter (Signed)
Transition Care Management Unsuccessful Follow-up Telephone Call  Date of discharge and from where:  [place] 03-12-22 Dx: acute metabolic encephalopathy  Attempts:  1st Attempt  Reason for unsuccessful TCM follow-up call:  Left voice message   Juanda Crumble LPN [place] Direct Dial [phone]

## 2022-03-14 NOTE — Telephone Encounter (Signed)
Transition Care Management Unsuccessful Follow-up Telephone Call  Date of discharge and from where:    [place] 03-12-22 Dx: acute metabolic encephalopathy    Attempts:  2nd Attempt  Reason for unsuccessful TCM follow-up call:  Left voice message   Juanda Crumble LPN [place] Direct Dial [phone]

## 2022-03-15 NOTE — Telephone Encounter (Signed)
Transition Care Management Unsuccessful Follow-up Telephone Call  Date of discharge and from where:    [place] 03-12-22 Dx: acute metabolic encephalopathy    Attempts:  3rd Attempt  Reason for unsuccessful TCM follow-up call:  Left voice message   Juanda Crumble LPN [place] Direct Dial [phone]

## 2022-03-24 IMAGING — CT CT HEAD W/O CM
3 of 4 series · 15 of 47 positions shown, 18 images · non-contrast
Comparison: MRI/MRA head 03/31/2018, CT angiogram head/neck
04/05/2016

CLINICAL DATA: Transient ischemic attack; history of CVA, TIA on
Plavix, new disorientation and confusion, evaluate for INOX or signs
of new CVA.

EXAM:
CT HEAD WITHOUT CONTRAST
TECHNIQUE: Contiguous axial images were obtained from the base of the skull
through the vertex without intravenous contrast.

[Series 2: head wo · axial · 0.44mm/px · z∈[-144,-4]mm · 9 of 34 slices shown, 12 images]
[im 3/34  brain]
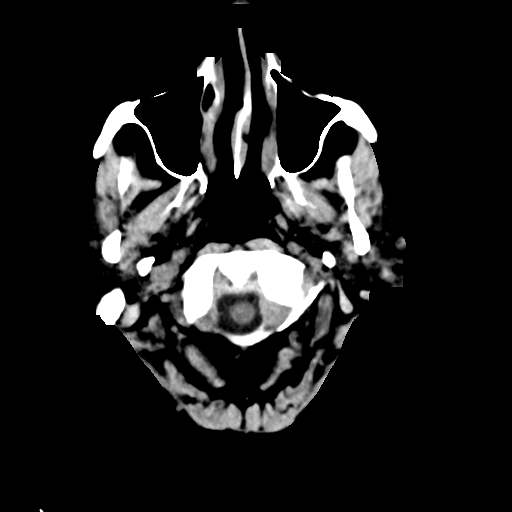
[im 3/34  bone]
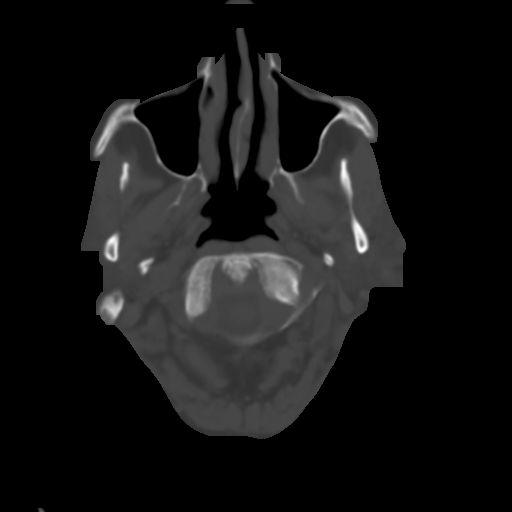
[im 8/34  brain]
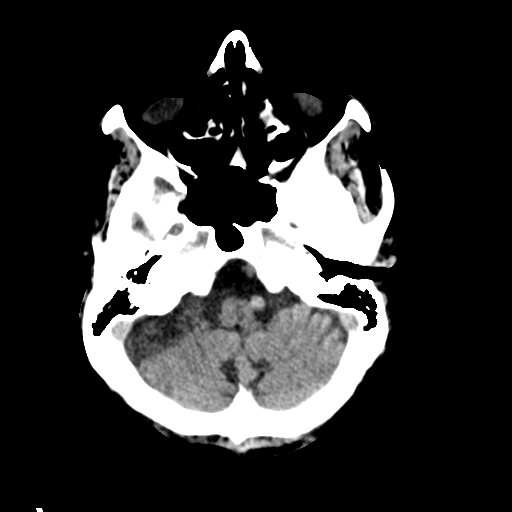
[im 10/34  brain]
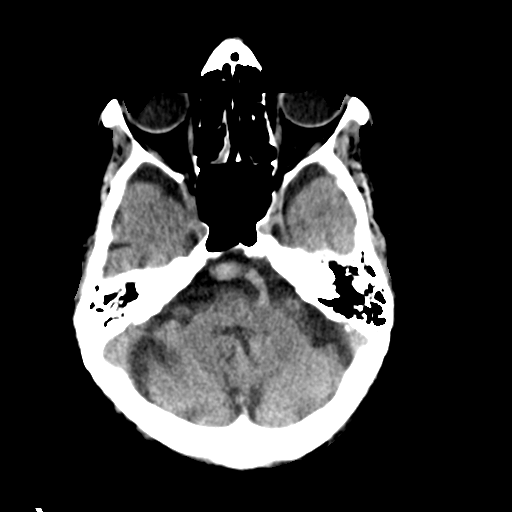
[im 15/34  brain]
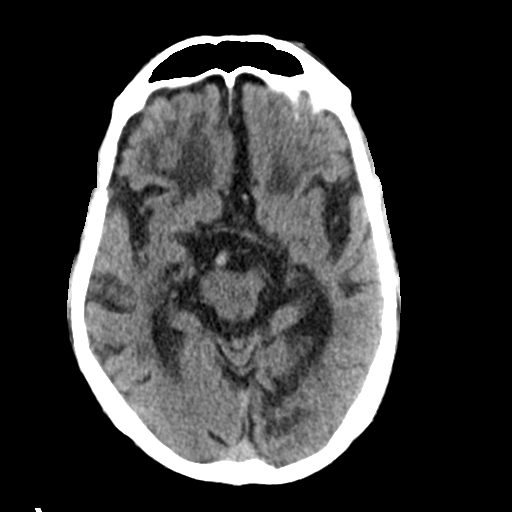
[im 17/34  brain]
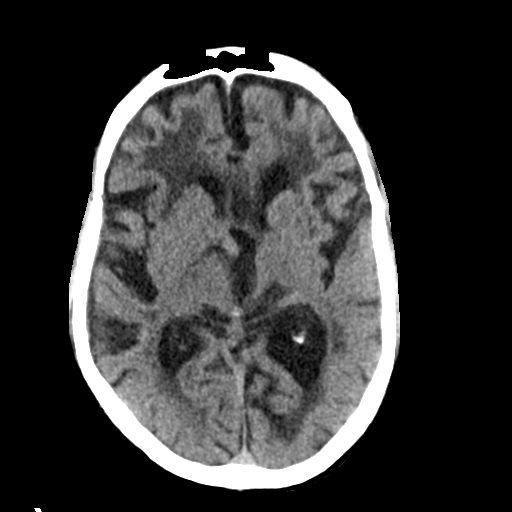
[im 17/34  bone]
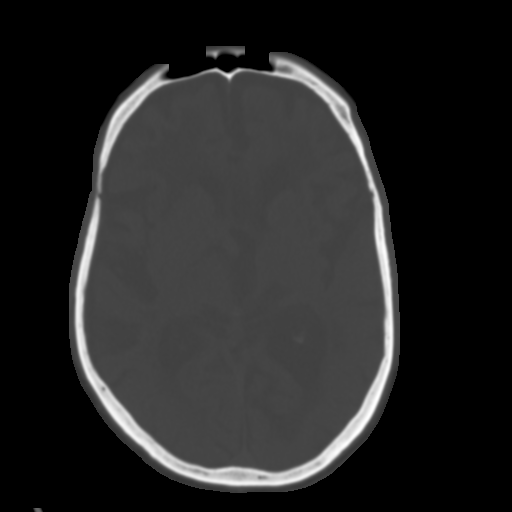
[im 19/34  brain]
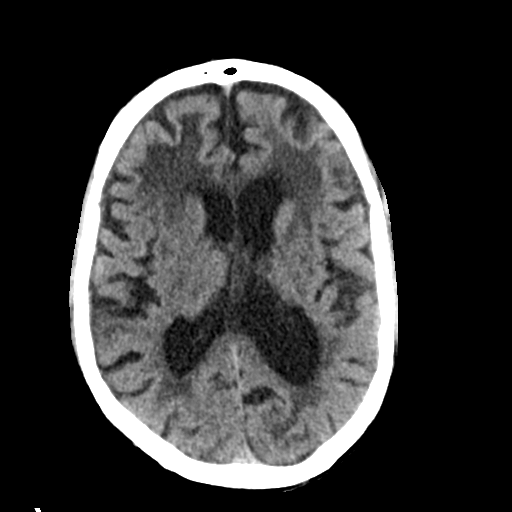
[im 24/34  brain]
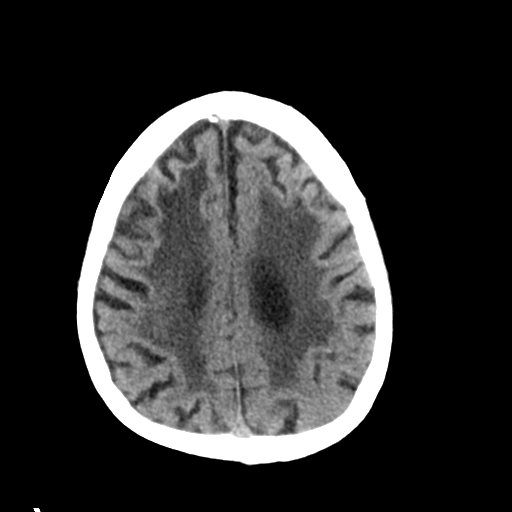
[im 26/34  brain]
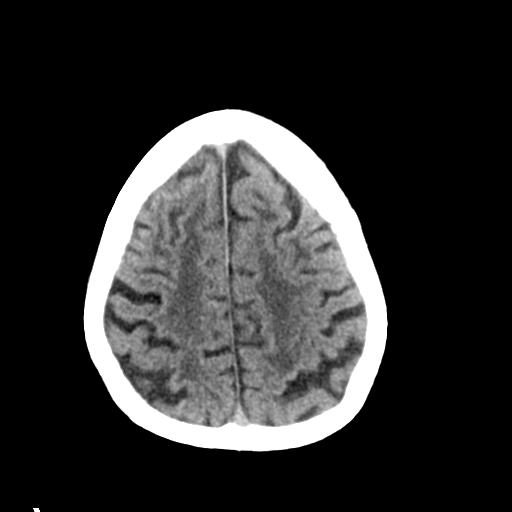
[im 31/34  brain]
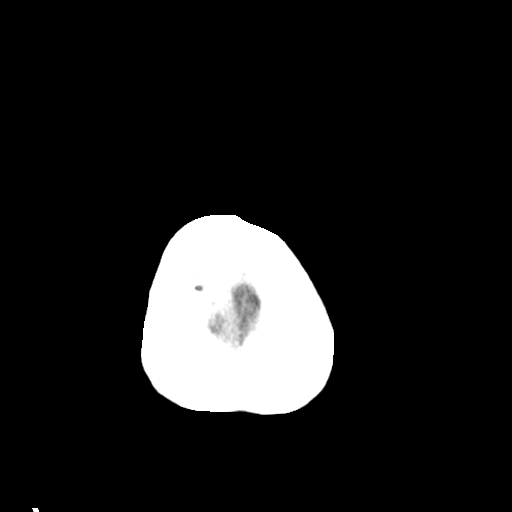
[im 31/34  bone]
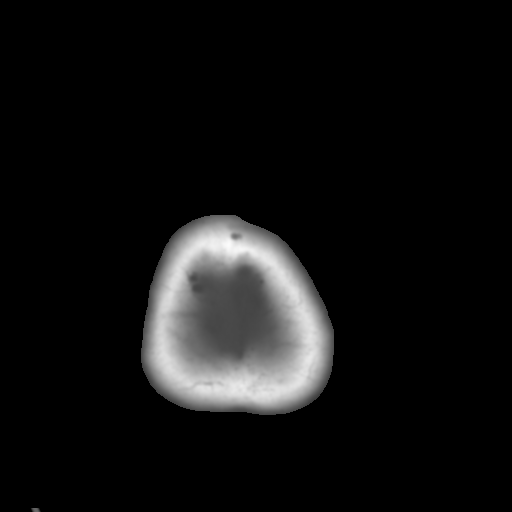

[Series 6: coronal soft tissue · coronal · 0.34mm/px · 3 of 72 slices shown]
[im 24/72  brain]
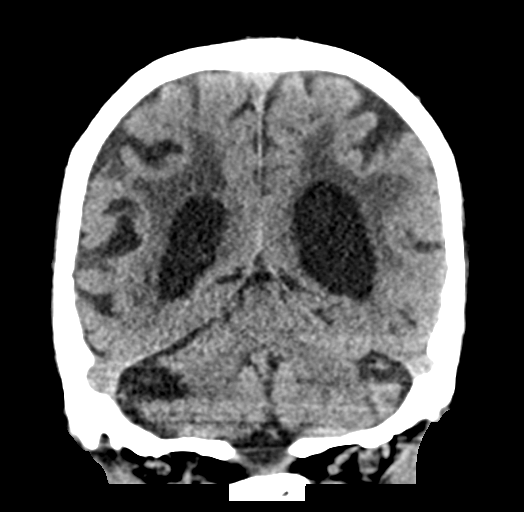
[im 32/72  brain]
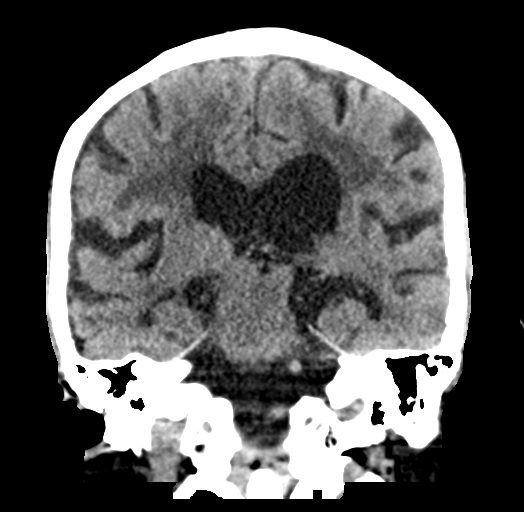
[im 40/72  brain]
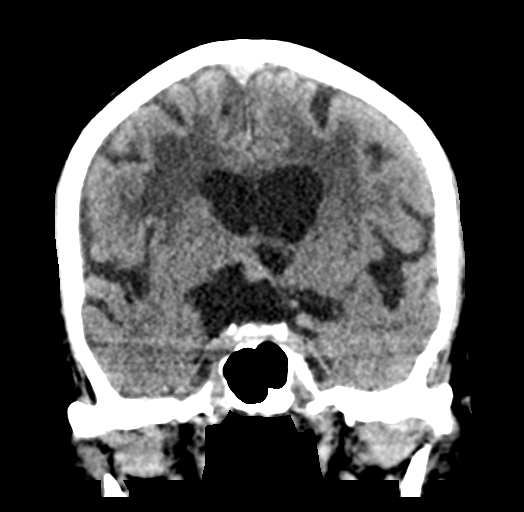

[Series 7: sagittal soft tissue · sagittal · 0.35mm/px · 3 of 61 slices shown]
[im 21/61  brain]
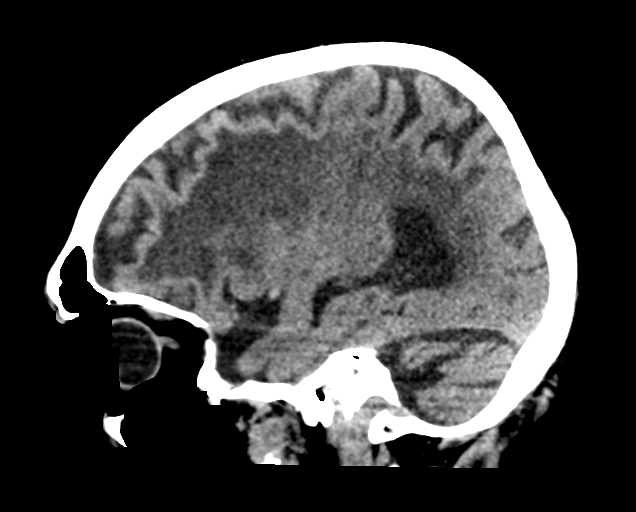
[im 31/61  brain]
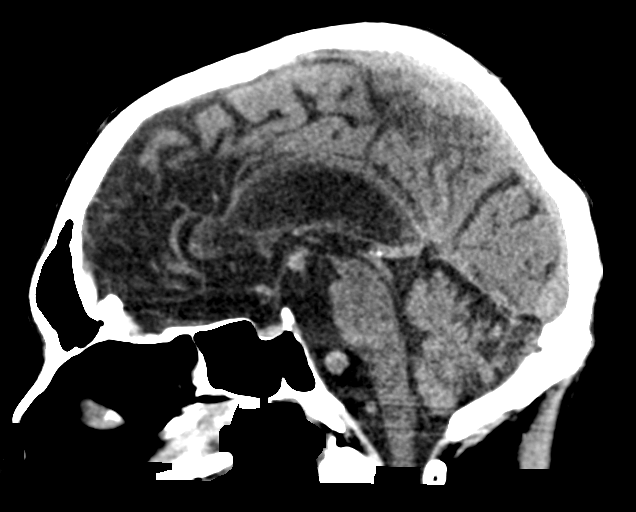
[im 41/61  brain]
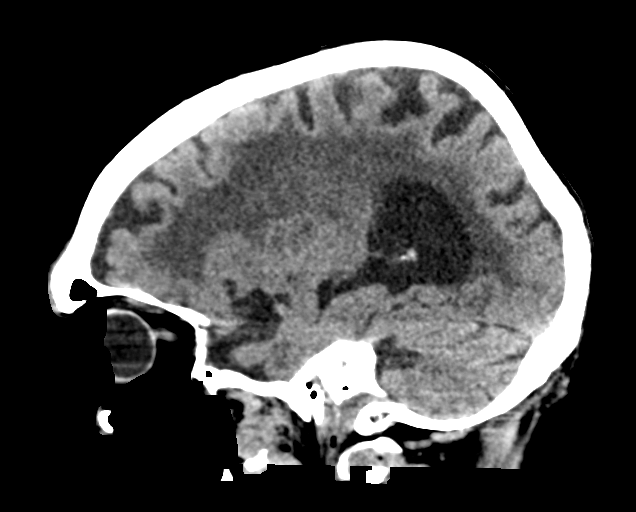

[15 of 47 positions shown; findings below may reference images not displayed]

FINDINGS: Brain:

Stable, moderate generalized parenchymal atrophy.

Redemonstrated chronic left PCA territory infarct within the left
occipital lobe and posterior left thalamus

Unchanged advanced confluent hypoattenuation within the cerebral
white matter which is nonspecific, but most commonly seen on the
basis of chronic small vessel ischemia.

There is no acute intracranial hemorrhage.

No acute demarcated cortical infarct is identified.

No extra-axial fluid collection.

No evidence of intracranial mass.

No midline shift.

Vascular: No hyperdense vessel.  Vertebrobasilar dolichoectasia.

Skull: Normal. Negative for fracture or focal lesion.

Sinuses/Orbits: Visualized orbits show no acute finding. Moderate
ethmoid sinus mucosal thickening, as well as frothy secretions
within posterior left ethmoid air cells. No significant mastoid
effusion.
IMPRESSION: No CT evidence of acute intracranial abnormality.

Redemonstrated chronic left PCA territory infarct.

Stable, advanced chronic small vessel ischemic changes within the
cerebral white matter.

Stable, moderate generalized parenchymal atrophy.

Vertebrobasilar dolichoectasia.

Ethmoid sinusitis.

## 2022-03-24 IMAGING — US US CAROTID DUPLEX BILAT
1 series · 13 of 24 positions shown · non-contrast
Comparison: None.

CLINICAL DATA: Confusion.  History of stroke.

EXAM:
BILATERAL CAROTID DUPLEX ULTRASOUND
TECHNIQUE: Gray scale imaging, color Doppler and duplex ultrasound were
performed of bilateral carotid and vertebral arteries in the neck.

[Series 1: us carotid bilateral · 13 of 66 slices shown]
[im 1/66]
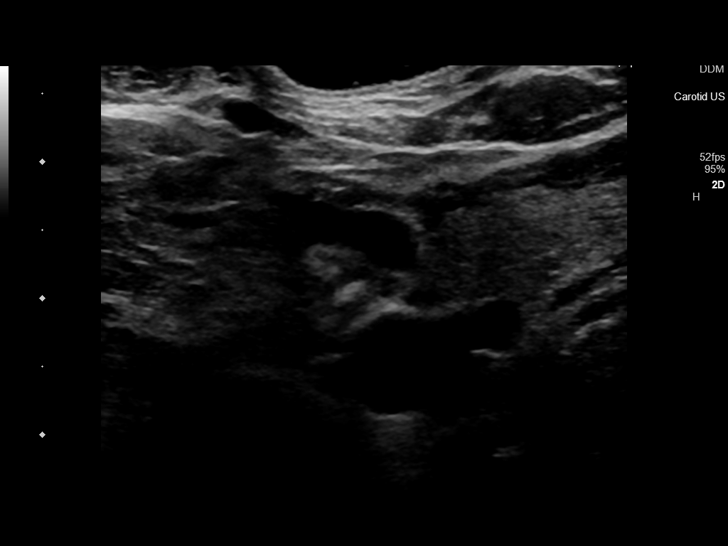
[im 6/66]
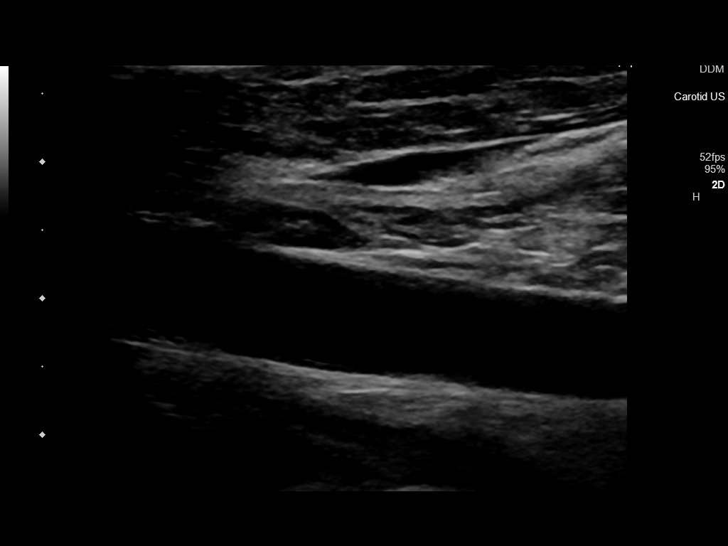
[im 12/66]
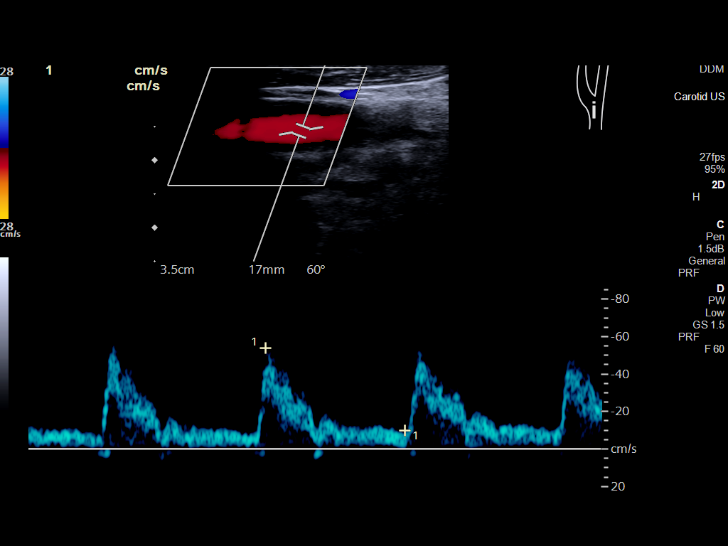
[im 17/66]
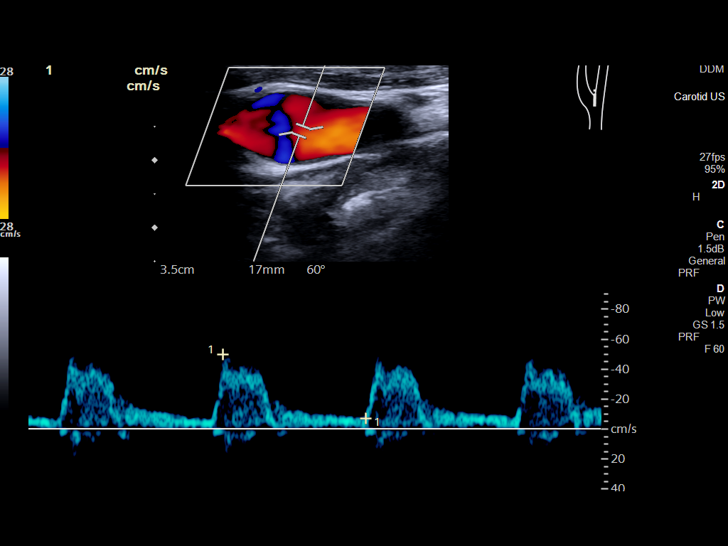
[im 23/66]
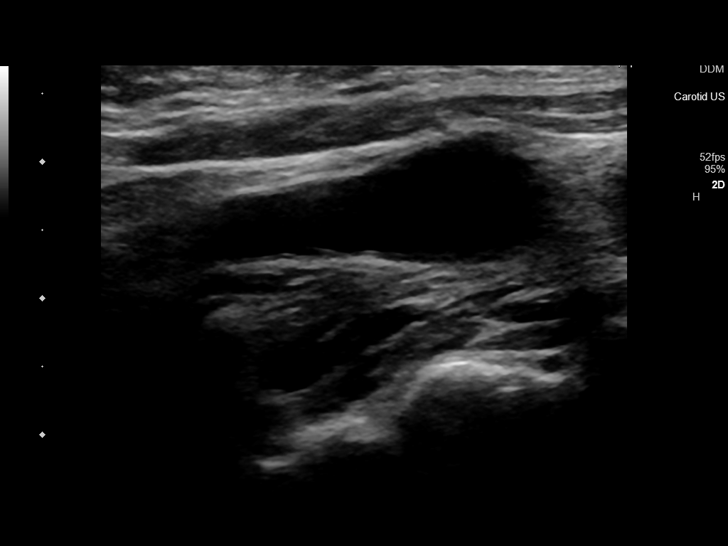
[im 29/66]
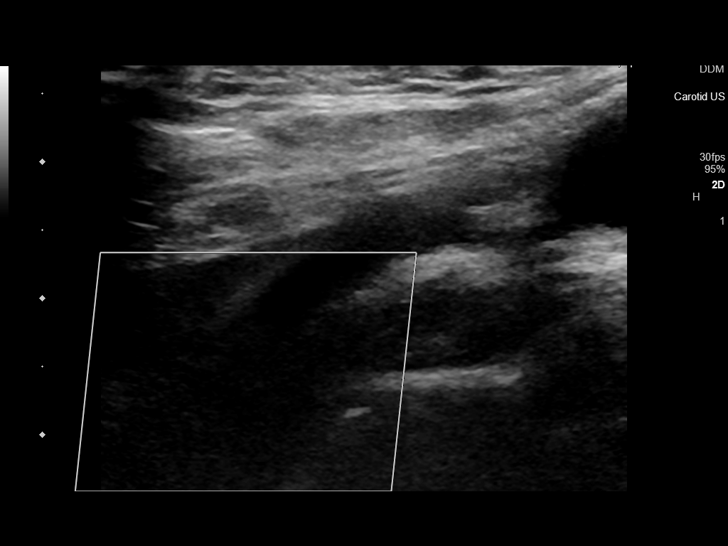
[im 34/66]
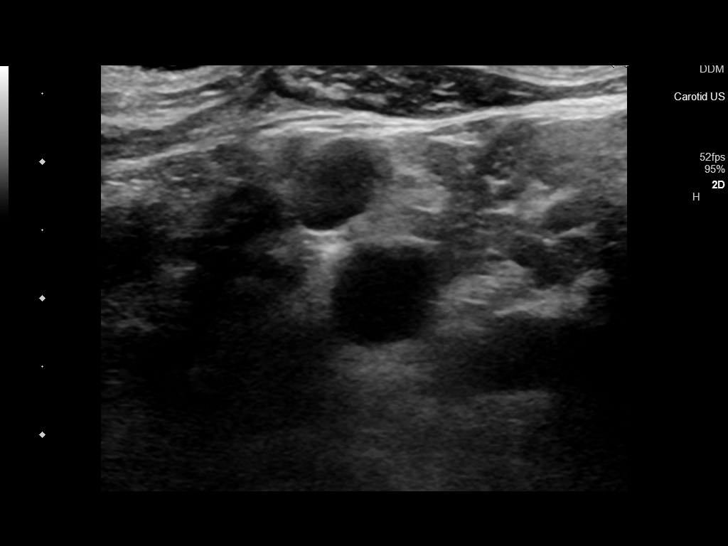
[im 37/66]
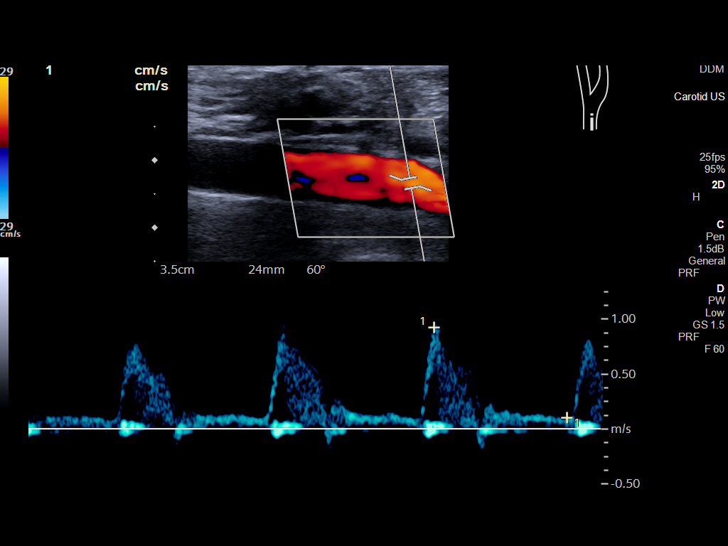
[im 43/66]
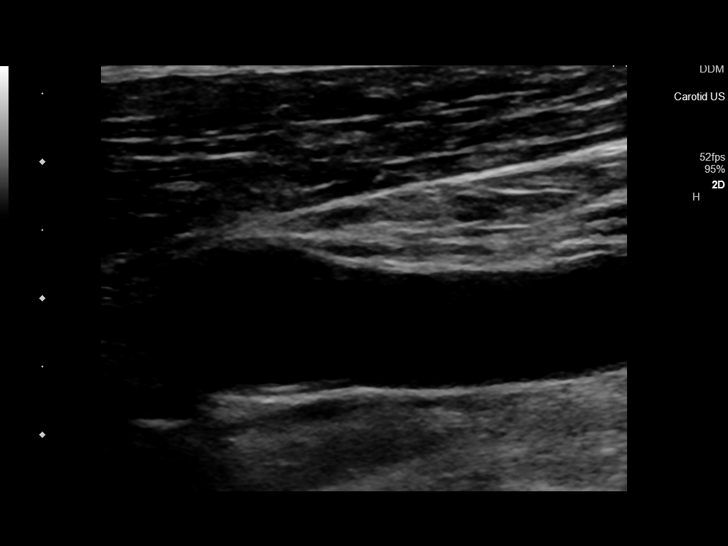
[im 49/66]
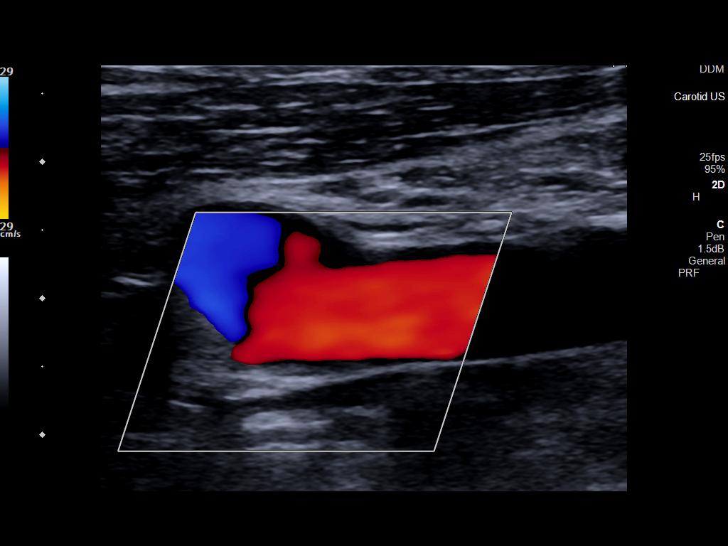
[im 54/66]
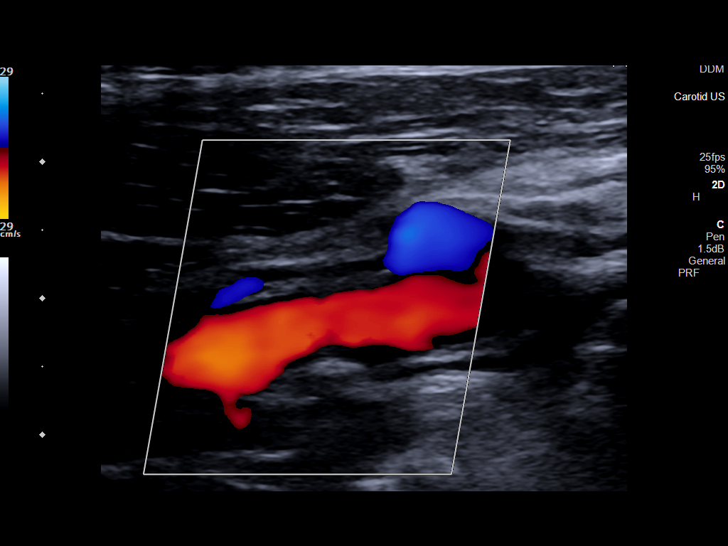
[im 60/66]
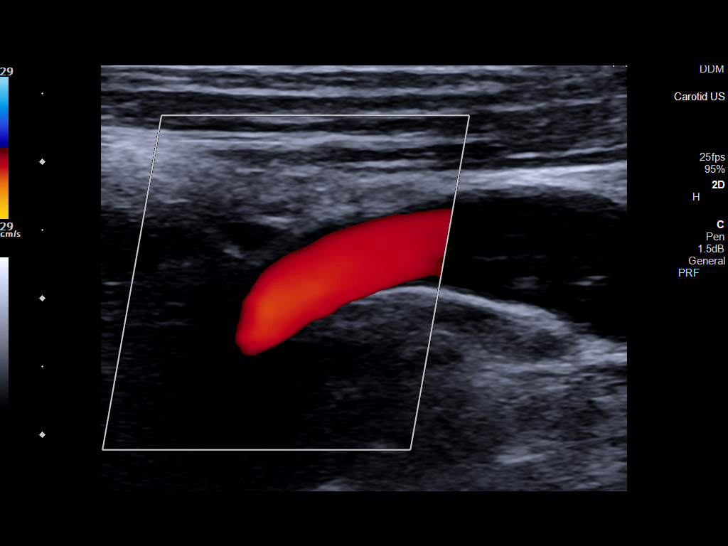
[im 66/66]
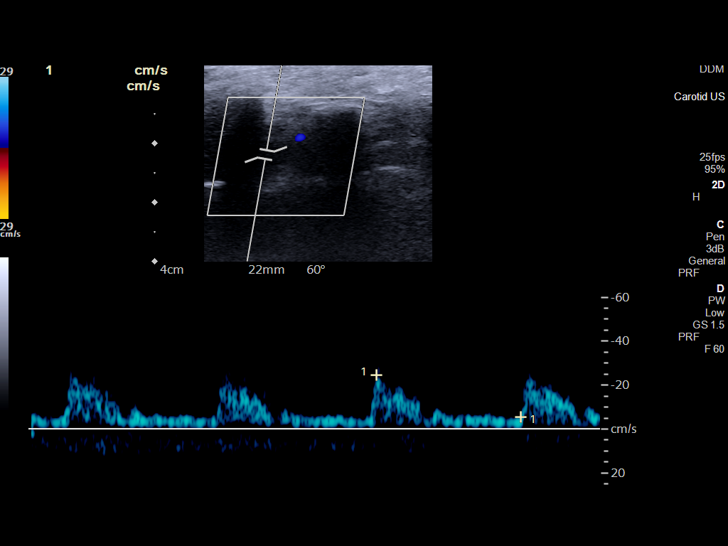

[13 of 24 positions shown; findings below may reference images not displayed]

FINDINGS: Criteria: Quantification of carotid stenosis is based on velocity
parameters that correlate the residual internal carotid diameter
with NASCET-based stenosis levels, using the diameter of the distal
internal carotid lumen as the denominator for stenosis measurement.

The following velocity measurements were obtained:

RIGHT

ICA: 101/24 cm/sec

CCA: 67/9 cm/sec

SYSTOLIC ICA/CCA RATIO:

ECA: 71 cm/sec

LEFT

ICA: 76/22 cm/sec

CCA: 68/11 cm/sec

SYSTOLIC ICA/CCA RATIO:

ECA: 62 cm/sec

RIGHT CAROTID ARTERY: Right carotid arteries are patent without
significant plaque or stenosis. Normal waveforms and velocities in
the internal carotid artery. External carotid artery is patent with
normal waveform.

RIGHT VERTEBRAL ARTERY: Antegrade flow and normal waveform in the
right vertebral artery.

LEFT CAROTID ARTERY: Left carotid arteries are patent without
significant plaque or stenosis. Normal waveforms and velocities in
the internal carotid artery. External carotid artery is patent with
normal waveform.

LEFT VERTEBRAL ARTERY: Antegrade flow and normal waveform in the
left vertebral artery.
IMPRESSION: Normal carotid artery duplex examination. Carotid arteries are
patent without significant plaque or stenosis.

## 2022-05-03 ENCOUNTER — Ambulatory Visit: Payer: Self-pay | Admitting: *Deleted

## 2022-05-03 NOTE — Telephone Encounter (Signed)
  Chief Complaint: COvid Positive Symptoms: Cough, mild headache, LGT 99.8, mild body aches Frequency: Onset yesterday am, tested positive last night Pertinent Negatives: Patient denies SOB Disposition: '[]'$ ED /'[]'$ Urgent Care (no appt availability in office) / '[]'$ Appointment(In office/virtual)/ '[]'$  [facility]/ '[]'$ Home Care/ '[]'$ Refused Recommended Disposition /'[]'$ [facility]/ '[x]'$  Follow-up with PCP Additional Notes: Pt's daughter in law calling. Requesting oral anti viral, no availability. Requesting virtual today "If we can please get  in."  Advised would route to practice for PCPs review. Care advise provided as well as self isolation guidelines. Verbalizes understanding. Advised of [facility] as well. Would like practice if possible.  CAlled during practice's lunch break. Reason for Disposition  [1] HIGH RISK patient (e.g., weak immune system, age > 68 years, obesity with BMI 30 or higher, pregnant, chronic lung disease or other chronic medical condition) AND [2] COVID symptoms (e.g., cough, fever)  (Exceptions: Already seen by PCP and no new or worsening symptoms.)  Answer Assessment - Initial Assessment Questions 1. COVID-19 DIAGNOSIS: "How do you know that you have COVID?" (e.g., positive lab test or self-test, diagnosed by doctor or NP/PA, symptoms after exposure).     Home, yesterday 2. COVID-19 EXPOSURE: "Was there any known exposure to COVID before the symptoms began?" CDC Definition of close contact: within 6 feet (2 meters) for a total of 15 minutes or more over a 24-hour period.       3. ONSET: "When did the COVID-19 symptoms start?"      Yesterday AM 4. WORST SYMPTOM: "What is your worst symptom?" (e.g., cough, fever, shortness of breath, muscle aches)      5. COUGH: "Do you have a cough?" If Yes, ask: "How bad is the cough?"       Cough, congestion. Productive , clear, yellow at times 6. FEVER: "Do you have a fever?" If Yes, ask: "What is your temperature,  how was it measured, and when did it start?"  99.8    7. RESPIRATORY STATUS: "Describe your breathing?" (e.g., normal; shortness of breath, wheezing, unable to speak)      no 9. OTHER SYMPTOMS: "Do you have any other symptoms?"  (e.g., chills, fatigue, headache, loss of smell or taste, muscle pain, sore throat)     Body aches,slight headache 10. HIGH RISK DISEASE: "Do you have any chronic medical problems?" (e.g., asthma, heart or lung disease, weak immune system, obesity, etc.)       Yes 11. VACCINE: "Have you had the COVID-19 vaccine?" If Yes, ask: "Which one, how many shots, when did you get it?" All up until 2022, last booster  Protocols used: Coronavirus (COVID-19) Diagnosed or Suspected-A-AH

## 2022-05-15 ENCOUNTER — Telehealth: Payer: Self-pay

## 2022-05-15 NOTE — Telephone Encounter (Unsigned)
Copied from [place] [phone]. Topic: Quick Communication - Home Health Verbal Orders >> May 15, 2022  1:04 PM Ludger Nutting wrote: Home Health Verbal Orders - Caller/Agency: Melissa / [place] Requesting OT/PT/Skilled Nursing/Social Work/Speech Therapy: Speech Therapy Frequency: 1w6

## 2022-05-16 ENCOUNTER — Other Ambulatory Visit: Payer: Self-pay | Admitting: Internal Medicine

## 2022-05-16 NOTE — Telephone Encounter (Signed)
Future visit in 2 weeks.  Requested Prescriptions  Pending Prescriptions Disp Refills   rosuvastatin (CRESTOR) 20 MG tablet [Pharmacy Med Name: ROSUVASTATIN CALCIUM 20 MG TAB] 90 tablet 0    Sig: TAKE 1 TABLET BY MOUTH EVERY DAY     Cardiovascular:  Antilipid - Statins 2 Failed - 05/16/2022  4:46 AM      Failed - Valid encounter within last 12 months    Recent Outpatient Visits           10 months ago Generalized weakness   [facility] [place], Coralie Keens, NP   1 year ago Aortic atherosclerosis [facility])   [facility] [place], Coralie Keens, NP       Future Appointments             In 2 weeks [name], NP [facility]            Failed - Lipid Panel in normal range within the last 12 months    Cholesterol, Total  Date Value Ref Range Status  08/27/2019 107 100 - 199 mg/dL Final   Cholesterol  Date Value Ref Range Status  03/11/2022 115 0 - 200 mg/dL Final   LDL Cholesterol (Calc)  Date Value Ref Range Status  01/23/2021 75 mg/dL (calc) Final    Comment:    Reference range: <100 . Desirable range <100 mg/dL for primary prevention;   <70 mg/dL for patients with CHD or diabetic patients  with > or = 2 CHD risk factors. Marland Kitchen LDL-C is now calculated using the Martin-Hopkins  calculation, which is a validated novel method providing  better accuracy than the Friedewald equation in the  estimation of LDL-C.  Cresenciano Genre et al. Annamaria Helling. 4097;353(29): 2061-2068  (http://education.[employer].com/faq/FAQ164)    LDL Cholesterol  Date Value Ref Range Status  03/11/2022 60 0 - 99 mg/dL Final    Comment:           Total Cholesterol/HDL:CHD Risk Coronary Heart Disease Risk Table                     Men   Women  1/2 Average Risk   3.4   3.3  Average Risk       5.0   4.4  2 X Average Risk   9.6   7.1  3 X Average Risk  23.4   11.0        Use the calculated Patient Ratio above and the CHD Risk  Table to determine the patient's CHD Risk.        ATP III CLASSIFICATION (LDL):  <100     mg/dL   Optimal  100-129  mg/dL   Near or Above                    Optimal  130-159  mg/dL   Borderline  160-189  mg/dL   High  >190     mg/dL   Very High Performed at [facility], [address] [place], [place] [address]    Direct LDL  Date Value Ref Range Status  06/17/2017 45.0 mg/dL Final    Comment:    Optimal:  <100 mg/dLNear or Above Optimal:  100-129 mg/dLBorderline High:  130-159 mg/dLHigh:  160-189 mg/dLVery High:  >190 mg/dL   HDL  Date Value Ref Range Status  03/11/2022 43 >40 mg/dL Final  08/27/2019 35 (L) >39 mg/dL Final  Triglycerides  Date Value Ref Range Status  03/11/2022 58 <150 mg/dL Final         Passed - Cr in normal range and within 360 days    Creat  Date Value Ref Range Status  01/23/2021 0.89 0.70 - 1.28 mg/dL Final   Creatinine, Ser  Date Value Ref Range Status  03/10/2022 0.94 0.61 - 1.24 mg/dL Final  03/10/2022 0.89 0.61 - 1.24 mg/dL Final         Passed - Patient is not pregnant

## 2022-05-16 NOTE — Telephone Encounter (Signed)
Left message advising Melissa.   Also schedule an appointment for 05/30/2022.    Thanks,   -Mickel Baas

## 2022-05-16 NOTE — Telephone Encounter (Signed)
Okay for speech therapy as requested.  He is past due for an appointment with me, needs to get this scheduled.

## 2022-05-23 ENCOUNTER — Ambulatory Visit (INDEPENDENT_AMBULATORY_CARE_PROVIDER_SITE_OTHER): Payer: Medicare Other | Admitting: Internal Medicine

## 2022-05-23 ENCOUNTER — Encounter: Payer: Self-pay | Admitting: Internal Medicine

## 2022-05-23 VITALS — BP 108/64 | HR 70 | Temp 96.8°F | Wt 142.0 lb

## 2022-05-23 DIAGNOSIS — I700 Atherosclerosis of aorta: Secondary | ICD-10-CM | POA: Diagnosis not present

## 2022-05-23 DIAGNOSIS — H109 Unspecified conjunctivitis: Secondary | ICD-10-CM

## 2022-05-23 DIAGNOSIS — I7121 Aneurysm of the ascending aorta, without rupture: Secondary | ICD-10-CM | POA: Diagnosis not present

## 2022-05-23 DIAGNOSIS — I639 Cerebral infarction, unspecified: Secondary | ICD-10-CM | POA: Diagnosis not present

## 2022-05-23 DIAGNOSIS — R053 Chronic cough: Secondary | ICD-10-CM

## 2022-05-23 DIAGNOSIS — G459 Transient cerebral ischemic attack, unspecified: Secondary | ICD-10-CM

## 2022-05-23 DIAGNOSIS — I69351 Hemiplegia and hemiparesis following cerebral infarction affecting right dominant side: Secondary | ICD-10-CM

## 2022-05-23 DIAGNOSIS — D508 Other iron deficiency anemias: Secondary | ICD-10-CM

## 2022-05-23 DIAGNOSIS — K219 Gastro-esophageal reflux disease without esophagitis: Secondary | ICD-10-CM

## 2022-05-23 DIAGNOSIS — F01B0 Vascular dementia, moderate, without behavioral disturbance, psychotic disturbance, mood disturbance, and anxiety: Secondary | ICD-10-CM

## 2022-05-23 MED ORDER — POLYMYXIN B-TRIMETHOPRIM 10000-0.1 UNIT/ML-% OP SOLN: 1.0000 [drp] | OPHTHALMIC | 0 refills | Status: DC

## 2022-05-23 NOTE — Assessment & Plan Note (Signed)
Lipid profile reviewed Continue rosuvastatin, aspirin and Plavix

## 2022-05-23 NOTE — Progress Notes (Signed)
Subjective:    Patient ID: Christopher Murphy, male    DOB: [birth year], 77 y.o.   MRN: [id number]  HPI  Patient presents to clinic today for follow-up of chronic conditions.  HLD with Aortic Atherosclerosis, History of TIA: Mainly cognitive.  His last LDL was 60, triglycerides 58, 03/2022.  He denies myalgias on Rosuvastatin.  He is taking ASA and Plavix as well.  He follows with neurology.  Thoracic Aortic Aneurysm: CT of the chest from 04/2020 reviewed.  He does not follow with vascular.  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  He is taking Donepezil and Memantine as prescribed.  He follows with neurology.  Chronic Cough: Intermittent.  Managed with Albuterol as needed.  There are no PFTs on file.  Anemia: His last H/H was 12.3/38.1, 03/2022.  He is not taking any oral iron at this time.  He does not follow with hematology.  GERD: He denies breakthrough on Pantoprazole. Upper GI from 04/2019 reviewed.  Review of Systems     Past Medical History:  Diagnosis Date   Blood clots in brain 2009   Stroke [facility])     Current Outpatient Medications  Medication Sig Dispense Refill   albuterol (VENTOLIN HFA) 108 (90 Base) MCG/ACT inhaler Inhale 2 puffs into the lungs every 6 (six) hours as needed for wheezing or shortness of breath. 8 g 0   ASPIRIN LOW DOSE 81 MG EC tablet TAKE 1 TABLET BY MOUTH EVERY DAY 90 tablet 1   camphor-menthol (SARNA) lotion Apply 1 application topically as needed for itching. 222 mL 2   cetirizine (ZYRTEC) 10 MG tablet Take 1 tablet (10 mg total) by mouth daily. (Patient taking differently: Take 10 mg by mouth daily as needed for allergies or rhinitis.) 30 tablet 11   donepezil (ARICEPT) 10 MG tablet TAKE 1 TABLET BY MOUTH EVERYDAY AT BEDTIME 90 tablet 1   memantine (NAMENDA) 10 MG tablet TAKE 1 TABLET BY MOUTH TWICE A DAY 180 tablet 4   pantoprazole (PROTONIX) 40 MG tablet TAKE 1 TABLET BY MOUTH EVERY DAY 90 tablet 1   Polyethyl Glycol-Propyl Glycol (SYSTANE)  0.4-0.3 % SOLN Apply 1 drop to eye 2 (two) times daily. (Patient taking differently: Place 1 drop into both eyes 2 (two) times daily.) 30 mL 5   polyethylene glycol (MIRALAX / GLYCOLAX) 17 g packet Take 17 g by mouth daily. 14 each 0   rosuvastatin (CRESTOR) 20 MG tablet TAKE 1 TABLET BY MOUTH EVERY DAY 90 tablet 0   No current facility-administered medications for this visit.    Not on File  Family History  Problem Relation Age of Onset   AAA (abdominal aortic aneurysm) Neg Hx     Social History   Socioeconomic History   Marital status: Married    Spouse name: Not on file   Number of children: Not on file   Years of education: Not on file   Highest education level: Not on file  Occupational History   Not on file  Tobacco Use   Smoking status: Never   Smokeless tobacco: Never  Vaping Use   Vaping Use: Never used  Substance and Sexual Activity   Alcohol use: No   Drug use: No   Sexual activity: Not on file  Other Topics Concern   Not on file  Social History Narrative   Lives with wife, son and daughter in law + 2 children   Right Handed   Drinks no caffeine   Social Determinants of Health  Financial Resource Strain: Not on file  Food Insecurity: No Food Insecurity (03/12/2022)   Hunger Vital Sign    Worried About Running Out of Food in the Last Year: Never true    Ran Out of Food in the Last Year: Never true  Transportation Needs: No Transportation Needs (03/12/2022)   PRAPARE - Hydrologist (Medical): No    Lack of Transportation (Non-Medical): No  Physical Activity: Not on file  Stress: Not on file  Social Connections: Not on file  Intimate Partner Violence: Not At Risk (03/12/2022)   Humiliation, Afraid, Rape, and Kick questionnaire    Fear of Current or Ex-Partner: No    Emotionally Abused: No    Physically Abused: No    Sexually Abused: No     Constitutional: Denies fever, malaise, fatigue, headache or abrupt weight changes.   HEENT: Patient reports eye discharge.  Denies eye pain, eye redness, ear pain, ringing in the ears, wax buildup, runny nose, nasal congestion, bloody nose, or sore throat. Respiratory: Patient reports intermittent cough.  Denies difficulty breathing, shortness of breath, or sputum production.   Cardiovascular: Denies chest pain, chest tightness, palpitations or swelling in the hands or feet.  Gastrointestinal: Patient reports intermittent constipation.  Denies abdominal pain, bloating, constipation, diarrhea or blood in the stool.  GU: Denies urgency, frequency, pain with urination, burning sensation, blood in urine, odor or discharge. Musculoskeletal: Patient reports intermittent weakness, difficulty with gait.  Denies decrease in range of motion, muscle pain or joint pain and swelling.  Skin: Denies redness, rashes, lesions or ulcercations.  Neurological: Patient reports difficulty with memory, difficulty with balance and coordination.Denies dizziness, difficulty with speech.  Psych: Denies anxiety, depression, SI/HI.  No other specific complaints in a complete review of systems (except as listed in HPI above).  Objective:   Physical Exam   BP 108/64 (BP Location: Left Arm, Patient Position: Sitting, Cuff Size: Normal)   Pulse 70   Temp (!) 96.8 F (36 C) (Temporal)   Wt 142 lb (64.4 kg)   SpO2 100%   BMI 21.59 kg/m   Wt Readings from Last 3 Encounters:  03/12/22 123 lb 3.8 oz (55.9 kg)  08/09/21 136 lb 8 oz (61.9 kg)  07/19/21 131 lb (59.4 kg)    General: Appears his stated age, well developed, well nourished in NAD. Skin: Warm, dry and intact.  HEENT: Head: normal shape and size; Eyes: sclera white, no icterus, conjunctiva erythematous, discharge in the conjunctival sac;  Cardiovascular: Normal rate and rhythm. S1,S2 noted.  No murmur, rubs or gallops noted. No JVD or BLE edema. No carotid bruits noted. Pulmonary/Chest: Normal effort and positive vesicular breath sounds. No  respiratory distress. No wheezes, rales or ronchi noted.  Abdomen: Soft and nontender. Normal bowel sounds.  Musculoskeletal: Gait slow, unsteady with use of walking stick. Neurological: Alert. Coordination normal.  Psychiatric: Mood and affect normal. Behavior is normal. Judgment and thought content normal.     BMET    Component Value Date/Time   NA 139 03/10/2022 1455   K 4.2 03/10/2022 1455   CL 108 03/10/2022 1455   CO2 28 03/10/2022 1455   GLUCOSE 100 (H) 03/10/2022 1455   BUN 12 03/10/2022 1455   CREATININE 0.94 03/10/2022 1455   CREATININE 0.89 03/10/2022 1455   CREATININE 0.89 01/23/2021 1042   CALCIUM 8.7 (L) 03/10/2022 1455   GFRNONAA >60 03/10/2022 1455   GFRNONAA >60 03/10/2022 1455   GFRNONAA 83 02/21/2015 1008  GFRAA >60 10/28/2019 0905   GFRAA >89 02/21/2015 1008    Lipid Panel     Component Value Date/Time   CHOL 115 03/11/2022 0302   CHOL 107 08/27/2019 1544   TRIG 58 03/11/2022 0302   HDL 43 03/11/2022 0302   HDL 35 (L) 08/27/2019 1544   CHOLHDL 2.7 03/11/2022 0302   VLDL 12 03/11/2022 0302   LDLCALC 60 03/11/2022 0302   LDLCALC 75 01/23/2021 1042    CBC    Component Value Date/Time   WBC 6.5 03/10/2022 1455   RBC 4.55 03/10/2022 1455   HGB 12.3 (L) 03/10/2022 1455   HCT 38.1 (L) 03/10/2022 1455   PLT 225 03/10/2022 1455   MCV 83.7 03/10/2022 1455   MCH 27.0 03/10/2022 1455   MCHC 32.3 03/10/2022 1455   RDW 14.3 03/10/2022 1455   LYMPHSABS 1.8 03/10/2022 1455   MONOABS 0.5 03/10/2022 1455   EOSABS 0.1 03/10/2022 1455   BASOSABS 0.0 03/10/2022 1455    Hgb A1C Lab Results  Component Value Date   HGBA1C 5.6 03/10/2022           Assessment & Plan:   Bacterial Conjunctivitis:  Rx for Polytrim eyedrops x 5 days Warm compresses 3 times daily as needed  RTC in 6 months for follow-up of chronic conditions Webb Silversmith, NP

## 2022-05-23 NOTE — Assessment & Plan Note (Signed)
Continue albuterol as needed 

## 2022-05-23 NOTE — Patient Instructions (Signed)
Bacterial Conjunctivitis, Adult Bacterial conjunctivitis is an infection of your conjunctiva. This is the clear membrane that covers the white part of your eye and the inner part of your eyelid. This infection can make your eye: Red or pink. Itchy or irritated. This condition spreads easily from person to person (is contagious) and from one eye to the other eye. What are the causes? This condition is caused by germs (bacteria). You may get the infection if you come into close contact with: A person who has the infection. Items that have germs on them (are contaminated), such as face towels, contact lens solution, or eye makeup. What increases the risk? You are more likely to get this condition if: You have contact with people who have the infection. You wear contact lenses. You have a sinus infection. You have had a recent eye injury or surgery. You have a weak body defense system (immune system). You have dry eyes. What are the signs or symptoms?  Thick, yellowish discharge from the eye. Tearing or watery eyes. Itchy eyes. Burning feeling in your eyes. Eye redness. Swollen eyelids. Blurred vision. How is this treated?  Antibiotic eye drops or ointment. Antibiotic medicine taken by mouth. This is used for infections that do not get better with drops or ointment or that last more than 10 days. Cool, wet cloths placed on the eyes. Artificial tears used 2-6 times a day. Follow these instructions at home: Medicines Take or apply your antibiotic medicine as told by your doctor. Do not stop using it even if you start to feel better. Take or apply over-the-counter and prescription medicines only as told by your doctor. Do not touch your eyelid with the eye-drop bottle or the ointment tube. Managing discomfort Wipe any fluid from your eye with a warm, wet washcloth or a cotton ball. Place a clean, cool, wet cloth on your eye. Do this for 10-20 minutes, 3-4 times a day. General  instructions Do not wear contacts until the infection is gone. Wear glasses until your doctor says it is okay to wear contacts again. Do not wear eye makeup until the infection is gone. Throw away old eye makeup. Change or wash your pillowcase every day. Do not share towels or washcloths. Wash your hands often with soap and water for at least 20 seconds and especially before touching your face or eyes. Use paper towels to dry your hands. Do not touch or rub your eyes. Do not drive or use heavy machinery if your vision is blurred. Contact a doctor if: You have a fever. You do not get better after 10 days. Get help right away if: You have a fever and your symptoms get worse all of a sudden. You have very bad pain when you move your eye. Your face: Hurts. Is red. Is swollen. You have sudden loss of vision. Summary Bacterial conjunctivitis is an infection of your conjunctiva. This infection spreads easily from person to person. Wash your hands often with soap and water for at least 20 seconds and especially before touching your face or eyes. Use paper towels to dry your hands. Take or apply your antibiotic medicine as told by your doctor. Contact a doctor if you have a fever or you do not get better after 10 days. This information is not intended to replace advice given to you by your health care provider. Make sure you discuss any questions you have with your health care provider. Document Revised: 07/06/2020 Document Reviewed: 07/06/2020 Elsevier Patient Education    2023 Elsevier Inc.  

## 2022-05-23 NOTE — Assessment & Plan Note (Signed)
Continue donepezil and memantine He will continue to follow with neurology

## 2022-05-23 NOTE — Assessment & Plan Note (Signed)
CBC reviewed 

## 2022-05-23 NOTE — Assessment & Plan Note (Signed)
Continue pantoprazole. °

## 2022-05-23 NOTE — Assessment & Plan Note (Signed)
They do not want to follow-up with CT chest or vascular at this time

## 2022-05-27 ENCOUNTER — Telehealth: Payer: Medicare Other | Admitting: Family

## 2022-05-27 DIAGNOSIS — J208 Acute bronchitis due to other specified organisms: Secondary | ICD-10-CM | POA: Diagnosis not present

## 2022-05-27 DIAGNOSIS — B9689 Other specified bacterial agents as the cause of diseases classified elsewhere: Secondary | ICD-10-CM

## 2022-05-27 MED ORDER — BENZONATATE 100 MG PO CAPS: 100.0000 mg | ORAL_CAPSULE | Freq: Three times a day (TID) | ORAL | 0 refills | Status: DC | PRN

## 2022-05-27 MED ORDER — DOXYCYCLINE HYCLATE 100 MG PO TABS: 100.0000 mg | ORAL_TABLET | Freq: Two times a day (BID) | ORAL | 0 refills | Status: DC

## 2022-05-27 NOTE — Progress Notes (Signed)

## 2022-05-29 ENCOUNTER — Telehealth: Payer: Self-pay

## 2022-05-29 NOTE — Telephone Encounter (Signed)
Copied from [place] [id number]. Topic: General - Other >> May 29, 2022 10:51 AM Everette C wrote: Reason for CRM: anda with Christopher Murphy has called to follow up on an outstanding order for skilled nursing to adjust remaining schedule  The order was originally submitted on 04/27/22 order number  [id number]  Christopher Murphy would like to be contacted by a member of staff to confirm receipt as well as discuss compliance   Please fax signed order back to [facility] at [phone]  Please contact further when possible

## 2022-05-29 NOTE — Telephone Encounter (Signed)
Can you call the family and see if they want skilled nursing?  It was my impression that his wife was doing all his personal care.

## 2022-05-29 NOTE — Telephone Encounter (Signed)
LMTCB 05/29/2022.  PEC please advise with pt's daughter calls back.   Thanks,   -Mickel Baas

## 2022-05-29 NOTE — Telephone Encounter (Signed)
Pt's daughter in law called to report that the patient does not need skilled nursing

## 2022-05-30 ENCOUNTER — Ambulatory Visit: Payer: Medicare Other | Admitting: Internal Medicine

## 2022-05-30 NOTE — Telephone Encounter (Signed)
Please call by Christopher Murphy back and let them know that family does not want skilled nursing.

## 2022-06-01 NOTE — Telephone Encounter (Signed)
Spoke with Mariann Laster from [facility], she states the order was for speech therapy only not skilled nursing.  She states she has already received the orders for speech therapy.    Thanks,   -Mickel Baas

## 2022-06-13 ENCOUNTER — Telehealth: Payer: Medicare Other | Admitting: Physician Assistant

## 2022-06-13 DIAGNOSIS — B9689 Other specified bacterial agents as the cause of diseases classified elsewhere: Secondary | ICD-10-CM

## 2022-06-13 NOTE — Progress Notes (Signed)
Because you have failed first-line treatments and are still having symptoms, I feel your condition warrants further evaluation and I recommend that you be seen in a face to face visit. You should be evaluated in person so your lungs can be listened to and considerations for a possible chest x-ray to rule out pneumonia.    NOTE: There will be NO CHARGE for this eVisit   If you are having a true medical emergency please call 911.      For an urgent face to face visit, [facility] has eight urgent care centers for your convenience:   NEW!! [facility] [place] [facility] [place] Get Driving Directions [id number] [address] [place], [place] [facility] [place] [facility] [place] Get Driving Directions [id number] [place], [place] [address]   [facility] [place] [facility]) Get Driving Directions [id number] [address], [place] [address]  [facility] [place] ([place]) Get Driving Directions [id number] [address] [place],  [place]  [address]  [facility] [facility] - at [place] Get Driving Directions  [phone] [address] [place],  [place] [address]   [facility] Get Driving Directions [id number] [place], [place], [place] [address]   [facility] Get Driving Directions  [id number] [address], [place] [address]   [facility] at  Get Driving Directions [id number] [address], [place], [place] [address]  Your MyChart E-visit questionnaire answers were reviewed by a board certified advanced clinical practitioner to complete your personal care plan based on your specific symptoms.  Thank you for using e-Visits.    I have spent 5 minutes in  review of e-visit questionnaire, review and updating patient chart, medical decision making and response to patient.   Mar Daring, PA-C

## 2022-06-14 ENCOUNTER — Encounter: Payer: Self-pay | Admitting: Internal Medicine

## 2022-06-14 ENCOUNTER — Ambulatory Visit (INDEPENDENT_AMBULATORY_CARE_PROVIDER_SITE_OTHER): Payer: Medicare Other | Admitting: Internal Medicine

## 2022-06-14 VITALS — BP 124/80 | HR 68 | Temp 96.6°F

## 2022-06-14 DIAGNOSIS — J208 Acute bronchitis due to other specified organisms: Secondary | ICD-10-CM

## 2022-06-14 DIAGNOSIS — B9689 Other specified bacterial agents as the cause of diseases classified elsewhere: Secondary | ICD-10-CM

## 2022-06-14 MED ORDER — PREDNISONE 10 MG PO TABS: ORAL_TABLET | ORAL | 0 refills | Status: DC

## 2022-06-14 MED ORDER — PROMETHAZINE-DM 6.25-15 MG/5ML PO SYRP: 5.0000 mL | ORAL_SOLUTION | Freq: Four times a day (QID) | ORAL | 0 refills | Status: DC | PRN

## 2022-06-14 MED ORDER — AMOXICILLIN-POT CLAVULANATE 875-125 MG PO TABS: 1.0000 | ORAL_TABLET | Freq: Two times a day (BID) | ORAL | 0 refills | Status: DC

## 2022-06-14 NOTE — Patient Instructions (Signed)
  Acute Bronchitis, Adult  Acute bronchitis is when air tubes in the lungs (bronchi) suddenly get swollen. The condition can make it hard for you to breathe. In adults, acute bronchitis usually goes away within 2 weeks. A cough caused by bronchitis may last up to 3 weeks. Smoking, allergies, and asthma can make the condition worse. What are the causes? Germs that cause cold and flu (viruses). The most common cause of this condition is the virus that causes the common cold. Bacteria. Substances that bother (irritate) the lungs, including: Smoke from cigarettes and other types of tobacco. Dust and pollen. Fumes from chemicals, gases, or burned fuel. Indoor or outdoor air pollution. What increases the risk? A weak body's defense system. This is also called the immune system. Any condition that affects your lungs and breathing, such as asthma. What are the signs or symptoms? A cough. Coughing up clear, yellow, or green mucus. Making high-pitched whistling sounds when you breathe, most often when you breathe out (wheezing). Runny or stuffy nose. Having too much mucus in your lungs (chest congestion). Shortness of breath. Body aches. A sore throat. How is this treated? Acute bronchitis may go away over time without treatment. Your doctor may tell you to: Drink more fluids. This will help thin your mucus so it is easier to cough up. Use a device that gets medicine into your lungs (inhaler). Use a vaporizer or a humidifier. These are machines that add water to the air. This helps with coughing and poor breathing. Take a medicine that thins mucus and helps clear it from your lungs. Take a medicine that prevents or stops coughing. It is not common to take an antibiotic medicine for this condition. Follow these instructions at home:  Take over-the-counter and prescription medicines only as told by your doctor. Use an inhaler, vaporizer, or humidifier as told by your doctor. Take two  teaspoons (10 mL) of honey at bedtime. This helps lessen your coughing at night. Drink enough fluid to keep your pee (urine) pale yellow. Do not smoke or use any products that contain nicotine or tobacco. If you need help quitting, ask your doctor. Get a lot of rest. Return to your normal activities when your doctor says that it is safe. Keep all follow-up visits. How is this prevented?  Wash your hands often with soap and water for at least 20 seconds. If you cannot use soap and water, use hand sanitizer. Avoid contact with people who have cold symptoms. Try not to touch your mouth, nose, or eyes with your hands. Avoid breathing in smoke or chemical fumes. Make sure to get the flu shot every year. Contact a doctor if: Your symptoms do not get better in 2 weeks. You have trouble coughing up the mucus. Your cough keeps you awake at night. You have a fever. Get help right away if: You cough up blood. You have chest pain. You have very bad shortness of breath. You faint or keep feeling like you are going to faint. You have a very bad headache. Your fever or chills get worse. These symptoms may be an emergency. Get help right away. Call your local emergency services (911 in the [place] Do not wait to see if the symptoms will go away. Do not drive yourself to the hospital. Summary Acute bronchitis is when air tubes in the lungs (bronchi) suddenly get swollen. In adults, acute bronchitis usually goes away within 2 weeks. Drink more fluids. This will help thin your mucus so it   is easier to cough up. Take over-the-counter and prescription medicines only as told by your doctor. Contact a doctor if your symptoms do not improve after 2 weeks of treatment. This information is not intended to replace advice given to you by your health care provider. Make sure you discuss any questions you have with your health care provider. Document Revised: 07/27/2020 Document Reviewed: 07/27/2020 Elsevier  Patient Education  2023 Elsevier Inc.  

## 2022-06-14 NOTE — Progress Notes (Signed)
Subjective:    Patient ID: Christopher Murphy, male    DOB: 09-Sep-1945, 77 y.o.   MRN: [id number]  HPI  Patient presents to clinic today with complaint of fatigue, runny nose, nasal congestion, cough and wheezing.  This started 2 weeks ago.  He is blowing yellow mucus out of his nose.  The cough is productive of yellow mucus.  He denies ear pain, sore throat, shortness of breath, chest pain, vomiting or diarrhea.  He denies fever, chills or body aches but has been very fatigued.  He has a history of chronic cough, managed on Tessalon and Albuterol. He did an e-visit 2/18 for the same, diagnosed with bacterial bronchitis and recenltly completed a course of Doxycyline with minimal relief of symptoms.  Review of Systems     Past Medical History:  Diagnosis Date   Blood clots in brain 2009   Stroke [facility])     Current Outpatient Medications  Medication Sig Dispense Refill   albuterol (VENTOLIN HFA) 108 (90 Base) MCG/ACT inhaler Inhale 2 puffs into the lungs every 6 (six) hours as needed for wheezing or shortness of breath. 8 g 0   ASPIRIN LOW DOSE 81 MG EC tablet TAKE 1 TABLET BY MOUTH EVERY DAY 90 tablet 1   benzonatate (TESSALON PERLES) 100 MG capsule Take 1 capsule (100 mg total) by mouth 3 (three) times daily as needed. 20 capsule 0   camphor-menthol (SARNA) lotion Apply 1 application topically as needed for itching. 222 mL 2   cetirizine (ZYRTEC) 10 MG tablet Take 1 tablet (10 mg total) by mouth daily. (Patient taking differently: Take 10 mg by mouth daily as needed for allergies or rhinitis.) 30 tablet 11   donepezil (ARICEPT) 10 MG tablet TAKE 1 TABLET BY MOUTH EVERYDAY AT BEDTIME 90 tablet 1   doxycycline (VIBRA-TABS) 100 MG tablet Take 1 tablet (100 mg total) by mouth 2 (two) times daily. 20 tablet 0   memantine (NAMENDA) 10 MG tablet TAKE 1 TABLET BY MOUTH TWICE A DAY 180 tablet 4   pantoprazole (PROTONIX) 40 MG tablet TAKE 1 TABLET BY MOUTH EVERY DAY 90 tablet 1   Polyethyl  Glycol-Propyl Glycol (SYSTANE) 0.4-0.3 % SOLN Apply 1 drop to eye 2 (two) times daily. (Patient taking differently: Place 1 drop into both eyes 2 (two) times daily.) 30 mL 5   polyethylene glycol (MIRALAX / GLYCOLAX) 17 g packet Take 17 g by mouth daily. 14 each 0   rosuvastatin (CRESTOR) 20 MG tablet TAKE 1 TABLET BY MOUTH EVERY DAY 90 tablet 0   trimethoprim-polymyxin b (POLYTRIM) ophthalmic solution Place 1 drop into both eyes every 4 (four) hours. 10 mL 0   No current facility-administered medications for this visit.    No Known Allergies  Family History  Problem Relation Age of Onset   AAA (abdominal aortic aneurysm) Neg Hx     Social History   Socioeconomic History   Marital status: Married    Spouse name: Not on file   Number of children: Not on file   Years of education: Not on file   Highest education level: Not on file  Occupational History   Not on file  Tobacco Use   Smoking status: Never   Smokeless tobacco: Never  Vaping Use   Vaping Use: Never used  Substance and Sexual Activity   Alcohol use: No   Drug use: No   Sexual activity: Not on file  Other Topics Concern   Not on file  Social  History Narrative   Lives with wife, son and daughter in law + 2 children   Right Handed   Drinks no caffeine   Social Determinants of Health   Financial Resource Strain: Not on file  Food Insecurity: No Food Insecurity (03/12/2022)   Hunger Vital Sign    Worried About Running Out of Food in the Last Year: Never true    Ran Out of Food in the Last Year: Never true  Transportation Needs: No Transportation Needs (03/12/2022)   PRAPARE - Hydrologist (Medical): No    Lack of Transportation (Non-Medical): No  Physical Activity: Not on file  Stress: Not on file  Social Connections: Not on file  Intimate Partner Violence: Not At Risk (03/12/2022)   Humiliation, Afraid, Rape, and Kick questionnaire    Fear of Current or Ex-Partner: No     Emotionally Abused: No    Physically Abused: No    Sexually Abused: No     Constitutional: Patient reports fatigue.  Denies fever, malaise, headache or abrupt weight changes.  HEENT: Patient reports runny nose and nasal congestion.  Denies eye pain, eye redness, ear pain, ringing in the ears, wax buildup, bloody nose, or sore throat. Respiratory: Patient reports cough.  Denies difficulty breathing, shortness of breath.   Cardiovascular: Denies chest pain, chest tightness, palpitations or swelling in the hands or feet.  Gastrointestinal: Denies abdominal pain, bloating, constipation, diarrhea or blood in the stool.   No other specific complaints in a complete review of systems (except as listed in HPI above).  Objective:   Physical Exam   BP 124/80 (BP Location: Left Arm, Patient Position: Sitting, Cuff Size: Normal)   Pulse 68   Temp (!) 96.6 F (35.9 C) (Temporal)   SpO2 98%   Wt Readings from Last 3 Encounters:  05/23/22 142 lb (64.4 kg)  03/12/22 123 lb 3.8 oz (55.9 kg)  08/09/21 136 lb 8 oz (61.9 kg)    General: Appears his stated age, appears unwell but in NAD. HEENT: Head: normal shape and size, no sinus tenderness noted; Eyes: sclera white, no icterus, conjunctiva pink, PERRLA and EOMs intact; Throat/Mouth: Teeth present, mucosa pink and moist,, + PND, no exudate, lesions or ulcerations noted.  Neck: No adenopathy noted. Cardiovascular: Normal rate and rhythm. S1,S2 noted.  No murmur, rubs or gallops noted.  Pulmonary/Chest: Normal effort and positive vesicular breath sounds. No respiratory distress. No wheezes, rales or ronchi noted.  Neurological: Alert.   BMET    Component Value Date/Time   NA 139 03/10/2022 1455   K 4.2 03/10/2022 1455   CL 108 03/10/2022 1455   CO2 28 03/10/2022 1455   GLUCOSE 100 (H) 03/10/2022 1455   BUN 12 03/10/2022 1455   CREATININE 0.94 03/10/2022 1455   CREATININE 0.89 03/10/2022 1455   CREATININE 0.89 01/23/2021 1042   CALCIUM 8.7  (L) 03/10/2022 1455   GFRNONAA >60 03/10/2022 1455   GFRNONAA >60 03/10/2022 1455   GFRNONAA 83 02/21/2015 1008   GFRAA >60 10/28/2019 0905   GFRAA >89 02/21/2015 1008    Lipid Panel     Component Value Date/Time   CHOL 115 03/11/2022 0302   CHOL 107 08/27/2019 1544   TRIG 58 03/11/2022 0302   HDL 43 03/11/2022 0302   HDL 35 (L) 08/27/2019 1544   CHOLHDL 2.7 03/11/2022 0302   VLDL 12 03/11/2022 0302   LDLCALC 60 03/11/2022 0302   LDLCALC 75 01/23/2021 1042    CBC  Component Value Date/Time   WBC 6.5 03/10/2022 1455   RBC 4.55 03/10/2022 1455   HGB 12.3 (L) 03/10/2022 1455   HCT 38.1 (L) 03/10/2022 1455   PLT 225 03/10/2022 1455   MCV 83.7 03/10/2022 1455   MCH 27.0 03/10/2022 1455   MCHC 32.3 03/10/2022 1455   RDW 14.3 03/10/2022 1455   LYMPHSABS 1.8 03/10/2022 1455   MONOABS 0.5 03/10/2022 1455   EOSABS 0.1 03/10/2022 1455   BASOSABS 0.0 03/10/2022 1455    Hgb A1C Lab Results  Component Value Date   HGBA1C 5.6 03/10/2022           Assessment & Plan:   Bacterial Bronchitis:  Unable to do x-ray as we do not have x-ray techs in the office although his lung exam is clear today Rx for Pred taper x 6 days Rx for Augmentin 875-125 mg twice daily x 10 days Rx for Promethazine DM cough syrup I can order a chest x-ray to be done at the hospital if symptoms persist or worsen  RTC in 5 months for follow-up of chronic conditions Webb Silversmith, NP

## 2022-06-18 ENCOUNTER — Emergency Department: Payer: Medicare Other

## 2022-06-18 ENCOUNTER — Other Ambulatory Visit: Payer: Self-pay

## 2022-06-18 ENCOUNTER — Encounter: Payer: Self-pay | Admitting: *Deleted

## 2022-06-18 ENCOUNTER — Inpatient Hospital Stay
Admission: EM | Admit: 2022-06-18 | Discharge: 2022-06-26 | DRG: 481 | Disposition: A | Discharge status: Home Health | Payer: Medicare Other | Attending: Internal Medicine | Admitting: Internal Medicine

## 2022-06-18 DIAGNOSIS — S72002A Fracture of unspecified part of neck of left femur, initial encounter for closed fracture: Secondary | ICD-10-CM | POA: Diagnosis present

## 2022-06-18 DIAGNOSIS — F0150 Vascular dementia without behavioral disturbance: Secondary | ICD-10-CM | POA: Diagnosis present

## 2022-06-18 DIAGNOSIS — I69351 Hemiplegia and hemiparesis following cerebral infarction affecting right dominant side: Secondary | ICD-10-CM

## 2022-06-18 DIAGNOSIS — D649 Anemia, unspecified: Secondary | ICD-10-CM | POA: Diagnosis present

## 2022-06-18 DIAGNOSIS — Z7982 Long term (current) use of aspirin: Secondary | ICD-10-CM

## 2022-06-18 DIAGNOSIS — W19XXXA Unspecified fall, initial encounter: Principal | ICD-10-CM

## 2022-06-18 DIAGNOSIS — S72142A Displaced intertrochanteric fracture of left femur, initial encounter for closed fracture: Principal | ICD-10-CM | POA: Diagnosis present

## 2022-06-18 DIAGNOSIS — I6930 Unspecified sequelae of cerebral infarction: Secondary | ICD-10-CM

## 2022-06-18 DIAGNOSIS — W010XXA Fall on same level from slipping, tripping and stumbling without subsequent striking against object, initial encounter: Secondary | ICD-10-CM | POA: Diagnosis present

## 2022-06-18 DIAGNOSIS — Z79899 Other long term (current) drug therapy: Secondary | ICD-10-CM

## 2022-06-18 DIAGNOSIS — I639 Cerebral infarction, unspecified: Secondary | ICD-10-CM

## 2022-06-18 DIAGNOSIS — Z1152 Encounter for screening for COVID-19: Secondary | ICD-10-CM

## 2022-06-18 DIAGNOSIS — F01B0 Vascular dementia, moderate, without behavioral disturbance, psychotic disturbance, mood disturbance, and anxiety: Secondary | ICD-10-CM | POA: Diagnosis present

## 2022-06-18 LAB — CBC WITH DIFFERENTIAL/PLATELET
Abs Immature Granulocytes: 0.12 10*3/uL — ABNORMAL HIGH (ref 0.00–0.07)
Basophils Absolute: 0 10*3/uL (ref 0.0–0.1)
Basophils Relative: 0 %
Eosinophils Absolute: 0 10*3/uL (ref 0.0–0.5)
Eosinophils Relative: 0 %
HCT: 36.3 % — ABNORMAL LOW (ref 39.0–52.0)
Hemoglobin: 11.8 g/dL — ABNORMAL LOW (ref 13.0–17.0)
Immature Granulocytes: 1 %
Lymphocytes Relative: 10 %
Lymphs Abs: 1.6 10*3/uL (ref 0.7–4.0)
MCH: 26.5 pg (ref 26.0–34.0)
MCHC: 32.5 g/dL (ref 30.0–36.0)
MCV: 81.4 fL (ref 80.0–100.0)
Monocytes Absolute: 1.1 10*3/uL — ABNORMAL HIGH (ref 0.1–1.0)
Monocytes Relative: 7 %
Neutro Abs: 13.7 10*3/uL — ABNORMAL HIGH (ref 1.7–7.7)
Neutrophils Relative %: 82 %
Platelets: 233 10*3/uL (ref 150–400)
RBC: 4.46 MIL/uL (ref 4.22–5.81)
RDW: 13.8 % (ref 11.5–15.5)
WBC: 16.5 10*3/uL — ABNORMAL HIGH (ref 4.0–10.5)
nRBC: 0 % (ref 0.0–0.2)

## 2022-06-18 LAB — COMPREHENSIVE METABOLIC PANEL
ALT: 28 U/L (ref 0–44)
AST: 28 U/L (ref 15–41)
Albumin: 3.2 g/dL — ABNORMAL LOW (ref 3.5–5.0)
Alkaline Phosphatase: 55 U/L (ref 38–126)
Anion gap: 6 (ref 5–15)
BUN: 17 mg/dL (ref 8–23)
CO2: 27 mmol/L (ref 22–32)
Calcium: 8.4 mg/dL — ABNORMAL LOW (ref 8.9–10.3)
Chloride: 105 mmol/L (ref 98–111)
Creatinine, Ser: 0.86 mg/dL (ref 0.61–1.24)
GFR, Estimated: >60 mL/min (ref >60)
Glucose, Bld: 103 mg/dL — ABNORMAL HIGH (ref 70–99)
Potassium: 3.9 mmol/L (ref 3.5–5.1)
Sodium: 138 mmol/L (ref 135–145)
Total Bilirubin: 0.5 mg/dL (ref 0.3–1.2)
Total Protein: 6.6 g/dL (ref 6.5–8.1)

## 2022-06-18 LAB — TYPE AND SCREEN

## 2022-06-18 LAB — PROTIME-INR
INR: 1.1 (ref 0.8–1.2)
Prothrombin Time: 14.2 seconds (ref 11.4–15.2)

## 2022-06-18 MED ORDER — SODIUM CHLORIDE 0.9 % IV SOLN: INTRAVENOUS | Status: DC

## 2022-06-18 MED ORDER — MORPHINE SULFATE (PF) 2 MG/ML IV SOLN
2.0000 mg | Freq: Once | INTRAVENOUS | Status: AC
  Administered 2022-06-18: 2 mg via INTRAVENOUS
  Filled 2022-06-18: qty 1

## 2022-06-18 MED ORDER — ONDANSETRON HCL 4 MG/2ML IJ SOLN
4.0000 mg | Freq: Once | INTRAMUSCULAR | Status: AC
  Administered 2022-06-18: 4 mg via INTRAVENOUS
  Filled 2022-06-18: qty 2

## 2022-06-18 MED ORDER — ACETAMINOPHEN 500 MG PO TABS
1000.0000 mg | ORAL_TABLET | Freq: Once | ORAL | Status: AC
  Administered 2022-06-18: 1000 mg via ORAL
  Filled 2022-06-18: qty 2

## 2022-06-18 NOTE — H&P (Signed)
History and Physical    Patient: Christopher Murphy DOB: 11/24/1945 DOA: 06/18/2022 DOS: the patient was seen and examined on 06/18/2022 PCP: [name], NP  Patient coming from: Home  Chief Complaint:  Chief Complaint  Patient presents with   Hip Pain    HPI: Christopher Murphy is a 77 y.o. 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  Patient was reportedly walking near the couch when he lost his footing falling onto his left hip,.  He did not hit his head nor lose consciousness.  He was in his usual state of health, having been at baseline from his recent stroke.  Son at bedside contributes to history and serves as interpreter ED course and data review: BP 166/86 with otherwise normal vitals.  Labs notable for leukocytosis of 16, and hemoglobin 11.8 down from a baseline of 12.3 about 3 months prior. Chest x-ray was clear, CT head nonacute, CT hip showed a[place] 500  Labs notable for leukocytosis of 16, and hemoglobin 11.8 down from a baseline of 12.3 about 3 months prior. Chest x-ray was clear, CT head nonacute, CT hip showed : IMPRESSION: 1. Comminuted minimally displaced basicervical and intertrochanteric left femoral neck fracture. Near anatomic alignment. 2. Abnormal appearance of the inferior margin of the iliacus muscle as it crosses the iliac bone, concerning for intramuscular hematoma or partial tear.  The ED provider: Spoke with orthopedist, Dr. Karel Jarvis who will take patient to the OR in the a.m.  Hospitalist consulted for admission.     Past Medical History:  Diagnosis Date   Blood clots in brain 2009   Stroke [facility])    Past Surgical History:  Procedure Laterality Date   COLONOSCOPY  WITH PROPOFOL N/A 04/30/2019   Procedure: COLONOSCOPY WITH PROPOFOL;  Surgeon: Jonathon Bellows, MD;  Location: [facility] ENDOSCOPY;  Service: Gastroenterology;  Laterality: N/A;   PROSTATECTOMY  1980   Social History:  reports that he has never smoked. He has never used smokeless tobacco. He reports that he does not drink alcohol and does not use drugs.  No Known Allergies  Family History  Problem Relation Age of Onset   AAA (abdominal aortic aneurysm) Neg Hx     Prior to Admission medications   Medication Sig Start Date End Date Taking? Authorizing Provider  amoxicillin-clavulanate (AUGMENTIN) 875-125 MG tablet Take 1 tablet by mouth 2 (two) times daily. 06/14/22  Yes [name], NP  ASPIRIN LOW DOSE 81 MG EC tablet TAKE 1 TABLET BY MOUTH EVERY DAY 08/10/21  Yes Baity, Coralie Keens, NP  donepezil (ARICEPT) 10 MG tablet TAKE 1 TABLET BY MOUTH EVERYDAY AT BEDTIME 11/13/21  Yes Garvin Fila, MD  memantine (NAMENDA) 10 MG tablet TAKE 1 TABLET BY MOUTH TWICE A DAY 07/06/21  Yes Garvin Fila, MD  pantoprazole (PROTONIX) 40 MG tablet TAKE 1 TABLET BY MOUTH EVERY DAY 01/15/22  Yes Baity, Coralie Keens, NP  Polyethyl Glycol-Propyl Glycol (SYSTANE) 0.4-0.3 % SOLN Apply 1 drop to eye 2 (two) times daily. Patient taking differently: Place 1 drop into both eyes 2 (two) times daily. 06/12/16  Yes Baity, Coralie Keens, NP  polyethylene glycol (MIRALAX / GLYCOLAX) 17 g packet Take 17 g by mouth daily. 05/14/20  Yes Ghimire, Henreitta Leber, MD  predniSONE (  DELTASONE) 10 MG tablet Take 6 tabs on day 1, 5 tabs on day 2, 4 tabs on day 3, 3 tabs on day 4, 2 tabs on day 5, 1 tab on day 6 06/14/22  Yes Baity, Coralie Keens, NP  rosuvastatin (CRESTOR) 20 MG tablet TAKE 1 TABLET BY MOUTH EVERY DAY 05/16/22  Yes Baity, Coralie Keens, NP  albuterol (VENTOLIN HFA) 108 (90 Base) MCG/ACT inhaler Inhale 2 puffs into the lungs every 6 (six) hours as needed for wheezing or shortness of breath. 06/22/21   Mar Daring, PA-C  benzonatate (TESSALON PERLES) 100  MG capsule Take 1 capsule (100 mg total) by mouth 3 (three) times daily as needed. 05/27/22   Sharion Balloon, FNP  camphor-menthol [facility]) lotion Apply 1 application topically as needed for itching. 06/29/20   [name], NP  cetirizine (ZYRTEC) 10 MG tablet Take 1 tablet (10 mg total) by mouth daily. Patient taking differently: Take 10 mg by mouth daily as needed for allergies or rhinitis. 06/11/17   [name], NP  promethazine-dextromethorphan (PROMETHAZINE-DM) 6.25-15 MG/5ML syrup Take 5 mLs by mouth 4 (four) times daily as needed. 06/14/22   [name], NP  trimethoprim-polymyxin b (POLYTRIM) ophthalmic solution Place 1 drop into both eyes every 4 (four) hours. Patient not taking: Reported on 06/18/2022 05/23/22   [name], NP    Physical Exam: Vitals:   06/18/22 2127  BP: (!) 166/86  Pulse: 61  Resp: 20  Temp: 97.6 F (36.4 C)  TempSrc: Oral  SpO2: 98%   Physical Exam Vitals and nursing note reviewed.  Constitutional:      General: He is not in acute distress. HENT:     Head: Normocephalic and atraumatic.  Cardiovascular:     Rate and Rhythm: Normal rate and regular rhythm.     Heart sounds: Normal heart sounds.  Pulmonary:     Effort: Pulmonary effort is normal.     Breath sounds: Normal breath sounds.  Abdominal:     Palpations: Abdomen is soft.     Tenderness: There is no abdominal tenderness.  Neurological:     Mental Status: Mental status is at baseline.     Labs on Admission: I have personally reviewed following labs and imaging studies  CBC: Recent Labs  Lab 06/18/22 2308  WBC 16.5*  NEUTROABS 13.7*  HGB 11.8*  HCT 36.3*  MCV 81.4  PLT 0000000   Basic Metabolic Panel: Recent Labs  Lab 06/18/22 2308  NA 138  K 3.9  CL 105  CO2 27  GLUCOSE 103*  BUN 17  CREATININE 0.86  CALCIUM 8.4*   GFR: CrCl cannot be calculated (Unknown ideal weight.). Liver Function Tests: Recent Labs  Lab 06/18/22 2308  AST 28  ALT 28  ALKPHOS 55   BILITOT 0.5  PROT 6.6  ALBUMIN 3.2*   No results for input(s): "LIPASE", "AMYLASE" in the last 168 hours. No results for input(s): "AMMONIA" in the last 168 hours. Coagulation Profile: Recent Labs  Lab 06/18/22 2308  INR 1.1   Cardiac Enzymes: No results for input(s): "CKTOTAL", "CKMB", "CKMBINDEX", "TROPONINI" in the last 168 hours. BNP (last 3 results) No results for input(s): "PROBNP" in the last 8760 hours. HbA1C: No results for input(s): "HGBA1C" in the last 72 hours. CBG: No results for input(s): "GLUCAP" in the last 168 hours. Lipid Profile: No results for input(s): "CHOL", "HDL", "LDLCALC", "TRIG", "CHOLHDL", "LDLDIRECT" in the last 72 hours. Thyroid Function Tests: No results for input(s): "  TSH", "T4TOTAL", "FREET4", "T3FREE", "THYROIDAB" in the last 72 hours. Anemia Panel: No results for input(s): "VITAMINB12", "FOLATE", "FERRITIN", "TIBC", "IRON", "RETICCTPCT" in the last 72 hours. Urine analysis:    Component Value Date/Time   COLORURINE COLORLESS (A) 03/10/2022 1903   APPEARANCEUR CLEAR (A) 03/10/2022 1903   LABSPEC 1.003 (L) 03/10/2022 1903   PHURINE 8.0 03/10/2022 1903   GLUCOSEU NEGATIVE 03/10/2022 1903   HGBUR MODERATE (A) 03/10/2022 1903   BILIRUBINUR NEGATIVE 03/10/2022 1903   BILIRUBINUR neg 11/13/2019 1741   KETONESUR NEGATIVE 03/10/2022 1903   PROTEINUR NEGATIVE 03/10/2022 1903   UROBILINOGEN 0.2 11/13/2019 1741   NITRITE NEGATIVE 03/10/2022 1903   LEUKOCYTESUR NEGATIVE 03/10/2022 1903    Radiological Exams on Admission: DG Chest Portable 1 View  Result Date: 06/18/2022 CLINICAL DATA:  Mild cough.  Preop EXAM: PORTABLE CHEST 1 VIEW COMPARISON:  03/10/2022 FINDINGS: Stable cardiomediastinal silhouette. No focal consolidation, pleural effusion, or pneumothorax. No acute osseous abnormality. IMPRESSION: No active disease. Electronically Signed   By: Placido Sou M.D.   On: 06/18/2022 23:14   CT Hip Left Wo Contrast  Result Date:  06/18/2022 CLINICAL DATA:  [place], left hip pain EXAM: CT OF THE LEFT HIP WITHOUT CONTRAST TECHNIQUE: Multidetector CT imaging of the left hip was performed according to the standard protocol. Multiplanar CT image reconstructions were also generated. RADIATION DOSE REDUCTION: This exam was performed according to the departmental dose-optimization program which includes automated exposure control, adjustment of the mA and/or kV according to patient size and/or use of iterative reconstruction technique. COMPARISON:  None Available. FINDINGS: Bones/Joint/Cartilage There is a minimally displaced and comminuted fracture through the basicervical and intertrochanteric regions of the left femoral neck. No significant impaction or angulation at the fracture site. Remaining visualized bony structures are unremarkable. Mild left hip joint space narrowing. Ligaments Suboptimally assessed by CT. Muscles and Tendons There is enlargement and increased attenuation within the inferior margin of the iliacus muscle as it crosses the left iliac bone, with edema in the surrounding fat planes. Findings are suspicious for underlying intramuscular hematoma or partial tear. Soft tissues Subcutaneous edema surrounding the left hip fracture. There is a fat containing left inguinal hernia. No bowel herniation. Reconstructed images demonstrate no additional findings. IMPRESSION: 1. Comminuted minimally displaced basicervical and intertrochanteric left femoral neck fracture. Near anatomic alignment. 2. Abnormal appearance of the inferior margin of the iliacus muscle as it crosses the iliac bone, concerning for intramuscular hematoma or partial tear. 3. Fat containing left inguinal hernia. Electronically Signed   By: Randa Ngo M.D.   On: 06/18/2022 22:27   CT HEAD WO CONTRAST (5MM)  Result Date: 06/18/2022 CLINICAL DATA:  Head trauma, minor (Age >= 65y) EXAM: CT HEAD WITHOUT CONTRAST TECHNIQUE: Contiguous axial images were obtained from  the base of the skull through the vertex without intravenous contrast. RADIATION DOSE REDUCTION: This exam was performed according to the departmental dose-optimization program which includes automated exposure control, adjustment of the mA and/or kV according to patient size and/or use of iterative reconstruction technique. COMPARISON:  MRI head 03/11/2022, CT angio head 03/11/2022 FINDINGS: Brain: Cerebral ventricle sizes are concordant with the degree of cerebral volume loss. Patchy and confluent areas of decreased attenuation are noted throughout the deep and periventricular white matter of the cerebral hemispheres bilaterally, compatible with chronic microvascular ischemic disease. Redemonstration of left occipital encephalomalacia as well as right parietal and left frontal lobes. No evidence of large-territorial acute infarction. No parenchymal hemorrhage. No mass lesion. No extra-axial collection. No mass  effect or midline shift. No hydrocephalus. Basilar cisterns are patent. Vascular: No hyperdense vessel. Skull: No acute fracture or focal lesion. Sinuses/Orbits: Mild left ethmoid sinus mucosal thickening. Paranasal sinuses and mastoid air cells are clear. Bilateral lens replacement. Otherwise the orbits are unremarkable. Other: None. IMPRESSION: No acute intracranial abnormality. Electronically Signed   By: Iven Finn M.D.   On: 06/18/2022 22:22     Data Reviewed: Relevant notes from primary care and specialist visits, past discharge summaries as available in EHR, including Care Everywhere. Prior diagnostic testing as pertinent to current admission diagnoses Updated medications and problem lists for reconciliation ED course, including vitals, labs, imaging, treatment and response to treatment Triage notes, nursing and pharmacy notes and ED provider's notes Notable results as noted in HPI   Assessment and Plan: * Closed left hip fracture ([place]) Possible hematoma/partial tear iliac [place]  muscle Mechanical fall Keep n.p.o. Orthopedics to take to the OR in the a.m. Pain management Low to moderate risk of perioperative cardiopulmonary complications based on patient not having any history of A-fib, CHF, CAD, syncope, COPD, OSA  Vascular dementia (HCC) Continue donepezil and Namenda Delirium precautions  History of recurrent CVA Hemiparesis right side Continue aspirin and rosuvastatin.   Patient last stroke was in December 2023          DVT prophylaxis: SCD  Consults: Ortho Dr Karel Jarvis  Advance Care Planning:   Code Status: Prior   Family Communication: Son at bedside  Disposition Plan: Back to previous home environment  Severity of Illness: The appropriate patient status for this patient is INPATIENT. Inpatient status is judged to be reasonable and necessary in order to provide the required intensity of service to ensure the patient's safety. The patient's presenting symptoms, physical exam findings, and initial radiographic and laboratory data in the context of their chronic comorbidities is felt to place them at high risk for further clinical deterioration. Furthermore, it is not anticipated that the patient will be medically stable for discharge from the hospital within 2 midnights of admission.   * I certify that at the point of admission it is my clinical judgment that the patient will require inpatient hospital care spanning beyond 2 midnights from the point of admission due to high intensity of service, high risk for further deterioration and high frequency of surveillance required.*  Author: Athena Masse, MD 06/18/2022 11:59 PM  For on call review [email or web address].

## 2022-06-18 NOTE — H&P (Incomplete)
History and Physical    Patient: Christopher Murphy DOB: 06/28/1945 DOA: 06/18/2022 DOS: the patient was seen and examined on 06/18/2022 PCP: [name], NP  Patient coming from: Home  Chief Complaint:  Chief Complaint  Patient presents with  . Hip Pain    HPI: Christopher Murphy is a 77 y.o. 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  Patient was reportedly walking near the couch when he lost his footing falling onto his left hip,.  He did not hit his head nor lose consciousness.  He was in his usual state of health, having been at baseline from his recent stroke. ED course and data review: BP 166/86 with otherwise normal vitals.  Labs notable for leukocytosis of 16, and hemoglobin 11.8 down from a baseline of 12.3 about 3 months prior. Chest x-ray was clear, CT head nonacute, CT hip showed a[place]ousand 500  Labs notable for leukocytosis of 16, and hemoglobin 11.8 down from a baseline of 12.3 about 3 months prior. Chest x-ray was clear, CT head nonacute, CT hip showed : IMPRESSION: 1. Comminuted minimally displaced basicervical and intertrochanteric left femoral neck fracture. Near anatomic alignment. 2. Abnormal appearance of the inferior margin of the iliacus muscle as it crosses the iliac bone, concerning for intramuscular hematoma or partial tear.  The ED provider: Spoke with orthopedist, Dr. Karel Jarvis who will take patient to the OR in the a.m.  Hospitalist consulted for admission.     Past Medical History:  Diagnosis Date  . Blood clots in brain 2009  . Stroke [facility])    Past Surgical History:  Procedure Laterality Date  . COLONOSCOPY WITH PROPOFOL N/A 04/30/2019   Procedure: COLONOSCOPY WITH  PROPOFOL;  Surgeon: Jonathon Bellows, MD;  Location: [facility] ENDOSCOPY;  Service: Gastroenterology;  Laterality: N/A;  . PROSTATECTOMY  1980   Social History:  reports that he has never smoked. He has never used smokeless tobacco. He reports that he does not drink alcohol and does not use drugs.  No Known Allergies  Family History  Problem Relation Age of Onset  . AAA (abdominal aortic aneurysm) Neg Hx     Prior to Admission medications   Medication Sig Start Date End Date Taking? Authorizing Provider  amoxicillin-clavulanate (AUGMENTIN) 875-125 MG tablet Take 1 tablet by mouth 2 (two) times daily. 06/14/22  Yes [name], NP  ASPIRIN LOW DOSE 81 MG EC tablet TAKE 1 TABLET BY MOUTH EVERY DAY 08/10/21  Yes Baity, Coralie Keens, NP  donepezil (ARICEPT) 10 MG tablet TAKE 1 TABLET BY MOUTH EVERYDAY AT BEDTIME 11/13/21  Yes Garvin Fila, MD  memantine (NAMENDA) 10 MG tablet TAKE 1 TABLET BY MOUTH TWICE A DAY 07/06/21  Yes Garvin Fila, MD  pantoprazole (PROTONIX) 40 MG tablet TAKE 1 TABLET BY MOUTH EVERY DAY 01/15/22  Yes Baity, Coralie Keens, NP  Polyethyl Glycol-Propyl Glycol (SYSTANE) 0.4-0.3 % SOLN Apply 1 drop to eye 2 (two) times daily. Patient taking differently: Place 1 drop into both eyes 2 (two) times daily. 06/12/16  Yes Baity, Coralie Keens, NP  polyethylene glycol (MIRALAX / GLYCOLAX) 17 g packet Take 17 g by mouth daily. 05/14/20  Yes Ghimire, Henreitta Leber, MD  predniSONE (DELTASONE) 10 MG tablet Take 6 tabs on day 1, 5  tabs on day 2, 4 tabs on day 3, 3 tabs on day 4, 2 tabs on day 5, 1 tab on day 6 06/14/22  Yes Baity, Coralie Keens, NP  rosuvastatin (CRESTOR) 20 MG tablet TAKE 1 TABLET BY MOUTH EVERY DAY 05/16/22  Yes Baity, Coralie Keens, NP  albuterol (VENTOLIN HFA) 108 (90 Base) MCG/ACT inhaler Inhale 2 puffs into the lungs every 6 (six) hours as needed for wheezing or shortness of breath. 06/22/21   Mar Daring, PA-C  benzonatate (TESSALON PERLES) 100 MG capsule Take 1 capsule (100 mg total) by mouth 3  (three) times daily as needed. 05/27/22   Sharion Balloon, FNP  camphor-menthol [facility]) lotion Apply 1 application topically as needed for itching. 06/29/20   [name], NP  cetirizine (ZYRTEC) 10 MG tablet Take 1 tablet (10 mg total) by mouth daily. Patient taking differently: Take 10 mg by mouth daily as needed for allergies or rhinitis. 06/11/17   [name], NP  promethazine-dextromethorphan (PROMETHAZINE-DM) 6.25-15 MG/5ML syrup Take 5 mLs by mouth 4 (four) times daily as needed. 06/14/22   [name], NP  trimethoprim-polymyxin b (POLYTRIM) ophthalmic solution Place 1 drop into both eyes every 4 (four) hours. Patient not taking: Reported on 06/18/2022 05/23/22   [name], NP    Physical Exam: Vitals:   06/18/22 2127  BP: (!) 166/86  Pulse: 61  Resp: 20  Temp: 97.6 F (36.4 C)  TempSrc: Oral  SpO2: 98%   Physical Exam  Labs on Admission: I have personally reviewed following labs and imaging studies  CBC: Recent Labs  Lab 06/18/22 2308  WBC 16.5*  NEUTROABS 13.7*  HGB 11.8*  HCT 36.3*  MCV 81.4  PLT 0000000   Basic Metabolic Panel: Recent Labs  Lab 06/18/22 2308  NA 138  K 3.9  CL 105  CO2 27  GLUCOSE 103*  BUN 17  CREATININE 0.86  CALCIUM 8.4*   GFR: CrCl cannot be calculated (Unknown ideal weight.). Liver Function Tests: Recent Labs  Lab 06/18/22 2308  AST 28  ALT 28  ALKPHOS 55  BILITOT 0.5  PROT 6.6  ALBUMIN 3.2*   No results for input(s): "LIPASE", "AMYLASE" in the last 168 hours. No results for input(s): "AMMONIA" in the last 168 hours. Coagulation Profile: Recent Labs  Lab 06/18/22 2308  INR 1.1   Cardiac Enzymes: No results for input(s): "CKTOTAL", "CKMB", "CKMBINDEX", "TROPONINI" in the last 168 hours. BNP (last 3 results) No results for input(s): "PROBNP" in the last 8760 hours. HbA1C: No results for input(s): "HGBA1C" in the last 72 hours. CBG: No results for input(s): "GLUCAP" in the last 168 hours. Lipid  Profile: No results for input(s): "CHOL", "HDL", "LDLCALC", "TRIG", "CHOLHDL", "LDLDIRECT" in the last 72 hours. Thyroid Function Tests: No results for input(s): "TSH", "T4TOTAL", "FREET4", "T3FREE", "THYROIDAB" in the last 72 hours. Anemia Panel: No results for input(s): "VITAMINB12", "FOLATE", "FERRITIN", "TIBC", "IRON", "RETICCTPCT" in the last 72 hours. Urine analysis:    Component Value Date/Time   COLORURINE COLORLESS (A) 03/10/2022 1903   APPEARANCEUR CLEAR (A) 03/10/2022 1903   LABSPEC 1.003 (L) 03/10/2022 1903   PHURINE 8.0 03/10/2022 1903   GLUCOSEU NEGATIVE 03/10/2022 1903   HGBUR MODERATE (A) 03/10/2022 1903   BILIRUBINUR NEGATIVE 03/10/2022 1903   BILIRUBINUR neg 11/13/2019 1741   KETONESUR NEGATIVE 03/10/2022 1903   PROTEINUR NEGATIVE 03/10/2022 1903   UROBILINOGEN 0.2 11/13/2019 1741   NITRITE NEGATIVE 03/10/2022 1903   LEUKOCYTESUR NEGATIVE 03/10/2022 1903  Radiological Exams on Admission: DG Chest Portable 1 View  Result Date: 06/18/2022 CLINICAL DATA:  Mild cough.  Preop EXAM: PORTABLE CHEST 1 VIEW COMPARISON:  03/10/2022 FINDINGS: Stable cardiomediastinal silhouette. No focal consolidation, pleural effusion, or pneumothorax. No acute osseous abnormality. IMPRESSION: No active disease. Electronically Signed   By: Placido Sou M.D.   On: 06/18/2022 23:14   CT Hip Left Wo Contrast  Result Date: 06/18/2022 CLINICAL DATA:  [place], left hip pain EXAM: CT OF THE LEFT HIP WITHOUT CONTRAST TECHNIQUE: Multidetector CT imaging of the left hip was performed according to the standard protocol. Multiplanar CT image reconstructions were also generated. RADIATION DOSE REDUCTION: This exam was performed according to the departmental dose-optimization program which includes automated exposure control, adjustment of the mA and/or kV according to patient size and/or use of iterative reconstruction technique. COMPARISON:  None Available. FINDINGS: Bones/Joint/Cartilage There is a  minimally displaced and comminuted fracture through the basicervical and intertrochanteric regions of the left femoral neck. No significant impaction or angulation at the fracture site. Remaining visualized bony structures are unremarkable. Mild left hip joint space narrowing. Ligaments Suboptimally assessed by CT. Muscles and Tendons There is enlargement and increased attenuation within the inferior margin of the iliacus muscle as it crosses the left iliac bone, with edema in the surrounding fat planes. Findings are suspicious for underlying intramuscular hematoma or partial tear. Soft tissues Subcutaneous edema surrounding the left hip fracture. There is a fat containing left inguinal hernia. No bowel herniation. Reconstructed images demonstrate no additional findings. IMPRESSION: 1. Comminuted minimally displaced basicervical and intertrochanteric left femoral neck fracture. Near anatomic alignment. 2. Abnormal appearance of the inferior margin of the iliacus muscle as it crosses the iliac bone, concerning for intramuscular hematoma or partial tear. 3. Fat containing left inguinal hernia. Electronically Signed   By: Randa Ngo M.D.   On: 06/18/2022 22:27   CT HEAD WO CONTRAST (5MM)  Result Date: 06/18/2022 CLINICAL DATA:  Head trauma, minor (Age >= 65y) EXAM: CT HEAD WITHOUT CONTRAST TECHNIQUE: Contiguous axial images were obtained from the base of the skull through the vertex without intravenous contrast. RADIATION DOSE REDUCTION: This exam was performed according to the departmental dose-optimization program which includes automated exposure control, adjustment of the mA and/or kV according to patient size and/or use of iterative reconstruction technique. COMPARISON:  MRI head 03/11/2022, CT angio head 03/11/2022 FINDINGS: Brain: Cerebral ventricle sizes are concordant with the degree of cerebral volume loss. Patchy and confluent areas of decreased attenuation are noted throughout the deep and  periventricular white matter of the cerebral hemispheres bilaterally, compatible with chronic microvascular ischemic disease. Redemonstration of left occipital encephalomalacia as well as right parietal and left frontal lobes. No evidence of large-territorial acute infarction. No parenchymal hemorrhage. No mass lesion. No extra-axial collection. No mass effect or midline shift. No hydrocephalus. Basilar cisterns are patent. Vascular: No hyperdense vessel. Skull: No acute fracture or focal lesion. Sinuses/Orbits: Mild left ethmoid sinus mucosal thickening. Paranasal sinuses and mastoid air cells are clear. Bilateral lens replacement. Otherwise the orbits are unremarkable. Other: None. IMPRESSION: No acute intracranial abnormality. Electronically Signed   By: Iven Finn M.D.   On: 06/18/2022 22:22     Data Reviewed: Relevant notes from primary care and specialist visits, past discharge summaries as available in EHR, including Care Everywhere. Prior diagnostic testing as pertinent to current admission diagnoses Updated medications and problem lists for reconciliation ED course, including vitals, labs, imaging, treatment and response to treatment Triage notes, nursing and  pharmacy notes and ED provider's notes Notable results as noted in HPI   Assessment and Plan: No notes have been filed under this hospital service. Service: Hospitalist       DVT prophylaxis: Lovenox***  Consults: none***  Advance Care Planning:   Code Status: Prior ***  Family Communication: none***  Disposition Plan: Back to previous home environment  Severity of Illness: {Observation/Inpatient:21159}  Author: Athena Masse, MD 06/18/2022 11:59 PM  For on call review [email or web address].

## 2022-06-18 NOTE — ED Provider Notes (Signed)
[facility] Provider Note    Event Date/Time   First MD Initiated Contact with Patient 06/18/22 2145     (approximate)   History   Hip Pain   HPI  Christopher Murphy is a 77 y.o. male  with h/o CVA, vascular dementia, here with fall. Pt was walking near the couch today when he lost footing and fell onto his left hip. He has been unable to put any weight on it since then. Family took him to [facility] where xr showed femoral fx and he was sent here for evaluation. No recent illnesses. No blood thinner use outside of asa. No other complaints. Pt has not been recently ill.       Physical Exam   Triage Vital Signs: ED Triage Vitals  Enc Vitals Group     BP 06/18/22 2127 (!) 166/86     Pulse Rate 06/18/22 2127 61     Resp 06/18/22 2127 20     Temp 06/18/22 2127 97.6 F (36.4 C)     Temp Source 06/18/22 2127 Oral     SpO2 06/18/22 2127 98 %     Weight --      Height --      Head Circumference --      Peak Flow --      Pain Score 06/18/22 2126 5     Pain Loc --      Pain Edu? --      Excl. in [place]? --     Most recent vital signs: Vitals:   06/18/22 2127  BP: (!) 166/86  Pulse: 61  Resp: 20  Temp: 97.6 F (36.4 C)  SpO2: 98%     General: Awake, no distress.  CV:  Good peripheral perfusion.  Resp:  Normal work of breathing.  Abd:  No distention.  Other:  LLE shortened and externally rotated, moderate tenderness with pROM. Distal DP/PT pulses 2+, normal sensation to light touch.   ED Results / Procedures / Treatments   Labs (all labs ordered are listed, but only abnormal results are displayed) Labs Reviewed  RESP PANEL BY RT-PCR (RSV, FLU A&B, COVID)  [id number]  CBC WITH DIFFERENTIAL/PLATELET  COMPREHENSIVE METABOLIC PANEL  PROTIME-INR  TYPE AND SCREEN     EKG Normal sinus rhythm, VR 68. PR 168, QRS 82, QTc 429. No acute st elevations or depressions. No ischemia or infarct   RADIOLOGY Reviewed XR from [facility] on paper  which seems to show a femoral neck fx  CT Head: NAICA CT Hip: Left femoral neck fx   I also independently reviewed and agree with radiologist interpretations.   PROCEDURES:  Critical Care performed: No   MEDICATIONS ORDERED IN ED: Medications  morphine (PF) 2 MG/ML injection 2 mg (has no administration in time range)  acetaminophen (TYLENOL) tablet 1,000 mg (1,000 mg Oral Given 06/18/22 2316)  ondansetron (ZOFRAN) injection 4 mg (4 mg Intravenous Given 06/18/22 2315)     IMPRESSION / MDM / ASSESSMENT AND PLAN / ED COURSE  I reviewed the triage vital signs and the nursing notes.                              Differential diagnosis includes, but is not limited to, hip fx, hip dislocation, mechanical fall, weakness 2/2 UTI or occult medical illness  Patient's presentation is most consistent with acute presentation with potential threat to life or bodily function.  The patient  is on the cardiac monitor to evaluate for evidence of arrhythmia and/or significant heart rate changes  77 yo M here with mechanical fall and L hip fracture. No head injury. Pt is well appearing and in NAD. Distal NVI. Reviewed plain films from [facility] which show femoral neck fx, confirmed on CT hip here. CT Head negative.   Case discussed with Dr. Karel Jarvis of Orthopedics, recommends plain films here as we cannot obtain from [facility] and admit to medicine, NPO at midnight. Daughter updated and in agreement.     FINAL CLINICAL IMPRESSION(S) / ED DIAGNOSES   Final diagnoses:  Fall, initial encounter  Closed fracture of left hip, initial encounter [facility])     Rx / DC Orders   ED Discharge Orders     None        Note:  This document was prepared using Dragon voice recognition software and may include unintentional dictation errors.   Duffy Bruce, MD 06/18/22 [phone]

## 2022-06-18 NOTE — ED Triage Notes (Signed)
Pt brought in by family. Pt has left hip pain.  Pt fell today getting up from sofa.  Family with pt.

## 2022-06-19 ENCOUNTER — Inpatient Hospital Stay: Payer: Medicare Other | Admitting: Certified Registered"

## 2022-06-19 ENCOUNTER — Encounter: Payer: Self-pay | Admitting: Internal Medicine

## 2022-06-19 ENCOUNTER — Encounter
Admission: EM | Disposition: A | Discharge status: Home Health | Payer: Self-pay | Source: Home / Self Care | Attending: Internal Medicine

## 2022-06-19 ENCOUNTER — Other Ambulatory Visit: Payer: Self-pay

## 2022-06-19 ENCOUNTER — Inpatient Hospital Stay: Payer: Medicare Other

## 2022-06-19 DIAGNOSIS — D649 Anemia, unspecified: Secondary | ICD-10-CM | POA: Diagnosis present

## 2022-06-19 DIAGNOSIS — S72001D Fracture of unspecified part of neck of right femur, subsequent encounter for closed fracture with routine healing: Secondary | ICD-10-CM | POA: Diagnosis not present

## 2022-06-19 DIAGNOSIS — Z1152 Encounter for screening for COVID-19: Secondary | ICD-10-CM | POA: Diagnosis not present

## 2022-06-19 DIAGNOSIS — Z7982 Long term (current) use of aspirin: Secondary | ICD-10-CM | POA: Diagnosis not present

## 2022-06-19 DIAGNOSIS — I6930 Unspecified sequelae of cerebral infarction: Secondary | ICD-10-CM

## 2022-06-19 DIAGNOSIS — W010XXA Fall on same level from slipping, tripping and stumbling without subsequent striking against object, initial encounter: Secondary | ICD-10-CM | POA: Diagnosis present

## 2022-06-19 DIAGNOSIS — F0150 Vascular dementia without behavioral disturbance: Secondary | ICD-10-CM

## 2022-06-19 DIAGNOSIS — S72142A Displaced intertrochanteric fracture of left femur, initial encounter for closed fracture: Secondary | ICD-10-CM | POA: Diagnosis present

## 2022-06-19 DIAGNOSIS — S72002D Fracture of unspecified part of neck of left femur, subsequent encounter for closed fracture with routine healing: Secondary | ICD-10-CM | POA: Diagnosis not present

## 2022-06-19 DIAGNOSIS — F01B0 Vascular dementia, moderate, without behavioral disturbance, psychotic disturbance, mood disturbance, and anxiety: Secondary | ICD-10-CM | POA: Diagnosis present

## 2022-06-19 DIAGNOSIS — I69351 Hemiplegia and hemiparesis following cerebral infarction affecting right dominant side: Secondary | ICD-10-CM | POA: Diagnosis not present

## 2022-06-19 DIAGNOSIS — Z79899 Other long term (current) drug therapy: Secondary | ICD-10-CM | POA: Diagnosis not present

## 2022-06-19 DIAGNOSIS — S72002A Fracture of unspecified part of neck of left femur, initial encounter for closed fracture: Secondary | ICD-10-CM | POA: Diagnosis present

## 2022-06-19 HISTORY — PX: INTRAMEDULLARY (IM) NAIL INTERTROCHANTERIC: SHX5875

## 2022-06-19 LAB — URINALYSIS, ROUTINE W REFLEX MICROSCOPIC
Bacteria, UA: NONE SEEN
Bilirubin Urine: NEGATIVE
Glucose, UA: NEGATIVE mg/dL
Ketones, ur: NEGATIVE mg/dL
Leukocytes,Ua: NEGATIVE
Nitrite: NEGATIVE
Protein, ur: 100 mg/dL — AB
Specific Gravity, Urine: 1.024 (ref 1.005–1.030)
pH: 5 (ref 5.0–8.0)

## 2022-06-19 LAB — RESP PANEL BY RT-PCR (RSV, FLU A&B, COVID)  RVPGX2
Influenza A by PCR: NEGATIVE
Influenza B by PCR: NEGATIVE
Resp Syncytial Virus by PCR: NEGATIVE
SARS Coronavirus 2 by RT PCR: NEGATIVE

## 2022-06-19 LAB — TYPE AND SCREEN
ABO/RH(D): O POS
Antibody Screen: NEGATIVE

## 2022-06-19 SURGERY — FIXATION, FRACTURE, INTERTROCHANTERIC, WITH INTRAMEDULLARY ROD
Anesthesia: General | Site: Leg Upper | Laterality: Left

## 2022-06-19 MED ORDER — MEMANTINE HCL 5 MG PO TABS
10.0000 mg | ORAL_TABLET | Freq: Two times a day (BID) | ORAL | Status: DC
  Administered 2022-06-19 – 2022-06-26 (×15): 10 mg via ORAL
  Filled 2022-06-19 (×15): qty 2

## 2022-06-19 MED ORDER — ONDANSETRON HCL 4 MG/2ML IJ SOLN: 4.0000 mg | Freq: Once | INTRAMUSCULAR | Status: DC | PRN

## 2022-06-19 MED ORDER — OXYCODONE HCL 5 MG PO TABS: 5.0000 mg | ORAL_TABLET | ORAL | Status: DC | PRN

## 2022-06-19 MED ORDER — ONDANSETRON HCL 4 MG PO TABS: 4.0000 mg | ORAL_TABLET | Freq: Four times a day (QID) | ORAL | Status: DC | PRN

## 2022-06-19 MED ORDER — LIDOCAINE HCL (PF) 2 % IJ SOLN
INTRAMUSCULAR | Status: AC
  Filled 2022-06-19: qty 5

## 2022-06-19 MED ORDER — FLEET ENEMA 7-19 GM/118ML RE ENEM: 1.0000 | ENEMA | Freq: Once | RECTAL | Status: DC | PRN

## 2022-06-19 MED ORDER — HYDROMORPHONE HCL 1 MG/ML IJ SOLN: 0.2000 mg | INTRAMUSCULAR | Status: DC | PRN

## 2022-06-19 MED ORDER — PROPOFOL 10 MG/ML IV BOLUS
INTRAVENOUS | Status: AC
  Filled 2022-06-19: qty 20

## 2022-06-19 MED ORDER — OXYCODONE HCL 5 MG PO TABS: 2.5000 mg | ORAL_TABLET | ORAL | Status: DC | PRN

## 2022-06-19 MED ORDER — ENOXAPARIN SODIUM 40 MG/0.4ML IJ SOSY
40.0000 mg | PREFILLED_SYRINGE | INTRAMUSCULAR | Status: DC
  Administered 2022-06-20 – 2022-06-26 (×7): 40 mg via SUBCUTANEOUS
  Filled 2022-06-19 (×7): qty 0.4

## 2022-06-19 MED ORDER — TRAMADOL HCL 50 MG PO TABS
50.0000 mg | ORAL_TABLET | Freq: Four times a day (QID) | ORAL | Status: DC | PRN
  Administered 2022-06-21 – 2022-06-26 (×4): 50 mg via ORAL
  Filled 2022-06-19 (×5): qty 1

## 2022-06-19 MED ORDER — SUCCINYLCHOLINE CHLORIDE 200 MG/10ML IV SOSY
PREFILLED_SYRINGE | INTRAVENOUS | Status: DC | PRN
  Administered 2022-06-19: 80 mg via INTRAVENOUS

## 2022-06-19 MED ORDER — BISACODYL 10 MG RE SUPP
10.0000 mg | Freq: Every day | RECTAL | Status: DC | PRN
  Administered 2022-06-21 – 2022-06-25 (×2): 10 mg via RECTAL
  Filled 2022-06-19 (×2): qty 1

## 2022-06-19 MED ORDER — PHENYLEPHRINE 80 MCG/ML (10ML) SYRINGE FOR IV PUSH (FOR BLOOD PRESSURE SUPPORT)
PREFILLED_SYRINGE | INTRAVENOUS | Status: DC | PRN
  Administered 2022-06-19 (×2): 80 ug via INTRAVENOUS

## 2022-06-19 MED ORDER — MORPHINE SULFATE (PF) 2 MG/ML IV SOLN: 1.0000 mg | INTRAVENOUS | Status: DC | PRN

## 2022-06-19 MED ORDER — FENTANYL CITRATE (PF) 100 MCG/2ML IJ SOLN
INTRAMUSCULAR | Status: DC | PRN
  Administered 2022-06-19: 25 ug via INTRAVENOUS
  Administered 2022-06-19: 75 ug via INTRAVENOUS

## 2022-06-19 MED ORDER — FENTANYL CITRATE (PF) 100 MCG/2ML IJ SOLN
INTRAMUSCULAR | Status: AC
  Filled 2022-06-19: qty 2

## 2022-06-19 MED ORDER — HYDROCODONE-ACETAMINOPHEN 5-325 MG PO TABS: 1.0000 | ORAL_TABLET | Freq: Four times a day (QID) | ORAL | Status: DC | PRN

## 2022-06-19 MED ORDER — PROPOFOL 10 MG/ML IV BOLUS
INTRAVENOUS | Status: DC | PRN
  Administered 2022-06-19: 100 mg via INTRAVENOUS

## 2022-06-19 MED ORDER — CEFAZOLIN SODIUM-DEXTROSE 2-3 GM-%(50ML) IV SOLR
INTRAVENOUS | Status: DC | PRN
  Administered 2022-06-19: 2 g via INTRAVENOUS

## 2022-06-19 MED ORDER — KETOROLAC TROMETHAMINE 15 MG/ML IJ SOLN
7.5000 mg | Freq: Four times a day (QID) | INTRAMUSCULAR | Status: AC
  Administered 2022-06-19 – 2022-06-20 (×4): 7.5 mg via INTRAVENOUS
  Filled 2022-06-19 (×4): qty 1

## 2022-06-19 MED ORDER — LIDOCAINE HCL (CARDIAC) PF 100 MG/5ML IV SOSY
PREFILLED_SYRINGE | INTRAVENOUS | Status: DC | PRN
  Administered 2022-06-19: 80 mg via INTRAVENOUS

## 2022-06-19 MED ORDER — PHENYLEPHRINE HCL-NACL 20-0.9 MG/250ML-% IV SOLN
INTRAVENOUS | Status: DC | PRN
  Administered 2022-06-19: 20 ug/min via INTRAVENOUS

## 2022-06-19 MED ORDER — CEFAZOLIN SODIUM-DEXTROSE 2-4 GM/100ML-% IV SOLN
INTRAVENOUS | Status: AC
  Administered 2022-06-19: 2 g via INTRAVENOUS
  Filled 2022-06-19: qty 100

## 2022-06-19 MED ORDER — METHOCARBAMOL 500 MG PO TABS
500.0000 mg | ORAL_TABLET | Freq: Four times a day (QID) | ORAL | Status: DC | PRN
  Administered 2022-06-20: 500 mg via ORAL
  Filled 2022-06-19 (×3): qty 1

## 2022-06-19 MED ORDER — DEXAMETHASONE SODIUM PHOSPHATE 10 MG/ML IJ SOLN
INTRAMUSCULAR | Status: DC | PRN
  Administered 2022-06-19: 10 mg via INTRAVENOUS

## 2022-06-19 MED ORDER — FENTANYL CITRATE (PF) 100 MCG/2ML IJ SOLN: 25.0000 ug | INTRAMUSCULAR | Status: DC | PRN

## 2022-06-19 MED ORDER — 0.9 % SODIUM CHLORIDE (POUR BTL) OPTIME
TOPICAL | Status: DC | PRN
  Administered 2022-06-19: 1000 mL

## 2022-06-19 MED ORDER — SODIUM CHLORIDE 0.9 % IV SOLN: INTRAVENOUS | Status: DC

## 2022-06-19 MED ORDER — CEFAZOLIN SODIUM-DEXTROSE 2-4 GM/100ML-% IV SOLN: 2.0000 g | Freq: Once | INTRAVENOUS | Status: DC

## 2022-06-19 MED ORDER — ONDANSETRON HCL 4 MG/2ML IJ SOLN
INTRAMUSCULAR | Status: DC | PRN
  Administered 2022-06-19 (×2): 4 mg via INTRAVENOUS

## 2022-06-19 MED ORDER — DOCUSATE SODIUM 100 MG PO CAPS
100.0000 mg | ORAL_CAPSULE | Freq: Two times a day (BID) | ORAL | Status: DC
  Administered 2022-06-19 – 2022-06-26 (×15): 100 mg via ORAL
  Filled 2022-06-19 (×15): qty 1

## 2022-06-19 MED ORDER — CEFAZOLIN SODIUM-DEXTROSE 2-4 GM/100ML-% IV SOLN
2.0000 g | Freq: Three times a day (TID) | INTRAVENOUS | Status: AC
  Administered 2022-06-20 (×2): 2 g via INTRAVENOUS
  Filled 2022-06-19 (×3): qty 100

## 2022-06-19 MED ORDER — ROCURONIUM BROMIDE 100 MG/10ML IV SOLN
INTRAVENOUS | Status: DC | PRN
  Administered 2022-06-19: 50 mg via INTRAVENOUS
  Administered 2022-06-19: 10 mg via INTRAVENOUS

## 2022-06-19 MED ORDER — MIDAZOLAM HCL 2 MG/2ML IJ SOLN
INTRAMUSCULAR | Status: AC
  Filled 2022-06-19: qty 2

## 2022-06-19 MED ORDER — PHENYLEPHRINE HCL-NACL 20-0.9 MG/250ML-% IV SOLN
INTRAVENOUS | Status: AC
  Filled 2022-06-19: qty 250

## 2022-06-19 MED ORDER — BUPIVACAINE LIPOSOME 1.3 % IJ SUSP
INTRAMUSCULAR | Status: DC | PRN
  Administered 2022-06-19: 50 mL via SURGICAL_CAVITY

## 2022-06-19 MED ORDER — ROSUVASTATIN CALCIUM 10 MG PO TABS
20.0000 mg | ORAL_TABLET | Freq: Every day | ORAL | Status: DC
  Administered 2022-06-20 – 2022-06-25 (×6): 20 mg via ORAL
  Filled 2022-06-19 (×2): qty 2
  Filled 2022-06-19: qty 1
  Filled 2022-06-19 (×5): qty 2

## 2022-06-19 MED ORDER — ACETAMINOPHEN 500 MG PO TABS
1000.0000 mg | ORAL_TABLET | Freq: Three times a day (TID) | ORAL | Status: DC
  Administered 2022-06-19 – 2022-06-26 (×22): 1000 mg via ORAL
  Filled 2022-06-19 (×22): qty 2

## 2022-06-19 MED ORDER — EPHEDRINE SULFATE (PRESSORS) 50 MG/ML IJ SOLN
INTRAMUSCULAR | Status: DC | PRN
  Administered 2022-06-19: 10 mg via INTRAVENOUS

## 2022-06-19 MED ORDER — ADULT MULTIVITAMIN W/MINERALS CH
1.0000 | ORAL_TABLET | Freq: Every day | ORAL | Status: DC
  Administered 2022-06-20 – 2022-06-26 (×7): 1 via ORAL
  Filled 2022-06-19 (×7): qty 1

## 2022-06-19 MED ORDER — TRANEXAMIC ACID-NACL 1000-0.7 MG/100ML-% IV SOLN
INTRAVENOUS | Status: DC | PRN
  Administered 2022-06-19: 1000 mg via INTRAVENOUS

## 2022-06-19 MED ORDER — METOCLOPRAMIDE HCL 5 MG PO TABS: 5.0000 mg | ORAL_TABLET | Freq: Three times a day (TID) | ORAL | Status: DC | PRN

## 2022-06-19 MED ORDER — METOCLOPRAMIDE HCL 5 MG/ML IJ SOLN: 5.0000 mg | Freq: Three times a day (TID) | INTRAMUSCULAR | Status: DC | PRN

## 2022-06-19 MED ORDER — TRANEXAMIC ACID-NACL 1000-0.7 MG/100ML-% IV SOLN
INTRAVENOUS | Status: AC
  Administered 2022-06-19: 1000 mg via INTRAVENOUS
  Filled 2022-06-19: qty 100

## 2022-06-19 MED ORDER — TRANEXAMIC ACID-NACL 1000-0.7 MG/100ML-% IV SOLN
1000.0000 mg | INTRAVENOUS | Status: AC
  Filled 2022-06-19: qty 100

## 2022-06-19 MED ORDER — METHOCARBAMOL 1000 MG/10ML IJ SOLN: 500.0000 mg | Freq: Four times a day (QID) | INTRAVENOUS | Status: DC | PRN

## 2022-06-19 MED ORDER — GLYCOPYRROLATE 0.2 MG/ML IJ SOLN
INTRAMUSCULAR | Status: DC | PRN
  Administered 2022-06-19: .2 mg via INTRAVENOUS

## 2022-06-19 MED ORDER — SENNOSIDES-DOCUSATE SODIUM 8.6-50 MG PO TABS
1.0000 | ORAL_TABLET | Freq: Every evening | ORAL | Status: DC | PRN
  Administered 2022-06-20: 1 via ORAL
  Filled 2022-06-19: qty 1

## 2022-06-19 MED ORDER — PANTOPRAZOLE SODIUM 40 MG PO TBEC
40.0000 mg | DELAYED_RELEASE_TABLET | Freq: Every day | ORAL | Status: DC
  Administered 2022-06-19 – 2022-06-26 (×8): 40 mg via ORAL
  Filled 2022-06-19 (×8): qty 1

## 2022-06-19 MED ORDER — LACTATED RINGERS IV SOLN: INTRAVENOUS | Status: DC | PRN

## 2022-06-19 MED ORDER — ENSURE ENLIVE PO LIQD
237.0000 mL | Freq: Two times a day (BID) | ORAL | Status: DC
  Administered 2022-06-20 – 2022-06-26 (×12): 237 mL via ORAL

## 2022-06-19 MED ORDER — DONEPEZIL HCL 5 MG PO TABS
10.0000 mg | ORAL_TABLET | Freq: Every day | ORAL | Status: DC
  Administered 2022-06-19 – 2022-06-25 (×8): 10 mg via ORAL
  Filled 2022-06-19 (×8): qty 2

## 2022-06-19 MED ORDER — ASPIRIN 81 MG PO TBEC
81.0000 mg | DELAYED_RELEASE_TABLET | Freq: Every day | ORAL | Status: DC
  Administered 2022-06-19 – 2022-06-26 (×8): 81 mg via ORAL
  Filled 2022-06-19 (×8): qty 1

## 2022-06-19 MED ORDER — SUGAMMADEX SODIUM 200 MG/2ML IV SOLN
INTRAVENOUS | Status: DC | PRN
  Administered 2022-06-19: 200 mg via INTRAVENOUS

## 2022-06-19 MED ORDER — ONDANSETRON HCL 4 MG/2ML IJ SOLN: 4.0000 mg | Freq: Four times a day (QID) | INTRAMUSCULAR | Status: DC | PRN

## 2022-06-19 SURGICAL SUPPLY — 43 items
BIT DRILL INTERTAN LAG SCREW (BIT) IMPLANT
BIT DRILL LONG 4.0 (BIT) IMPLANT
BLADE SURG 15 STRL LF DISP TIS (BLADE) ×1 IMPLANT
BLADE SURG 15 STRL SS (BLADE) ×1
CHLORAPREP W/TINT 26 (MISCELLANEOUS) ×1 IMPLANT
DRAPE 3/4 80X56 (DRAPES) ×1 IMPLANT
DRAPE SURG 17X11 SM STRL (DRAPES) ×2 IMPLANT
DRAPE U-SHAPE 47X51 STRL (DRAPES) ×2 IMPLANT
DRILL BIT LONG 4.0 (BIT) ×1
DRSG OPSITE POSTOP 3X4 (GAUZE/BANDAGES/DRESSINGS) ×3 IMPLANT
ELECT REM PT RETURN 9FT ADLT (ELECTROSURGICAL) ×1
ELECTRODE REM PT RTRN 9FT ADLT (ELECTROSURGICAL) ×1 IMPLANT
GLOVE BIOGEL PI IND STRL 8 (GLOVE) ×1 IMPLANT
GLOVE SURG SYN 7.5  E (GLOVE) ×1
GLOVE SURG SYN 7.5 E (GLOVE) ×1 IMPLANT
GLOVE SURG SYN 7.5 PF PI (GLOVE) ×1 IMPLANT
GOWN STRL REUS W/ TWL LRG LVL3 (GOWN DISPOSABLE) ×1 IMPLANT
GOWN STRL REUS W/ TWL XL LVL3 (GOWN DISPOSABLE) ×1 IMPLANT
GOWN STRL REUS W/TWL LRG LVL3 (GOWN DISPOSABLE) ×1
GOWN STRL REUS W/TWL XL LVL3 (GOWN DISPOSABLE) ×1
GUIDE PIN 3.2X343 (PIN) ×2
GUIDE PIN 3.2X343MM (PIN) ×2
KIT PATIENT CARE HANA TABLE (KITS) ×1 IMPLANT
KIT TURNOVER KIT A (KITS) ×1 IMPLANT
MANIFOLD NEPTUNE II (INSTRUMENTS) ×1 IMPLANT
MAT ABSORB  FLUID 56X50 GRAY (MISCELLANEOUS) ×2
MAT ABSORB FLUID 56X50 GRAY (MISCELLANEOUS) ×2 IMPLANT
NAIL TRIGEN INTERTAN 10X18CM (Nail) IMPLANT
NDL FILTER BLUNT 18X1 1/2 (NEEDLE) ×1 IMPLANT
NEEDLE FILTER BLUNT 18X1 1/2 (NEEDLE) ×1 IMPLANT
NEEDLE HYPO 22GX1.5 SAFETY (NEEDLE) ×1 IMPLANT
NS IRRIG 1000ML POUR BTL (IV SOLUTION) ×1 IMPLANT
PACK HIP COMPR (MISCELLANEOUS) ×1 IMPLANT
PIN GUIDE 3.2X343MM (PIN) IMPLANT
SCREW LAG COMPR KIT 95/90 (Screw) IMPLANT
SCREW TRIGEN LOW PROF 5.0X35 (Screw) IMPLANT
STAPLER SKIN PROX 35W (STAPLE) ×1 IMPLANT
SUT VIC AB 2-0 CT2 27 (SUTURE) ×1 IMPLANT
SYR 10ML LL (SYRINGE) ×1 IMPLANT
SYR 30ML LL (SYRINGE) ×1 IMPLANT
TAPE CLOTH 3X10 WHT NS LF (GAUZE/BANDAGES/DRESSINGS) ×2 IMPLANT
TRAP FLUID SMOKE EVACUATOR (MISCELLANEOUS) ×1 IMPLANT
WATER STERILE IRR 500ML POUR (IV SOLUTION) ×1 IMPLANT

## 2022-06-19 NOTE — Consult Note (Signed)
ORTHOPAEDIC CONSULTATION  REQUESTING PHYSICIAN: Barb Merino, MD  Chief Complaint:   L hip pain  History of Present Illness: History obtained from review of chart and discussion with daughter-in-law, who was at the bedside.  Christopher Murphy is a 77 y.o. male with a history of vascular dementia and prior CVA (residual R sided weakness) who had a fall at home yesterday while attempting to sit up from a couch and landed on his L side.  The patient noted immediate hip pain and inability to ambulate.  The patient ambulates with a cane at baseline.  The patient lives at home with wife, son, and daughter-in-law.  Pain is worse with any sort of movement.  Imaging in the emergency department showed a left intertrochanteric hip fracture.  Past Medical History:  Diagnosis Date   Blood clots in brain 2009   Stroke [facility])    Past Surgical History:  Procedure Laterality Date   COLONOSCOPY WITH PROPOFOL N/A 04/30/2019   Procedure: COLONOSCOPY WITH PROPOFOL;  Surgeon: Jonathon Bellows, MD;  Location: [facility];  Service: Gastroenterology;  Laterality: N/A;   PROSTATECTOMY  1980   Social History   Socioeconomic History   Marital status: Married    Spouse name: Not on file   Number of children: Not on file   Years of education: Not on file   Highest education level: Not on file  Occupational History   Not on file  Tobacco Use   Smoking status: Never   Smokeless tobacco: Never  Vaping Use   Vaping Use: Never used  Substance and Sexual Activity   Alcohol use: No   Drug use: No   Sexual activity: Not on file  Other Topics Concern   Not on file  Social History Narrative   Lives with wife, son and daughter in law + 2 children   Right Handed   Drinks no caffeine   Social Determinants of Health   Financial Resource Strain: Not on file  Food Insecurity: No Food Insecurity (03/12/2022)   Hunger Vital Sign    Worried About  Running Out of Food in the Last Year: Never true    Ran Out of Food in the Last Year: Never true  Transportation Needs: No Transportation Needs (03/12/2022)   PRAPARE - Hydrologist (Medical): No    Lack of Transportation (Non-Medical): No  Physical Activity: Not on file  Stress: Not on file  Social Connections: Not on file   Family History  Problem Relation Age of Onset   AAA (abdominal aortic aneurysm) Neg Hx    No Known Allergies Prior to Admission medications   Medication Sig Start Date End Date Taking? Authorizing Provider  amoxicillin-clavulanate (AUGMENTIN) 875-125 MG tablet Take 1 tablet by mouth 2 (two) times daily. 06/14/22  Yes [name], NP  ASPIRIN LOW DOSE 81 MG EC tablet TAKE 1 TABLET BY MOUTH EVERY DAY 08/10/21  Yes Baity, Coralie Keens, NP  donepezil (ARICEPT) 10 MG tablet TAKE 1 TABLET BY MOUTH EVERYDAY AT BEDTIME 11/13/21  Yes Garvin Fila, MD  memantine (NAMENDA) 10 MG tablet TAKE 1 TABLET BY MOUTH TWICE A DAY 07/06/21  Yes Garvin Fila, MD  pantoprazole (PROTONIX) 40 MG tablet TAKE 1 TABLET BY MOUTH EVERY DAY 01/15/22  Yes Baity, Coralie Keens, NP  Polyethyl Glycol-Propyl Glycol (SYSTANE) 0.4-0.3 % SOLN Apply 1 drop to eye 2 (two) times daily. Patient taking differently: Place 1 drop into both eyes 2 (two) times daily. 06/12/16  Yes [name], NP  polyethylene glycol (MIRALAX / GLYCOLAX) 17 g packet Take 17 g by mouth daily. 05/14/20  Yes Ghimire, Henreitta Leber, MD  predniSONE (DELTASONE) 10 MG tablet Take 6 tabs on day 1, 5 tabs on day 2, 4 tabs on day 3, 3 tabs on day 4, 2 tabs on day 5, 1 tab on day 6 06/14/22  Yes Baity, Coralie Keens, NP  rosuvastatin (CRESTOR) 20 MG tablet TAKE 1 TABLET BY MOUTH EVERY DAY 05/16/22  Yes Baity, Coralie Keens, NP  albuterol (VENTOLIN HFA) 108 (90 Base) MCG/ACT inhaler Inhale 2 puffs into the lungs every 6 (six) hours as needed for wheezing or shortness of breath. 06/22/21   Mar Daring, PA-C  benzonatate (TESSALON  PERLES) 100 MG capsule Take 1 capsule (100 mg total) by mouth 3 (three) times daily as needed. 05/27/22   Sharion Balloon, FNP  camphor-menthol [facility]) lotion Apply 1 application topically as needed for itching. 06/29/20   [name], NP  cetirizine (ZYRTEC) 10 MG tablet Take 1 tablet (10 mg total) by mouth daily. Patient taking differently: Take 10 mg by mouth daily as needed for allergies or rhinitis. 06/11/17   [name], NP  promethazine-dextromethorphan (PROMETHAZINE-DM) 6.25-15 MG/5ML syrup Take 5 mLs by mouth 4 (four) times daily as needed. 06/14/22   [name], NP  trimethoprim-polymyxin b (POLYTRIM) ophthalmic solution Place 1 drop into both eyes every 4 (four) hours. Patient not taking: Reported on 06/18/2022 05/23/22   [name], NP   Recent Labs    06/18/22 2308  WBC 16.5*  HGB 11.8*  HCT 36.3*  PLT 233  K 3.9  CL 105  CO2 27  BUN 17  CREATININE 0.86  GLUCOSE 103*  CALCIUM 8.4*  INR 1.1   DG HIP UNILAT WITH PELVIS 2-3 VIEWS LEFT  Result Date: 06/19/2022 CLINICAL DATA:  Left hip and leg pain after fall EXAM: DG HIP (WITH OR WITHOUT PELVIS) 2-3V LEFT; LEFT FEMUR 2 VIEWS COMPARISON:  CT of the left hip earlier tonight at 10:14 p.m. FINDINGS: Redemonstrated comminuted minimally displaced basicervical and intertrochanteric left femoral neck fracture. No hip dislocation. No fracture of the distal femur. IMPRESSION: Left basicervical/intertrochanteric femoral neck fracture. Electronically Signed   By: Placido Sou M.D.   On: 06/19/2022 01:27   DG Femur Min 2 Views Left  Result Date: 06/19/2022 CLINICAL DATA:  Left hip and leg pain after fall EXAM: DG HIP (WITH OR WITHOUT PELVIS) 2-3V LEFT; LEFT FEMUR 2 VIEWS COMPARISON:  CT of the left hip earlier tonight at 10:14 p.m. FINDINGS: Redemonstrated comminuted minimally displaced basicervical and intertrochanteric left femoral neck fracture. No hip dislocation. No fracture of the distal femur. IMPRESSION: Left  basicervical/intertrochanteric femoral neck fracture. Electronically Signed   By: Placido Sou M.D.   On: 06/19/2022 01:27   DG Chest Portable 1 View  Result Date: 06/18/2022 CLINICAL DATA:  Mild cough.  Preop EXAM: PORTABLE CHEST 1 VIEW COMPARISON:  03/10/2022 FINDINGS: Stable cardiomediastinal silhouette. No focal consolidation, pleural effusion, or pneumothorax. No acute osseous abnormality. IMPRESSION: No active disease. Electronically Signed   By: Placido Sou M.D.   On: 06/18/2022 23:14   CT Hip Left Wo Contrast  Result Date: 06/18/2022 CLINICAL DATA:  [place], left hip pain EXAM: CT OF THE LEFT HIP WITHOUT CONTRAST TECHNIQUE: Multidetector CT imaging of the left hip was performed according to the standard protocol. Multiplanar CT image reconstructions were also generated. RADIATION DOSE REDUCTION: This exam was  performed according to the departmental dose-optimization program which includes automated exposure control, adjustment of the mA and/or kV according to patient size and/or use of iterative reconstruction technique. COMPARISON:  None Available. FINDINGS: Bones/Joint/Cartilage There is a minimally displaced and comminuted fracture through the basicervical and intertrochanteric regions of the left femoral neck. No significant impaction or angulation at the fracture site. Remaining visualized bony structures are unremarkable. Mild left hip joint space narrowing. Ligaments Suboptimally assessed by CT. Muscles and Tendons There is enlargement and increased attenuation within the inferior margin of the iliacus muscle as it crosses the left iliac bone, with edema in the surrounding fat planes. Findings are suspicious for underlying intramuscular hematoma or partial tear. Soft tissues Subcutaneous edema surrounding the left hip fracture. There is a fat containing left inguinal hernia. No bowel herniation. Reconstructed images demonstrate no additional findings. IMPRESSION: 1. Comminuted minimally  displaced basicervical and intertrochanteric left femoral neck fracture. Near anatomic alignment. 2. Abnormal appearance of the inferior margin of the iliacus muscle as it crosses the iliac bone, concerning for intramuscular hematoma or partial tear. 3. Fat containing left inguinal hernia. Electronically Signed   By: Randa Ngo M.D.   On: 06/18/2022 22:27   CT HEAD WO CONTRAST (5MM)  Result Date: 06/18/2022 CLINICAL DATA:  Head trauma, minor (Age >= 65y) EXAM: CT HEAD WITHOUT CONTRAST TECHNIQUE: Contiguous axial images were obtained from the base of the skull through the vertex without intravenous contrast. RADIATION DOSE REDUCTION: This exam was performed according to the departmental dose-optimization program which includes automated exposure control, adjustment of the mA and/or kV according to patient size and/or use of iterative reconstruction technique. COMPARISON:  MRI head 03/11/2022, CT angio head 03/11/2022 FINDINGS: Brain: Cerebral ventricle sizes are concordant with the degree of cerebral volume loss. Patchy and confluent areas of decreased attenuation are noted throughout the deep and periventricular white matter of the cerebral hemispheres bilaterally, compatible with chronic microvascular ischemic disease. Redemonstration of left occipital encephalomalacia as well as right parietal and left frontal lobes. No evidence of large-territorial acute infarction. No parenchymal hemorrhage. No mass lesion. No extra-axial collection. No mass effect or midline shift. No hydrocephalus. Basilar cisterns are patent. Vascular: No hyperdense vessel. Skull: No acute fracture or focal lesion. Sinuses/Orbits: Mild left ethmoid sinus mucosal thickening. Paranasal sinuses and mastoid air cells are clear. Bilateral lens replacement. Otherwise the orbits are unremarkable. Other: None. IMPRESSION: No acute intracranial abnormality. Electronically Signed   By: Iven Finn M.D.   On: 06/18/2022 22:22     Positive  ROS: All other systems have been reviewed and were otherwise negative with the exception of those mentioned in the HPI and as above.  Physical Exam: BP (!) 149/72   Pulse 66   Temp 97.8 F (36.6 C) (Oral)   Resp 16   Ht '5\' 10"'$  (1.778 m)   Wt 65.8 kg   SpO2 98%   BMI 20.81 kg/m  General:  Alert, no acute distress Psychiatric:  Patient is NOT competent for consent; non-agitated    Orthopedic Exam:  LLE: + DF/PF/EHL SILT grossly over foot Foot wwp +Log roll/axial load   Imaging:  As above: minimally displaced L intertrochanteric hip fracture. This appears to be more of an intertrochanteric fracture as opposed to basicervical fracture.   Assessment/Plan: Andruw Rafferty is a 77 y.o. male with a L intertrochanteric hip fracture  1. I discussed the various treatment options including both surgical and non-surgical management of the fracture with the patient and/or family (  medical PoA). We discussed the high risk of perioperative complications due to patient's age, dementia, and other co-morbidities. After discussion of risks, benefits, and alternatives to surgery, the family and/or patient were in agreement to proceed with surgery. The goals of surgery would be to provide adequate pain relief and allow for mobilization. Plan for surgery is L hip cephalomedullary nailing today, 06/19/2022. 2. NPO until OR 3. Hold anticoagulation in advance of OR      Leim Fabry   06/19/2022 10:21 AM

## 2022-06-19 NOTE — Assessment & Plan Note (Signed)
Continue donepezil and Namenda Delirium precautions 

## 2022-06-19 NOTE — Transfer of Care (Signed)
Immediate Anesthesia Transfer of Care Note  Patient: Christopher Murphy  Procedure(s) Performed: INTRAMEDULLARY (IM) NAIL INTERTROCHANTERIC (Left: Leg Upper)  Patient Location: PACU  Anesthesia Type:General  Level of Consciousness: drowsy and patient cooperative  Airway & Oxygen Therapy: Patient Spontanous Breathing and Patient connected to face mask oxygen  Post-op Assessment: Report given to RN and Post -op Vital signs reviewed and stable  Post vital signs: Reviewed and stable  Last Vitals:  Vitals Value Taken Time  BP 132/88 06/19/22 1149  Temp    Pulse 82 06/19/22 1151  Resp    SpO2 100 % 06/19/22 1151  Vitals shown include unvalidated device data.  Last Pain:  Vitals:   06/19/22 0951  TempSrc: Oral  PainSc: 0-No pain         Complications: No notable events documented.

## 2022-06-19 NOTE — H&P (Signed)
H&P reviewed. No significant changes noted.  

## 2022-06-19 NOTE — Assessment & Plan Note (Signed)
Possible hematoma/partial tear iliac [place] muscle Mechanical fall Keep n.p.o. Orthopedics to take to the OR in the a.m. Pain management Low to moderate risk of perioperative cardiopulmonary complications based on patient not having any history of A-fib, CHF, CAD, syncope, COPD, OSA

## 2022-06-19 NOTE — Progress Notes (Signed)
PROGRESS NOTE    Christopher Murphy  [id number] DOB: 02-Dec-1945 DOA: 06/18/2022 PCP: [name], NP    Brief Narrative:  77 year old with history of stroke and right hemiparesis, moderate vascular dementia who lives at home with his family presented with mechanical fall, he lost his balance getting up from the couch and falling on his left hip.  In the emergency room hemodynamically stable.  Skeletal survey negative except left femoral neck fracture.  Admitted with orthopedic consultation.   Assessment & Plan:   Close traumatic right femoral neck fracture: N.p.o., IV fluids, pain medications, SCDs for DVT prophylaxis before surgery, Scheduled for ORIF today.  Postop management as per surgery.  Chronic medical issues including Vascular dementia, on donepezil and Namenda.  Continued.  High risk of postop delirium.  All time fall precautions.  Delirium precautions. History of stroke with right hemiparesis: On aspirin and Crestor.  Continued.   DVT prophylaxis: SCDs Start: 06/19/22 0008   Code Status: Full code Family Communication: Daughter-in-law at the bedside Disposition Plan: Status is: Inpatient Remains inpatient appropriate because: Inpatient surgery planned     Consultants:  Orthopedics  Procedures:  ORIF planned  Antimicrobials:  None   Subjective: Patient seen and examined.  He was in the emergency room.  He did not wake up for me as he had just gotten pain medicine early morning.  Daughter-in-law at the bedside stated that he was comfortable after pain medications and did not offer any problems.  Family aware about surgical procedure.  Objective: Vitals:   06/19/22 0530 06/19/22 0630 06/19/22 0700 06/19/22 0832  BP: 133/74 137/82 137/83 (!) 141/73  Pulse: (!) 59 (!) 57 (!) 58 66  Resp: '19 17 15 15  '$ Temp:   (!) 97.5 F (36.4 C) 97.7 F (36.5 C)  TempSrc:      SpO2:   97% 100%    Intake/Output Summary (Last 24 hours) at 06/19/2022 [id number] Last  data filed at 06/19/2022 0851 Gross per 24 hour  Intake 1000 ml  Output 200 ml  Net 800 ml   There were no vitals filed for this visit.  Examination:  General exam: Appears calm and comfortable sleeping. Respiratory system: No added.  On room air. Looks fairly comfortable laying in bed on his side.    Data Reviewed: I have personally reviewed following labs and imaging studies  CBC: Recent Labs  Lab 06/18/22 2308  WBC 16.5*  NEUTROABS 13.7*  HGB 11.8*  HCT 36.3*  MCV 81.4  PLT 0000000   Basic Metabolic Panel: Recent Labs  Lab 06/18/22 2308  NA 138  K 3.9  CL 105  CO2 27  GLUCOSE 103*  BUN 17  CREATININE 0.86  CALCIUM 8.4*   GFR: CrCl cannot be calculated (Unknown ideal weight.). Liver Function Tests: Recent Labs  Lab 06/18/22 2308  AST 28  ALT 28  ALKPHOS 55  BILITOT 0.5  PROT 6.6  ALBUMIN 3.2*   No results for input(s): "LIPASE", "AMYLASE" in the last 168 hours. No results for input(s): "AMMONIA" in the last 168 hours. Coagulation Profile: Recent Labs  Lab 06/18/22 2308  INR 1.1   Cardiac Enzymes: No results for input(s): "CKTOTAL", "CKMB", "CKMBINDEX", "TROPONINI" in the last 168 hours. BNP (last 3 results) No results for input(s): "PROBNP" in the last 8760 hours. HbA1C: No results for input(s): "HGBA1C" in the last 72 hours. CBG: No results for input(s): "GLUCAP" in the last 168 hours. Lipid Profile: No results for input(s): "CHOL", "HDL", "LDLCALC", "TRIG", "  CHOLHDL", "LDLDIRECT" in the last 72 hours. Thyroid Function Tests: No results for input(s): "TSH", "T4TOTAL", "FREET4", "T3FREE", "THYROIDAB" in the last 72 hours. Anemia Panel: No results for input(s): "VITAMINB12", "FOLATE", "FERRITIN", "TIBC", "IRON", "RETICCTPCT" in the last 72 hours. Sepsis Labs: No results for input(s): "PROCALCITON", "LATICACIDVEN" in the last 168 hours.  Recent Results (from the past 240 hour(s))  Resp panel by RT-PCR (RSV, Flu A&B, Covid) Anterior Nasal  Swab     Status: None   Collection Time: 06/18/22 10:43 PM   Specimen: Anterior Nasal Swab  Result Value Ref Range Status   SARS Coronavirus 2 by RT PCR NEGATIVE NEGATIVE Final    Comment: (NOTE) SARS-CoV-2 target nucleic acids are NOT DETECTED.  The SARS-CoV-2 RNA is generally detectable in upper respiratory specimens during the acute phase of infection. The lowest concentration of SARS-CoV-2 viral copies this assay can detect is 138 copies/mL. A negative result does not preclude SARS-Cov-2 infection and should not be used as the sole basis for treatment or other patient management decisions. A negative result may occur with  improper specimen collection/handling, submission of specimen other than nasopharyngeal swab, presence of viral mutation(s) within the areas targeted by this assay, and inadequate number of viral copies(<138 copies/mL). A negative result must be combined with clinical observations, patient history, and epidemiological information. The expected result is Negative.  Fact Sheet for Patients:  EntrepreneurPulse.com.au  Fact Sheet for Healthcare Providers:  IncredibleEmployment.be  This test is no t yet approved or cleared by the [place] FDA and  has been authorized for detection and/or diagnosis of SARS-CoV-2 by FDA under an Emergency Use Authorization (EUA). This EUA will remain  in effect (meaning this test can be used) for the duration of the COVID-19 declaration under Section 564(b)(1) of the Act, 21 U.S.C.section 360bbb-3(b)(1), unless the authorization is terminated  or revoked sooner.       Influenza A by PCR NEGATIVE NEGATIVE Final   Influenza B by PCR NEGATIVE NEGATIVE Final    Comment: (NOTE) The Xpert Xpress SARS-CoV-2/FLU/RSV plus assay is intended as an aid in the diagnosis of influenza from Nasopharyngeal swab specimens and should not be used as a sole basis for treatment. Nasal washings and aspirates  are unacceptable for Xpert Xpress SARS-CoV-2/FLU/RSV testing.  Fact Sheet for Patients: EntrepreneurPulse.com.au  Fact Sheet for Healthcare Providers: IncredibleEmployment.be  This test is not yet approved or cleared by the [place] FDA and has been authorized for detection and/or diagnosis of SARS-CoV-2 by FDA under an Emergency Use Authorization (EUA). This EUA will remain in effect (meaning this test can be used) for the duration of the COVID-19 declaration under Section 564(b)(1) of the Act, 21 U.S.C. section 360bbb-3(b)(1), unless the authorization is terminated or revoked.     Resp Syncytial Virus by PCR NEGATIVE NEGATIVE Final    Comment: (NOTE) Fact Sheet for Patients: EntrepreneurPulse.com.au  Fact Sheet for Healthcare Providers: IncredibleEmployment.be  This test is not yet approved or cleared by the [place] FDA and has been authorized for detection and/or diagnosis of SARS-CoV-2 by FDA under an Emergency Use Authorization (EUA). This EUA will remain in effect (meaning this test can be used) for the duration of the COVID-19 declaration under Section 564(b)(1) of the Act, 21 U.S.C. section 360bbb-3(b)(1), unless the authorization is terminated or revoked.  Performed at [facility], [address] [place], [place] [address]          Radiology Studies: DG HIP UNILAT WITH PELVIS 2-3 VIEWS LEFT  Result Date: 06/19/2022 CLINICAL DATA:  Left hip and leg pain after fall EXAM: DG HIP (WITH OR WITHOUT PELVIS) 2-3V LEFT; LEFT FEMUR 2 VIEWS COMPARISON:  CT of the left hip earlier tonight at 10:14 p.m. FINDINGS: Redemonstrated comminuted minimally displaced basicervical and intertrochanteric left femoral neck fracture. No hip dislocation. No fracture of the distal femur. IMPRESSION: Left basicervical/intertrochanteric femoral neck fracture. Electronically Signed   By: Placido Sou M.D.   On: 06/19/2022 01:27   DG Femur Min 2 Views Left  Result Date: 06/19/2022 CLINICAL DATA:  Left hip and leg pain after fall EXAM: DG HIP (WITH OR WITHOUT PELVIS) 2-3V LEFT; LEFT FEMUR 2 VIEWS COMPARISON:  CT of the left hip earlier tonight at 10:14 p.m. FINDINGS: Redemonstrated comminuted minimally displaced basicervical and intertrochanteric left femoral neck fracture. No hip dislocation. No fracture of the distal femur. IMPRESSION: Left basicervical/intertrochanteric femoral neck fracture. Electronically Signed   By: Placido Sou M.D.   On: 06/19/2022 01:27   DG Chest Portable 1 View  Result Date: 06/18/2022 CLINICAL DATA:  Mild cough.  Preop EXAM: PORTABLE CHEST 1 VIEW COMPARISON:  03/10/2022 FINDINGS: Stable cardiomediastinal silhouette. No focal consolidation, pleural effusion, or pneumothorax. No acute osseous abnormality. IMPRESSION: No active disease. Electronically Signed   By: Placido Sou M.D.   On: 06/18/2022 23:14   CT Hip Left Wo Contrast  Result Date: 06/18/2022 CLINICAL DATA:  [place], left hip pain EXAM: CT OF THE LEFT HIP WITHOUT CONTRAST TECHNIQUE: Multidetector CT imaging of the left hip was performed according to the standard protocol. Multiplanar CT image reconstructions were also generated. RADIATION DOSE REDUCTION: This exam was performed according to the departmental dose-optimization program which includes automated exposure control, adjustment of the mA and/or kV according to patient size and/or use of iterative reconstruction technique. COMPARISON:  None Available. FINDINGS: Bones/Joint/Cartilage There is a minimally displaced and comminuted fracture through the basicervical and intertrochanteric regions of the left femoral neck. No significant impaction or angulation at the fracture site. Remaining visualized bony structures are unremarkable. Mild left hip joint space narrowing. Ligaments Suboptimally assessed by CT. Muscles and Tendons There is enlargement  and increased attenuation within the inferior margin of the iliacus muscle as it crosses the left iliac bone, with edema in the surrounding fat planes. Findings are suspicious for underlying intramuscular hematoma or partial tear. Soft tissues Subcutaneous edema surrounding the left hip fracture. There is a fat containing left inguinal hernia. No bowel herniation. Reconstructed images demonstrate no additional findings. IMPRESSION: 1. Comminuted minimally displaced basicervical and intertrochanteric left femoral neck fracture. Near anatomic alignment. 2. Abnormal appearance of the inferior margin of the iliacus muscle as it crosses the iliac bone, concerning for intramuscular hematoma or partial tear. 3. Fat containing left inguinal hernia. Electronically Signed   By: Randa Ngo M.D.   On: 06/18/2022 22:27   CT HEAD WO CONTRAST (5MM)  Result Date: 06/18/2022 CLINICAL DATA:  Head trauma, minor (Age >= 65y) EXAM: CT HEAD WITHOUT CONTRAST TECHNIQUE: Contiguous axial images were obtained from the base of the skull through the vertex without intravenous contrast. RADIATION DOSE REDUCTION: This exam was performed according to the departmental dose-optimization program which includes automated exposure control, adjustment of the mA and/or kV according to patient size and/or use of iterative reconstruction technique. COMPARISON:  MRI head 03/11/2022, CT angio head 03/11/2022 FINDINGS: Brain: Cerebral ventricle sizes are concordant with the degree of cerebral volume loss. Patchy and confluent areas of decreased attenuation are noted throughout the deep and  periventricular white matter of the cerebral hemispheres bilaterally, compatible with chronic microvascular ischemic disease. Redemonstration of left occipital encephalomalacia as well as right parietal and left frontal lobes. No evidence of large-territorial acute infarction. No parenchymal hemorrhage. No mass lesion. No extra-axial collection. No mass effect or  midline shift. No hydrocephalus. Basilar cisterns are patent. Vascular: No hyperdense vessel. Skull: No acute fracture or focal lesion. Sinuses/Orbits: Mild left ethmoid sinus mucosal thickening. Paranasal sinuses and mastoid air cells are clear. Bilateral lens replacement. Otherwise the orbits are unremarkable. Other: None. IMPRESSION: No acute intracranial abnormality. Electronically Signed   By: Iven Finn M.D.   On: 06/18/2022 22:22        Scheduled Meds:  [MAR Hold] aspirin EC  81 mg Oral Daily   [MAR Hold] donepezil  10 mg Oral QHS   [MAR Hold] memantine  10 mg Oral BID   [MAR Hold] pantoprazole  40 mg Oral Daily   [MAR Hold] rosuvastatin  20 mg Oral Daily   Continuous Infusions:  ceFAZolin      ceFAZolin (ANCEF) IV     tranexamic acid     tranexamic acid       LOS: 0 days    Time spent: 35 minutes    Barb Merino, MD [facility] Pager [phone]

## 2022-06-19 NOTE — Assessment & Plan Note (Addendum)
Hemiparesis right side Continue aspirin and rosuvastatin.   Patient last stroke was in December 2023

## 2022-06-19 NOTE — Anesthesia Procedure Notes (Addendum)
Procedure Name: Intubation Date/Time: 06/19/2022 10:35 AM  Performed by: Kelton Pillar, CRNAPre-anesthesia Checklist: Patient identified, Emergency Drugs available, Suction available and Patient being monitored Patient Re-evaluated:Patient Re-evaluated prior to induction Oxygen Delivery Method: Circle system utilized Preoxygenation: Pre-oxygenation with 100% oxygen Induction Type: IV induction Ventilation: Mask ventilation without difficulty Laryngoscope Size: McGraph and 3 Grade View: Grade I Tube type: Oral Tube size: 7.0 mm Number of attempts: 1 Airway Equipment and Method: Stylet and Oral airway Placement Confirmation: ETT inserted through vocal cords under direct vision, positive ETCO2, breath sounds checked- equal and bilateral and CO2 detector Secured at: 21 cm Tube secured with: Tape Dental Injury: Teeth and Oropharynx as per pre-operative assessment  Future Recommendations: Recommend- induction with short-acting agent, and alternative techniques readily available Comments: +Beard, atraumatic TFH CRNA

## 2022-06-19 NOTE — Progress Notes (Signed)
Initial Nutrition Assessment  DOCUMENTATION CODES:   Not applicable  INTERVENTION:   -Once diet is advanced, add:  -Ensure Enlive po BID, each supplement provides 350 kcal and 20 grams of protein -MVI with minerals daily  NUTRITION DIAGNOSIS:   Increased nutrient needs related to post-op healing as evidenced by estimated needs.  GOAL:   Patient will meet greater than or equal to 90% of their needs  MONITOR:   PO intake, Supplement acceptance, Diet advancement  REASON FOR ASSESSMENT:   Consult Assessment of nutrition requirement/status, Hip fracture protocol  ASSESSMENT:   Pt with medical history significant for Prior CVA with right-sided weakness, moderate vascular dementia followed by neurology, recurrent strokesand with right hemiparesis from remote prior stroke, who presents following a mechanical fall onto his left hip.  Pt admitted with closed lt hip fracture.   Reviewed I/O's: +1 L x 24 hours  Pt unavailable at time of visit x 2. Pt down in OR and no family present at bedside. RD unable to obtain further nutrition-related history or complete nutrition-focused physical exam at this time.     Pt currently NPO. Orthopedics consult pending for potential surgery today.   Reviewed wt hx; wt has been stable over the past month.   Pt with increased nutritional needs for post-operative healing and would benefit from addition of oral nutrition supplements.   Medications reviewed.   Lab Results  Component Value Date   HGBA1C 5.6 03/10/2022   PTA DM medications are none.   Labs reviewed: CBGS: 83 (inpatient orders for glycemic control are none).    Diet Order:   Diet Order             Diet regular Room service appropriate? Yes; Fluid consistency: Thin  Diet effective now                   EDUCATION NEEDS:   Not appropriate for education at this time  Skin:  Skin Assessment: Skin Integrity Issues: Skin Integrity Issues:: Incisions Incisions: closed  lt thigh  Last BM:  Unknown  Height:   Ht Readings from Last 1 Encounters:  06/19/22 '5\' 10"'$  (1.778 m)    Weight:   Wt Readings from Last 1 Encounters:  06/19/22 65.8 kg    Ideal Body Weight:  75.5 kg  BMI:  Body mass index is 20.81 kg/m.  Estimated Nutritional Needs:   Kcal:  1950-2150  Protein:  100-115 grams  Fluid:  > 1.9 L    Loistine Chance, RD, LDN, [place] Registered Dietitian II Certified Diabetes Care and Education Specialist Please refer to [facility] for RD and/or RD on-call/weekend/after hours pager

## 2022-06-19 NOTE — Anesthesia Preprocedure Evaluation (Signed)
Anesthesia Evaluation  Patient identified by MRN, date of birth, ID band Patient awake    Reviewed: Allergy & Precautions, H&P , NPO status , Patient's Chart, lab work & pertinent test results, reviewed documented beta blocker date and time   Airway Mallampati: II   Neck ROM: full    Dental  (+) Poor Dentition   Pulmonary neg pulmonary ROS   Pulmonary exam normal        Cardiovascular Exercise Tolerance: Poor negative cardio ROS Normal cardiovascular exam Rhythm:regular Rate:Normal     Neuro/Psych  PSYCHIATRIC DISORDERS     Dementia TIACVA negative neurological ROS  negative psych ROS   GI/Hepatic negative GI ROS, Neg liver ROS,GERD  Medicated,,  Endo/Other  negative endocrine ROS    Renal/GU negative Renal ROS  negative genitourinary   Musculoskeletal   Abdominal   Peds  Hematology negative hematology ROS (+) Blood dyscrasia, anemia   Anesthesia Other Findings Past Medical History: 2009: Blood clots in brain No date: Stroke [facility]) Past Surgical History: 04/30/2019: COLONOSCOPY WITH PROPOFOL; N/A     Comment:  Procedure: COLONOSCOPY WITH PROPOFOL;  Surgeon: Jonathon Bellows, MD;  Location: [facility];  Service:               Gastroenterology;  Laterality: N/A; 1980: PROSTATECTOMY   Reproductive/Obstetrics negative OB ROS                              Anesthesia Physical Anesthesia Plan  ASA: 3  Anesthesia Plan: General ETT   Post-op Pain Management:    Induction:   PONV Risk Score and Plan: 3  Airway Management Planned:   Additional Equipment:   Intra-op Plan:   Post-operative Plan:   Informed Consent: I have reviewed the patients History and Physical, chart, labs and discussed the procedure including the risks, benefits and alternatives for the proposed anesthesia with the patient or authorized representative who has indicated his/her understanding and  acceptance.     Dental Advisory Given  Plan Discussed with: CRNA  Anesthesia Plan Comments:          Anesthesia Quick Evaluation

## 2022-06-19 NOTE — Progress Notes (Signed)
An interpreter was offered to patient and daughter-in-law; declined per family.

## 2022-06-19 NOTE — Op Note (Signed)
DATE OF SURGERY: 06/19/2022  PREOPERATIVE DIAGNOSIS: Left intertrochanteric hip fracture  POSTOPERATIVE DIAGNOSIS: Left intertrochanteric hip fracture  PROCEDURE: Intramedullary nailing of Left femur with cephalomedullary device  SURGEON: Cato Mulligan, MD  ANESTHESIA: Gen  EBL: 50 cc  IVF: per anesthesia record  COMPONENTS:  [employer] Trigen Intertan Short Nail: 10x170m; 923mlag screw with 9054mompression screw; 5x35m32mstal cortical interlocking screw  INDICATIONS: Christopher Murphy 76 y53. male who sustained an intertrochanteric fracture after a fall. Risks and benefits of intramedullary nailing were explained to the patient and/or family. Risks include but are not limited to bleeding, infection, injury to tissues, nerves, vessels, nonunion/malunion, hardware failure, limb length discrepancy/hip rotation mismatch and risks of anesthesia. The patient and/or family understand these risks, have completed an informed consent, and wish to proceed.   PROCEDURE:  The patient was brought into the operating room. After administering anesthesia, the patient was placed in the supine position on the Hana table. The uninjured leg was placed in an extended position while the injured lower extremity was placed in longitudinal traction. The fracture was reduced using longitudinal traction and internal rotation. The adequacy of reduction was verified fluoroscopically in AP and lateral projections and found to be acceptable. The lateral aspect of the hip and thigh were prepped with ChloraPrep solution before being draped sterilely. Preoperative IV antibiotics were administered. A timeout was performed to verify the appropriate surgical site, patient, and procedure.    The greater trochanter was identified and an approximately 6 cm incision was made about 3 fingerbreadths above the tip of the greater trochanter. The incision was carried down through the subcutaneous tissues to expose the  gluteal fascia. This was split the length of the incision, providing access to the tip of the trochanter. Under fluoroscopic guidance, a guidewire was drilled through the tip of the trochanter into the proximal metaphysis to the level of the lesser trochanter. After verifying its position fluoroscopically in AP and lateral projections, it was overreamed with the opening reamer to the level of the lesser trochanter. The nail was selected and advanced to the appropriate depth as verified fluoroscopically.    The guide system for the lag screw was positioned and advanced through an approximately 5cm incision over the lateral aspect of the proximal femur. The guidewire was drilled up through the femoral nail and into the femoral neck to rest within 5 mm of subchondral bone. After verifying its position in the femoral neck and head in both AP and lateral projections, the guidewire was measured and appropriate sized lag screw was selected.  The channel for the compression screw was drilled and antirotation bar was placed.  Lag screw was drilled and placed in appropriate position.  Compression screw was then placed.  Appropriate compression was achieved.  The set screw was locked in place. Again, the adequacy of hardware position and fracture reduction was verified fluoroscopically in AP and lateral projections.   Attention was then turned to the distal interlocking screw in the diaphysis. Using a targeted assembly, a stab incision was made and hole was drilled through the nail. An interlocking screw was placed with excellent purchase.  Appropriate screw position was verified fluoroscopically in AP and lateral projections.   The wounds were irrigated thoroughly with sterile saline solution. Local anesthetic was injected into the wounds. The subcutaneous tissues were closed using 2-0 Vicryl interrupted sutures. The skin was closed using staples. Sterile occlusive dressings were applied to all wounds. The patient was  then transferred to the recovery room in satisfactory condition.   POSTOPERATIVE PLAN: The patient will be WBAT on the operative extremity. Lovenox '40mg'$ /day x 4 weeks to start on POD#1. Perioperative IV antibiotics x 24 hours. PT/OT on POD#1.

## 2022-06-19 NOTE — TOC Progression Note (Signed)
Transition of Care [facility]) - Progression Note    Patient Details  Name: Christopher Murphy MRN: [id number] Date of Birth: 11/24/1945  Transition of Care [facility]) CM/SW [place], RN Phone Number: 06/19/2022, 2:00 PM  Clinical Narrative:    The patient will be evaluated by PT, TOC to follow for needs and assist with DC planning        Expected Discharge Plan and Services                                               Social Determinants of Health (SDOH) Interventions SDOH Screenings   Food Insecurity: No Food Insecurity (03/12/2022)  Housing: Low Risk  (03/12/2022)  Transportation Needs: No Transportation Needs (03/12/2022)  Utilities: Not At Risk (03/12/2022)  Depression (PHQ2-9): Low Risk  (05/23/2022)  Tobacco Use: Low Risk  (06/19/2022)    Readmission Risk Interventions     No data to display

## 2022-06-20 ENCOUNTER — Encounter: Payer: Self-pay | Admitting: Orthopedic Surgery

## 2022-06-20 DIAGNOSIS — S72001D Fracture of unspecified part of neck of right femur, subsequent encounter for closed fracture with routine healing: Secondary | ICD-10-CM

## 2022-06-20 LAB — CBC
HCT: 25 % — ABNORMAL LOW (ref 39.0–52.0)
Hemoglobin: 8.3 g/dL — ABNORMAL LOW (ref 13.0–17.0)
MCH: 26.7 pg (ref 26.0–34.0)
MCHC: 33.2 g/dL (ref 30.0–36.0)
MCV: 80.4 fL (ref 80.0–100.0)
Platelets: 167 10*3/uL (ref 150–400)
RBC: 3.11 MIL/uL — ABNORMAL LOW (ref 4.22–5.81)
RDW: 14.1 % (ref 11.5–15.5)
WBC: 11.7 10*3/uL — ABNORMAL HIGH (ref 4.0–10.5)
nRBC: 0 % (ref 0.0–0.2)

## 2022-06-20 LAB — BASIC METABOLIC PANEL
Anion gap: 4 — ABNORMAL LOW (ref 5–15)
BUN: 16 mg/dL (ref 8–23)
CO2: 26 mmol/L (ref 22–32)
Calcium: 7.7 mg/dL — ABNORMAL LOW (ref 8.9–10.3)
Chloride: 105 mmol/L (ref 98–111)
Creatinine, Ser: 1 mg/dL (ref 0.61–1.24)
GFR, Estimated: >60 mL/min (ref >60)
Glucose, Bld: 131 mg/dL — ABNORMAL HIGH (ref 70–99)
Potassium: 3.7 mmol/L (ref 3.5–5.1)
Sodium: 135 mmol/L (ref 135–145)

## 2022-06-20 MED ORDER — ONDANSETRON HCL 4 MG PO TABS: 4.0000 mg | ORAL_TABLET | Freq: Four times a day (QID) | ORAL | 0 refills | Status: DC | PRN

## 2022-06-20 MED ORDER — TRAMADOL HCL 50 MG PO TABS: 50.0000 mg | ORAL_TABLET | Freq: Four times a day (QID) | ORAL | 0 refills | Status: DC | PRN

## 2022-06-20 MED ORDER — OXYCODONE HCL 5 MG PO TABS: 2.5000 mg | ORAL_TABLET | ORAL | 0 refills | Status: DC | PRN

## 2022-06-20 MED ORDER — ENOXAPARIN SODIUM 40 MG/0.4ML IJ SOSY: 40.0000 mg | PREFILLED_SYRINGE | INTRAMUSCULAR | 0 refills | Status: DC

## 2022-06-20 MED ORDER — METHOCARBAMOL 500 MG PO TABS: 500.0000 mg | ORAL_TABLET | Freq: Four times a day (QID) | ORAL | 0 refills | Status: DC | PRN

## 2022-06-20 NOTE — Plan of Care (Signed)
  Problem: Education: Goal: Knowledge of General Education information will improve Description: Including pain rating scale, medication(s)/side effects and non-pharmacologic comfort measures Outcome: Progressing   Problem: Health Behavior/Discharge Planning: Goal: Ability to manage health-related needs will improve Outcome: Progressing   Problem: Clinical Measurements: Goal: Ability to maintain clinical measurements within normal limits will improve Outcome: Progressing Goal: Will remain free from infection Outcome: Progressing Goal: Diagnostic test results will improve Outcome: Progressing Goal: Respiratory complications will improve Outcome: Progressing Goal: Cardiovascular complication will be avoided Outcome: Progressing   Problem: Activity: Goal: Risk for activity intolerance will decrease Outcome: Progressing   Problem: Nutrition: Goal: Adequate nutrition will be maintained Outcome: Progressing   Problem: Coping: Goal: Level of anxiety will decrease Outcome: Progressing   Problem: Elimination: Goal: Will not experience complications related to bowel motility Outcome: Progressing Goal: Will not experience complications related to urinary retention Outcome: Progressing   Problem: Pain Managment: Goal: General experience of comfort will improve Outcome: Progressing   Problem: Safety: Goal: Ability to remain free from injury will improve Outcome: Progressing   Problem: Skin Integrity: Goal: Risk for impaired skin integrity will decrease Outcome: Progressing   Problem: Education: Goal: Knowledge of General Education information will improve Description: Including pain rating scale, medication(s)/side effects and non-pharmacologic comfort measures Outcome: Progressing   Problem: Health Behavior/Discharge Planning: Goal: Ability to manage health-related needs will improve Outcome: Progressing   Problem: Clinical Measurements: Goal: Ability to maintain  clinical measurements within normal limits will improve Outcome: Progressing Goal: Will remain free from infection Outcome: Progressing Goal: Diagnostic test results will improve Outcome: Progressing Goal: Respiratory complications will improve Outcome: Progressing Goal: Cardiovascular complication will be avoided Outcome: Progressing   Problem: Activity: Goal: Risk for activity intolerance will decrease Outcome: Progressing   Problem: Nutrition: Goal: Adequate nutrition will be maintained Outcome: Progressing   Problem: Coping: Goal: Level of anxiety will decrease Outcome: Progressing   Problem: Elimination: Goal: Will not experience complications related to bowel motility Outcome: Progressing Goal: Will not experience complications related to urinary retention Outcome: Progressing   Problem: Pain Managment: Goal: General experience of comfort will improve Outcome: Progressing   Problem: Safety: Goal: Ability to remain free from injury will improve Outcome: Progressing   Problem: Skin Integrity: Goal: Risk for impaired skin integrity will decrease Outcome: Progressing   Problem: Education: Goal: Verbalization of understanding the information provided (i.e., activity precautions, restrictions, etc) will improve Outcome: Progressing Goal: Individualized Educational Video(s) Outcome: Progressing   Problem: Activity: Goal: Ability to ambulate and perform ADLs will improve Outcome: Progressing   Problem: Clinical Measurements: Goal: Postoperative complications will be avoided or minimized Outcome: Progressing   Problem: Self-Concept: Goal: Ability to maintain and perform role responsibilities to the fullest extent possible will improve Outcome: Progressing   Problem: Pain Management: Goal: Pain level will decrease Outcome: Progressing   

## 2022-06-20 NOTE — Evaluation (Signed)
Occupational Therapy Evaluation Patient Details Name: Christopher Murphy MRN: [id number] DOB: 05/18/1945 Today's Date: 06/20/2022   History of Present Illness 77 year old with history of stroke and right hemiparesis, moderate vascular dementia who lives at home with his family presented 06/19/22 with mechanical fall, he lost his balance getting up from the couch and falling on his left hip.  In the emergency room hemodynamically stable.  Skeletal survey negative except left femoral neck fracture.  S/P L femur ORIF. WBAT.   Clinical Impression   Patient received for OT evaluation. See flowsheet below for details of function. Generally, patient requiring MOD A for bed mobility, MIN A with RW for functional mobility, and MIN A-MAX A for ADLs. Patient will benefit from continued OT while in acute care.       Recommendations for follow up therapy are one component of a multi-disciplinary discharge planning process, led by the attending physician.  Recommendations may be updated based on patient status, additional functional criteria and insurance authorization.   Follow Up Recommendations  Home health OT     Assistance Recommended at Discharge Frequent or constant Supervision/Assistance  Patient can return home with the following A lot of help with walking and/or transfers;A lot of help with bathing/dressing/bathroom;Assistance with cooking/housework;Direct supervision/assist for medications management;Direct supervision/assist for financial management;Help with stairs or ramp for entrance;Assist for transportation    Functional Status Assessment  Patient has had a recent decline in their functional status and demonstrates the ability to make significant improvements in function in a reasonable and predictable amount of time.  Equipment Recommendations  Other (comment) (bed rail)    Recommendations for Other Services       Precautions / Restrictions Precautions Precautions:  Fall Restrictions Weight Bearing Restrictions: Yes LLE Weight Bearing: Weight bearing as tolerated (S/p IMN from hip fx)      Mobility Bed Mobility Overal bed mobility: Needs Assistance Bed Mobility: Supine to Sit     Supine to sit: Mod assist     General bed mobility comments: discussed purchase of bedrail with DIL, as pt does not have a hospital bed at home and would benefit from one during bed mobility and sit-to-stand and stand-to-sit transfers    Transfers Overall transfer level: Needs assistance Equipment used: Rolling walker (2 wheels) Transfers: Sit to/from Stand Sit to Stand: Min assist, +2 safety/equipment, +2 physical assistance           General transfer comment: needing cues      Balance Overall balance assessment: Needs assistance Sitting-balance support: Feet supported Sitting balance-Leahy Scale: Good     Standing balance support: Bilateral upper extremity supported, During functional activity, Reliant on assistive device for balance Standing balance-Leahy Scale: Fair                             ADL either performed or assessed with clinical judgement   ADL Overall ADL's : Needs assistance/impaired                                       General ADL Comments: Anticipate pt to need assistance with LB dressing (MOD-MAX A), grooming (set up from sitting), showering (MOD-MAX A, seated).     Vision         Perception     Praxis      Pertinent Vitals/Pain Pain Assessment Pain Assessment: No/denies pain (Pt grimaced  with changes in position, but once resting pt reported no pain)     Hand Dominance Right   Extremity/Trunk Assessment Upper Extremity Assessment Upper Extremity Assessment: Overall WFL for tasks assessed (Pt able to grasp RW, able to move BIL UE WNL against gravity.)   Lower Extremity Assessment Lower Extremity Assessment: LLE deficits/detail LLE Deficits / Details: post surgical IMN   Cervical /  Trunk Assessment Cervical / Trunk Assessment: Normal   Communication Communication Communication: Expressive difficulties;Prefers language other than English (daughter-in-law assisting with translating/communicating with patient; per DIL, after pt had stroke in 2010 his [place] skills declined.)   Cognition Arousal/Alertness: Awake/alert Behavior During Therapy: WFL for tasks assessed/performed Overall Cognitive Status: History of cognitive impairments - at baseline                                 General Comments: Pleasant, follows cues with visual and verbal cues     General Comments  Pt requiring cues for walking forward a few feet with RW then turning to sit in recliner; pt taking hands off RW at times and requiring cues; overall MIN A-CGA standing balance.    Exercises     Shoulder Instructions      Home Living Family/patient expects to be discharged to:: Private residence Living Arrangements: Spouse/significant other;Children (Pt lives with wife and son and daughter-in-law) Available Help at Discharge: Family;Available 24 hours/day Type of Home: House Home Access: Ramped entrance     Home Layout: One level     Bathroom Shower/Tub: Occupational psychologist: Standard (BSC over toilet) Bathroom Accessibility: Yes   Home Equipment: [place] - single Barista (2 wheels);BSC/3in1;Wheelchair - manual;Shower seat   Additional Comments: pt DIL provides home set up information, reports 24/7 supervision      Prior Functioning/Environment Prior Level of Function : Needs assist  Cognitive Assist : ADLs (cognitive)   ADLs (Cognitive):  (Assist for all IADLs; dementia at baseline) Physical Assist : ADLs (physical)   ADLs (physical): Bathing Mobility Comments: amb with cane prior; DIL says that pt is "wobbly", but refused to use RW. ADLs Comments: perfomed toileting, dressing, grooming, eating without assistance; family assisted with bathing;  assist for IADL. walk-in shower with shower chair. Pt is a retired Chief Financial Officer.        OT Problem List: Decreased range of motion;Decreased activity tolerance;Impaired balance (sitting and/or standing);Decreased cognition      OT Treatment/Interventions: Self-care/ADL training;Therapeutic exercise;Therapeutic activities    OT Goals(Current goals can be found in the care plan section) Acute Rehab OT Goals Patient Stated Goal: Pt did not state OT Goal Formulation: With family Time For Goal Achievement: 07/04/22 Potential to Achieve Goals: Good ADL Goals Pt Will Perform Lower Body Bathing: with min assist;sit to/from stand Pt Will Perform Lower Body Dressing: with modified independence;sit to/from stand Pt Will Transfer to Toilet: with modified independence;ambulating;bedside commode Pt Will Perform Toileting - Clothing Manipulation and hygiene: with modified independence;sit to/from stand  OT Frequency: Min 2X/week    Co-evaluation PT/OT/SLP Co-Evaluation/Treatment: Yes Reason for Co-Treatment: To address functional/ADL transfers   OT goals addressed during session: ADL's and self-care      AM-PAC OT "6 Clicks" Daily Activity     Outcome Measure Help from another person eating meals?: None Help from another person taking care of personal grooming?: A Little Help from another person toileting, which includes using toliet, bedpan, or urinal?: A Little Help from another  person bathing (including washing, rinsing, drying)?: A Lot Help from another person to put on and taking off regular upper body clothing?: A Little Help from another person to put on and taking off regular lower body clothing?: A Lot 6 Click Score: 17   End of Session Equipment Utilized During Treatment: Gait belt;Rolling walker (2 wheels) Nurse Communication: Mobility status  Activity Tolerance: Patient tolerated treatment well Patient left: in chair;with chair alarm set;with family/visitor present  OT Visit  Diagnosis: History of falling (Z91.81)                Time: [id number] OT Time Calculation (min): 21 min Charges:  OT General Charges $OT Visit: 1 Visit OT Evaluation $OT Eval Moderate Complexity: 1 Mod  [place], MS, OTR/L  Vania Rea 06/20/2022, 12:45 PM

## 2022-06-20 NOTE — Progress Notes (Signed)
Physical Therapy Treatment Patient Details Name: Christopher Murphy MRN: [id number] DOB: Nov 26, 1945 Today's Date: 06/20/2022   History of Present Illness 77 year old with history of stroke and right hemiparesis, moderate vascular dementia who lives at home with his family presented 06/19/22 with mechanical fall, he lost his balance getting up from the couch and falling on his left hip.  In the emergency room hemodynamically stable.  Skeletal survey negative except left femoral neck fracture.  S/P L femur ORIF. WBAT.    PT Comments    Patient received seated on edge of bed with NT and family present. NT reports patient just ambulated about 10 feet with assistance. My session took over from there. Patient having more pain this pm with moaning during sit to stand transfer. Patient required min A and increased time. He was able to side step toward head of bed to have better positioning. Mod A for brining LEs up onto bed. Patient assisted with L LE exercises in bed. He will continue to benefit from skilled PT to improve mobility and independence.       Recommendations for follow up therapy are one component of a multi-disciplinary discharge planning process, led by the attending physician.  Recommendations may be updated based on patient status, additional functional criteria and insurance authorization.  Follow Up Recommendations  Home health PT     Assistance Recommended at Discharge Frequent or constant Supervision/Assistance  Patient can return home with the following A little help with bathing/dressing/bathroom;A lot of help with walking and/or transfers;Direct supervision/assist for medications management;Assist for transportation;Assistance with cooking/housework   Equipment Recommendations  None recommended by PT    Recommendations for Other Services       Precautions / Restrictions Precautions Precautions: Fall Restrictions Weight Bearing Restrictions: Yes LLE Weight Bearing:  Weight bearing as tolerated     Mobility  Bed Mobility Overal bed mobility: Needs Assistance Bed Mobility: Sit to Supine     Supine to sit: Mod assist Sit to supine: Mod assist   General bed mobility comments: requires assistance to bring LEs back up onto bed due to pain    Transfers Overall transfer level: Needs assistance Equipment used: Rolling walker (2 wheels) Transfers: Sit to/from Stand Sit to Stand: Min assist           General transfer comment: needing cues, increased time. patient took side steps at edge of bed    Ambulation/Gait Ambulation/Gait assistance: Min assist Gait Distance (Feet): 5 Feet Assistive device: Rolling walker (2 wheels) Gait Pattern/deviations: Decreased step length - right, Decreased step length - left, Step-to pattern Gait velocity: decreased     General Gait Details: patient ambulated just prior to my arrival with NT present in room and family.   Stairs             Wheelchair Mobility    Modified Rankin (Stroke Patients Only)       Balance Overall balance assessment: Needs assistance Sitting-balance support: Feet supported Sitting balance-Leahy Scale: Good     Standing balance support: Bilateral upper extremity supported, During functional activity, Reliant on assistive device for balance Standing balance-Leahy Scale: Fair                              Cognition Arousal/Alertness: Awake/alert Behavior During Therapy: WFL for tasks assessed/performed Overall Cognitive Status: History of cognitive impairments - at baseline  General Comments: Pleasant, follows cues with visual and verbal cues        Exercises Total Joint Exercises Ankle Circles/Pumps: AROM, Both, 10 reps Short Arc Quad: AAROM, Left, 10 reps Heel Slides: AAROM, Left, 10 reps Hip ABduction/ADduction: AAROM, Left, 10 reps Straight Leg Raises: AAROM, Left, 10 reps    General Comments  General comments (skin integrity, edema, etc.): Pt requiring cues for walking forward a few feet with RW then turning to sit in recliner; pt taking hands off RW at times and requiring cues; overall MIN A-CGA standing balance.      Pertinent Vitals/Pain Pain Assessment Pain Assessment: Faces Faces Pain Scale: Hurts little more Pain Descriptors / Indicators: Discomfort, Grimacing, Guarding, Moaning Pain Intervention(s): Monitored during session, Repositioned    Home Living Family/patient expects to be discharged to:: Private residence Living Arrangements: Spouse/significant other;Children (Pt lives with wife and son and daughter-in-law) Available Help at Discharge: Family;Available 24 hours/day Type of Home: House Home Access: Ramped entrance       Home Layout: One level Home Equipment: [place] - single Barista (2 wheels);BSC/3in1;Wheelchair - manual;Shower seat Additional Comments: pt DIL provides home set up information, reports 24/7 supervision    Prior Function            PT Goals (current goals can now be found in the care plan section) Acute Rehab PT Goals Patient Stated Goal: to return home PT Goal Formulation: With family Time For Goal Achievement: 07/04/22 Potential to Achieve Goals: Good Progress towards PT goals: Progressing toward goals    Frequency    BID      PT Plan Current plan remains appropriate    Co-evaluation   Reason for Co-Treatment: To address functional/ADL transfers   OT goals addressed during session: ADL's and self-care      AM-PAC PT "6 Clicks" Mobility   Outcome Measure  Help needed turning from your back to your side while in a flat bed without using bedrails?: A Lot Help needed moving from lying on your back to sitting on the side of a flat bed without using bedrails?: A Lot Help needed moving to and from a bed to a chair (including a wheelchair)?: A Little Help needed standing up from a chair using your arms (e.g.,  wheelchair or bedside chair)?: A Little Help needed to walk in hospital room?: A Little Help needed climbing 3-5 steps with a railing? : A Lot 6 Click Score: 15    End of Session Equipment Utilized During Treatment: Gait belt Activity Tolerance: Patient tolerated treatment well Patient left: in bed;with call bell/phone within reach;with bed alarm set;with family/visitor present Nurse Communication: Mobility status PT Visit Diagnosis: Difficulty in walking, not elsewhere classified (R26.2);Muscle weakness (generalized) (M62.81);History of falling (Z91.81);Pain Pain - Right/Left: Left Pain - part of body: Leg     Time: 1256-1311 PT Time Calculation (min) (ACUTE ONLY): 15 min  Charges:  $Therapeutic Exercise: 8-22 mins                     Aastha Dayley, PT, GCS 06/20/22,1:27 PM

## 2022-06-20 NOTE — Evaluation (Signed)
Physical Therapy Evaluation Patient Details Name: Christopher Murphy MRN: [id number] DOB: December 13, 1945 Today's Date: 06/20/2022  History of Present Illness  77 year old with history of stroke and right hemiparesis, moderate vascular dementia who lives at home with his family presented 06/19/22 with mechanical fall, he lost his balance getting up from the couch and falling on his left hip.  In the emergency room hemodynamically stable.  Skeletal survey negative except left femoral neck fracture.  S/P L femur ORIF. WBAT.  Clinical Impression  Patient received in bed, daughter in law present in room and willing to interpret. Patient seems slightly confused per his responses to questions. He is pleasant and agreeable to PT/OT assessments. Patient required mod A for bed mobility with some facial grimacing and min A +2 for sit to stand. Reporting no pain throughout session after seated on edge of bed. He is able to ambulate ~5 feet with RW and step by step instruction for walker use and sequencing. Patient will continue to benefit from skilled PT to improve functional independence, strength and safety with mobility.          Recommendations for follow up therapy are one component of a multi-disciplinary discharge planning process, led by the attending physician.  Recommendations may be updated based on patient status, additional functional criteria and insurance authorization.  Follow Up Recommendations Home health PT      Assistance Recommended at Discharge Frequent or constant Supervision/Assistance  Patient can return home with the following  A little help with bathing/dressing/bathroom;A lot of help with walking and/or transfers;Direct supervision/assist for medications management;Assist for transportation;Assistance with cooking/housework    Equipment Recommendations None recommended by PT (patient has equipment at home)  Recommendations for Other Services       Functional Status Assessment  Patient has had a recent decline in their functional status and demonstrates the ability to make significant improvements in function in a reasonable and predictable amount of time.     Precautions / Restrictions Precautions Precautions: Fall Restrictions Weight Bearing Restrictions: Yes LLE Weight Bearing: Weight bearing as tolerated      Mobility  Bed Mobility Overal bed mobility: Needs Assistance Bed Mobility: Supine to Sit     Supine to sit: Mod assist          Transfers Overall transfer level: Needs assistance Equipment used: Rolling walker (2 wheels) Transfers: Sit to/from Stand Sit to Stand: Min assist, +2 safety/equipment, +2 physical assistance                Ambulation/Gait Ambulation/Gait assistance: Min assist Gait Distance (Feet): 5 Feet Assistive device: Rolling walker (2 wheels) Gait Pattern/deviations: Decreased step length - right, Decreased step length - left, Step-to pattern Gait velocity: decreased     General Gait Details: patient requiring step by step instruction this session.  Stairs            Wheelchair Mobility    Modified Rankin (Stroke Patients Only)       Balance Overall balance assessment: Needs assistance Sitting-balance support: Feet supported Sitting balance-Leahy Scale: Good     Standing balance support: Bilateral upper extremity supported, During functional activity, Reliant on assistive device for balance Standing balance-Leahy Scale: Fair                               Pertinent Vitals/Pain Pain Assessment Pain Assessment: No/denies pain    Home Living Family/patient expects to be discharged to:: Private residence Living Arrangements:  Spouse/significant other;Children Available Help at Discharge: Family;Available 24 hours/day Type of Home: House Home Access: Ramped entrance       Home Layout: One level Home Equipment: [place] - single Barista (2 wheels);BSC/3in1;Wheelchair -  manual Additional Comments: pt DIL provides home set up information, reporst 24/7 supervision    Prior Function Prior Level of Function : Needs assist             Mobility Comments: amb with walking stick/spc with supervision ADLs Comments: perfomed toileting, dressing with supervison-MOD I, family assisted with bathing; assist for IADL     Hand Dominance   Dominant Hand: Right    Extremity/Trunk Assessment   Upper Extremity Assessment Upper Extremity Assessment: Defer to OT evaluation    Lower Extremity Assessment Lower Extremity Assessment: LLE deficits/detail LLE Deficits / Details: post surgical    Cervical / Trunk Assessment Cervical / Trunk Assessment: Normal  Communication   Communication: Expressive difficulties;Prefers language other than English  Cognition Arousal/Alertness: Awake/alert Behavior During Therapy: WFL for tasks assessed/performed Overall Cognitive Status: Difficult to assess                                          General Comments      Exercises     Assessment/Plan    PT Assessment Patient needs continued PT services  PT Problem List Decreased strength;Decreased activity tolerance;Decreased balance;Decreased mobility;Decreased knowledge of use of DME;Decreased cognition       PT Treatment Interventions DME instruction;Therapeutic exercise;Gait training;Balance training;Functional mobility training;Therapeutic activities;Patient/family education;Neuromuscular re-education;Modalities    PT Goals (Current goals can be found in the Care Plan section)  Acute Rehab PT Goals Patient Stated Goal: to return home PT Goal Formulation: With family Time For Goal Achievement: 07/04/22 Potential to Achieve Goals: Good    Frequency BID     Co-evaluation               AM-PAC PT "6 Clicks" Mobility  Outcome Measure Help needed turning from your back to your side while in a flat bed without using bedrails?: A Lot Help  needed moving from lying on your back to sitting on the side of a flat bed without using bedrails?: A Lot Help needed moving to and from a bed to a chair (including a wheelchair)?: A Little Help needed standing up from a chair using your arms (e.g., wheelchair or bedside chair)?: A Little Help needed to walk in hospital room?: A Little Help needed climbing 3-5 steps with a railing? : A Lot 6 Click Score: 15    End of Session Equipment Utilized During Treatment: Gait belt Activity Tolerance: Patient tolerated treatment well Patient left: in chair;with call bell/phone within reach;with chair alarm set;with family/visitor present Nurse Communication: Mobility status;Other (comment) (IV leaking) PT Visit Diagnosis: Difficulty in walking, not elsewhere classified (R26.2);Muscle weakness (generalized) (M62.81);History of falling (Z91.81)    Time: [id number] PT Time Calculation (min) (ACUTE ONLY): 24 min   Charges:   PT Evaluation $PT Eval Moderate Complexity: 1 Mod          Lassie Demorest, PT, GCS 06/20/22,10:52 AM

## 2022-06-20 NOTE — Progress Notes (Signed)
  Progress Note   Patient: Christopher Murphy [id number] DOB: 1945-06-16 DOA: 06/18/2022     1 DOS: the patient was seen and examined on 06/20/2022     Subjective: Patient seen and examined.   He was in the emergency room.   Patient underwent ORIF yesterday Been seen by physical therapist with recommendation for home health Patient's hemoglobin down trended from 11.8-8.3 today We will keep an eye on this and if it remains stable by tomorrow we will discharge patient    Brief Narrative:  77 year old with history of stroke and right hemiparesis, moderate vascular dementia who lives at home with his family presented with mechanical fall, he lost his balance getting up from the couch and falling on his left hip.  In the emergency room hemodynamically stable.  Skeletal survey negative except left femoral neck fracture.  Admitted with orthopedic consultation.     Assessment & Plan:   Close traumatic right femoral neck fracture: Patient underwent ORIF yesterday Orthopedic surgeon on board we appreciate input Continue PT OT   Chronic medical issues including Vascular dementia, on donepezil and Namenda.  Continued.  High risk of postop delirium.  All time fall precautions.  Delirium precautions. History of stroke with right hemiparesis: On aspirin and Crestor.  Continued.     Postoperative anemia We will keep an eye on this and if it remains stable by tomorrow we will discharge patient No indication for acute blood transfusion at this time Monitor CBC closely  DVT prophylaxis: SCDs Start: 06/19/22 0008     Code Status: Full code Family Communication: Daughter-in-law at the bedside Disposition Plan: Status is: Inpatient Remains inpatient appropriate because: Inpatient surgery planned       Consultants:  Orthopedics   Procedures:  ORIF done on 06/19/2022   Antimicrobials:  None       Physical Exam: Patient seen and examined at bedside this morning Surgical site  dressing appears clean and dry CVS heart sounds 1 and 2 present Abdomen appears nontender nondistended  Vitals:   06/20/22 0418 06/20/22 0818 06/20/22 1106 06/20/22 1224  BP: 117/68 109/68 (!) 92/52 107/62  Pulse: 64 66 74 72  Resp: 20 16 16    Temp: (!) 97.5 F (36.4 C) 97.6 F (36.4 C) 98 F (36.7 C)   TempSrc:      SpO2: 100% 100% 100%   Weight:      Height:        Data Reviewed: CBC reviewed by me shows hemoglobin have down trended from 11.8 To 8.3  Family Communication: Discussed with patient's daughter-in-law present at bedside  Disposition: Status is: Inpatient   Planned Discharge Destination: Home with Home Health    Time spent: 35 minutes  Author: Verline Lema, MD 06/20/2022 3:05 PM  For on call review [email or web address].

## 2022-06-20 NOTE — Progress Notes (Signed)
  Subjective: 1 Day Post-Op Procedure(s) (LRB): INTRAMEDULLARY (IM) NAIL INTERTROCHANTERIC (Left) Patient reports pain as mild.   Patient is well, and has had no acute complaints or problems Plan is to go Home versus rehab after hospital stay. Negative for chest pain and shortness of breath Fever: no Gastrointestinal: Negative for nausea and vomiting  Objective: Vital signs in last 24 hours: Temp:  [97.2 F (36.2 C)-98.2 F (36.8 C)] 97.5 F (36.4 C) (03/13 0418) Pulse Rate:  [64-95] 64 (03/13 0418) Resp:  [9-20] 20 (03/13 0418) BP: (99-149)/(58-88) 117/68 (03/13 0418) SpO2:  [98 %-100 %] 100 % (03/13 0418) Weight:  [65.8 kg] 65.8 kg (03/12 0951)  Intake/Output from previous day:  Intake/Output Summary (Last 24 hours) at 06/20/2022 0709 Last data filed at 06/20/2022 0635 Gross per 24 hour  Intake 913.17 ml  Output 1050 ml  Net -136.83 ml    Intake/Output this shift: No intake/output data recorded.  Labs: Recent Labs    06/18/22 2308  HGB 11.8*   Recent Labs    06/18/22 2308  WBC 16.5*  RBC 4.46  HCT 36.3*  PLT 233   Recent Labs    06/18/22 2308  NA 138  K 3.9  CL 105  CO2 27  BUN 17  CREATININE 0.86  GLUCOSE 103*  CALCIUM 8.4*   Recent Labs    06/18/22 2308  INR 1.1     EXAM General - Patient is Alert and Oriented Extremity - Neurovascular intact Sensation intact distally Compartments soft Dressing/Incision - clean dry with scant drainage proximally Motor Function - intact, moving foot and toes well on exam.   Past Medical History:  Diagnosis Date   Blood clots in brain 2009   Stroke ([place])     Assessment/Plan: 1 Day Post-Op Procedure(s) (LRB): INTRAMEDULLARY (IM) NAIL INTERTROCHANTERIC (Left) Principal Problem:   Closed left hip fracture (HCC) Active Problems:   History of recurrent CVA   Vascular dementia ([place])  Estimated body mass index is 20.81 kg/m as calculated from the following:   Height as of this encounter: 5\' 10"   (1.778 m).   Weight as of this encounter: 65.8 kg. Advance diet Up with therapy  Discharge planning: Follow-up at [facility] clinic orthopedics in 2 weeks for staple removal and x-rays of the left hip.  Dressing change as needed.  DVT Prophylaxis - Lovenox, Foot Pumps, and TED hose Weight-Bearing as tolerated to left leg  Reche Dixon, PA-C Orthopaedic Surgery 06/20/2022, 7:09 AM

## 2022-06-20 NOTE — Discharge Instructions (Signed)
INSTRUCTIONS AFTER Surgery  Remove items at home which could result in a fall. This includes throw rugs or furniture in walking pathways ICE to the affected joint every three hours while awake for 30 minutes at a time, for at least the first 3-5 days, and then as needed for pain and swelling.  Continue to use ice for pain and swelling. You may notice swelling that will progress down to the foot and ankle.  This is normal after surgery.  Elevate your leg when you are not up walking on it.   Continue to use the breathing machine you got in the hospital (incentive spirometer) which will help keep your temperature down.  It is common for your temperature to cycle up and down following surgery, especially at night when you are not up moving around and exerting yourself.  The breathing machine keeps your lungs expanded and your temperature down.   DIET:  As you were doing prior to hospitalization, we recommend a well-balanced diet.  DRESSING / WOUND CARE / SHOWERING  Skin changes needed.  Follow-up at [facility] clinic orthopedics in 2 weeks for staple removal and x-rays of the left hip.  No showering.  ACTIVITY  Increase activity slowly as tolerated, but follow the weight bearing instructions below.   No driving for 6 weeks or until further direction given by your physician.  You cannot drive while taking narcotics.  No lifting or carrying greater than 10 lbs. until further directed by your surgeon. Avoid periods of inactivity such as sitting longer than an hour when not asleep. This helps prevent blood clots.  You may return to work once you are authorized by your doctor.     WEIGHT BEARING  Weightbearing as tolerated on the left   EXERCISES Gait training and ambulation training with physical therapy.  CONSTIPATION  Constipation is defined medically as fewer than three stools per week and severe constipation as less than one stool per week.  Even if you have a regular bowel pattern at home,  your normal regimen is likely to be disrupted due to multiple reasons following surgery.  Combination of anesthesia, postoperative narcotics, change in appetite and fluid intake all can affect your bowels.   YOU MUST use at least one of the following options; they are listed in order of increasing strength to get the job done.  They are all available over the counter, and you may need to use some, POSSIBLY even all of these options:    Drink plenty of fluids (prune juice may be helpful) and high fiber foods Colace 100 mg by mouth twice a day  Senokot for constipation as directed and as needed Dulcolax (bisacodyl), take with full glass of water  Miralax (polyethylene glycol) once or twice a day as needed.  If you have tried all these things and are unable to have a bowel movement in the first 3-4 days after surgery call either your surgeon or your primary doctor.    If you experience loose stools or diarrhea, hold the medications until you stool forms back up.  If your symptoms do not get better within 1 week or if they get worse, check with your doctor.  If you experience "the worst abdominal pain ever" or develop nausea or vomiting, please contact the office immediately for further recommendations for treatment.   ITCHING:  If you experience itching with your medications, try taking only a single pain pill, or even half a pain pill at a time.  You can  also use Benadryl over the counter for itching or also to help with sleep.   TED HOSE STOCKINGS:  Use stockings on both legs until for at least 2 weeks or as directed by physician office. They may be removed at night for sleeping.  MEDICATIONS:  See your medication summary on the "After Visit Summary" that nursing will review with you.  You may have some home medications which will be placed on hold until you complete the course of blood thinner medication.  It is important for you to complete the blood thinner medication as  prescribed.  PRECAUTIONS:  If you experience chest pain or shortness of breath - call 911 immediately for transfer to the hospital emergency department.   If you develop a fever greater that 101 F, purulent drainage from wound, increased redness or drainage from wound, foul odor from the wound/dressing, or calf pain - CONTACT YOUR SURGEON.                                                   FOLLOW-UP APPOINTMENTS:  If you do not already have a post-op appointment, please call the office for an appointment to be seen by your surgeon.  Guidelines for how soon to be seen are listed in your "After Visit Summary", but are typically between 1-4 weeks after surgery.  OTHER INSTRUCTIONS:     MAKE SURE YOU:  Understand these instructions.  Get help right away if you are not doing well or get worse.    Thank you for letting us be a part of your medical care team.  It is a privilege we respect greatly.  We hope these instructions will help you stay on track for a fast and full recovery!

## 2022-06-20 NOTE — TOC Initial Note (Signed)
Transition of Care [facility]) - Initial/Assessment Note    Patient Details  Name: Christopher Murphy MRN: [id number] Date of Birth: 10-12-1945  [facility] CM/SW Contact:    Carol Ada, RN Phone Number: 06/20/2022, 1:24 PM  Clinical Narrative:  Chart reviewed.  I have meet with patient and his daughter-in-law Christopher Murphy at bedside today.    Daughter-in-laws reports that prior to admission patient lives at home with his wife, his son, daughter in-law which is [name] and 2 grand children.  Patient was able to get around the home with a rolling walker.  Patient also has a BSC and Wheelchair.  Christopher Murphy reports that family members transport patient to the appointments.  Patient does have a PCP.  Christopher Murphy reports that on discharge family will transport patient home.  Christopher Murphy reports that patient has had [place] before with [facility].  I have discussed with patient and Christopher Murphy PT/OT recommendations for home health PT/OT.  Christopher Murphy reports that patient would like to use [facility] services on discharge.    I have asked Tommi Rumps with [facility] to accept home health referral.  Tommi Rumps has accepted Home Health referral.   TOC will continue to follow for discharge planning.          Expected Discharge Plan: [place] Barriers to Discharge: No Barriers Identified   Patient Goals and CMS Choice     Choice offered to / list presented to :  (Patient's family reports that patient has used [facility] in the past and would like to use [place] [facility] PT.)      Expected Discharge Plan and Services   Discharge Planning Services: CM Consult Post Acute Care Choice: [place] arrangements for the past 2 months: Single Family Home                           HH Arranged: PT, OT HH Agency: [place] Date [place]: 06/20/22 Time HH Agency Contacted: 66 Representative spoke with at [place]: Tommi Rumps  Prior Living  Arrangements/Services Living arrangements for the past 2 months: [place] with:: Spouse (Patient lives with spouse, son and daughter in-law, and 2 granddaughters.) Patient language and need for interpreter reviewed:: Yes (Daughter- In-law at bedside and able to translate for patient.)        Need for Family Participation in Patient Care: Yes (Comment) Care giver support system in place?: Yes (comment) Current home services: DME (Pt. has a  RW, BSC, and Wheelchair)    Activities of Daily Living Home Assistive Devices/Equipment: Environmental consultant (specify type), Cane (specify quad or straight) ADL Screening (condition at time of admission) Patient's cognitive ability adequate to safely complete daily activities?: No Is the patient deaf or have difficulty hearing?: No Does the patient have difficulty seeing, even when wearing glasses/contacts?: No Does the patient have difficulty concentrating, remembering, or making decisions?: Yes (slightly confused at baseline) Patient able to express need for assistance with ADLs?: No Does the patient have difficulty dressing or bathing?: No (at baseline) Independently performs ADLs?: Yes (appropriate for developmental age) Does the patient have difficulty walking or climbing stairs?: Yes (LF hip postop, but baseline uses walker or stick) Weakness of Legs: Left Weakness of Arms/Hands: None  Permission Sought/Granted   Permission granted to share information with : Yes, Verbal Permission Granted              Emotional Assessment Appearance::  Appears stated age Attitude/Demeanor/Rapport: Engaged   Orientation: : Oriented to Self, Oriented to Place      Admission diagnosis:  Closed left hip fracture ([place]) [S72.002A] Closed fracture of left hip, initial encounter ([place]) [S72.002A] Fall, initial encounter [W19.XXXA] Patient Active Problem List   Diagnosis Date Noted   Closed left hip fracture ([place]) 06/19/2022   CVA (cerebrovascular  accident) ([place]) 03/11/2022   Hemiparesis affecting right side as late effect of cerebrovascular accident (CVA) ([place]) 03/10/2022   GERD (gastroesophageal reflux disease) 07/19/2021   Iron deficiency anemia secondary to inadequate dietary iron intake 01/23/2021   Aortic atherosclerosis ([place]) 01/23/2021   Thoracic aortic aneurysm ([place]) 06/02/2020   Vascular dementia ([place]) 10/28/2019   Chronic cough 10/19/2019   History of recurrent CVA 06/12/2016   TIA (transient ischemic attack) 04/05/2016   PCP:  [name], NP Pharmacy:   [id number] [id number]: [phone]: [phone]    Social Determinants of Health (SDOH) Social History: SDOH Screenings   Food Insecurity: No Food Insecurity (06/19/2022)  Housing: Low Risk  (06/19/2022)  Transportation Needs: No Transportation Needs (06/19/2022)  Utilities: Not At Risk (06/19/2022)  Depression (PHQ2-9): Low Risk  (05/23/2022)  Tobacco Use: Low Risk  (06/20/2022)   SDOH Interventions:     Readmission Risk Interventions     No data to display

## 2022-06-21 DIAGNOSIS — S72002D Fracture of unspecified part of neck of left femur, subsequent encounter for closed fracture with routine healing: Secondary | ICD-10-CM

## 2022-06-21 LAB — BASIC METABOLIC PANEL
Anion gap: 3 — ABNORMAL LOW (ref 5–15)
BUN: 15 mg/dL (ref 8–23)
CO2: 28 mmol/L (ref 22–32)
Calcium: 7.8 mg/dL — ABNORMAL LOW (ref 8.9–10.3)
Chloride: 107 mmol/L (ref 98–111)
Creatinine, Ser: 0.85 mg/dL (ref 0.61–1.24)
GFR, Estimated: >60 mL/min (ref >60)
Glucose, Bld: 91 mg/dL (ref 70–99)
Potassium: 3.8 mmol/L (ref 3.5–5.1)
Sodium: 138 mmol/L (ref 135–145)

## 2022-06-21 LAB — CBC
HCT: 24.2 % — ABNORMAL LOW (ref 39.0–52.0)
Hemoglobin: 8 g/dL — ABNORMAL LOW (ref 13.0–17.0)
MCH: 26.6 pg (ref 26.0–34.0)
MCHC: 33.1 g/dL (ref 30.0–36.0)
MCV: 80.4 fL (ref 80.0–100.0)
Platelets: 185 10*3/uL (ref 150–400)
RBC: 3.01 MIL/uL — ABNORMAL LOW (ref 4.22–5.81)
RDW: 14.1 % (ref 11.5–15.5)
WBC: 8.8 10*3/uL (ref 4.0–10.5)
nRBC: 0 % (ref 0.0–0.2)

## 2022-06-21 NOTE — Anesthesia Postprocedure Evaluation (Signed)
Anesthesia Post Note  Patient: Christopher Murphy  Procedure(s) Performed: INTRAMEDULLARY (IM) NAIL INTERTROCHANTERIC (Left: Leg Upper)  Patient location during evaluation: PACU Anesthesia Type: General Level of consciousness: awake and alert Pain management: pain level controlled Vital Signs Assessment: post-procedure vital signs reviewed and stable Respiratory status: spontaneous breathing, nonlabored ventilation, respiratory function stable and patient connected to nasal cannula oxygen Cardiovascular status: blood pressure returned to baseline and stable Postop Assessment: no apparent nausea or vomiting Anesthetic complications: no   No notable events documented.   Last Vitals:  Vitals:   06/20/22 2338 06/21/22 0734  BP: 124/70 113/60  Pulse: 71 73  Resp: 20 17  Temp: 36.6 C 36.6 C  SpO2: 99% 100%    Last Pain:  Vitals:   06/21/22 0953  TempSrc:   PainSc: 0-No pain                 Molli Barrows

## 2022-06-21 NOTE — TOC Progression Note (Addendum)
Transition of Care [facility]) - Progression Note    Patient Details  Name: Christopher Murphy MRN: [id number] Date of Birth: 03/18/1946  Transition of Care [facility]) CM/SW [place], RN Phone Number: 06/21/2022, 2:37 PM  Clinical Narrative:     Met with the patient and his son in the room, they have a wheelchair at home and will need leg rests, they will get the name of the company that the wheelchair came from and provide it to get the leg rests, he will need a hospital bed and a hoyer lift [employer] set up with for [facility], family support at home  [employer] to arrange and deliver the hosptial bed and hoyer lift  Expected Discharge Plan: [place] Barriers to Discharge: No Barriers Identified  Expected Discharge Plan and Services   Discharge Planning Services: CM Consult Post Acute Care Choice: [place] arrangements for the past 2 months: Single Family Home                           HH Arranged: PT, OT [facility] Agency: [place] Date [facility] Agency Contacted: 06/20/22 Time [place]: [id number] Representative spoke with at [place]: [place] ([place]) Interventions SDOH Screenings   Food Insecurity: No Food Insecurity (06/19/2022)  Housing: Low Risk  (06/19/2022)  Transportation Needs: No Transportation Needs (06/19/2022)  Utilities: Not At Risk (06/19/2022)  Depression (PHQ2-9): Low Risk  (05/23/2022)  Tobacco Use: Low Risk  (06/20/2022)    Readmission Risk Interventions     No data to display

## 2022-06-21 NOTE — Progress Notes (Signed)
  Progress Note   Patient: Christopher Murphy [id number] DOB: 1945/04/17 DOA: 06/18/2022     2 DOS: the patient was seen and examined on 06/21/2022    Subjective: Patient seen and examined.   He was in the emergency room.   Patient underwent ORIF on June 18, 2021 Patient was seen by physical therapist today and was found to be very unsteady Physical therapist wants to work with patient today and tomorrow and then discharge tomorrow afterwards Patient's family would like patient discharged tomorrow as well after physical therapy      Brief Narrative:  77 year old with history of stroke and right hemiparesis, moderate vascular dementia who lives at home with his family presented with mechanical fall, he lost his balance getting up from the couch and falling on his left hip.  In the emergency room hemodynamically stable.  Skeletal survey negative except left femoral neck fracture.  Patient is s/p ORIF     Assessment & Plan:   Close traumatic right femoral neck fracture: Patient underwent ORIF yesterday Orthopedic surgeon on board we appreciate input Continue PT OT Patient was seen by physical therapist today and was found to be very unsteady Physical therapist wants to work with patient today and tomorrow and then discharge tomorrow afterwards Patient's family would like patient discharged tomorrow as well after physical therapy   Chronic medical issues including Vascular dementia, on donepezil and Namenda.  Continued.  High risk of postop delirium.  All time fall precautions.  Delirium precautions. History of stroke with right hemiparesis: On aspirin and Crestor.  Continued.     Postoperative anemia We will keep an eye on this and if it remains stable by tomorrow we will discharge patient No indication for acute blood transfusion at this time Monitor CBC closely   DVT prophylaxis: SCDs Start: 06/19/22 0008     Code Status: Full code Family Communication: Daughter-in-law  at the bedside Disposition Plan: Status is: Inpatient Remains inpatient appropriate because: Inpatient surgery planned   Consultants:  Orthopedics   Procedures:  ORIF done on 06/19/2022   Antimicrobials:  None         Physical Exam: Patient seen and examined at bedside this morning Surgical site dressing appears clean and dry CVS heart sounds 1 and 2 present Abdomen appears nontender nondistended   Data Reviewed: Hemoglobin have remained stable today  Family Communication: Discussed with patient's daughter-in-law present at bedside   Disposition: Status is: Inpatient    Planned Discharge Destination: Home with Home Health  Vitals:   06/20/22 1224 06/20/22 1539 06/20/22 2338 06/21/22 0734  BP: 107/62 109/66 124/70 113/60  Pulse: 72 73 71 73  Resp:  18 20 17   Temp:  97.6 F (36.4 C) 97.8 F (36.6 C) 97.9 F (36.6 C)  TempSrc:      SpO2:  100% 99% 100%  Weight:      Height:       I spent a total of 35 minutes  Author: Verline Lema, MD 06/21/2022 3:35 PM  For on call review [email or web address].

## 2022-06-21 NOTE — Progress Notes (Signed)
Patient has hip fracture, right sided Hemiparesis, CVA  frequently requires frequent changes in body position which cannot be achieved with a normal bed.

## 2022-06-21 NOTE — Progress Notes (Signed)
Physical Therapy Treatment Patient Details Name: Christopher Murphy MRN: [id number] DOB: 11-Feb-1946 Today's Date: 06/21/2022   History of Present Illness 77 year old with history of stroke and right hemiparesis, moderate vascular dementia who lives at home with his family presented 06/19/22 with mechanical fall, he lost his balance getting up from the couch and falling on his left hip.  In the emergency room hemodynamically stable.  Skeletal survey negative except left femoral neck fracture.  S/P L femur ORIF. WBAT.    PT Comments    Pt was long sitting in bed upon arrival. He is alert but does have baseline cognition deficits. Supportive daughter in law in room and extremely helpful during session. Pt requires extensive time to exit bed. He is severely pain/ cognitively limited. Required a lot of assistance to exit bed, stand and ambulate ~ 12 ft around bed. Took ~ 10-15 minutes to ambulate 12 ft. Discussed concerns with pt's family who are planning to DC home while providing 24/7 assistance. Discussed case with care team. Plan for Dc home tomorrow. Will greatly benefit form PM/BID session + one more session in the morning prior to DC. Highly recommend continued skilled PT to maximize independence and safety with all ADLs.    Recommendations for follow up therapy are one component of a multi-disciplinary discharge planning process, led by the attending physician.  Recommendations may be updated based on patient status, additional functional criteria and insurance authorization.  Follow Up Recommendations  Home health PT     Assistance Recommended at Discharge Frequent or constant Supervision/Assistance  Patient can return home with the following A lot of help with walking and/or transfers;A lot of help with bathing/dressing/bathroom;Assistance with cooking/housework;Assistance with feeding;Direct supervision/assist for medications management;Direct supervision/assist for financial  management;Assist for transportation;Help with stairs or ramp for entrance   Equipment Recommendations  None recommended by PT       Precautions / Restrictions Precautions Precautions: Fall Restrictions Weight Bearing Restrictions: Yes LLE Weight Bearing: Weight bearing as tolerated     Mobility  Bed Mobility Overal bed mobility: Needs Assistance Bed Mobility: Supine to Sit  Supine to sit: Mod assist, Max assist, HOB elevated  General bed mobility comments: Pt requires extensive assistance to exit bed. increased time due to pain with incraeased assistance required today    Transfers Overall transfer level: Needs assistance Equipment used: Rolling walker (2 wheels) Transfers: Sit to/from Stand Sit to Stand: Mod assist  General transfer comment: mod assist to stand from EOB with amx vcs for technique and sequencing    Ambulation/Gait Ambulation/Gait assistance: Min assist Gait Distance (Feet): 12 Feet Assistive device: Rolling walker (2 wheels) Gait Pattern/deviations: Step-to pattern, Decreased step length - right, Decreased step length - left, Decreased stance time - right, Decreased stance time - left, Decreased stride length Gait velocity: decreased  General Gait Details: Pt was only able to tolerate ambulation around bed with extremely slow gait speed/cadnece. Needs vcs/tcs for step by step progression during gait   Balance Overall balance assessment: Needs assistance Sitting-balance support: Feet supported Sitting balance-Leahy Scale: Good     Standing balance support: Bilateral upper extremity supported, During functional activity, Reliant on assistive device for balance Standing balance-Leahy Scale: Fair Standing balance comment: reliant on RW for all standing activity. Pt is high fall risk       Cognition Arousal/Alertness: Awake/alert Behavior During Therapy: WFL for tasks assessed/performed Overall Cognitive Status: History of cognitive impairments - at  baseline      General Comments: Pt has cognition  deficits at baseline. Supportive family member able to assist with langage barriers               Pertinent Vitals/Pain Pain Assessment Pain Assessment: PAINAD Breathing: occasional labored breathing, short period of hyperventilation Negative Vocalization: occasional moan/groan, low speech, negative/disapproving quality Facial Expression: sad, frightened, frown Body Language: tense, distressed pacing, fidgeting Consolability: distracted or reassured by voice/touch PAINAD Score: 5 Pain Descriptors / Indicators: Discomfort, Grimacing, Guarding, Moaning Pain Intervention(s): Limited activity within patient's tolerance, Monitored during session, Repositioned     PT Goals (current goals can now be found in the care plan section) Acute Rehab PT Goals Patient Stated Goal: to return home Progress towards PT goals: Progressing toward goals    Frequency    BID      PT Plan Current plan remains appropriate       AM-PAC PT "6 Clicks" Mobility   Outcome Measure  Help needed turning from your back to your side while in a flat bed without using bedrails?: A Lot Help needed moving from lying on your back to sitting on the side of a flat bed without using bedrails?: A Lot Help needed moving to and from a bed to a chair (including a wheelchair)?: A Lot Help needed standing up from a chair using your arms (e.g., wheelchair or bedside chair)?: A Lot Help needed to walk in hospital room?: A Little Help needed climbing 3-5 steps with a railing? : A Lot 6 Click Score: 13    End of Session   Activity Tolerance: Patient tolerated treatment well;Patient limited by pain Patient left: in chair;with call bell/phone within reach;with chair alarm set;with nursing/sitter in room;with family/visitor present Nurse Communication: Mobility status PT Visit Diagnosis: Difficulty in walking, not elsewhere classified (R26.2);Muscle weakness  (generalized) (M62.81);History of falling (Z91.81);Pain Pain - Right/Left: Left Pain - part of body: Leg     Time: [id number] PT Time Calculation (min) (ACUTE ONLY): 25 min  Charges:  $Gait Training: 8-22 mins $Therapeutic Activity: 8-22 mins                     Julaine Fusi PTA 06/21/22, 11:40 AM

## 2022-06-21 NOTE — Plan of Care (Signed)
  Problem: Safety: Goal: Ability to remain free from injury will improve Outcome: Progressing   Problem: Skin Integrity: Goal: Risk for impaired skin integrity will decrease Outcome: Progressing   Problem: Activity: Goal: Risk for activity intolerance will decrease Outcome: Progressing   Problem: Pain Managment: Goal: General experience of comfort will improve Outcome: Progressing

## 2022-06-21 NOTE — Progress Notes (Signed)
Physical Therapy Treatment Patient Details Name: Christopher Murphy MRN: [id number] DOB: Apr 04, 1946 Today's Date: 06/21/2022   History of Present Illness 77 year old with history of stroke and right hemiparesis, moderate vascular dementia who lives at home with his family presented 06/19/22 with mechanical fall, he lost his balance getting up from the couch and falling on his left hip.  In the emergency room hemodynamically stable.  Skeletal survey negative except left femoral neck fracture.  S/P L femur ORIF. WBAT.    PT Comments    Pt was still sitting in recliner since AM session. He does endorse wanting to return to bed. Pt's supportive and extremely pleasant son present throughout session to assist with language barrier. Pt is pleasant but has severe cognition and motor planning/coordination deficits. He required extensive assistance to safely stand and take several steps to EOB from recliner. ~ 25 minutes to go 5 ft. Lengthy education and discussion about DC disposition. Family is still planning for pt to DC home tomorrow. Discussed equipment and demonstrated differences between lifts. Due to pt's inconsistent presentation, author recommends hoyer lift, hospital bed, w/c cushion, and elevating leg rest for home w/c. CM made aware. HHPT recommended at DC to maximize pt's independence while decreasing caregiver burden.    Recommendations for follow up therapy are one component of a multi-disciplinary discharge planning process, led by the attending physician.  Recommendations may be updated based on patient status, additional functional criteria and insurance authorization.  Follow Up Recommendations  Home health PT     Assistance Recommended at Discharge Frequent or constant Supervision/Assistance  Patient can return home with the following A lot of help with walking and/or transfers;A lot of help with bathing/dressing/bathroom;Assistance with cooking/housework;Direct supervision/assist for  medications management;Assistance with feeding;Assist for transportation;Direct supervision/assist for financial management;Help with stairs or ramp for entrance   Equipment Recommendations  Hospital bed;Wheelchair cushion (measurements PT);Other (comment) (hoyer lift, elevating leg rest and cushion for w/c .Marland KitchenMarland Kitchenpt has w/c at home already)       Precautions / Restrictions Precautions Precautions: Fall Restrictions Weight Bearing Restrictions: Yes LLE Weight Bearing: Weight bearing as tolerated     Mobility  Bed Mobility Overal bed mobility: Needs Assistance Bed Mobility: Sit to Supine  Supine to sit: Mod assist, Max assist, HOB elevated Sit to supine: Mod assist, Max assist General bed mobility comments: assisted BLEs into bed form EOB short sit    Transfers Overall transfer level: Needs assistance Equipment used: Rolling walker (2 wheels) Transfers: Sit to/from Stand Sit to Stand: Mod assist, Max assist  General transfer comment: pt stood 1 x from recliner with mod-max of one. Increased time to perform. pt's poor motor planning/ sever cognition deficits limiting more so than strength    Ambulation/Gait Ambulation/Gait assistance: Min assist Gait Distance (Feet): 5 Feet Assistive device: Rolling walker (2 wheels) Gait Pattern/deviations: Step-to pattern, Decreased step length - right, Decreased step length - left, Decreased stance time - right, Decreased stance time - left, Decreased stride length Gait velocity: decreased  General Gait Details: Pt was able to take several very slow antalgic step to steps form recliner to EOB    Balance Overall balance assessment: Needs assistance Sitting-balance support: Feet supported Sitting balance-Leahy Scale: Fair     Standing balance support: Bilateral upper extremity supported, During functional activity, Reliant on assistive device for balance Standing balance-Leahy Scale: Fair Standing balance comment: reliant on RW for all  standing activity. Pt is high fall risk       Cognition Arousal/Alertness: Awake/alert  Behavior During Therapy: WFL for tasks assessed/performed Overall Cognitive Status: History of cognitive impairments - at baseline      General Comments: Pt has cognition deficits at baseline. Supportive family member able to assist with langage barriers           General Comments General comments (skin integrity, edema, etc.): discussed at length with pt/pt's son DC needs/ equipment needs to maximize success at DC. demonstrated hoyer lift versus sara lift. discussed w/c use with elevating leg rest, hospital bed, etc      Pertinent Vitals/Pain Pain Assessment Pain Assessment: PAINAD Breathing: normal Negative Vocalization: occasional moan/groan, low speech, negative/disapproving quality Facial Expression: sad, frightened, frown Body Language: relaxed Consolability: distracted or reassured by voice/touch PAINAD Score: 3 Pain Descriptors / Indicators: Discomfort, Grimacing, Guarding, Moaning Pain Intervention(s): Limited activity within patient's tolerance, Monitored during session, Premedicated before session, Repositioned     PT Goals (current goals can now be found in the care plan section) Acute Rehab PT Goals Patient Stated Goal: go home Progress towards PT goals: Not progressing toward goals - comment    Frequency    BID      PT Plan Current plan remains appropriate       AM-PAC PT "6 Clicks" Mobility   Outcome Measure  Help needed turning from your back to your side while in a flat bed without using bedrails?: A Lot Help needed moving from lying on your back to sitting on the side of a flat bed without using bedrails?: A Lot Help needed moving to and from a bed to a chair (including a wheelchair)?: A Lot Help needed standing up from a chair using your arms (e.g., wheelchair or bedside chair)?: A Lot Help needed to walk in hospital room?: A Lot Help needed climbing 3-5  steps with a railing? : A Lot 6 Click Score: 12    End of Session Equipment Utilized During Treatment: Gait belt Activity Tolerance: Patient limited by pain;Other (comment) (limited by cognition) Patient left: in bed;with call bell/phone within reach;with bed alarm set;with family/visitor present Nurse Communication: Mobility status PT Visit Diagnosis: Difficulty in walking, not elsewhere classified (R26.2);Muscle weakness (generalized) (M62.81);History of falling (Z91.81);Pain Pain - Right/Left: Left Pain - part of body: Leg     Time: [id number] PT Time Calculation (min) (ACUTE ONLY): 46 min  Charges:  $Gait Training: 8-22 mins $Therapeutic Activity: 23-37 mins                    Julaine Fusi PTA 06/21/22, 2:56 PM

## 2022-06-21 NOTE — Progress Notes (Signed)
  Subjective: 2 Days Post-Op Procedure(s) (LRB): INTRAMEDULLARY (IM) NAIL INTERTROCHANTERIC (Left) Patient reports pain as mild.   Patient is well, and has had no acute complaints or problems Plan is to go Home with home health physical therapy after hospital stay. Negative for chest pain and shortness of breath Fever: no Gastrointestinal: Negative for nausea and vomiting  Objective: Vital signs in last 24 hours: Temp:  [97.6 F (36.4 C)-98 F (36.7 C)] 97.8 F (36.6 C) (03/13 2338) Pulse Rate:  [66-74] 71 (03/13 2338) Resp:  [16-20] 20 (03/13 2338) BP: (92-124)/(52-70) 124/70 (03/13 2338) SpO2:  [99 %-100 %] 99 % (03/13 2338)  Intake/Output from previous day:  Intake/Output Summary (Last 24 hours) at 06/21/2022 0731 Last data filed at 06/21/2022 0536 Gross per 24 hour  Intake --  Output 1950 ml  Net -1950 ml    Intake/Output this shift: No intake/output data recorded.  Labs: Recent Labs    06/18/22 2308 06/20/22 0701 06/21/22 0452  HGB 11.8* 8.3* 8.0*   Recent Labs    06/20/22 0701 06/21/22 0452  WBC 11.7* 8.8  RBC 3.11* 3.01*  HCT 25.0* 24.2*  PLT 167 185   Recent Labs    06/20/22 0701 06/21/22 0452  NA 135 138  K 3.7 3.8  CL 105 107  CO2 26 28  BUN 16 15  CREATININE 1.00 0.85  GLUCOSE 131* 91  CALCIUM 7.7* 7.8*   Recent Labs    06/18/22 2308  INR 1.1     EXAM General - Patient is Alert and Oriented Extremity - Neurovascular intact Sensation intact distally Compartments soft Dressing/Incision - clean dry with scant drainage proximally Motor Function - intact, moving foot and toes well on exam.  Ambulated 10 feet with physical therapy  Past Medical History:  Diagnosis Date   Blood clots in brain 2009   Stroke ([place])     Assessment/Plan: 2 Days Post-Op Procedure(s) (LRB): INTRAMEDULLARY (IM) NAIL INTERTROCHANTERIC (Left) Principal Problem:   Closed left hip fracture (HCC) Active Problems:   History of recurrent CVA   Vascular  dementia ([place])  Estimated body mass index is 20.81 kg/m as calculated from the following:   Height as of this encounter: 5\' 10"  (1.778 m).   Weight as of this encounter: 65.8 kg. Advance diet Up with therapy  Discharge planning: Home with home health physical therapy. Follow-[facility] at [facility] clinic orthopedics in 2 weeks for staple removal and x-rays of the left hip.  Dressing change as needed.  DVT Prophylaxis - Lovenox, Foot Pumps, and TED hose Weight-Bearing as tolerated to left leg  Reche Dixon, PA-C Orthopaedic Surgery 06/21/2022, 7:31 AM

## 2022-06-21 NOTE — Progress Notes (Signed)
Occupational Therapy Treatment Patient Details Name: Christopher Murphy MRN: [id number] DOB: 25-Jul-1945 Today's Date: 06/21/2022   History of present illness 77 year old with history of stroke and right hemiparesis, moderate vascular dementia who lives at home with his family presented 06/19/22 with mechanical fall, he lost his balance getting up from the couch and falling on his left hip.  In the emergency room hemodynamically stable.  Skeletal survey negative except left femoral neck fracture.  S/P L femur ORIF. WBAT.   OT comments  Pt received seated in recliner. Appearing alert; daughter-in-law in room; willing to work with OT on transfer to [facility]. Attempted t/f x10 with OT and RW as well as with daughter-in-law and OT providing HHA; unable to offweight from chair; OT notified PT. See flowsheet below for further details of session. Left seated in recliner with all needs in reach.  Patient will benefit from continued OT while in acute care. Pt is appropriate for rehab, but family wishing for home.    Recommendations for follow up therapy are one component of a multi-disciplinary discharge planning process, led by the attending physician.  Recommendations may be updated based on patient status, additional functional criteria and insurance authorization.    Follow Up Recommendations  Home health OT     Assistance Recommended at Discharge Frequent or constant Supervision/Assistance  Patient can return home with the following  A lot of help with walking and/or transfers;A lot of help with bathing/dressing/bathroom;Assistance with cooking/housework;Direct supervision/assist for medications management;Direct supervision/assist for financial management;Help with stairs or ramp for entrance;Assist for transportation   Equipment Recommendations  Other (comment) (may need a lift.)    Recommendations for Other Services      Precautions / Restrictions Precautions Precautions:  Fall Restrictions Weight Bearing Restrictions: Yes LLE Weight Bearing: Weight bearing as tolerated       Mobility Bed Mobility                    Transfers                         Balance   Sitting-balance support: Feet supported Sitting balance-Leahy Scale: Fair                                     ADL either performed or assessed with clinical judgement   ADL Overall ADL's : Needs assistance/impaired                                       General ADL Comments: Goal of session was to work on [employer] t/f to the [facility] next to recliner chair. Pt received in recliner, daughter-in-law in room and supportive throughout session. Pt needing MIN a to lean forward in chair and scoot to edge of chair. Pt several times leaning back into chair and needing cues to sit forward again. OT attempted to engage pt in sit to stand t/f with visual and tactile cues and daughter-in-law providing verbal cues in pt's native language; attempted RW approx 5 times; pt having difficulty sequencing where to place his hands and even with OT assistance was unable to successfully push up and off-weight from chair; daughter-in-law assisting with 5 more attempts at sit to stand with x2 handheld assist; pt not pushing down on hands to stand. Discussed with PT  after session and with daughter-in-law in session that pt will need a lift to d/c home if continues to be unable to transfer.    Extremity/Trunk Assessment Upper Extremity Assessment Upper Extremity Assessment: Overall WFL for tasks assessed   Lower Extremity Assessment Lower Extremity Assessment: Generalized weakness;Defer to PT evaluation;LLE deficits/detail LLE Deficits / Details: post surgical IMN        Vision       Perception     Praxis      Cognition Arousal/Alertness: Awake/alert Behavior During Therapy: WFL for tasks assessed/performed Overall Cognitive Status: History of cognitive impairments - at  baseline                                 General Comments: Pt has cognition deficits at baseline. Supportive family member able to assist with langage barriers        Exercises      Shoulder Instructions       General Comments Appears weaker today than yesterday. Discussed with daughter-in-law.    Pertinent Vitals/ Pain       Pain Assessment Pain Assessment: 0-10 Pain Score:  (pt endorses no pain at rest, only pain with movement) Pain Descriptors / Indicators: Discomfort, Grimacing, Guarding, Moaning Pain Intervention(s): Limited activity within patient's tolerance  Home Living                                          Prior Functioning/Environment              Frequency  Min 2X/week        Progress Toward Goals  OT Goals(current goals can now be found in the care plan section)  Progress towards OT goals: Not progressing toward goals - comment (pt appearing weaker today)  Acute Rehab OT Goals Patient Stated Goal: Unknown OT Goal Formulation: With family Time For Goal Achievement: 07/04/22 Potential to Achieve Goals: Good ADL Goals Pt Will Perform Lower Body Bathing: with min assist;sit to/from stand Pt Will Perform Lower Body Dressing: with modified independence;sit to/from stand Pt Will Transfer to Toilet: with modified independence;ambulating;bedside commode Pt Will Perform Toileting - Clothing Manipulation and hygiene: with modified independence;sit to/from stand  Plan Other (comment);Discharge plan remains appropriate;Frequency remains appropriate (pt is appropriate for SNF rehab, but family wishes to take pt home)    Co-evaluation                 AM-PAC OT "6 Clicks" Daily Activity     Outcome Measure   Help from another person eating meals?: None Help from another person taking care of personal grooming?: A Little Help from another person toileting, which includes using toliet, bedpan, or urinal?: Total Help  from another person bathing (including washing, rinsing, drying)?: A Lot Help from another person to put on and taking off regular upper body clothing?: A Little Help from another person to put on and taking off regular lower body clothing?: Total 6 Click Score: 14    End of Session Equipment Utilized During Treatment: Gait belt;Rolling walker (2 wheels)  OT Visit Diagnosis: History of falling (Z91.81)   Activity Tolerance Patient limited by pain;Patient limited by fatigue   Patient Left in chair;with family/visitor present;with call bell/phone within reach   Nurse Communication Mobility status        Time: [id number] OT Time  Calculation (min): 24 min  Charges: OT Treatments $Therapeutic Activity: 23-37 mins  Waymon Amato, MS, OTR/L   Vania Rea 06/21/2022, 12:06 PM

## 2022-06-21 NOTE — Consult Note (Signed)
   [facility] CM Inpatient Consult   06/21/2022  Christopher Murphy [birth year] [id number]  Orientation with [name], [facility] Liaison for review.   Location: [facility] Liaison spoke with [name]   [name] Patient: Nurse, adult)    Primary Care Provider:  [name], NP With [facility]  Patient (pt) screened for hospital readmission with noted medium risk score  2 IP within 6 months for unplanned readmission risk. Pt  assessed for potential [facility]) Care Management service needs for post hospital transition for care coordination. Introduced [facility] services and offered care coordination services post hospital follow (receptive).    Plan:  HIPAA verified. TOC indicates pt will have [facility] with [facility]. [facility] RN will continue to follow ongoing disposition to assess for post hospital community care coordination/management needs. Will request a referral for community care coordination closer to discharge for prevention readmission with care coordinator.   [place] does not replace or interfere with any arrangements made by the Inpatient Transition of Care team.   For questions contact:    Raina Mina, RN, [place] Hours M-F 8:00 am to 5 pm [phone] mobile [phone] [Office toll free line][facility] Office Hours are M-F 8:30 - 5 pm 24 hour nurse advise line [phone] Conceirge  [email or web address]

## 2022-06-22 DIAGNOSIS — S72002D Fracture of unspecified part of neck of left femur, subsequent encounter for closed fracture with routine healing: Secondary | ICD-10-CM | POA: Diagnosis not present

## 2022-06-22 LAB — BASIC METABOLIC PANEL
Anion gap: 8 (ref 5–15)
BUN: 17 mg/dL (ref 8–23)
CO2: 26 mmol/L (ref 22–32)
Calcium: 8.1 mg/dL — ABNORMAL LOW (ref 8.9–10.3)
Chloride: 104 mmol/L (ref 98–111)
Creatinine, Ser: 0.82 mg/dL (ref 0.61–1.24)
GFR, Estimated: >60 mL/min (ref >60)
Glucose, Bld: 95 mg/dL (ref 70–99)
Potassium: 4.1 mmol/L (ref 3.5–5.1)
Sodium: 138 mmol/L (ref 135–145)

## 2022-06-22 LAB — CBC
HCT: 25 % — ABNORMAL LOW (ref 39.0–52.0)
Hemoglobin: 8.1 g/dL — ABNORMAL LOW (ref 13.0–17.0)
MCH: 26.8 pg (ref 26.0–34.0)
MCHC: 32.4 g/dL (ref 30.0–36.0)
MCV: 82.8 fL (ref 80.0–100.0)
Platelets: 208 10*3/uL (ref 150–400)
RBC: 3.02 MIL/uL — ABNORMAL LOW (ref 4.22–5.81)
RDW: 14.5 % (ref 11.5–15.5)
WBC: 9.6 10*3/uL (ref 4.0–10.5)
nRBC: 0 % (ref 0.0–0.2)

## 2022-06-22 MED ORDER — ASPIRIN 81 MG PO TBEC: 81.0000 mg | DELAYED_RELEASE_TABLET | Freq: Every day | ORAL | 12 refills | Status: DC

## 2022-06-22 MED ORDER — BISACODYL 10 MG RE SUPP: 10.0000 mg | Freq: Every day | RECTAL | 0 refills | Status: DC | PRN

## 2022-06-22 MED ORDER — ADULT MULTIVITAMIN W/MINERALS CH: 1.0000 | ORAL_TABLET | Freq: Every day | ORAL | 1 refills | Status: DC

## 2022-06-22 MED ORDER — DOCUSATE SODIUM 100 MG PO CAPS: 100.0000 mg | ORAL_CAPSULE | Freq: Two times a day (BID) | ORAL | 0 refills | Status: DC

## 2022-06-22 NOTE — Care Management Important Message (Signed)
Important Message  Patient Details  Name: Christopher Murphy MRN: [id number] Date of Birth: 1945-10-05   Medicare Important Message Given:  Yes     Dannette Barbara 06/22/2022, 11:10 AM

## 2022-06-22 NOTE — TOC Progression Note (Signed)
Transition of Care [facility]) - Progression Note    Patient Details  Name: Christopher Murphy MRN: [id number] Date of Birth: 1946-02-28  Transition of Care [facility]) CM/SW [place], RN Phone Number: 06/22/2022, 1:11 PM  Clinical Narrative:     Met with the patient and his son in the room, His son will transport him home I reached out to [facility] about the hoyer lift and the Hospital Bed, he tells me that they will deliver today, I asked that they call the family and let them know, he stated hat they would   Expected Discharge Plan: [place] Barriers to Discharge: No Barriers Identified  Expected Discharge Plan and Services   Discharge Planning Services: CM Consult Post Acute Care Choice: [place] arrangements for the past 2 months: Single Family Home Expected Discharge Date: 06/22/22                         HH Arranged: PT, OT HH Agency: [facility] Date [facility] Agency Contacted: 06/20/22 Time [place]: [id number] Representative spoke with at [place]: [place] Determinants of Health ([place]) Interventions SDOH Screenings   Food Insecurity: No Food Insecurity (06/19/2022)  Housing: Low Risk  (06/19/2022)  Transportation Needs: No Transportation Needs (06/19/2022)  Utilities: Not At Risk (06/19/2022)  Depression (PHQ2-9): Low Risk  (05/23/2022)  Tobacco Use: Low Risk  (06/20/2022)    Readmission Risk Interventions     No data to display

## 2022-06-22 NOTE — Discharge Summary (Signed)
Physician Discharge Summary   Patient: Brekyn Vangorder MRN: [id number] DOB: 1945-05-05  Admit date:     06/18/2022  Discharge date: 06/22/22  Discharge Physician: Verline Lema   PCP: [name], NP    Discharge Diagnoses: Close traumatic right femoral neck fracture: Vascular dementia History of stroke with right hemiparesis:  Postoperative anemia  Hospital Course: 77 year old with history of stroke and right hemiparesis, moderate vascular dementia who lives at home with his family presented with mechanical fall, he lost his balance getting up from the couch and falling on his left hip.  In the emergency room hemodynamically stable.  Skeletal survey negative except left femoral neck fracture.  Patient is s/p ORIF.  Has been seen by orthopedic surgeon as well as PT OT and patient is cleared to be discharged today and to follow-up with orthopedic surgeon.   Consultants: [employer] Procedures performed: Open reduction and internal fixation Disposition: Home health Diet recommendation:  Discharge Diet Orders (From admission, onward)     Start     Ordered   06/22/22 0000  Diet - low sodium heart healthy        06/22/22 1228           Cardiac diet DISCHARGE MEDICATION: Allergies as of 06/22/2022   No Known Allergies      Medication List     STOP taking these medications    amoxicillin-clavulanate 875-125 MG tablet Commonly known as: AUGMENTIN   cetirizine 10 MG tablet Commonly known as: ZYRTEC   predniSONE 10 MG tablet Commonly known as: DELTASONE   Sarna lotion Generic drug: camphor-menthol   trimethoprim-polymyxin b ophthalmic solution Commonly known as: Polytrim       TAKE these medications    albuterol 108 (90 Base) MCG/ACT inhaler Commonly known as: VENTOLIN HFA Inhale 2 puffs into the lungs every 6 (six) hours as needed for wheezing or shortness of breath.   aspirin EC 81 MG tablet Commonly known as: Aspirin Low Dose Take 1 tablet (81 mg  total) by mouth daily. Swallow whole. Start taking on: June 23, 2022 What changed:  how much to take additional instructions   benzonatate 100 MG capsule Commonly known as: Tessalon Perles Take 1 capsule (100 mg total) by mouth 3 (three) times daily as needed.   bisacodyl 10 MG suppository Commonly known as: DULCOLAX Place 1 suppository (10 mg total) rectally daily as needed for moderate constipation.   docusate sodium 100 MG capsule Commonly known as: COLACE Take 1 capsule (100 mg total) by mouth 2 (two) times daily.   donepezil 10 MG tablet Commonly known as: ARICEPT TAKE 1 TABLET BY MOUTH EVERYDAY AT BEDTIME   enoxaparin 40 MG/0.4ML injection Commonly known as: LOVENOX Inject 0.4 mLs (40 mg total) into the skin daily for 14 days.   memantine 10 MG tablet Commonly known as: NAMENDA TAKE 1 TABLET BY MOUTH TWICE A DAY   methocarbamol 500 MG tablet Commonly known as: ROBAXIN Take 1 tablet (500 mg total) by mouth every 6 (six) hours as needed for muscle spasms.   multivitamin with minerals Tabs tablet Take 1 tablet by mouth daily. Start taking on: June 23, 2022   ondansetron 4 MG tablet Commonly known as: ZOFRAN Take 1 tablet (4 mg total) by mouth every 6 (six) hours as needed for nausea.   oxyCODONE 5 MG immediate release tablet Commonly known as: Oxy IR/ROXICODONE Take 0.5-1 tablets (2.5-5 mg total) by mouth every 4 (four) hours as needed for moderate pain (pain  score 4-6).   pantoprazole 40 MG tablet Commonly known as: PROTONIX TAKE 1 TABLET BY MOUTH EVERY DAY   Polyethyl Glycol-Propyl Glycol 0.4-0.3 % Soln Commonly known as: Systane Apply 1 drop to eye 2 (two) times daily. What changed: how to take this   polyethylene glycol 17 g packet Commonly known as: MIRALAX / GLYCOLAX Take 17 g by mouth daily.   promethazine-dextromethorphan 6.25-15 MG/5ML syrup Commonly known as: PROMETHAZINE-DM Take 5 mLs by mouth 4 (four) times daily as needed.    rosuvastatin 20 MG tablet Commonly known as: CRESTOR TAKE 1 TABLET BY MOUTH EVERY DAY   traMADol 50 MG tablet Commonly known as: ULTRAM Take 1 tablet (50 mg total) by mouth every 6 (six) hours as needed for moderate pain.               Durable Medical Equipment  (From admission, onward)           Start     Ordered   06/21/22 1440  For home use only DME Hospital bed  Once       Question Answer Comment  Length of Need 12 Months   Patient has (list medical condition): Hip Fracture, right sided hemiparesis, CVA Vascular Dementia   The above medical condition requires: Patient requires the ability to reposition frequently   Bed type Semi-electric   Hoyer Lift Yes   Support Surface: Gel Overlay      06/21/22 1443            Follow-up Information     Reche Dixon, PA-C Follow up in 2 week(s).   Specialty: Orthopedic Surgery Why: For staple removal and x-rays of the left hip Contact information: [place] [address] [phone]                 Filed Weights   06/19/22 0951  Weight: 65.8 kg   Physical Exam: Patient seen and examined at bedside this morning Surgical site dressing appears clean and dry CVS heart sounds 1 and 2 present Abdomen appears nontender nondistended  Condition at discharge: good  Discharge time spent: greater than 30 minutes.  Signed: Verline Lema, MD [facility] 06/22/2022

## 2022-06-22 NOTE — Progress Notes (Signed)
Physical Therapy Treatment Patient Details Name: Christopher Murphy MRN: [id number] DOB: 02/21/1946 Today's Date: 06/22/2022   History of Present Illness 77 year old with history of stroke and right hemiparesis, moderate vascular dementia who lives at home with his family presented 06/19/22 with mechanical fall, he lost his balance getting up from the couch and falling on his left hip.  In the emergency room hemodynamically stable.  Skeletal survey negative except left femoral neck fracture.  S/P L femur ORIF. WBAT.    PT Comments    Pt was long sitting in bed upon arrival. He is alert but continues to present with severe cognition deficits. Decreased initiation of movements and unable to follow even simple commands. Pt's son was present throughout session and very supportive/help. Pt still unable to actively perform desired task. He required max assist to move LEs however eventually required total assist to achieve EOB short sit. Max assist to stand at EOB to RW. Author encouraged pt to advance away form EOB but pt is unable to take steps due to poor motor coordination + pain in LLE. Per lengthy discussion with care team and pt's family, DC recs being updated to SNF. Family does not feel they can provide the level of assistance pt currently requires. DC recs being updated to SNF to assist pt to PLOF. He will benefit from the more aggressive therapy that [facility] services will not be able to provide. Author issued HEP for son to work on with pt to promote return in strength and function of LLE.    Recommendations for follow up therapy are one component of a multi-disciplinary discharge planning process, led by the attending physician.  Recommendations may be updated based on patient status, additional functional criteria and insurance authorization.  Follow Up Recommendations  Skilled nursing-short term rehab (<3 hours/day)     Assistance Recommended at Discharge Frequent or constant  Supervision/Assistance  Patient can return home with the following A lot of help with walking and/or transfers;A lot of help with bathing/dressing/bathroom;Assistance with cooking/housework;Direct supervision/assist for medications management;Assistance with feeding;Assist for transportation;Direct supervision/assist for financial management;Help with stairs or ramp for entrance   Equipment Recommendations  Other (comment) (ongoing assessment. Defer to next level of care)       Precautions / Restrictions Precautions Precautions: Fall Restrictions Weight Bearing Restrictions: Yes LLE Weight Bearing: Weight bearing as tolerated     Mobility  Bed Mobility Overal bed mobility: Needs Assistance Bed Mobility: Supine to Sit  Supine to sit: Max assist, Total assist  General bed mobility comments: Max assist at first that eventually led to total assist due to pt's inability to follow commands or perform desired task requested on him    Transfers Overall transfer level: Needs assistance Equipment used: Rolling walker (2 wheels) Transfers: Sit to/from Stand Sit to Stand: Max assist    General transfer comment: Max assist to stand EOB. pt unable to progress away from EOB due to inability to take a step (motor plan/pain limited)    Ambulation/Gait  General Gait Details: Max attempt and encouragement to progress to gait but pt was unable. Pt has severe motor coordination deficits in addition to pain limiting   Balance Overall balance assessment: Needs assistance Sitting-balance support: Feet supported Sitting balance-Leahy Scale: Poor Sitting balance - Comments: More severe posterior push   Standing balance support: Bilateral upper extremity supported, During functional activity, Reliant on assistive device for balance Standing balance-Leahy Scale: Fair Standing balance comment: reliant on RW for all standing activity. Pt is high  fall risk       Cognition Arousal/Alertness:  Awake/alert Behavior During Therapy: WFL for tasks assessed/performed Overall Cognitive Status: History of cognitive impairments - at baseline    General Comments: Pt has cognition deficits at baseline. Supportive family member able to assist with langage barriers           General Comments General comments (skin integrity, edema, etc.): lengthy discussion with very supportive son. Son does not feel he can provide the level of care pt currently requires for safe DC home. DC recs being updated to SNF.      Pertinent Vitals/Pain Pain Assessment Pain Assessment: PAINAD Breathing: occasional labored breathing, short period of hyperventilation Negative Vocalization: occasional moan/groan, low speech, negative/disapproving quality Facial Expression: sad, frightened, frown Body Language: relaxed Consolability: distracted or reassured by voice/touch PAINAD Score: 4 Pain Descriptors / Indicators: Discomfort, Grimacing, Guarding, Moaning Pain Intervention(s): Limited activity within patient's tolerance, Monitored during session, Premedicated before session, Repositioned     PT Goals (current goals can now be found in the care plan section) Acute Rehab PT Goals Patient Stated Goal: none stated Progress towards PT goals: Not progressing toward goals - comment (cognition, pain, and language barriers greatly limiting progress with Pt)    Frequency    BID      PT Plan Discharge plan needs to be updated       AM-PAC PT "6 Clicks" Mobility   Outcome Measure  Help needed turning from your back to your side while in a flat bed without using bedrails?: Total Help needed moving from lying on your back to sitting on the side of a flat bed without using bedrails?: Total Help needed moving to and from a bed to a chair (including a wheelchair)?: A Lot Help needed standing up from a chair using your arms (e.g., wheelchair or bedside chair)?: A Lot Help needed to walk in hospital room?:  Total Help needed climbing 3-5 steps with a railing? : Total 6 Click Score: 8    End of Session Equipment Utilized During Treatment: Gait belt Activity Tolerance: Patient tolerated treatment well Patient left: in chair;with call bell/phone within reach;with chair alarm set;with family/visitor present Nurse Communication: Mobility status PT Visit Diagnosis: Difficulty in walking, not elsewhere classified (R26.2);Muscle weakness (generalized) (M62.81);History of falling (Z91.81);Pain Pain - Right/Left: Left Pain - part of body: Leg     Time: [id number] PT Time Calculation (min) (ACUTE ONLY): 41 min  Charges:  $Therapeutic Exercise: 8-22 mins $Therapeutic Activity: 23-37 mins                    Julaine Fusi PTA 06/22/22, 2:00 PM

## 2022-06-22 NOTE — Progress Notes (Addendum)
Physical Therapy Treatment Patient Details Name: Christopher Murphy MRN: [id number] DOB: [birth year] Today's Date: 06/22/2022   History of Present Illness 77 year old with history of stroke and right hemiparesis, moderate vascular dementia who lives at home with his family presented 06/19/22 with mechanical fall, he lost his balance getting up from the couch and falling on his left hip.  In the emergency room hemodynamically stable.  Skeletal survey negative except left femoral neck fracture.  S/P L femur ORIF. WBAT.    PT Comments    Pt was still sitting in recliner from previous session. He does endorse feeling ready to return to bed. Supportive son (translated during session) still present and continues to be very supportive and helpful. Pt is unable to rate pain but does have grimacing/moans with any/all movements and even with anticipation of movements. Max assist to stand pivot from recliner to EOB. Pt struggles initiating movements but once he tries/understands desired task, does assist some. Pt is making limited progress with PT due to pain and cognition deficits. DC recs for SNF remain most appropriate due to pt's limited abilities + care needs. Will decrease frequency to QD. He is far from his baseline abilities and will benefit from continued skilled PT to maximize independence and safety with ADLs.     Recommendations for follow up therapy are one component of a multi-disciplinary discharge planning process, led by the attending physician.  Recommendations may be updated based on patient status, additional functional criteria and insurance authorization.  Follow Up Recommendations  Skilled nursing-short term rehab (<3 hours/day)     Assistance Recommended at Discharge Frequent or constant Supervision/Assistance  Patient can return home with the following A lot of help with walking and/or transfers;A lot of help with bathing/dressing/bathroom;Assistance with  cooking/housework;Assistance with feeding;Direct supervision/assist for medications management;Direct supervision/assist for financial management;Assist for transportation;Help with stairs or ramp for entrance   Equipment Recommendations  Other (comment) (Defer to next level of care)       Precautions / Restrictions Precautions Precautions: Fall Restrictions Weight Bearing Restrictions: Yes LLE Weight Bearing: Weight bearing as tolerated     Mobility  Bed Mobility Overal bed mobility: Needs Assistance Bed Mobility: Sit to Supine  Supine to sit: Max assist, Total assist Sit to supine: Max assist General bed mobility comments: Max assist progressing BLEs    Transfers Overall transfer level: Needs assistance Equipment used: None Transfers: Bed to chair/wheelchair/BSC Sit to Stand: Max assist Stand pivot transfers: Max assist  General transfer comment: Pt required max assist to safely stand pivot from recliner to EOB. gait belt + Max vcs. Pt's cognition continues to limit session progression    Ambulation/Gait    General Gait Details: unable   Balance Overall balance assessment: Needs assistance Sitting-balance support: Feet supported Sitting balance-Leahy Scale: Poor Sitting balance - Comments: More severe posterior push   Standing balance support: Bilateral upper extremity supported, During functional activity, Reliant on assistive device for balance Standing balance-Leahy Scale: Poor Standing balance comment: reliant on RW for all standing activity. Pt is high fall risk       Cognition Arousal/Alertness: Awake/alert Behavior During Therapy: WFL for tasks assessed/performed Overall Cognitive Status: History of cognitive impairments - at baseline    General Comments: Pt has cognition deficits at baseline. Supportive family member able to assist with langage barriers           General Comments General comments (skin integrity, edema, etc.): Reviewed process of SNF  referrals and that bed offers will be present  to son once ofers have been made.      Pertinent Vitals/Pain Pain Assessment Pain Assessment: PAINAD Breathing: occasional labored breathing, short period of hyperventilation Negative Vocalization: occasional moan/groan, low speech, negative/disapproving quality Facial Expression: sad, frightened, frown Body Language: relaxed Consolability: distracted or reassured by voice/touch PAINAD Score: 4 Pain Descriptors / Indicators: Discomfort, Grimacing, Guarding, Moaning Pain Intervention(s): Limited activity within patient's tolerance, Monitored during session, Repositioned     PT Goals (current goals can now be found in the care plan section) Acute Rehab PT Goals Patient Stated Goal: none stated Progress towards PT goals: Not progressing toward goals - comment    Frequency    7X/week      PT Plan Frequency needs to be updated       AM-PAC PT "6 Clicks" Mobility   Outcome Measure  Help needed turning from your back to your side while in a flat bed without using bedrails?: A Lot Help needed moving from lying on your back to sitting on the side of a flat bed without using bedrails?: A Lot Help needed moving to and from a bed to a chair (including a wheelchair)?: A Lot Help needed standing up from a chair using your arms (e.g., wheelchair or bedside chair)?: A Lot Help needed to walk in hospital room?: Total Help needed climbing 3-5 steps with a railing? : Total 6 Click Score: 10    End of Session Equipment Utilized During Treatment: Gait belt Activity Tolerance: Patient limited by pain;Other (comment) (limited by cognition and poor motor planning/coordination) Patient left: in bed;with call bell/phone within reach;with nursing/sitter in room Nurse Communication: Mobility status PT Visit Diagnosis: Difficulty in walking, not elsewhere classified (R26.2);Muscle weakness (generalized) (M62.81);History of falling (Z91.81);Pain Pain  - Right/Left: Left Pain - part of body: Leg     Time: [id number] PT Time Calculation (min) (ACUTE ONLY): 12 min  Charges:  $Therapeutic Exercise: 8-22 mins $Therapeutic Activity: 8-22 mins                     Julaine Fusi PTA 06/22/22, 3:49 PM

## 2022-06-22 NOTE — Progress Notes (Signed)
  Subjective: 3 Days Post-Op Procedure(s) (LRB): INTRAMEDULLARY (IM) NAIL INTERTROCHANTERIC (Left) Patient reports pain as mild.  Rest well.  Family staying with him. Patient is well, and has had no acute complaints or problems Plan is to go Home with home health physical therapy after hospital stay. Negative for chest pain and shortness of breath Fever: no Gastrointestinal: Negative for nausea and vomiting  Objective: Vital signs in last 24 hours: Temp:  [97.4 F (36.3 C)-98.3 F (36.8 C)] 98.3 F (36.8 C) (03/14 2352) Pulse Rate:  [73-78] 77 (03/14 2352) Resp:  [17-20] 20 (03/14 2352) BP: (113-126)/(60-65) 126/65 (03/14 2352) SpO2:  [98 %-100 %] 98 % (03/14 2352)  Intake/Output from previous day:  Intake/Output Summary (Last 24 hours) at 06/22/2022 0654 Last data filed at 06/22/2022 0600 Gross per 24 hour  Intake 240 ml  Output 700 ml  Net -460 ml    Intake/Output this shift: Total I/O In: -  Out: 300 [Urine:300]  Labs: Recent Labs    06/20/22 0701 06/21/22 0452 06/22/22 0630  HGB 8.3* 8.0* 8.1*   Recent Labs    06/21/22 0452 06/22/22 0630  WBC 8.8 9.6  RBC 3.01* 3.02*  HCT 24.2* 25.0*  PLT 185 208   Recent Labs    06/20/22 0701 06/21/22 0452  NA 135 138  K 3.7 3.8  CL 105 107  CO2 26 28  BUN 16 15  CREATININE 1.00 0.85  GLUCOSE 131* 91  CALCIUM 7.7* 7.8*   No results for input(s): "LABPT", "INR" in the last 72 hours.    EXAM General - Patient is Alert and Oriented Extremity - Neurovascular intact Sensation intact distally Compartments soft Dressing/Incision - clean dry with scant drainage proximally Motor Function - intact, moving foot and toes well on exam.  Ambulated 10 feet with physical therapy  Past Medical History:  Diagnosis Date   Blood clots in brain 2009   Stroke ([place])     Assessment/Plan: 3 Days Post-Op Procedure(s) (LRB): INTRAMEDULLARY (IM) NAIL INTERTROCHANTERIC (Left) Principal Problem:   Closed left hip fracture  (HCC) Active Problems:   History of recurrent CVA   Vascular dementia ([place])  Estimated body mass index is 20.81 kg/m as calculated from the following:   Height as of this encounter: 5\' 10"  (1.778 m).   Weight as of this encounter: 65.8 kg. Advance diet Up with therapy  Discharge planning: Home with home health physical therapy.  Probable discharge today. Follow-up at [facility] clinic orthopedics in 2 weeks for staple removal and x-rays of the left hip.  Dressing change as needed.  DVT Prophylaxis - Lovenox, Foot Pumps, and TED hose Weight-Bearing as tolerated to left leg  Reche Dixon, PA-C Orthopaedic Surgery 06/22/2022, 6:54 AM

## 2022-06-22 NOTE — Plan of Care (Signed)
  Problem: Health Behavior/Discharge Planning: Goal: Ability to manage health-related needs will improve Outcome: Progressing   Problem: Clinical Measurements: Goal: Ability to maintain clinical measurements within normal limits will improve Outcome: Progressing Goal: Will remain free from infection Outcome: Progressing Goal: Diagnostic test results will improve Outcome: Progressing Goal: Cardiovascular complication will be avoided Outcome: Progressing   Problem: Activity: Goal: Risk for activity intolerance will decrease Outcome: Progressing   Problem: Nutrition: Goal: Adequate nutrition will be maintained Outcome: Progressing   Problem: Pain Managment: Goal: General experience of comfort will improve Outcome: Progressing

## 2022-06-23 DIAGNOSIS — S72002D Fracture of unspecified part of neck of left femur, subsequent encounter for closed fracture with routine healing: Secondary | ICD-10-CM | POA: Diagnosis not present

## 2022-06-23 NOTE — Progress Notes (Signed)
  Subjective: 4 Days Post-Op Procedure(s) (LRB): INTRAMEDULLARY (IM) NAIL INTERTROCHANTERIC (Left) Patient reports pain as mild.  Rest well.  Family staying with him. Patient is well, and has had no acute complaints or problems Plan is to go to SNF at this time, did not perform as well with PT the last few days of admission Negative for chest pain and shortness of breath Fever: no Gastrointestinal: Negative for nausea and vomiting  Objective: Vital signs in last 24 hours: Temp:  [97.5 F (36.4 C)-98.1 F (36.7 C)] 98.1 F (36.7 C) (03/16 0737) Pulse Rate:  [67-86] 67 (03/16 0737) Resp:  [15-16] 16 (03/16 0737) BP: (117-120)/(58-70) 120/70 (03/16 0737) SpO2:  [99 %-100 %] 99 % (03/16 0737)  Intake/Output from previous day:  Intake/Output Summary (Last 24 hours) at 06/23/2022 1004 Last data filed at 06/22/2022 1700 Gross per 24 hour  Intake 240 ml  Output 200 ml  Net 40 ml    Intake/Output this shift: No intake/output data recorded.  Labs: Recent Labs    06/21/22 0452 06/22/22 0630  HGB 8.0* 8.1*   Recent Labs    06/21/22 0452 06/22/22 0630  WBC 8.8 9.6  RBC 3.01* 3.02*  HCT 24.2* 25.0*  PLT 185 208   Recent Labs    06/21/22 0452 06/22/22 0630  NA 138 138  K 3.8 4.1  CL 107 104  CO2 28 26  BUN 15 17  CREATININE 0.85 0.82  GLUCOSE 91 95  CALCIUM 7.8* 8.1*   No results for input(s): "LABPT", "INR" in the last 72 hours.    EXAM General - Patient is Alert and Oriented Extremity - Neurovascular intact Sensation intact distally Compartments soft Dressing/Incision - clean dry with scant drainage proximally Motor Function - intact, moving foot and toes well on exam.  Ambulated 10 feet with physical therapy  Past Medical History:  Diagnosis Date   Blood clots in brain 2009   Stroke ([place])     Assessment/Plan: 4 Days Post-Op Procedure(s) (LRB): INTRAMEDULLARY (IM) NAIL INTERTROCHANTERIC (Left) Principal Problem:   Closed left hip fracture  (HCC) Active Problems:   History of recurrent CVA   Vascular dementia ([employer])  Estimated body mass index is 20.81 kg/m as calculated from the following:   Height as of this encounter: 5\' 10"  (1.778 m).   Weight as of this encounter: 65.8 kg. Advance diet Up with therapy  Vitals reviewed this AM. No signs of infection to the left femur today. Plan is for d/c to SNF pending insurance approval. Up with therapy today as much as tolerated.  Discharge planning: Follow-up at [facility] clinic orthopedics in 2 weeks for staple removal and x-rays of the left hip.  Dressing change as needed.  DVT Prophylaxis - Lovenox, Foot Pumps, and TED hose Weight-Bearing as tolerated to left leg  J. Cameron Proud, PA-C Orthopaedic Surgery 06/23/2022, 10:04 AM

## 2022-06-23 NOTE — Progress Notes (Signed)
  Progress Note   Patient: Christopher Murphy [id number] DOB: 09-07-1945 DOA: 06/18/2022     4 DOS: the patient was seen and examined on 06/23/2022     Subjective: Patient seen and examined.   He was in the emergency room.   Patient underwent ORIF on June 18, 2021 Denies nausea vomiting chest pain     Brief Narrative:  77 year old with history of stroke and right hemiparesis, moderate vascular dementia who lives at home with his family presented with mechanical fall, he lost his balance getting up from the couch and falling on his left hip.  In the emergency room hemodynamically stable.  Skeletal survey negative except left femoral neck fracture.  Patient is s/p ORIF     Assessment & Plan:   Close traumatic right femoral neck fracture: Patient underwent ORIF  Orthopedic surgeon on board we appreciate input Continue PT OT PT OT have recommended discharge to rehab Social work is working on it Continue as needed analgesia   Chronic medical issues including Vascular dementia, on donepezil and Namenda.  Continued.  High risk of postop delirium.  All time fall precautions.  Delirium precautions. History of stroke with right hemiparesis: On aspirin and Crestor.  Continued.     Postoperative anemia We will keep an eye on this and if it remains stable by tomorrow we will discharge patient No indication for acute blood transfusion at this time Monitor CBC closely   DVT prophylaxis: SCDs Start: 06/19/22 0008     Code Status: Full code Family Communication: Daughter-in-law at the bedside Disposition Plan: Status is: Inpatient Remains inpatient appropriate because: Inpatient surgery planned   Consultants:  Orthopedics   Procedures:  ORIF done on 06/19/2022   Antimicrobials:  None         Physical Exam: Patient seen and examined at bedside this morning Surgical site dressing appears clean and dry CVS heart sounds 1 and 2 present Abdomen appears nontender  nondistended    Data Reviewed: No labs available for review today  Family Communication: Discussed with patient's daughter-in-law present at bedside   Disposition: Status is: Inpatient    Planned Discharge Destination: Acute rehab    I spent total of 35 minutes discussing goals of care with patient as well as patient's family present at bedside  Physical Exam: Vitals:   06/22/22 0921 06/22/22 1544 06/23/22 0003 06/23/22 0737  BP: (!) 101/57 117/65 (!) 119/58 120/70  Pulse: 76 67 86 67  Resp: 16 16 15 16   Temp: 98.1 F (36.7 C) (!) 97.5 F (36.4 C) 98.1 F (36.7 C) 98.1 F (36.7 C)  TempSrc:      SpO2: 100% 100% 99% 99%  Weight:      Height:       Author: Verline Lema, MD 06/23/2022 11:46 AM  For on call review [email or web address].

## 2022-06-23 NOTE — Plan of Care (Signed)
  Problem: Clinical Measurements: Goal: Will remain free from infection Outcome: Progressing Goal: Diagnostic test results will improve Outcome: Progressing Goal: Cardiovascular complication will be avoided Outcome: Progressing   Problem: Activity: Goal: Risk for activity intolerance will decrease Outcome: Progressing   Problem: Nutrition: Goal: Adequate nutrition will be maintained Outcome: Progressing   Problem: Elimination: Goal: Will not experience complications related to bowel motility Outcome: Progressing Goal: Will not experience complications related to urinary retention Outcome: Progressing   Problem: Pain Managment: Goal: General experience of comfort will improve Outcome: Progressing   Problem: Skin Integrity: Goal: Risk for impaired skin integrity will decrease Outcome: Progressing

## 2022-06-23 NOTE — Progress Notes (Signed)
Physical Therapy Treatment Patient Details Name: Christopher Murphy MRN: [id number] DOB: 1945-05-13 Today's Date: 06/23/2022   History of Present Illness 77 year old with history of stroke and right hemiparesis, moderate vascular dementia who lives at home with his family presented 06/19/22 with mechanical fall, he lost his balance getting up from the couch and falling on his left hip.  In the emergency room hemodynamically stable.  Skeletal survey negative except left femoral neck fracture.  S/P L femur ORIF. WBAT.    PT Comments    Good functional progression this date. Focused on gait training with daughter present to translate and assist with staying on task. Pt continues to experience L hip pain with weight bearing despite being pre-medicated, he is easily distracted needing multimodal and manual cues to stay on task which is attainable. Pt required MaxA to stand from low recliner to RW. Gait training completed in open hallway with non-grip sock on Right foot to allow for easier advancement. Pt completed 71ft x 1 and 20ft x 2 with RW, MinA and seated rest breaks. Occasional assist to advance R LE forward and prevent ER at hip as he fatigued. No knee buckling noted. Overall great progression with mobility this session. Continue PT per POC.   Recommendations for follow up therapy are one component of a multi-disciplinary discharge planning process, led by the attending physician.  Recommendations may be updated based on patient status, additional functional criteria and insurance authorization.  Follow Up Recommendations  Skilled nursing-short term rehab (<3 hours/day)     Assistance Recommended at Discharge Frequent or constant Supervision/Assistance  Patient can return home with the following A lot of help with walking and/or transfers;A lot of help with bathing/dressing/bathroom;Assistance with cooking/housework;Assistance with feeding;Direct supervision/assist for medications  management;Direct supervision/assist for financial management;Assist for transportation;Help with stairs or ramp for entrance   Equipment Recommendations  Other (comment) (Defer to next level of care)    Recommendations for Other Services       Precautions / Restrictions Precautions Precautions: Fall Restrictions Weight Bearing Restrictions: Yes LLE Weight Bearing: Weight bearing as tolerated     Mobility  Bed Mobility               General bed mobility comments:  (In recliner pre/post)    Transfers Overall transfer level: Needs assistance Equipment used: Rolling walker (2 wheels) Transfers: Sit to/from Stand Sit to Stand: Max assist           General transfer comment:  (MaxA to stand from recliner to [place])    Ambulation/Gait Ambulation/Gait assistance: Min assist (Assist from family to translate gait sequencing) Gait Distance (Feet): 20 Feet Assistive device: Rolling walker (2 wheels) Gait Pattern/deviations: Step-to pattern, Decreased stance time - left, Decreased step length - right, Decreased step length - left Gait velocity: decreased     General Gait Details:  (Assist to advance RW due to distraction level, ocassional assist to bring R LE forward.  Grey sock placed inside out on Righ foot to facilitate advancement)   Stairs             Wheelchair Mobility    Modified Rankin (Stroke Patients Only)       Balance Overall balance assessment: Needs assistance Sitting-balance support: Feet supported, Bilateral upper extremity supported Sitting balance-Leahy Scale: Fair     Standing balance support: Bilateral upper extremity supported, During functional activity, Reliant on assistive device for balance Standing balance-Leahy Scale: Fair Standing balance comment: reliant on RW for all standing activity. Pt is  high fall risk                            Cognition Arousal/Alertness: Awake/alert Behavior During Therapy: WFL for tasks  assessed/performed Overall Cognitive Status: History of cognitive impairments - at baseline                                 General Comments: Pt has cognition deficits at baseline. Supportive family member able to assist with langage barriers        Exercises Total Joint Exercises Ankle Circles/Pumps: AROM, Both, 10 reps General Exercises - Lower Extremity Long Arc Quad: AROM, Right, AAROM, Left, 10 reps    General Comments General comments (skin integrity, edema, etc.): Heavy vc's for desired tasks, pt is easily distracted and requires continuous cuing.      Pertinent Vitals/Pain Pain Assessment Pain Assessment: Faces Faces Pain Scale: Hurts little more Pain Location:  (L Hip) Pain Descriptors / Indicators: Discomfort, Grimacing, Guarding, Moaning Pain Intervention(s): Limited activity within patient's tolerance, Ice applied, Premedicated before session    Home Living                          Prior Function            PT Goals (current goals can now be found in the care plan section) Acute Rehab PT Goals Patient Stated Goal: none stated Progress towards PT goals: Progressing toward goals    Frequency    7X/week      PT Plan Frequency needs to be updated    Co-evaluation              AM-PAC PT "6 Clicks" Mobility   Outcome Measure  Help needed turning from your back to your side while in a flat bed without using bedrails?: A Lot Help needed moving from lying on your back to sitting on the side of a flat bed without using bedrails?: A Lot Help needed moving to and from a bed to a chair (including a wheelchair)?: A Lot Help needed standing up from a chair using your arms (e.g., wheelchair or bedside chair)?: A Lot Help needed to walk in hospital room?: A Lot Help needed climbing 3-5 steps with a railing? : Total 6 Click Score: 11    End of Session Equipment Utilized During Treatment: Gait belt Activity Tolerance: Patient  tolerated treatment well Patient left: in chair;with call bell/phone within reach;with chair alarm set;with family/visitor present Nurse Communication: Mobility status PT Visit Diagnosis: Difficulty in walking, not elsewhere classified (R26.2);Muscle weakness (generalized) (M62.81);History of falling (Z91.81);Pain Pain - Right/Left: Left Pain - part of body: Leg     Time: [id number] PT Time Calculation (min) (ACUTE ONLY): 46 min  Charges:  $Gait Training: 8-22 mins $Therapeutic Exercise: 8-22 mins $Therapeutic Activity: 8-22 mins                    Mikel Cella, PTA   Christopher Murphy 06/23/2022, 2:04 PM

## 2022-06-24 DIAGNOSIS — S72002D Fracture of unspecified part of neck of left femur, subsequent encounter for closed fracture with routine healing: Secondary | ICD-10-CM | POA: Diagnosis not present

## 2022-06-24 NOTE — TOC Progression Note (Signed)
Transition of Care [facility]) - Progression Note    Patient Details  Name: Christopher Murphy MRN: [id number] Date of Birth: 10-Jan-1946  Transition of Care [facility]) CM/SW [place], RN Phone Number: 06/24/2022, 1:29 PM  Clinical Narrative:    I have met with patient's daughter in law at beside today.  PT was also present in the room.  Patient's family is now considering STR for [name].  Family would like to see if patient shows any improvement to determine if they can care for patient at home.  Patient's daughter-in-law [name] reports that they would like to consider facilities in the [place] area.    I have completed Passr, Fl2 and a bed search  for [place] facilities.    Passr is flagged at this time.  HP, Fl2, and progress notes sent in to [facility] MUST.  Will continue to follow for Passr number.    TOC will continue to follow for discharge planning.      Expected Discharge Plan: [place] Barriers to Discharge: No Barriers Identified  Expected Discharge Plan and Services   Discharge Planning Services: CM Consult Post Acute Care Choice: [place] arrangements for the past 2 months: Single Family Home Expected Discharge Date: 06/22/22                         HH Arranged: PT, OT HH Agency: [place] Date [facility] Agency Contacted: 06/20/22 Time [place]: [id number] Representative spoke with at [place]: [place] Determinants of Health ([place]) Interventions SDOH Screenings   Food Insecurity: No Food Insecurity (06/19/2022)  Housing: Low Risk  (06/19/2022)  Transportation Needs: No Transportation Needs (06/19/2022)  Utilities: Not At Risk (06/19/2022)  Depression (PHQ2-9): Low Risk  (05/23/2022)  Tobacco Use: Low Risk  (06/20/2022)    Readmission Risk Interventions     No data to display

## 2022-06-24 NOTE — NC FL2 (Signed)
[place] LEVEL OF CARE FORM     IDENTIFICATION  Patient Name: Christopher Murphy Birthdate: 30-Jun-1945 Sex: male Admission Date (Current Location): 06/18/2022  [facility] [place] and [place] Number:  Engineering geologist and Address:  [facility], [address], [place], [place] [address]      Provider Number: [id number]  Attending Physician Name and Address:  Verline Lema, MD  Relative Name and Phone Number:  [name] Candiss Norse [phone]) and Theresia Lo [phone])    Current Level of Care: Hospital Recommended Level of Care: [place] Prior Approval Number:    Date Approved/Denied:   PASRR Number: Pending  Discharge Plan: SNF    Current Diagnoses: Patient Active Problem List   Diagnosis Date Noted   Closed left hip fracture ([place]) 06/19/2022   CVA (cerebrovascular accident) ([place]) 03/11/2022   Hemiparesis affecting right side as late effect of cerebrovascular accident (CVA) ([place]) 03/10/2022   GERD (gastroesophageal reflux disease) 07/19/2021   Iron deficiency anemia secondary to inadequate dietary iron intake 01/23/2021   Aortic atherosclerosis ([place]) 01/23/2021   Thoracic aortic aneurysm ([place]) 06/02/2020   Vascular dementia ([place]) 10/28/2019   Chronic cough 10/19/2019   History of recurrent CVA 06/12/2016   TIA (transient ischemic attack) 04/05/2016    Orientation RESPIRATION BLADDER Height & Weight     Self  Normal Incontinent Weight: 65.8 kg Height:  5\' 10"  (177.8 cm)  BEHAVIORAL SYMPTOMS/MOOD NEUROLOGICAL BOWEL NUTRITION STATUS      Incontinent  (See discharge summary)  AMBULATORY STATUS COMMUNICATION OF NEEDS Skin   Extensive Assist Verbally (Commuicates in native language) Normal                       Personal Care Assistance Level of Assistance  Bathing, Feeding, Dressing Bathing Assistance: Maximum assistance Feeding assistance: Maximum assistance Dressing Assistance: Maximum  assistance     Functional Limitations Info  Speech (Patient speaks Punjabi language)     Speech Info: Adequate (Speaks in native languate Punjabi)    SPECIAL CARE FACTORS FREQUENCY  PT (By licensed PT), OT (By licensed OT)     PT Frequency: 5x weekly OT Frequency: 5x weekly            Contractures Contractures Info: Not present    Additional Factors Info  Code Status, Allergies Code Status Info: Full Allergies Info: No Known Allergies           Current Medications (06/24/2022):  This is the current hospital active medication list Current Facility-Administered Medications  Medication Dose Route Frequency Provider Last Rate Last Admin   0.9 %  sodium chloride infusion   Intravenous Continuous Leim Fabry, MD   Stopped at 06/20/22 1000   acetaminophen (TYLENOL) tablet 1,000 mg  1,000 mg Oral Q8H Leim Fabry, MD   1,000 mg at 06/24/22 [id number]   aspirin EC tablet 81 mg  81 mg Oral Daily Leim Fabry, MD   81 mg at 06/24/22 1001   bisacodyl (DULCOLAX) suppository 10 mg  10 mg Rectal Daily PRN Leim Fabry, MD   10 mg at 06/21/22 1707   docusate sodium (COLACE) capsule 100 mg  100 mg Oral BID Leim Fabry, MD   100 mg at 06/24/22 1002   donepezil (ARICEPT) tablet 10 mg  10 mg Oral QHS Leim Fabry, MD   10 mg at 06/23/22 2117   enoxaparin (LOVENOX) injection 40 mg  40 mg Subcutaneous Q24H Leim Fabry, MD   40 mg at  06/24/22 1001   feeding supplement (ENSURE ENLIVE / ENSURE PLUS) liquid 237 mL  237 mL Oral BID BM Barb Merino, MD   237 mL at 06/24/22 1002   HYDROmorphone (DILAUDID) injection 0.2-0.4 mg  0.2-0.4 mg Intravenous Q4H PRN Leim Fabry, MD       memantine [facility]) tablet 10 mg  10 mg Oral BID Leim Fabry, MD   10 mg at 06/24/22 1002   methocarbamol (ROBAXIN) tablet 500 mg  500 mg Oral Q6H PRN Leim Fabry, MD   500 mg at 06/20/22 1856   Or   methocarbamol (ROBAXIN) 500 mg in dextrose 5 % 50 mL IVPB  500 mg Intravenous Q6H PRN Leim Fabry, MD       metoCLOPramide  (REGLAN) tablet 5-10 mg  5-10 mg Oral Q8H PRN Leim Fabry, MD       Or   metoCLOPramide (REGLAN) injection 5-10 mg  5-10 mg Intravenous Q8H PRN Leim Fabry, MD       multivitamin with minerals tablet 1 tablet  1 tablet Oral Daily Barb Merino, MD   1 tablet at 06/24/22 1008   ondansetron (ZOFRAN) tablet 4 mg  4 mg Oral Q6H PRN Leim Fabry, MD       Or   ondansetron [facility]) injection 4 mg  4 mg Intravenous Q6H PRN Leim Fabry, MD       oxyCODONE (Oxy IR/ROXICODONE) immediate release tablet 2.5-5 mg  2.5-5 mg Oral Q4H PRN Leim Fabry, MD       oxyCODONE (Oxy IR/ROXICODONE) immediate release tablet 5-10 mg  5-10 mg Oral Q4H PRN Leim Fabry, MD       pantoprazole (PROTONIX) EC tablet 40 mg  40 mg Oral Daily Leim Fabry, MD   40 mg at 06/24/22 1002   rosuvastatin (CRESTOR) tablet 20 mg  20 mg Oral Daily Leim Fabry, MD   20 mg at 06/23/22 2118   senna-docusate (Senokot-S) tablet 1 tablet  1 tablet Oral QHS PRN Leim Fabry, MD   1 tablet at 06/20/22 2133   sodium phosphate (FLEET) 7-19 GM/118ML enema 1 enema  1 enema Rectal Once PRN Leim Fabry, MD       traMADol Veatrice [facility]) tablet 50 mg  50 mg Oral Q6H PRN Leim Fabry, MD   50 mg at 06/23/22 [id number]     Discharge Medications: Please see discharge summary for a list of discharge medications.  Relevant Imaging Results:  Relevant Lab Results:   Additional Information SS- [id number]  Carol Ada, RN

## 2022-06-24 NOTE — Progress Notes (Addendum)
Physical Therapy Treatment Patient Details Name: Christopher Murphy MRN: [id number] DOB: [birth year] Today's Date: 06/24/2022   History of Present Illness 77 year old with history of stroke and right hemiparesis, moderate vascular dementia who lives at home with his family presented 06/19/22 with mechanical fall, he lost his balance getting up from the couch and falling on his left hip.  In the emergency room hemodynamically stable.  Skeletal survey negative except left femoral neck fracture.  S/P L femur ORIF. WBAT.    PT Comments    Pt was seen for two separate prolonged sessions today to try to advance safe functional mobility. Pt remains very limited by cognition/ prior medical history/ and pain. Pt's supportive son and daughter-in-law present both sessions. PT requires extensive assistance for all mobility, transfers, and gait. Discussed at length, DC disposition. All parties agree that [place] home with [facility] services + extensive equipment will be best for patient and family.Author does not feel he will benefit from a SNF due to problems listed above. He will benefit from 24/7 assistance + [place] services to decrease caregiver burden. Recommended equipment are listed below. Acute PT will continue to follow per current POC.  May also benefit from EMS transport home to decrease caregiver burden.    Recommendations for follow up therapy are one component of a multi-disciplinary discharge planning process, led by the attending physician.  Recommendations may be updated based on patient status, additional functional criteria and insurance authorization.  Follow Up Recommendations  Follow physician's recommendations for discharge plan and follow up therapies (Pt/Family planning to return home with 24/7 assistance/care support. Author recomemnds all [place] services available to decrease caregiver burden)     Assistance Recommended at Discharge Frequent or constant Supervision/Assistance  Patient can return  home with the following A lot of help with walking and/or transfers;A lot of help with bathing/dressing/bathroom;Assistance with cooking/housework;Assistance with feeding;Direct supervision/assist for medications management;Direct supervision/assist for financial management;Assist for transportation;Help with stairs or ramp for entrance   Equipment Recommendations  BSC/3in1;Rolling walker (2 wheels);Wheelchair (measurements PT);Wheelchair cushion (measurements PT);Hospital bed;Other (comment) (hoyer lift)       Precautions / Restrictions Precautions Precautions: Fall Restrictions Weight Bearing Restrictions: Yes LLE Weight Bearing: Weight bearing as tolerated     Mobility  Bed Mobility Overal bed mobility: Needs Assistance Bed Mobility: Supine to Sit  Supine to sit: Max assist  General bed mobility comments: Max assist progressing BLEs    Transfers Overall transfer level: Needs assistance Equipment used: Rolling walker (2 wheels) Transfers: Sit to/from Stand Sit to Stand: Min assist, Max assist   General transfer comment: pt struggles with initiation of movements. When fully participating min assist. When pt struggles with motor planning coordination, requires max assist.    Ambulation/Gait Ambulation/Gait assistance: Max assist Gait Distance (Feet): 5 Feet Assistive device: Rolling walker (2 wheels) Gait Pattern/deviations: Step-to pattern, Decreased stance time - left, Decreased step length - right, Decreased step length - left Gait velocity: decreased     General Gait Details: pt requires extensive assistance to take steps due to cognition/ poor motor coordination/ and L hip pain. required author to physically advance RLE for him to actually ambulate. 2 x 5 ft with max assist to progress RLE. discussed recommendation for stand pivot transfers until pt can more safely/easily ambulate.    Balance Overall balance assessment: Needs assistance Sitting-balance support: Feet  supported, Bilateral upper extremity supported Sitting balance-Leahy Scale: Fair     Standing balance support: Bilateral upper extremity supported, During functional activity, Reliant on  assistive device for balance Standing balance-Leahy Scale: Fair Standing balance comment: reliant on RW for all standing activity. Pt is high fall risk       Cognition Arousal/Alertness: Awake/alert Behavior During Therapy: WFL for tasks assessed/performed Overall Cognitive Status: History of cognitive impairments - at baseline      General Comments: Pt has cognition deficits at baseline. Supportive family member able to assist with langage barriers           General Comments General comments (skin integrity, edema, etc.): Discussed with son+daughter in-law DC disposition. All parties agree that pt is not a great SNF candidiate and will be better off retruning home with 24/7 assistance + [facility] services. discussed all equipment recommendations to maximize success once [place] home. relayed to MD/TOC that family is planning to return home tomorrow.      Pertinent Vitals/Pain Pain Assessment Pain Assessment: PAINAD Breathing: occasional labored breathing, short period of hyperventilation Negative Vocalization: occasional moan/groan, low speech, negative/disapproving quality Facial Expression: sad, frightened, frown Body Language: relaxed Consolability: distracted or reassured by voice/touch PAINAD Score: 4 Pain Location: L hip Pain Descriptors / Indicators: Discomfort, Grimacing, Guarding, Moaning Pain Intervention(s): Limited activity within patient's tolerance, Monitored during session, Premedicated before session, Repositioned     PT Goals (current goals can now be found in the care plan section) Acute Rehab PT Goals Patient Stated Goal: none stated Progress towards PT goals: Not progressing toward goals - comment    Frequency    Min 2X/week      PT Plan Discharge plan needs to be  updated;Frequency needs to be updated       AM-PAC PT "6 Clicks" Mobility   Outcome Measure  Help needed turning from your back to your side while in a flat bed without using bedrails?: A Lot Help needed moving from lying on your back to sitting on the side of a flat bed without using bedrails?: A Lot Help needed moving to and from a bed to a chair (including a wheelchair)?: A Lot Help needed standing up from a chair using your arms (e.g., wheelchair or bedside chair)?: A Lot Help needed to walk in hospital room?: A Lot Help needed climbing 3-5 steps with a railing? : Total 6 Click Score: 11    End of Session Equipment Utilized During Treatment: Gait belt Activity Tolerance: Other (comment) (limited by cognition + langage barrier+ previous hx of CVA/TIAs+ pain.) Patient left: in bed;with call bell/phone within reach;with family/visitor present Nurse Communication: Mobility status PT Visit Diagnosis: Difficulty in walking, not elsewhere classified (R26.2);Muscle weakness (generalized) (M62.81);History of falling (Z91.81);Pain Pain - Right/Left: Left Pain - part of body: Leg     Time: [id number] PT Time Calculation (min) (ACUTE ONLY): 68 min  Charges:  $Gait Training: 23-37 mins $Therapeutic Exercise: 8-22 mins $Therapeutic Activity: 23-37 mins                     Julaine Fusi PTA 06/24/22, 3:04 PM

## 2022-06-24 NOTE — Plan of Care (Signed)
  Problem: Education: Goal: Knowledge of General Education information will improve Description: Including pain rating scale, medication(s)/side effects and non-pharmacologic comfort measures Outcome: Progressing   Problem: Health Behavior/Discharge Planning: Goal: Ability to manage health-related needs will improve Outcome: Progressing   Problem: Clinical Measurements: Goal: Ability to maintain clinical measurements within normal limits will improve Outcome: Progressing Goal: Will remain free from infection Outcome: Progressing Goal: Diagnostic test results will improve Outcome: Progressing Goal: Respiratory complications will improve Outcome: Progressing Goal: Cardiovascular complication will be avoided Outcome: Progressing   Problem: Activity: Goal: Risk for activity intolerance will decrease Outcome: Progressing   Problem: Nutrition: Goal: Adequate nutrition will be maintained Outcome: Progressing   Problem: Coping: Goal: Level of anxiety will decrease Outcome: Progressing   Problem: Elimination: Goal: Will not experience complications related to bowel motility Outcome: Progressing Goal: Will not experience complications related to urinary retention Outcome: Progressing   Problem: Pain Managment: Goal: General experience of comfort will improve Outcome: Progressing   Problem: Safety: Goal: Ability to remain free from injury will improve Outcome: Progressing   Problem: Skin Integrity: Goal: Risk for impaired skin integrity will decrease Outcome: Progressing   Problem: Education: Goal: Knowledge of General Education information will improve Description: Including pain rating scale, medication(s)/side effects and non-pharmacologic comfort measures Outcome: Progressing   Problem: Education: Goal: Verbalization of understanding the information provided (i.e., activity precautions, restrictions, etc) will improve Outcome: Progressing   Problem:  Activity: Goal: Ability to ambulate and perform ADLs will improve Outcome: Progressing   Problem: Self-Concept: Goal: Ability to maintain and perform role responsibilities to the fullest extent possible will improve Outcome: Progressing   Problem: Pain Management: Goal: Pain level will decrease Outcome: Progressing

## 2022-06-24 NOTE — Progress Notes (Signed)
  Subjective: 5 Days Post-Op Procedure(s) (LRB): INTRAMEDULLARY (IM) NAIL INTERTROCHANTERIC (Left) Patient reports pain as mild.  Resting well.  Family staying with him. Patient is well, and has had no acute complaints or problems Plan is to go to SNF at this time, did do better with PT yesterday. Negative for chest pain and shortness of breath Fever: no Gastrointestinal: Negative for nausea and vomiting  Objective: Vital signs in last 24 hours: Temp:  [98.2 F (36.8 C)-98.4 F (36.9 C)] 98.4 F (36.9 C) (03/17 0753) Pulse Rate:  [68-87] 82 (03/17 0753) Resp:  [16-18] 16 (03/17 0753) BP: (113-130)/(58-73) 125/73 (03/17 0753) SpO2:  [98 %-100 %] 98 % (03/17 0753)  Intake/Output from previous day:  Intake/Output Summary (Last 24 hours) at 06/24/2022 0925 Last data filed at 06/24/2022 0450 Gross per 24 hour  Intake 360 ml  Output 1200 ml  Net -840 ml    Intake/Output this shift: No intake/output data recorded.  Labs: Recent Labs    06/22/22 0630  HGB 8.1*   Recent Labs    06/22/22 0630  WBC 9.6  RBC 3.02*  HCT 25.0*  PLT 208   Recent Labs    06/22/22 0630  NA 138  K 4.1  CL 104  CO2 26  BUN 17  CREATININE 0.82  GLUCOSE 95  CALCIUM 8.1*   No results for input(s): "LABPT", "INR" in the last 72 hours.    EXAM General - Patient is Alert and Oriented Extremity - Neurovascular intact Sensation intact distally Compartments soft Dressing/Incision - clean dry with scant drainage proximally Motor Function - intact, moving foot and toes well on exam.  Ambulated 10 feet with physical therapy  Past Medical History:  Diagnosis Date   Blood clots in brain 2009   Stroke ([place])     Assessment/Plan: 5 Days Post-Op Procedure(s) (LRB): INTRAMEDULLARY (IM) NAIL INTERTROCHANTERIC (Left) Principal Problem:   Closed left hip fracture (HCC) Active Problems:   History of recurrent CVA   Vascular dementia ([place])  Estimated body mass index is 20.81 kg/m as  calculated from the following:   Height as of this encounter: 5\' 10"  (1.778 m).   Weight as of this encounter: 65.8 kg. Advance diet Up with therapy  Vitals reviewed this AM. No signs of infection to the left femur today. Plan is for d/c to SNF pending insurance approval. Up with therapy today as much as tolerated.  Discharge planning: Follow-up at [facility] clinic orthopedics in 2 weeks for staple removal and x-rays of the left hip.  Dressing change as needed.  DVT Prophylaxis - Lovenox, Foot Pumps, and TED hose Weight-Bearing as tolerated to left leg  J. Cameron Proud, PA-C Orthopaedic Surgery 06/24/2022, 9:25 AM

## 2022-06-24 NOTE — Progress Notes (Signed)
  Progress Note   Patient: Christopher Murphy [id number] DOB: 1945/05/22 DOA: 06/18/2022     5 DOS: the patient was seen and examined on 06/24/2022   Subjective: Patient seen and examined.   Currently medically ready awaiting rehab placement Patient underwent ORIF on June 18, 2021 Denies nausea vomiting chest pain     Brief Narrative:  77 year old with history of stroke and right hemiparesis, moderate vascular dementia who lives at home with his family presented with mechanical fall, he lost his balance getting up from the couch and falling on his left hip.  In the emergency room hemodynamically stable.  Skeletal survey negative except left femoral neck fracture.  Patient is s/p ORIF     Assessment & Plan:   Close traumatic right femoral neck fracture: Patient underwent ORIF  Orthopedic surgeon on board we appreciate input Continue PT OT PT OT have recommended discharge to rehab Social work is working on it Continue as needed analgesia   Chronic medical issues including Vascular dementia, on donepezil and Namenda.  Continued.  High risk of postop delirium.  All time fall precautions.  Delirium precautions. History of stroke with right hemiparesis: On aspirin and Crestor.  Continued.     Postoperative anemia No indication for acute blood transfusion at this time Monitor CBC closely   DVT prophylaxis: SCDs Start: 06/19/22 0008     Code Status: Full code Family Communication: Daughter-in-law at the bedside Disposition Plan: Status is: Inpatient Remains inpatient as patient is not a safe discharge home and awaiting rehab placement   Consultants:  Orthopedics   Procedures:  ORIF done on 06/19/2022   Antimicrobials:  None      Physical Exam: Patient seen and examined at bedside this morning Surgical site dressing appears clean and dry CVS heart sounds 1 and 2 present Abdomen appears nontender nondistended     Data Reviewed: No labs available for review  today   Family Communication: Discussed with patient's daughter-in-law present at bedside   Disposition: Status is: Inpatient    Planned Discharge Destination: Acute rehab   Spent 30 minutes taking care of this patient today    Vitals:   06/23/22 0737 06/23/22 1514 06/23/22 2322 06/24/22 0753  BP: 120/70 113/64 (!) 130/58 125/73  Pulse: 67 68 87 82  Resp: 16 18 18 16   Temp: 98.1 F (36.7 C) 98.2 F (36.8 C) 98.4 F (36.9 C) 98.4 F (36.9 C)  TempSrc:      SpO2: 99% 100% 99% 98%  Weight:      Height:        Author: Verline Lema, MD 06/24/2022 12:08 PM  For on call review [email or web address].

## 2022-06-24 NOTE — TOC Progression Note (Signed)
Transition of Care [facility]) - Progression Note    Patient Details  Name: Christopher Murphy MRN: [id number] Date of Birth: February 09, 1946  Transition of Care [facility]) CM/SW [place], RN Phone Number: 06/24/2022, 4:01 PM  Clinical Narrative:   PT informed care team that family has decided to take patient home.  PT team reports that patient will need a [employer] fast streamer, Wheelchair with cushion, and Hospital bed.    I have spoken Jermaine with [employer] and informed him that patient would be going home on tomorrow.  I have informed him that patient would need [employer], and Wheelchair with cushion.  [employer] was previously  informed by [facility] staff that patient would need [place] [facility] bed.  I have made Jermanine with [employer] aware that patient would be discharging home on tomorrow.    I have informed Tommi Rumps with [facility] that patient would be a tentative discharge for tomorrow.  I have informed Tommi Rumps that patient will need Home Health PT and In home aid.    TOC will continue to follow for discharge planning.         Plan and Services   Discharge Planning Services: CM Consult Post Acute Care Choice: Home Health Living arrangements for the past 2 months: Single Family Home Expected Discharge Date: 06/22/22                         HH Arranged: PT, OT HH Agency: [facility] Date [place]: 06/20/22 Time [place]: [id number] Representative spoke with at [place]: [place] Determinants of Health ([place]) Interventions SDOH Screenings   Food Insecurity: No Food Insecurity (06/19/2022)  Housing: Low Risk  (06/19/2022)  Transportation Needs: No Transportation Needs (06/19/2022)  Utilities: Not At Risk (06/19/2022)  Depression (PHQ2-9): Low Risk  (05/23/2022)  Tobacco Use: Low Risk  (06/20/2022)    Readmission Risk Interventions     No data to display

## 2022-06-25 DIAGNOSIS — S72002D Fracture of unspecified part of neck of left femur, subsequent encounter for closed fracture with routine healing: Secondary | ICD-10-CM | POA: Diagnosis not present

## 2022-06-25 NOTE — Progress Notes (Signed)
Physical Therapy Treatment Patient Details Name: Christopher Murphy MRN: [id number] DOB: [birth year] Today's Date: 06/25/2022   History of Present Illness 77 year old with history of stroke and right hemiparesis, moderate vascular dementia who lives at home with his family presented 06/19/22 with mechanical fall, he lost his balance getting up from the couch and falling on his left hip.  In the emergency room hemodynamically stable.  Skeletal survey negative except left femoral neck fracture.  S/P L femur ORIF. WBAT.    PT Comments    Pt was pleasant and motivated to participate during the session and put forth good effort throughout.  Pt's family present to assist with interpretation.  Pt required physical assistance with functional tasks and heavy multi-modal cues for sequencing but made notable progress with gait this session.  Pt ambulated with cuing for step-to sequencing for LLE pain control and was able to amb a max of around 15 feet with only occasional min A to guide the RW and on 2-3 occasions to help advance the RLE.  See previous PT note regarding extensive notes related to discharge plan for home with 24/7 family assistance with follow up services.      Recommendations for follow up therapy are one component of a multi-disciplinary discharge planning process, led by the attending physician.  Recommendations may be updated based on patient status, additional functional criteria and insurance authorization.  Follow Up Recommendations  Follow physician's recommendations for discharge plan and follow up therapies     Assistance Recommended at Discharge Frequent or constant Supervision/Assistance  Patient can return home with the following A lot of help with walking and/or transfers;A lot of help with bathing/dressing/bathroom;Assistance with cooking/housework;Assistance with feeding;Direct supervision/assist for medications management;Direct supervision/assist for financial  management;Assist for transportation;Help with stairs or ramp for entrance   Equipment Recommendations  BSC/3in1;Rolling walker (2 wheels);Wheelchair (measurements PT);Wheelchair cushion (measurements PT);Hospital bed;Other (comment)    Recommendations for Other Services       Precautions / Restrictions Precautions Precautions: Fall Restrictions Weight Bearing Restrictions: Yes LLE Weight Bearing: Weight bearing as tolerated     Mobility  Bed Mobility Overal bed mobility: Needs Assistance Bed Mobility: Supine to Sit     Supine to sit: +2 for physical assistance, Max assist     General bed mobility comments: +2 max A for BLE and trunk control    Transfers Overall transfer level: Needs assistance Equipment used: Rolling walker (2 wheels) Transfers: Sit to/from Stand Sit to Stand: Mod assist, +2 physical assistance           General transfer comment: Max verbal and tactile cues for sequencing    Ambulation/Gait Ambulation/Gait assistance: Min assist Gait Distance (Feet): 3 Feet x 1, 15 Feet x 1 Assistive device: Rolling walker (2 wheels) Gait Pattern/deviations: Step-to pattern, Decreased stance time - left, Decreased step length - right, Antalgic Gait velocity: decreased     General Gait Details: Max multi-modal cues for step-to sequencing with UE support to assist with pain control during LLE stance phase; min A to guide the RW and rare min A to advance the RLE   Stairs             Wheelchair Mobility    Modified Rankin (Stroke Patients Only)       Balance Overall balance assessment: Needs assistance Sitting-balance support: Feet supported, Bilateral upper extremity supported Sitting balance-Leahy Scale: Fair     Standing balance support: Bilateral upper extremity supported, During functional activity, Reliant on assistive device for balance Standing  balance-Leahy Scale: Fair                              Cognition  Arousal/Alertness: Awake/alert Behavior During Therapy: WFL for tasks assessed/performed Overall Cognitive Status: History of cognitive impairments - at baseline                                          Exercises      General Comments        Pertinent Vitals/Pain Pain Assessment Pain Assessment: PAINAD Breathing: normal Negative Vocalization: occasional moan/groan, low speech, negative/disapproving quality Facial Expression: smiling or inexpressive Body Language: relaxed Consolability: distracted or reassured by voice/touch PAINAD Score: 2 Pain Location: L hip Pain Descriptors / Indicators: Discomfort, Grimacing, Guarding, Moaning Pain Intervention(s): Repositioned, Premedicated before session, Monitored during session    Home Living                          Prior Function            PT Goals (current goals can now be found in the care plan section) Progress towards PT goals: Progressing toward goals    Frequency    7X/week      PT Plan Current plan remains appropriate;Frequency needs to be updated    Co-evaluation              AM-PAC PT "6 Clicks" Mobility   Outcome Measure  Help needed turning from your back to your side while in a flat bed without using bedrails?: A Lot Help needed moving from lying on your back to sitting on the side of a flat bed without using bedrails?: Total Help needed moving to and from a bed to a chair (including a wheelchair)?: A Lot Help needed standing up from a chair using your arms (e.g., wheelchair or bedside chair)?: A Lot Help needed to walk in hospital room?: A Lot Help needed climbing 3-5 steps with a railing? : Total 6 Click Score: 10    End of Session Equipment Utilized During Treatment: Gait belt Activity Tolerance: Patient tolerated treatment well Patient left: in chair;with call bell/phone within reach;with chair alarm set;with SCD's reapplied;with family/visitor present Nurse  Communication: Mobility status PT Visit Diagnosis: Difficulty in walking, not elsewhere classified (R26.2);Muscle weakness (generalized) (M62.81);History of falling (Z91.81);Pain Pain - Right/Left: Left Pain - part of body: Leg     Time: [id number] PT Time Calculation (min) (ACUTE ONLY): 28 min  Charges:  $Gait Training: 8-22 mins $Therapeutic Activity: 8-22 mins                    D. Scott Hyde Sires PT, DPT 06/25/22, 3:02 PM

## 2022-06-25 NOTE — Progress Notes (Signed)
  Progress Note   Patient: Christopher Murphy [id number] DOB: [birth year] DOA: 06/18/2022     6 DOS: the patient was seen and examined on 06/25/2022    Subjective: Patient seen and examined.   Patient's family have made decision to take patient home to have home health services Home equipment including hospital bed and being made available today for patient to be discharged today Patient underwent ORIF on June 18, 2021 Denies nausea vomiting chest pain     Brief Narrative:  77 year old with history of stroke and right hemiparesis, moderate vascular dementia who lives at home with his family presented with mechanical fall, he lost his balance getting up from the couch and falling on his left hip.  In the emergency room hemodynamically stable.  Skeletal survey negative except left femoral neck fracture.  Patient is s/p ORIF     Assessment & Plan:   Close traumatic right femoral neck fracture: Patient underwent ORIF  Orthopedic surgeon on board we appreciate input Continue PT OT Patient will be discharged home today to follow-up with OT and PT Social work is working on it Continue as needed analgesia   Chronic medical issues including Vascular dementia, on donepezil and Namenda.  Continued.  High risk of postop delirium.  All time fall precautions.  Delirium precautions. History of stroke with right hemiparesis: On aspirin and Crestor.  Continued.     Postoperative anemia No indication for acute blood transfusion at this time Monitor CBC closely   DVT prophylaxis: SCDs Start: 06/19/22 0008     Code Status: Full code Family Communication: Son at the bedside Disposition Plan: Status is: Inpatient Family have made decision for patient to be discharged home today with home health services in place  Consultants:  Orthopedics   Procedures:  ORIF done on 06/19/2022   Antimicrobials:  None       Physical Exam: Patient seen and examined at bedside this  morning Surgical site dressing appears clean and dry CVS heart sounds 1 and 2 present Abdomen appears nontender nondistended     Data Reviewed: No labs available for review today    Disposition: Status is: Inpatient    Planned Discharge Destination: Home with home health   Spent 35 minutes taking care of this patient today as well as discussing goals of care with patient's son present at bedside as well as case management.     Vitals:   06/24/22 0753 06/24/22 1542 06/25/22 0045 06/25/22 0747  BP: 125/73 120/62 (!) 111/57 118/60  Pulse: 82 86 81 79  Resp: 16 17 18 16   Temp: 98.4 F (36.9 C) 98.5 F (36.9 C) 98.9 F (37.2 C) 100 F (37.8 C)  TempSrc:    Axillary  SpO2: 98% 100% 99% 100%  Weight:      Height:        Author: Verline Lema, MD 06/25/2022 12:01 PM  For on call review [email or web address].

## 2022-06-25 NOTE — TOC Progression Note (Addendum)
Transition of Care [facility]) - Progression Note    Patient Details  Name: Christopher Murphy MRN: [id number] Date of Birth: [birth year]  Transition of Care [facility]) CM/SW [place], RN Phone Number: 06/25/2022, 11:30 AM  Clinical Narrative:    Sent the Hospital Bed order to [facility] as well as Harrel Lemon lift, The family wants to make sure the bed and lift is in the home prior to DC, I notified Darnelle Maffucci at [place], They requested it to be delivered today and if any reason it can not be they will let me know He will need EMS to transport to [address] Altha Harm [place] at [place]  Expected Discharge Plan: [place] Barriers to Discharge: No Barriers Identified  Expected Discharge Plan and Services   Discharge Planning Services: CM Consult Post Acute Care Choice: [place] arrangements for the past 2 months: Single Family Home Expected Discharge Date: 06/22/22                         HH Arranged: PT, OT HH Agency: [place] Date [facility] Agency Contacted: 06/20/22 Time [place]: [id number] Representative spoke with at [place]: [place] Determinants of Health ([place]) Interventions SDOH Screenings   Food Insecurity: No Food Insecurity (06/19/2022)  Housing: Low Risk  (06/19/2022)  Transportation Needs: No Transportation Needs (06/19/2022)  Utilities: Not At Risk (06/19/2022)  Depression (PHQ2-9): Low Risk  (05/23/2022)  Tobacco Use: Low Risk  (06/20/2022)    Readmission Risk Interventions     No data to display

## 2022-06-25 NOTE — Care Management Important Message (Signed)
Important Message  Patient Details  Name: Christopher Murphy MRN: [id number] Date of Birth: [birth year]   Medicare Important Message Given:  Yes     Dannette Barbara 06/25/2022, 1:23 PM

## 2022-06-25 NOTE — TOC Progression Note (Signed)
Transition of Care [facility]) - Progression Note    Patient Details  Name: Dantae Leisher MRN: [id number] Date of Birth: Aug 21, 1945  Transition of Care [facility]) CM/SW [place], RN Phone Number: 06/25/2022, 3:25 PM  Clinical Narrative:     Received a message from [place] at [place], they can not deliver the Bed and lift until tomorrow  Expected Discharge Plan: [place] Barriers to Discharge: No Barriers Identified  Expected Discharge Plan and Services   Discharge Planning Services: CM Consult Post Acute Care Choice: [place] arrangements for the past 2 months: Single Family Home Expected Discharge Date: 06/25/22                         HH Arranged: PT, OT HH Agency: [place] Date [facility] Agency Contacted: 06/20/22 Time [place]: [id number] Representative spoke with at [place]: [place] Determinants of Health ([place]) Interventions SDOH Screenings   Food Insecurity: No Food Insecurity (06/19/2022)  Housing: Low Risk  (06/19/2022)  Transportation Needs: No Transportation Needs (06/19/2022)  Utilities: Not At Risk (06/19/2022)  Depression (PHQ2-9): Low Risk  (05/23/2022)  Tobacco Use: Low Risk  (06/20/2022)    Readmission Risk Interventions     No data to display

## 2022-06-25 NOTE — Plan of Care (Signed)
  Problem: Education: Goal: Knowledge of General Education information will improve Description: Including pain rating scale, medication(s)/side effects and non-pharmacologic comfort measures Outcome: Progressing   Problem: Health Behavior/Discharge Planning: Goal: Ability to manage health-related needs will improve Outcome: Progressing   Problem: Clinical Measurements: Goal: Ability to maintain clinical measurements within normal limits will improve Outcome: Progressing Goal: Will remain free from infection Outcome: Progressing Goal: Diagnostic test results will improve Outcome: Progressing Goal: Respiratory complications will improve Outcome: Progressing Goal: Cardiovascular complication will be avoided Outcome: Progressing   Problem: Activity: Goal: Risk for activity intolerance will decrease Outcome: Progressing   Problem: Nutrition: Goal: Adequate nutrition will be maintained Outcome: Progressing   Problem: Coping: Goal: Level of anxiety will decrease Outcome: Progressing   Problem: Elimination: Goal: Will not experience complications related to bowel motility Outcome: Progressing Goal: Will not experience complications related to urinary retention Outcome: Progressing   Problem: Pain Managment: Goal: General experience of comfort will improve Outcome: Progressing   Problem: Safety: Goal: Ability to remain free from injury will improve Outcome: Progressing   Problem: Skin Integrity: Goal: Risk for impaired skin integrity will decrease Outcome: Progressing   Problem: Education: Goal: Knowledge of General Education information will improve Description: Including pain rating scale, medication(s)/side effects and non-pharmacologic comfort measures Outcome: Progressing   Problem: Health Behavior/Discharge Planning: Goal: Ability to manage health-related needs will improve Outcome: Progressing   Problem: Clinical Measurements: Goal: Ability to maintain  clinical measurements within normal limits will improve Outcome: Progressing Goal: Will remain free from infection Outcome: Progressing Goal: Diagnostic test results will improve Outcome: Progressing Goal: Respiratory complications will improve Outcome: Progressing Goal: Cardiovascular complication will be avoided Outcome: Progressing   Problem: Activity: Goal: Risk for activity intolerance will decrease Outcome: Progressing   Problem: Nutrition: Goal: Adequate nutrition will be maintained Outcome: Progressing   Problem: Coping: Goal: Level of anxiety will decrease Outcome: Progressing   Problem: Elimination: Goal: Will not experience complications related to bowel motility Outcome: Progressing Goal: Will not experience complications related to urinary retention Outcome: Progressing   Problem: Pain Managment: Goal: General experience of comfort will improve Outcome: Progressing   Problem: Safety: Goal: Ability to remain free from injury will improve Outcome: Progressing   Problem: Skin Integrity: Goal: Risk for impaired skin integrity will decrease Outcome: Progressing   Problem: Education: Goal: Verbalization of understanding the information provided (i.e., activity precautions, restrictions, etc) will improve Outcome: Progressing Goal: Individualized Educational Video(s) Outcome: Progressing   Problem: Activity: Goal: Ability to ambulate and perform ADLs will improve Outcome: Progressing   Problem: Clinical Measurements: Goal: Postoperative complications will be avoided or minimized Outcome: Progressing   Problem: Self-Concept: Goal: Ability to maintain and perform role responsibilities to the fullest extent possible will improve Outcome: Progressing   Problem: Pain Management: Goal: Pain level will decrease Outcome: Progressing   

## 2022-06-26 DIAGNOSIS — S72002D Fracture of unspecified part of neck of left femur, subsequent encounter for closed fracture with routine healing: Secondary | ICD-10-CM | POA: Diagnosis not present

## 2022-06-26 NOTE — TOC Progression Note (Signed)
Transition of Care [facility]) - Progression Note    Patient Details  Name: Christopher Murphy MRN: [id number] Date of Birth: 08/01/1945  Transition of Care [facility]) CM/SW [place], RN Phone Number: 06/26/2022, 12:38 PM  Clinical Narrative:     The patient will need to be transported by EMS to home, I reached out to [place] at [place] to check on the Hospital Bed delivery that is supposed to be delivered today, Awaiting a response   Expected Discharge Plan: [place] Barriers to Discharge: No Barriers Identified  Expected Discharge Plan and Services   Discharge Planning Services: CM Consult Post Acute Care Choice: [place] arrangements for the past 2 months: Single Family Home Expected Discharge Date: 06/26/22                         HH Arranged: PT, OT HH Agency: [place] Date [facility] Agency Contacted: 06/20/22 Time [place]: [id number] Representative spoke with at [place]: [place] Determinants of Health ([place]) Interventions SDOH Screenings   Food Insecurity: No Food Insecurity (06/19/2022)  Housing: Low Risk  (06/19/2022)  Transportation Needs: No Transportation Needs (06/19/2022)  Utilities: Not At Risk (06/19/2022)  Depression (PHQ2-9): Low Risk  (05/23/2022)  Tobacco Use: Low Risk  (06/20/2022)    Readmission Risk Interventions     No data to display

## 2022-06-26 NOTE — Progress Notes (Signed)
DISCHARGE NOTE:  Pts daughter-in-law given discharge instructions and verbalized understanding. Pt waiting on EMS for transportation home.

## 2022-06-26 NOTE — Progress Notes (Signed)
  Progress Note   Patient: Christopher Murphy [id number] DOB: Dec 02, 1945 DOA: 06/18/2022     7 DOS: the patient was seen and examined on 06/26/2022    Subjective: Patient seen and examined.   Patient could not be discharged yesterday as patient's family wanted hospital bed and lifted delivered at home before he leaves the hospital. Will discharge patient today after hospital bed is delivered Denies nausea vomiting chest pain     Brief Narrative:  77 year old with history of stroke and right hemiparesis, moderate vascular dementia who lives at home with his family presented with mechanical fall, he lost his balance getting up from the couch and falling on his left hip.  In the emergency room hemodynamically stable.  Skeletal survey negative except left femoral neck fracture.  Patient is s/p ORIF     Assessment & Plan:   Close traumatic right femoral neck fracture: Patient underwent ORIF  Orthopedic surgeon on board we appreciate input Continue PT OT Patient will be discharged home today to follow-up with OT and PT at home Social work is working on it Continue as needed analgesia   Chronic medical issues including Vascular dementia, on donepezil and Namenda.  Continued.  High risk of postop delirium.  All time fall precautions.  Delirium precautions. History of stroke with right hemiparesis: On aspirin and Crestor.  Continued.     Postoperative anemia No indication for acute blood transfusion at this time Monitor CBC closely   DVT prophylaxis: SCDs Start: 06/19/22 0008     Code Status: Full code Family Communication: Daughter in-law at the bedside Disposition Plan: Status is: Inpatient Family have made decision for patient to be discharged home with home health services in place   Consultants:  Orthopedics   Procedures:  ORIF done on 06/19/2022   Antimicrobials:  None       Physical Exam: Patient seen and examined at bedside this morning Surgical site  dressing appears clean and dry CVS heart sounds 1 and 2 present Abdomen appears nontender nondistended     Data Reviewed: No labs available for review today     Disposition: Status is: Inpatient    Planned Discharge Destination: Home with home health   Spent 30 minutes taking care of this patient today    Vitals:   06/25/22 0045 06/25/22 0747 06/25/22 1634 06/26/22 0818  BP: (!) 111/57 118/60 (!) 108/59 120/62  Pulse: 81 79 80 82  Resp: 18 16 18 18   Temp: 98.9 F (37.2 C) 100 F (37.8 C) 98.4 F (36.9 C) 98.1 F (36.7 C)  TempSrc:  Axillary    SpO2: 99% 100% 100% 100%  Weight:      Height:         Author: Verline Lema, MD 06/26/2022 11:47 AM  For on call review [email or web address].

## 2022-06-26 NOTE — Progress Notes (Signed)
Physical Therapy Treatment Patient Details Name: Royce Scheider MRN: [id number] DOB: April 06, 1946 Today's Date: 06/26/2022   History of Present Illness Pt is a 77 year old with history of stroke and right hemiparesis, moderate vascular dementia who lives at home with his family presented 06/19/22 with mechanical fall, he lost his balance getting up from the couch and falling on his left hip.  In the emergency room hemodynamically stable.  Skeletal survey negative except left femoral neck fracture.  S/P L femur ORIF. WBAT.    PT Comments    Pt pleasant and put forth good effort during the session with family available for translating.  Pt required extensive physical assistance with bed mobility tasks and to come to standing.  Pt required max multi-modal cuing for sequencing during gait training with very slow, effortful steps and occasional min A to guide the RW and to advance the RLE.  Pt took many short static standing rest breaks during gait with decreased activity tolerance compared to prior session.  See previous detailed PT notes regarding discussions with pt/family regarding discharge plan.       Recommendations for follow up therapy are one component of a multi-disciplinary discharge planning process, led by the attending physician.  Recommendations may be updated based on patient status, additional functional criteria and insurance authorization.  Follow Up Recommendations  Follow physician's recommendations for discharge plan and follow up therapies     Assistance Recommended at Discharge Frequent or constant Supervision/Assistance  Patient can return home with the following A lot of help with walking and/or transfers;A lot of help with bathing/dressing/bathroom;Assistance with cooking/housework;Assistance with feeding;Direct supervision/assist for medications management;Direct supervision/assist for financial management;Assist for transportation;Help with stairs or ramp for  entrance   Equipment Recommendations  BSC/3in1;Rolling walker (2 wheels);Wheelchair (measurements PT);Wheelchair cushion (measurements PT);Hospital bed;Other (comment)    Recommendations for Other Services       Precautions / Restrictions Precautions Precautions: Fall Restrictions Weight Bearing Restrictions: Yes LLE Weight Bearing: Weight bearing as tolerated     Mobility  Bed Mobility Overal bed mobility: Needs Assistance       Supine to sit: +2 for physical assistance, Max assist     General bed mobility comments: +2 max A for BLE and trunk control    Transfers Overall transfer level: Needs assistance Equipment used: Rolling walker (2 wheels) Transfers: Sit to/from Stand Sit to Stand: Mod assist, +2 physical assistance           General transfer comment: Max verbal and tactile cues for hand placement and increased trunk flexion    Ambulation/Gait Ambulation/Gait assistance: Min assist Gait Distance (Feet): 6 Feet Assistive device: Rolling walker (2 wheels) Gait Pattern/deviations: Step-to pattern, Decreased stance time - left, Decreased step length - right, Antalgic Gait velocity: decreased     General Gait Details: Max multi-modal cues for LLE step-to sequencing to assist with pain control during LLE stance phase; min A to guide the RW and on several occasions to advance the RLE   Stairs             Wheelchair Mobility    Modified Rankin (Stroke Patients Only)       Balance Overall balance assessment: Needs assistance Sitting-balance support: Feet supported, Bilateral upper extremity supported Sitting balance-Leahy Scale: Poor Sitting balance - Comments: Occasional min A to prevent posterior LOB   Standing balance support: Bilateral upper extremity supported, During functional activity, Reliant on assistive device for balance Standing balance-Leahy Scale: Fair  Cognition Arousal/Alertness:  Awake/alert Behavior During Therapy: WFL for tasks assessed/performed Overall Cognitive Status: History of cognitive impairments - at baseline                                          Exercises      General Comments        Pertinent Vitals/Pain Pain Assessment Pain Assessment: PAINAD Breathing: normal Negative Vocalization: occasional moan/groan, low speech, negative/disapproving quality Facial Expression: smiling or inexpressive Body Language: relaxed Consolability: distracted or reassured by voice/touch PAINAD Score: 2 Pain Location: L hip Pain Descriptors / Indicators: Discomfort, Grimacing, Guarding, Moaning Pain Intervention(s): Repositioned, Premedicated before session, Monitored during session    Home Living                          Prior Function            PT Goals (current goals can now be found in the care plan section) Progress towards PT goals: PT to reassess next treatment    Frequency    7X/week      PT Plan Current plan remains appropriate    Co-evaluation              AM-PAC PT "6 Clicks" Mobility   Outcome Measure  Help needed turning from your back to your side while in a flat bed without using bedrails?: A Lot Help needed moving from lying on your back to sitting on the side of a flat bed without using bedrails?: Total Help needed moving to and from a bed to a chair (including a wheelchair)?: A Lot Help needed standing up from a chair using your arms (e.g., wheelchair or bedside chair)?: A Lot Help needed to walk in hospital room?: A Lot Help needed climbing 3-5 steps with a railing? : Total 6 Click Score: 10    End of Session Equipment Utilized During Treatment: Gait belt Activity Tolerance: Patient limited by pain Patient left: in chair;with call bell/phone within reach;with chair alarm set;with family/visitor present;with nursing/sitter in room;Other (comment) (Pt left with nursing for  hygiene) Nurse Communication: Mobility status PT Visit Diagnosis: Difficulty in walking, not elsewhere classified (R26.2);Muscle weakness (generalized) (M62.81);History of falling (Z91.81);Pain Pain - Right/Left: Left Pain - part of body: Leg     Time: [id number] PT Time Calculation (min) (ACUTE ONLY): 24 min  Charges:  $Gait Training: 8-22 mins $Therapeutic Activity: 8-22 mins                    D. Scott Joslynne Klatt PT, DPT 06/26/22, 11:03 AM

## 2022-06-26 NOTE — TOC Progression Note (Signed)
Transition of Care [facility]) - Progression Note    Patient Details  Name: Christopher Murphy MRN: [id number] Date of Birth: [birth year]  [facility]) CM/SW [place], RN Phone Number: 06/26/2022, 2:17 PM  Clinical Narrative:    Called EMS to transport the patient to his home There are several ahead of him on the ems list   Expected Discharge Plan: [place] Barriers to Discharge: No Barriers Identified  Expected Discharge Plan and Services   Discharge Planning Services: CM Consult Post Acute Care Choice: [place] arrangements for the past 2 months: Single Family Home Expected Discharge Date: 06/26/22                         HH Arranged: PT, OT HH Agency: [place] Date [facility] Agency Contacted: 06/20/22 Time [place]: [id number] Representative spoke with at [place]: [place] Determinants of Health ([place]) Interventions SDOH Screenings   Food Insecurity: No Food Insecurity (06/19/2022)  Housing: Low Risk  (06/19/2022)  Transportation Needs: No Transportation Needs (06/19/2022)  Utilities: Not At Risk (06/19/2022)  Depression (PHQ2-9): Low Risk  (05/23/2022)  Tobacco Use: Low Risk  (06/20/2022)    Readmission Risk Interventions     No data to display

## 2022-06-26 NOTE — Plan of Care (Signed)
  Problem: Pain Managment: Goal: General experience of comfort will improve Outcome: Progressing   Problem: Safety: Goal: Ability to remain free from injury will improve Outcome: Progressing   Problem: Skin Integrity: Goal: Risk for impaired skin integrity will decrease Outcome: Progressing   

## 2022-06-26 NOTE — Progress Notes (Signed)
EMS arrived to transport patient home. Stable condition. Daughter-in-law at bedside. Personal belongings with family member.

## 2022-06-27 ENCOUNTER — Telehealth: Payer: Self-pay | Admitting: *Deleted

## 2022-06-27 ENCOUNTER — Other Ambulatory Visit: Payer: Self-pay | Admitting: *Deleted

## 2022-06-27 DIAGNOSIS — I639 Cerebral infarction, unspecified: Secondary | ICD-10-CM

## 2022-06-27 NOTE — Transitions of Care (Post Inpatient/ED Visit) (Signed)
   06/27/2022  Name: Christopher Murphy MRN: [id number] DOB: 1945-04-24  Today's TOC FU Call Status: Today's TOC FU Call Status:: Successful TOC FU Call Competed TOC FU Call Complete Date: 06/27/22  Transition Care Management Follow-up Telephone Call Date of Discharge: 06/25/22 Discharge Facility: [facility]) Type of Discharge: Inpatient Admission Primary Inpatient Discharge Diagnosis:: Closed fx hip How have you been since you were released from the hospital?: Better Any questions or concerns?: Yes Patient Questions/Concerns:: Is an Aide ordered Patient Questions/Concerns Addressed: Other: (RN discussed hat aide was not ordered. The agency is coming today and they can fax a request for an aide or we call call Webb Silversmith NP and asked for an order to be sent to [facility])  Items Reviewed: Did you receive and understand the discharge instructions provided?: Yes Medications obtained and verified?: Yes (Medications Reviewed) Any new allergies since your discharge?: No Dietary orders reviewed?: No Do you have support at home?: Yes People in Home: child(ren), adult Name of Support/Comfort Primary Source: Daughter in Sports coach and [place] and Equipment/Supplies: [place] Ordered?: Yes (PT/OT) Name of [place]:: [place] set up a time to come to your home?: Yes [place] Visit Date: 06/27/22 Any new equipment or medical supplies ordered?: Yes Name of Medical supply agency?: [employer] (hospital bed and lift) Were you able to get the equipment/medical supplies?: Yes Do you have any questions related to the use of the equipment/supplies?: No  Functional Questionnaire: Do you need assistance with bathing/showering or dressing?: Yes Do you need assistance with meal preparation?: Yes Do you need assistance with eating?: No Do you have difficulty maintaining continence: No Do you need assistance with getting out of  bed/getting out of a chair/moving?: Yes Do you have difficulty managing or taking your medications?: Yes  Follow up appointments reviewed: PCP Follow-up appointment confirmed?: [facility] Follow-up appointment confirmed?: Yes Date of Specialist follow-up appointment?: 07/09/22 Follow-Up Specialty Provider:: Reche Dixon [id number] 1:30 Do you need transportation to your follow-up appointment?: No Do you understand care options if your condition(s) worsen?: Yes-patient verbalized understanding  SDOH Interventions Today    Flowsheet Row Most Recent Value  SDOH Interventions   Food Insecurity Interventions Intervention Not Indicated  Housing Interventions Intervention Not Indicated  Transportation Interventions Intervention Not Indicated      Interventions Today    Flowsheet Row Most Recent Value  General Interventions   General Interventions Discussed/Reviewed General Interventions Discussed, General Interventions Reviewed, Doctor Visits  Doctor Visits Discussed/Reviewed Specialist  PCP/Specialist Visits Compliance with follow-up visit      TOC Interventions Today    Flowsheet Row Most Recent Value  TOC Interventions   TOC Interventions Discussed/Reviewed TOC Interventions Discussed, TOC Interventions Reviewed  [RN discussed How to request aide service with daughter in law/ RN made sure family understood how to use the lift]       [facility] [phone]

## 2022-06-27 NOTE — Progress Notes (Signed)
  Care Coordination   Note   06/27/2022 Name: Jetli Laubscher MRN: [id number] DOB: 18-Nov-1945  Daquante Callis is a 77 y.o. year old male who sees [place], Coralie Keens, NP for primary care. I reached out to Ferman Hamming by phone today to offer care coordination services.  Mr. Mccuskey was given information about Care Coordination services today including:   The Care Coordination services include support from the care team which includes your Nurse Coordinator, Clinical Social Worker, or Pharmacist.  The Care Coordination team is here to help remove barriers to the health concerns and goals most important to you. Care Coordination services are voluntary, and the patient may decline or stop services at any time by request to their care team member.   Care Coordination Consent Status: Patient agreed to services and verbal consent obtained.   Follow up plan:  Telephone appointment with care coordination team member scheduled for:  07/11/2022  Encounter Outcome:  Pt. Scheduled from referral   Julian Hy, [place] Direct Dial: [phone]

## 2022-07-11 ENCOUNTER — Ambulatory Visit: Payer: Self-pay | Admitting: *Deleted

## 2022-07-11 ENCOUNTER — Encounter: Payer: Self-pay | Admitting: *Deleted

## 2022-07-11 NOTE — Patient Outreach (Signed)
  Care Coordination   Initial Visit Note   07/11/2022 Name: Christopher Murphy MRN: [id number] DOB: 1945/10/20  Christopher Murphy is a 77 y.o. year old male who sees [place], Coralie Keens, NP for primary care. I spoke with daughter in law of Christopher Murphy by phone today.  What matters to the patients health and wellness today?  Patient had fall that resulted in hip fracture requiring surgery, discharged 3/19.  Per daughter in law, patient is doing better.  Had appointment with ortho on yesterday, xray was done to confirm healing and staples were removed.     Goals Addressed             This Visit's Progress    No recurrent falls       Care Coordination Interventions: Provided written and verbal education re: potential causes of falls and Fall prevention strategies Reviewed medications and discussed potential side effects of medications such as dizziness and frequent urination Advised patient of importance of notifying provider of falls Assessed for falls since last encounter Screening for signs and symptoms of depression related to chronic disease state  Assessed social determinant of health barriers Upcoming appointments with ortho on 5/1 and neuro on 5/15 Discussed involvement with home health for PT/OT         SDOH assessments and interventions completed:  Yes  SDOH Interventions    Flowsheet Row Telephone from 06/27/2022 in [place] Visit from 07/19/2021 in [facility]  SDOH Interventions    Food Insecurity Interventions Intervention Not Indicated --  Housing Interventions Intervention Not Indicated --  Transportation Interventions Intervention Not Indicated --  Depression Interventions/Treatment  -- PHQ2-9 Score <4 Follow-up Not Indicated          Care Coordination Interventions:  Yes, provided   Interventions Today    Flowsheet Row Most Recent Value  Chronic Disease    Chronic disease during today's visit Other  [fall]  General Interventions   General Interventions Discussed/Reviewed General Interventions Reviewed, Doctor Visits  Doctor Visits Discussed/Reviewed Doctor Visits Reviewed, PCP, Specialist  PCP/Specialist Visits Compliance with follow-up visit  Safety Interventions   Safety Discussed/Reviewed Fall Risk, Safety Reviewed        Follow up plan: Follow up call scheduled for 5/17    Encounter Outcome:  Pt. Visit Completed   Valente David, RN, MSN, [place] Care Management Care Management Coordinator [phone]

## 2022-07-11 NOTE — Patient Outreach (Signed)
  Care Coordination   07/11/2022 Name: Alexi Herrero MRN: [id number] DOB: 1946/02/10   Care Coordination Outreach Attempts:  An unsuccessful telephone outreach was attempted for a scheduled appointment today.  Follow Up Plan:  Additional outreach attempts will be made to offer the patient care coordination information and services.   Encounter Outcome:  No Answer   Care Coordination Interventions:  No, not indicated    Valente David, RN, MSN, [facility] Care Management Care Management Coordinator [phone]

## 2022-07-25 ENCOUNTER — Telehealth: Payer: Medicare Other | Admitting: Physician Assistant

## 2022-07-25 DIAGNOSIS — H1033 Unspecified acute conjunctivitis, bilateral: Secondary | ICD-10-CM

## 2022-07-25 MED ORDER — POLYMYXIN B-TRIMETHOPRIM 10000-0.1 UNIT/ML-% OP SOLN: OPHTHALMIC | 0 refills | Status: DC

## 2022-07-25 NOTE — Progress Notes (Signed)

## 2022-07-25 NOTE — Progress Notes (Signed)
I have spent 5 minutes in review of e-visit questionnaire, review and updating patient chart, medical decision making and response to patient.   Shandale Malak Cody Britni Driscoll, PA-C    

## 2022-07-26 ENCOUNTER — Telehealth: Payer: Self-pay | Admitting: Internal Medicine

## 2022-07-26 NOTE — Telephone Encounter (Signed)
Home Health Verbal Orders - Caller/Agency: Shireen  with [facility] Number: [phone]  Requesting OT/PT/Skilled Nursing/Social Work/Speech Therapy: OT  Frequency: 1 week 6

## 2022-07-27 NOTE — Telephone Encounter (Signed)
I called and left a detail message on the Shireen, OT personal voicemail for a approval for therapy 1 week 6.

## 2022-07-27 NOTE — Telephone Encounter (Signed)
Okay for verbal orders as requested 

## 2022-08-21 ENCOUNTER — Other Ambulatory Visit: Payer: Self-pay | Admitting: Neurology

## 2022-08-21 ENCOUNTER — Other Ambulatory Visit: Payer: Self-pay | Admitting: Internal Medicine

## 2022-08-21 DIAGNOSIS — H1033 Unspecified acute conjunctivitis, bilateral: Secondary | ICD-10-CM

## 2022-08-22 ENCOUNTER — Encounter: Payer: Self-pay | Admitting: Neurology

## 2022-08-22 ENCOUNTER — Ambulatory Visit (INDEPENDENT_AMBULATORY_CARE_PROVIDER_SITE_OTHER): Payer: Medicare Other | Admitting: Neurology

## 2022-08-22 VITALS — BP 109/60 | HR 75 | Ht 69.0 in | Wt 145.0 lb

## 2022-08-22 DIAGNOSIS — R55 Syncope and collapse: Secondary | ICD-10-CM

## 2022-08-22 DIAGNOSIS — F0150 Vascular dementia without behavioral disturbance: Secondary | ICD-10-CM

## 2022-08-22 NOTE — Telephone Encounter (Signed)
Requested medication (s) are due for refill today: yes  Requested medication (s) are on the active medication list: yes  Last refill:  07/25/22  Future visit scheduled: no  Notes to clinic:  Unable to refill per protocol, last refill by another provider. Routing for approval.     Requested Prescriptions  Pending Prescriptions Disp Refills   trimethoprim-polymyxin b (POLYTRIM) ophthalmic solution [Pharmacy Med Name: POLYMYXIN B-TMP EYE DROPS] 10 mL 0    Sig: INSTILL 1 DROP INTO BOTH EYES EVERY 4 HOURS     Off-Protocol Failed - 08/21/2022  9:37 PM      Failed - Medication not assigned to a protocol, review manually.      Passed - Valid encounter within last 12 months    Recent Outpatient Visits           2 months ago Acute bacterial bronchitis   [place] [facility] [place], Salvadore Oxford, NP   3 months ago Aortic atherosclerosis [facility])   [place] [facility] [place], Salvadore Oxford, NP   1 year ago Generalized weakness   [place] [facility] [place], Salvadore Oxford, NP   1 year ago Aortic atherosclerosis [facility])   [place] [facility] [place], Salvadore Oxford, [place]

## 2022-08-22 NOTE — Patient Instructions (Signed)
I had a long discussion the patient and his son regarding his mild vascular dementia and remote stroke and recommend he continue Aricept 10 mg daily as well as Namenda 10 mg twice daily.  I also reviewed his recent hospitalization for syncopal episode and hip fracture I also advised him to continue participation in cognitively challenging activities like solving crossword puzzles, playing bridge and sodoku.  He was advised to continue aspirin alone and discontinue Plavix for stroke prevention and maintain aggressive risk factor modification with strict control of hypertension with blood pressure goal below 130/90 and lipids with LDL cholesterol goal below 70 mg percent.  He will return for follow-up in a year or call earlier if necessary.

## 2022-08-22 NOTE — Progress Notes (Signed)
[facility] [address] [place]. [place] [address] [phone]       OFFICE FOLLOW UP VISIT NOTE  [name] Date of Birth:  02/16/1946 Medical Record Number:  [id number]   Referring MD: Webb Silversmith, NP Reason for Referral: TIA HPI: Initial visit 03/11/2018: [name] is a pleasant 77 year old [place] Sikh male who seen today for initial office consultation visit.  He is accompanied by his son and daughter-in-law who provide most of the history.  I have reviewed imaging films in PACS.  The patient has had recurrent transient episodes of speech difficulties for the last 10 years.  The episodes are stereotypical.  The patient struggles to speak at times and get words out.  At times his speech is gibberish and nonsensical.  These typically last only few minutes around 5-10 or so but he has had a couple episodes which lasted for an hour or longer.  During these prolonged episodes he is been found to be confused and disoriented.  Interestingly patient had a similar episode on 04/05/2016 when he was admitted to [facility].  He had MRI scan of the brain at that time which I personally reviewed and shows interesting diffuse hyperintensity throughout the cerebral white matter raising concern for seizures.  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  EEG was apparently not done.  Patient was not started on seizure medications.  Patient has history of left posterior cerebral artery infarct in 2009 in [place].  He was placed on a combination tablet of aspirin and Plavix as well as medication for lipids.  He had cognitive impairment and mild memory loss and was started on Namenda as well.  He is presently on Namenda 5 mg twice daily.  Family states that he is cognitively stable and does have short-term memory difficulties but long-term memory is good.  He is living  at home with family.  He needs only mild help with activities of daily living.  He can ambulate independently.  He has not had any witnessed generalized tonic-clonic seizure significant head injury or loss of consciousness.  The patient actually had gone to [place] following this episode in December 2017 for nearly 3 years before he came back.  He was seen by neurologist there but details are not known.  He was never placed on seizure medications. Update 08/27/2019 : He returns for follow-up after last visit a year and half ago.  Is accompanied by his son.  The patient continues to have mild memory and cognitive difficulties which appear to be unchanged and not particularly progressive.  He remains on Namenda 5 mg twice daily which is tolerating well.  He has never tried a higher dose.  Patient is not had many episodes of transient speech difficulties since her last visit.  He did undergo MRI scan of the brain on 03/31/2018 which showed old left PCA as well as few remote age lacunar infarcts and changes of small vessel disease.  No acute abnormalities.  MRA of the brain and neck both did not show any large vessel stenosis or occlusion.  EEG on 03/25/2018 was normal.  Patient had a colonoscopy done on 04/30/2019 and had a 3 mm polyp excised.  He has been having some swallowing difficulties of late and saw gastroenterologist in [place].  Modified barium swallow was done by speech therapy there but I do not have the report but the  patient's son was informed that it was fine.  He has no new complaints. Update 11/17/2019 : He returns for follow-up after last visit 3 months ago.  Is accompanied by his daughter-in-law.  They state that he had a episode of increasing confusion and hallucination as well as delusion lasting 3 days following a bladder infection and treatment with antibiotics.  He was admitted to the hospital and UA showed mild microscopic hematuria and he had recent UTI treatment.  Chest x-ray was  unremarkable.  Chest CT was also done which showed no significant abnormalities.  MRI scan of the brain showing progressive cerebral atrophy and old left PCA infarct but no acute abnormality.  2D echo showed normal ejection fraction.  Carotid ultrasound showed no significant extracranial stenosis.  His confusion improved over 3 days and he has done very well for the last week or so now and is at home.  Is back to his baseline.  He has a renal ultrasound pending for his hematuria.  He remains on aspirin and Plavix which is tolerating well without any external bleeding or bruising.  Mini-Mental status exam today scored 18/30 with deficits in orientation and recall.  He was able to copy intersecting pentagons but difficulty naming animals.  He has not been tried on Aricept so far.  He has not had recurrent TIA or stroke symptoms.  He has been taking Namenda 10 mg twice daily now for several months but family has not noticed any benefit.  Patient is currently not having any delusions, hallucinations, agitation or unsafe behavior.  He can walk independently but his gait has become more stooped but he has not had falls or injuries. Update 02/08/2020: He returns for follow-up after last visit to enough months ago.  Is accompanied by his daughter-in-law.  Patient has not had any further episodes of confusion hallucinations or delusions.  He is tolerating Aricept 10 mg daily well but feels appetite may have gone down.  He is however not lost weight.  He is cognitively unchanged and there has been no specific improvement noted.  Is tolerating both Namenda and Aricept fairly well.  Has had no recurrent stroke or TIA symptoms.  He remains on aspirin and Crestor both of which is tolerating well.  Blood pressures well controlled today it is 120/67.  He has no new complaints. Update 08/08/2020 : He returns for follow-up after last visit 6 months ago.  He is accompanied by his daughter-in-law.  Patient continues to have cognitive  impairment which appears to have worsened slightly after recent COVID infection in January 2022.Marland Kitchen  Patient had some aspiration pneumonia during that hospitalization as well.  He was quite confused and disoriented but gradually seems to have returned almost back to his baseline.  He still needs constant supervision at home.  He is lost some of independence in activities like dressing himself and cleaning himself which he was previously able to do by himself.  He can still walk independently.  He had some trouble swallowing and speech therapy has recommended that he eat soft food only.  He has had no recurrent stroke or TIA.  He remains on aspirin which is tolerating well without side effects.  He is also tolerating Aricept and Namenda without side effects.  Blood pressure is well controlled and today it is 118/76. Update 08/09/2021 : She returns for follow-up after last visit a year ago.  He is accompanied by his daughter-in-law.  He continues to have significant cognitive impairment and memory difficulties.  He has good days and bad days.  There are times when he cannot recognize family members and forgets the names.  He had a trip to [place] from November last year to January this year.  He can cannot recall any details of the trip.  He continues to have occasional episodes of intermittent babbling speech and speech difficulties with the last episode being 2 months ago.  This lasted few hours and he went to sleep and was fine next day morning when he woke up.  He was diagnosed with aortic root dilatation with family obviously wants conservative follow-up.  He remains on aspirin Perin and Plavix and tolerating well without bruising or bleeding.  He has had no recurrent stroke or TIA symptoms.  Continues to have right-sided peripheral visual defect as well as some dragging of his right leg while walking.  His blood pressure under good control and today it is 126/74.  He is tolerating Crestor well without muscle aches  and pains.  He remains on Aricept and Namenda and is tolerating both medications well without side effects.  Update 08/22/2022 : He returns for follow-up after last visit a year ago.  He is accompanied by his son.  Patient was admitted to [facility] on 03/11/2022 following what appeared to be a syncopal episode.  He was sitting on the table when he send weakness and to throw his head back and become unresponsive.  He did not answer any questions.  EMS were called and they noticed his blood pressure to be low hypoxic.  Improved slightly with oxygenation.  He gradually regained consciousness upon arrival to the hospital.  He was started on a syncopal episode.  MRI scan of the brain was obtained with showed ill-defined diffusion hyperintensity in the bifrontal subcortical white matter right greater than left close possibly T2 shine through as it is seen in bright signal on T2 images as well.  EEG showed mild diffuse encephalopathy without definite seizure activity.  CT angiogram showed chronic left PCA occlusion without any new abnormalities.  LDL cholesterol 60 mg percent.  Hemoglobin A1c was 5.6.  Patient was felt to have altered mental status secondary to hypotension, syncope gradually improved after 24 hours.  He unfortunately had a mechanical fall at home 06/18/2022 and fractured his left.  He required open reduction internal fixation on 06/19/22.  He is presently at home.  He has finished home physical and occupational therapy last week.  He is able to ambulate with a walker.  He has no further falls.  Remains on Aricept and Namenda which is tolerating well without side effects.  Currently tolerating well without bruising or bleeding.  His blood pressure is 109/60 today.     ROS:   14 system review of systems is positive for passing out episode, confusion, disorientation, fall, hip fracture speech difficulties, memory loss, confusion, decreased attention, disorientation decreased appetite,and all other  systems negative  PMH:  Past Medical History:  Diagnosis Date   Blood clots in brain 2009   Stroke [facility])     Social History:  Social History   Socioeconomic History   Marital status: Married    Spouse name: Not on file   Number of children: Not on file   Years of education: Not on file   Highest education level: Not on file  Occupational History   Not on file  Tobacco Use   Smoking status: Never   Smokeless tobacco: Never  Vaping Use   Vaping Use: Never  used  Substance and Sexual Activity   Alcohol use: No   Drug use: No   Sexual activity: Not on file  Other Topics Concern   Not on file  Social History Narrative   Lives with wife, son and daughter in law + 2 children   Right Handed   Drinks no caffeine   Social Determinants of Health   Financial Resource Strain: Not on file  Food Insecurity: No Food Insecurity (06/27/2022)   Hunger Vital Sign    Worried About Running Out of Food in the Last Year: Never true    Ran Out of Food in the Last Year: Never true  Transportation Needs: No Transportation Needs (06/27/2022)   PRAPARE - Administrator, Civil Service (Medical): No    Lack of Transportation (Non-Medical): No  Physical Activity: Not on file  Stress: Not on file  Social Connections: Not on file  Intimate Partner Violence: Not At Risk (06/19/2022)   Humiliation, Afraid, Rape, and Kick questionnaire    Fear of Current or Ex-Partner: No    Emotionally Abused: No    Physically Abused: No    Sexually Abused: No    Medications:   Current Outpatient Medications on File Prior to Visit  Medication Sig Dispense Refill   albuterol (VENTOLIN HFA) 108 (90 Base) MCG/ACT inhaler Inhale 2 puffs into the lungs every 6 (six) hours as needed for wheezing or shortness of breath. 8 g 0   aspirin EC (ASPIRIN LOW DOSE) 81 MG tablet Take 1 tablet (81 mg total) by mouth daily. Swallow whole. 30 tablet 12   benzonatate (TESSALON PERLES) 100 MG capsule Take 1 capsule (100  mg total) by mouth 3 (three) times daily as needed. 20 capsule 0   clopidogrel (PLAVIX) 75 MG tablet Take 75 mg by mouth daily.     docusate sodium (COLACE) 100 MG capsule Take 1 capsule (100 mg total) by mouth 2 (two) times daily. 10 capsule 0   donepezil (ARICEPT) 10 MG tablet TAKE 1 TABLET BY MOUTH EVERYDAY AT BEDTIME 90 tablet 1   folic acid (FOLVITE) 1 MG tablet Take 1 mg by mouth daily.     memantine (NAMENDA) 10 MG tablet TAKE 1 TABLET BY MOUTH TWICE A DAY 180 tablet 4   Multiple Vitamin (MULTIVITAMIN WITH MINERALS) TABS tablet Take 1 tablet by mouth daily. 30 tablet 1   ondansetron (ZOFRAN) 4 MG tablet Take 1 tablet (4 mg total) by mouth every 6 (six) hours as needed for nausea. 20 tablet 0   pantoprazole (PROTONIX) 40 MG tablet TAKE 1 TABLET BY MOUTH EVERY DAY 90 tablet 1   Polyethyl Glycol-Propyl Glycol (SYSTANE) 0.4-0.3 % SOLN Apply 1 drop to eye 2 (two) times daily. (Patient taking differently: Place 1 drop into both eyes 2 (two) times daily.) 30 mL 5   polyethylene glycol (MIRALAX / GLYCOLAX) 17 g packet Take 17 g by mouth daily. 14 each 0   rosuvastatin (CRESTOR) 20 MG tablet TAKE 1 TABLET BY MOUTH EVERY DAY 90 tablet 0   trimethoprim-polymyxin b (POLYTRIM) ophthalmic solution Apply 1-2 drops into affected eye QID x 5 days. 10 mL 0   No current facility-administered medications on file prior to visit.    Allergies:  No Known Allergies  Physical Exam General: Frail elderly [place] Sikh male, seated, in no evident distress Head: head normocephalic and atraumatic.   Neck: supple with no carotid or supraclavicular bruits Cardiovascular: regular rate and rhythm, soft ejection systolic murmur.  Musculoskeletal: no deformity Skin:  no rash/petichiae Vascular:  Normal pulses all extremities  Neurologic Exam Mental Status: Awake and fully alert. Oriented to place and time. Recent and remote memory diminished. Attention span, concentration and fund of knowledge slightly  reduced.  Mini-Mental status exam not done.  Diminished recall    Mood and affect appropriate.   Mini-Mental status exam  not done (prior visit scored 15/30 with deficits in orientation, attention, calculation recall and reading and writing) . Cranial Nerves: Fundoscopic exam not done. Pupils equal, briskly reactive to light. Extraocular movements full without nystagmus mild mechanical ptosis of the left eye.. 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 Hearing intact. Facial sensation intact. Face, tongue, palate moves normally and symmetrically.  Motor: Normal bulk and tone. Normal strength in all tested extremity muscles.  Diminished fine finger movements on the right.  Orbits left over right upper extremity.  Increased tone in the right leg with stiffness and mild ankle dorsiflexor weakness. Sensory.: intact to touch , pinprick , position and vibratory sensation.  Coordination: Rapid alternating movements normal in all extremities. Finger-to-nose and heel-to-shin performed accurately bilaterally. Gait and Station:  deferred as he did not bring his walker and is in a wheelchair toiday Reflexes: 1+ and symmetric. Toes downgoing.       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  Vascular risk factors of hyperlipidemia, age and cerebrovascular disease.     PLAN: I had a long discussion the patient and his son regarding his mild vascular dementia and remote stroke and recommend he continue Aricept 10 mg daily as well as Namenda 10 mg twice daily.  I also reviewed his recent hospitalization for syncopal episode and hip fracture I also advised him to continue  participation in cognitively challenging activities like solving crossword puzzles, playing bridge and sodoku.  He was advised to continue aspirin alone and discontinue Plavix for stroke prevention and maintain aggressive risk factor modification with strict control of hypertension with blood pressure goal below 130/90 and lipids with LDL cholesterol goal below 70 mg percent.  He will return for follow-up in a year or call earlier if necessary.  Greater than 50% time during this 35-minute visit was spent on counseling and coordination of care about his episode of confusion, remote TIA and mild dementia and answering questions. Delia Heady, MD  [facility] [address] [place], [place] [address]  Phone [phone] Fax [phone] Note: This document was prepared with digital dictation and possible smart phrase technology. Any transcriptional errors that result from this process are unintentional.

## 2022-08-24 ENCOUNTER — Ambulatory Visit: Payer: Self-pay | Admitting: *Deleted

## 2022-08-24 NOTE — Patient Outreach (Signed)
  Care Coordination   08/24/2022 Name: Christopher Murphy MRN: [id number] DOB: 1946-04-03   Care Coordination Outreach Attempts:  An unsuccessful telephone outreach was attempted for a scheduled appointment today.  Follow Up Plan:  Additional outreach attempts will be made to offer the patient care coordination information and services.   Encounter Outcome:  No Answer   Care Coordination Interventions:  No, not indicated    Kemper Durie, RN, MSN, [facility] Care Management Care Management Coordinator [phone]

## 2022-08-28 ENCOUNTER — Encounter: Payer: Self-pay | Admitting: Internal Medicine

## 2022-08-28 ENCOUNTER — Other Ambulatory Visit: Payer: Self-pay | Admitting: Internal Medicine

## 2022-08-29 MED ORDER — ROSUVASTATIN CALCIUM 20 MG PO TABS: 20.0000 mg | ORAL_TABLET | Freq: Every day | ORAL | 0 refills | Status: DC

## 2022-08-29 MED ORDER — TRIAMCINOLONE ACETONIDE 0.1 % EX CREA: 1.0000 | TOPICAL_CREAM | Freq: Two times a day (BID) | CUTANEOUS | 0 refills | Status: DC

## 2022-08-29 MED ORDER — FOLIC ACID 1 MG PO TABS: 1.0000 mg | ORAL_TABLET | Freq: Every day | ORAL | 0 refills | Status: DC

## 2022-09-05 ENCOUNTER — Telehealth: Payer: Self-pay | Admitting: *Deleted

## 2022-09-05 ENCOUNTER — Telehealth: Payer: Self-pay

## 2022-09-05 NOTE — Telephone Encounter (Signed)
noted 

## 2022-09-05 NOTE — Telephone Encounter (Signed)
Copied from CRM [id number]. Topic: General - Other >> Sep 05, 2022  9:38 AM Geoffry Paradise G wrote: Patient refused Occupational Therapy visit last week. So Occpational Therapy has been moved to this week.

## 2022-09-05 NOTE — Progress Notes (Signed)
  Care Coordination Note  09/05/2022 Name: [name] MRN: [id number] DOB: [birth year]  Christopher Murphy is a 77 y.o. year old male who is a primary care patient of Sampson Si, Salvadore Oxford, NP and is actively engaged with the care management team. I reached out to Christen Butter by phone today to assist with re-scheduling a follow up visit with the RN Case Manager  Follow up plan: Unsuccessful telephone outreach attempt made. A HIPAA compliant phone message was left for the patient providing contact information and requesting a return call.   Burman Nieves, CCMA Care Coordination Care Guide Direct Dial: [phone]

## 2022-09-05 NOTE — Telephone Encounter (Signed)
FYI

## 2022-09-27 NOTE — Progress Notes (Signed)
  Care Coordination Note  09/27/2022 Name: Christopher Murphy MRN: [id number] DOB: 03/21/1946  Christopher Murphy is a 77 y.o. year old male who is a primary care patient of Sampson Si, Salvadore Oxford, NP and is actively engaged with the care management team. I reached out to Christen Butter by phone today to assist with re-scheduling a follow up visit with the RN Case Manager  Follow up plan: We have been unable to make contact with the patient for follow up.   Burman Nieves, CCMA Care Coordination Care Guide Direct Dial: [phone]

## 2022-11-21 ENCOUNTER — Other Ambulatory Visit: Payer: Self-pay | Admitting: Internal Medicine

## 2022-11-21 ENCOUNTER — Telehealth (INDEPENDENT_AMBULATORY_CARE_PROVIDER_SITE_OTHER): Payer: Medicare Other | Admitting: Internal Medicine

## 2022-11-21 ENCOUNTER — Encounter: Payer: Self-pay | Admitting: Internal Medicine

## 2022-11-21 DIAGNOSIS — G4700 Insomnia, unspecified: Secondary | ICD-10-CM | POA: Insufficient documentation

## 2022-11-21 DIAGNOSIS — N3944 Nocturnal enuresis: Secondary | ICD-10-CM

## 2022-11-21 DIAGNOSIS — Z8673 Personal history of transient ischemic attack (TIA), and cerebral infarction without residual deficits: Secondary | ICD-10-CM

## 2022-11-21 DIAGNOSIS — I69351 Hemiplegia and hemiparesis following cerebral infarction affecting right dominant side: Secondary | ICD-10-CM

## 2022-11-21 DIAGNOSIS — F01B0 Vascular dementia, moderate, without behavioral disturbance, psychotic disturbance, mood disturbance, and anxiety: Secondary | ICD-10-CM

## 2022-11-21 DIAGNOSIS — R053 Chronic cough: Secondary | ICD-10-CM

## 2022-11-21 DIAGNOSIS — I7121 Aneurysm of the ascending aorta, without rupture: Secondary | ICD-10-CM

## 2022-11-21 DIAGNOSIS — G4701 Insomnia due to medical condition: Secondary | ICD-10-CM

## 2022-11-21 DIAGNOSIS — I700 Atherosclerosis of aorta: Secondary | ICD-10-CM

## 2022-11-21 DIAGNOSIS — D508 Other iron deficiency anemias: Secondary | ICD-10-CM

## 2022-11-21 DIAGNOSIS — K219 Gastro-esophageal reflux disease without esophagitis: Secondary | ICD-10-CM

## 2022-11-21 MED ORDER — MIRABEGRON ER 25 MG PO TB24: 25.0000 mg | ORAL_TABLET | Freq: Every day | ORAL | 0 refills | Status: DC

## 2022-11-21 MED ORDER — TRAZODONE HCL 50 MG PO TABS: 25.0000 mg | ORAL_TABLET | Freq: Every evening | ORAL | 1 refills | Status: DC | PRN

## 2022-11-21 NOTE — Assessment & Plan Note (Signed)
Continue atorvastatin and aspirin Encouraged low-fat diet

## 2022-11-21 NOTE — Assessment & Plan Note (Signed)
Continue memantine and donepezil as prescribed by neurology

## 2022-11-21 NOTE — Patient Instructions (Signed)
 Dementia Caregiver Guide Dementia is a condition that affects the way the brain works. It often affects thinking and memory. A person with dementia may: Forget things. Have trouble talking or responding to your questions. Have trouble paying attention. Have trouble thinking clearly and making good decisions. Get lost or wander away from home or other places. Have big changes in their mood or emotions. They may: Feel very worried, nervous, or depressed. Have angry outbursts. Be suspicious or accuse you of things. Have childlike behavior and language. Taking care of someone with dementia can be a challenge. The tips below can help you care for the person. How to help manage lifestyle changes Dementia usually gets worse slowly over time. In the early stages, people with dementia can stay safe and take care of themselves with some help. In later stages, they need help with daily tasks like getting dressed, grooming, and going to the bathroom. Communicating When the person is talking and seems frustrated, make eye contact and hold the person's hand. Ask questions that can be answered with a yes or no. Use simple words and a calm voice. Only give one direction at a time. Limit choices for the person. Too many choices can be stressful. Avoid correcting the person in a negative way. If the person can't find the right words, gently try to help. Preventing injury  Keep floors clear. Remove rugs, magazine racks, and floor lamps. Keep hallways well lit, especially at night. Put a handrail and nonslip mat in the bathtub or shower. Put childproof locks on cabinets that have dangerous items in them. These items include medicine, alcohol, guns, cleaning products, and sharp tools. For doors to the outside, put locks where the person can't see or reach them. This helps keep the person from going out of the house and getting lost. Be ready for emergencies. Keep a list of emergency phone numbers and  addresses close by. Remove car keys and lock garage doors so the person doesn't try to drive. Have the person wear a bracelet that tracks where they are and shows that they're a person with memory loss. This should be worn at all times for safety. Helping with daily life  Keep the person on track with their daily routine. Try to identify areas where the person may need help. Be supportive, patient, calm, and encouraging. Gently remind the person that adjusting to changes takes time. Help with the tasks that the person has asked for help with. Keep the person involved in daily tasks and decisions as much as you can. Encourage conversation, but try not to get frustrated if the person struggles to find words or doesn't seem to appreciate your help. Other tips Think about any safety risks and take steps to avoid them. Keep things organized: Organize medicines in a pill box for each day of the week. Keep a calendar in a central place. Use it to remind the person of health care visits or other activities. Create a plan to handle any legal or financial matters. Get help from a professional if needed. Help make sure the person: Takes medicines only as told by their health care providers. Eats regular, healthy meals. They should also drink plenty of fluids. Goes to all scheduled health care appointments. Gets regular sleep. Taking care of yourself Being a caregiver for someone with dementia can be hard. You may feel stressed and have many other emotions. It's important to also take care of yourself. Here are some tips: Find out about services that  can provide short-term care for the person. This is called respite care. It can allow you to take a break when you need one. Find healthy ways to deal with stress. Some ways include: Spending time with other people. Exercising. Meditating or doing deep breathing exercises. Take care of your own health by: Getting enough sleep. Eating healthy  foods. Getting regular exercise. Join a support group with others who are caregivers. These groups can help you: Learn other ways to deal with stress. Share experiences with others. Get emotional comfort and support. Learn about caregiving as the disease gets worse. Find resources in your community. Where to find support: Many people and organizations offer support. These include: Support groups for people with dementia. Support groups for caregivers. Counselors or therapists. Home health care services. Adult day care centers. Where to find more information Alzheimer's Association: WesternT[employer].it Family Caregiver Alliance: caregiver.org Alzheimer's Foundation of [place]: alzfdn.org Contact a health care provider if: The person's health is quickly getting worse. You're no longer able to care for the person. Caring for the person is affecting your physical and emotional health. You're feeling worried, nervous, or depressed about caring for the person. Get help right away if: You feel like the person may hurt themselves or others. The person has talked about taking their own life. These symptoms may be an emergency. Take one of these steps right away: Go to your nearest emergency room. Call 911. Call the National Suicide Prevention Lifeline at 401 856 7434 or 988. Text the Crisis Text Line at 320-444-2567. This information is not intended to replace advice given to you by your health care provider. Make sure you discuss any questions you have with your health care provider. Document Revised: 07/06/2022 Document Reviewed: 07/06/2022 Elsevier Patient Education  2024 [employer].

## 2022-11-21 NOTE — Assessment & Plan Note (Signed)
Will trial trazodone

## 2022-11-21 NOTE — Assessment & Plan Note (Signed)
No recent follow-up on file

## 2022-11-21 NOTE — Assessment & Plan Note (Signed)
Encouraged regular exercise for strengthening

## 2022-11-21 NOTE — Assessment & Plan Note (Signed)
-  Continue oral iron 

## 2022-11-21 NOTE — Assessment & Plan Note (Signed)
Continue rosuvastatin and aspirin °

## 2022-11-21 NOTE — Assessment & Plan Note (Signed)
Will trial Myrbetriq Rx for DME condom catheters provided

## 2022-11-21 NOTE — Progress Notes (Signed)
Virtual Visit via Video Note  I connected with Christopher Murphy on 11/21/22 at 11:20 AM EDT by a video enabled telemedicine application and verified that I am speaking with the correct person using two identifiers.  Location: Patient: Home Provider: Office  Persons participating in this video call: Nicki Reaper, NP, [name] and daughter in law   I discussed the limitations of evaluation and management by telemedicine and the availability of in person appointments. The patient expressed understanding and agreed to proceed.  History of Present Illness:  Patient due for follow-up of chronic conditions.  HLD with aortic atherosclerosis, history of TIA: Mainly cognitive.  His last LDL was 60, triglycerides 58, 03/2022.  He denies myalgias on rosuvastatin.  He is taking aspirin as well.  He follows with neurology.  Thoracic aortic aneurysm: CT of chest from 04/2020 reviewed.  He does not follow with vascular.  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  He is taking donepezil and memantine as prescribed.  He follows with neurology.  Chronic cough: Intermittent.  Managed with albuterol as needed.  There are no PFTs on file.  Anemia: His last H/H was 8.1/25, 06/2022.  He is not taking oral iron at this time.  He does not follow with hematology.  GERD: He takes pantoprazole as needed. There is no upper GI on file.   They have started noticed some urinary incontinence.  They report this only occurs at night.  They are using condom catheters and would like a prescription for DME for this.  They report he has been on Flomax in the past but is not currently taking this.  Past Medical History:  Diagnosis Date   Blood clots in brain 2009   Stroke [facility])     Current Outpatient Medications  Medication Sig Dispense Refill   albuterol (VENTOLIN HFA) 108 (90 Base) MCG/ACT inhaler Inhale 2 puffs into the lungs every 6 (six) hours as needed for wheezing or shortness of breath. 8 g 0   aspirin EC  (ASPIRIN LOW DOSE) 81 MG tablet Take 1 tablet (81 mg total) by mouth daily. Swallow whole. 30 tablet 12   benzonatate (TESSALON PERLES) 100 MG capsule Take 1 capsule (100 mg total) by mouth 3 (three) times daily as needed. 20 capsule 0   clopidogrel (PLAVIX) 75 MG tablet Take 75 mg by mouth daily.     docusate sodium (COLACE) 100 MG capsule Take 1 capsule (100 mg total) by mouth 2 (two) times daily. 10 capsule 0   donepezil (ARICEPT) 10 MG tablet TAKE 1 TABLET BY MOUTH EVERYDAY AT BEDTIME 90 tablet 1   folic acid (FOLVITE) 1 MG tablet Take 1 tablet (1 mg total) by mouth daily. 90 tablet 0   memantine (NAMENDA) 10 MG tablet TAKE 1 TABLET BY MOUTH TWICE A DAY 180 tablet 4   Multiple Vitamin (MULTIVITAMIN WITH MINERALS) TABS tablet Take 1 tablet by mouth daily. 30 tablet 1   ondansetron (ZOFRAN) 4 MG tablet Take 1 tablet (4 mg total) by mouth every 6 (six) hours as needed for nausea. 20 tablet 0   pantoprazole (PROTONIX) 40 MG tablet TAKE 1 TABLET BY MOUTH EVERY DAY 90 tablet 1   Polyethyl Glycol-Propyl Glycol (SYSTANE) 0.4-0.3 % SOLN Apply 1 drop to eye 2 (two) times daily. (Patient taking differently: Place 1 drop into both eyes 2 (two) times daily.) 30 mL 5   polyethylene glycol (MIRALAX / GLYCOLAX) 17 g packet Take 17 g by mouth daily. 14 each 0   rosuvastatin (  CRESTOR) 20 MG tablet Take 1 tablet (20 mg total) by mouth daily. 90 tablet 0   triamcinolone cream (KENALOG) 0.1 % Apply 1 Application topically 2 (two) times daily. 30 g 0   trimethoprim-polymyxin b (POLYTRIM) ophthalmic solution Apply 1-2 drops into affected eye QID x 5 days. 10 mL 0   No current facility-administered medications for this visit.    No Known Allergies  Family History  Problem Relation Age of Onset   AAA (abdominal aortic aneurysm) Neg Hx     Social History   Socioeconomic History   Marital status: Married    Spouse name: Not on file   Number of children: Not on file   Years of education: Not on file    Highest education level: Not on file  Occupational History   Not on file  Tobacco Use   Smoking status: Never   Smokeless tobacco: Never  Vaping Use   Vaping status: Never Used  Substance and Sexual Activity   Alcohol use: No   Drug use: No   Sexual activity: Not on file  Other Topics Concern   Not on file  Social History Narrative   Lives with wife, son and daughter in law + 2 children   Right Handed   Drinks no caffeine   Social Determinants of Health   Financial Resource Strain: Not on file  Food Insecurity: No Food Insecurity (06/27/2022)   Hunger Vital Sign    Worried About Running Out of Food in the Last Year: Never true    Ran Out of Food in the Last Year: Never true  Transportation Needs: No Transportation Needs (06/27/2022)   PRAPARE - Administrator, Civil Service (Medical): No    Lack of Transportation (Non-Medical): No  Physical Activity: Not on file  Stress: Not on file  Social Connections: Not on file  Intimate Partner Violence: Not At Risk (06/19/2022)   Humiliation, Afraid, Rape, and Kick questionnaire    Fear of Current or Ex-Partner: No    Emotionally Abused: No    Physically Abused: No    Sexually Abused: No     Constitutional: Denies fever, malaise, fatigue, headache or abrupt weight changes.  HEENT: Denies eye pain, eye redness, ear pain, ringing in the ears, wax buildup, runny nose, nasal congestion, bloody nose, or sore throat. Respiratory: Patient reports intermittent cough.  Denies difficulty breathing, shortness of breath, or sputum production.   Cardiovascular: Denies chest pain, chest tightness, palpitations or swelling in the hands or feet.  Gastrointestinal: Denies abdominal pain, bloating, constipation, diarrhea or blood in the stool.  GU: Patient's daughter in the home reports urinary incontinence at night.  Denies urgency, frequency, pain with urination, burning sensation, blood in urine, odor or discharge. Musculoskeletal:  Denies decrease in range of motion, difficulty with gait, muscle pain or joint pain and swelling.  Skin: Denies redness, rashes, lesions or ulcercations.  Neurological: Patient's daughter-in-law reports insomnia, difficulty with memory, difficulty with balance and coordination.  Denies difficulty with speech.  Psych: Denies anxiety, depression, SI/HI.  No other specific complaints in a complete review of systems (except as listed in HPI above).  Observations/Objective:  Wt Readings from Last 3 Encounters:  08/22/22 145 lb (65.8 kg)  06/19/22 145 lb (65.8 kg)  05/23/22 142 lb (64.4 kg)    General: Appears his stated age, chronically ill-appearing, in NAD. Pulmonary/Chest: Normal effort. No respiratory distress.  Neurological: Alert.  BMET    Component Value Date/Time   NA 138  06/22/2022 0630   K 4.1 06/22/2022 0630   CL 104 06/22/2022 0630   CO2 26 06/22/2022 0630   GLUCOSE 95 06/22/2022 0630   BUN 17 06/22/2022 0630   CREATININE 0.82 06/22/2022 0630   CREATININE 0.89 01/23/2021 1042   CALCIUM 8.1 (L) 06/22/2022 0630   GFRNONAA >60 06/22/2022 0630   GFRNONAA 83 02/21/2015 1008   GFRAA >60 10/28/2019 0905   GFRAA >89 02/21/2015 1008    Lipid Panel     Component Value Date/Time   CHOL 115 03/11/2022 0302   CHOL 107 08/27/2019 1544   TRIG 58 03/11/2022 0302   HDL 43 03/11/2022 0302   HDL 35 (L) 08/27/2019 1544   CHOLHDL 2.7 03/11/2022 0302   VLDL 12 03/11/2022 0302   LDLCALC 60 03/11/2022 0302   LDLCALC 75 01/23/2021 1042    CBC    Component Value Date/Time   WBC 9.6 06/22/2022 0630   RBC 3.02 (L) 06/22/2022 0630   HGB 8.1 (L) 06/22/2022 0630   HCT 25.0 (L) 06/22/2022 0630   PLT 208 06/22/2022 0630   MCV 82.8 06/22/2022 0630   MCH 26.8 06/22/2022 0630   MCHC 32.4 06/22/2022 0630   RDW 14.5 06/22/2022 0630   LYMPHSABS 1.6 06/18/2022 2308   MONOABS 1.1 (H) 06/18/2022 2308   EOSABS 0.0 06/18/2022 2308   BASOSABS 0.0 06/18/2022 2308    Hgb A1C Lab Results   Component Value Date   HGBA1C 5.6 03/10/2022        Assessment and Plan:   Follow Up Instructions:    I discussed the assessment and treatment plan with the patient. The patient was provided an opportunity to ask questions and all were answered. The patient agreed with the plan and demonstrated an understanding of the instructions.   The patient was advised to call back or seek an in-person evaluation if the symptoms worsen or if the condition fails to improve as anticipated.    Nicki Reaper, NP

## 2022-11-21 NOTE — Assessment & Plan Note (Signed)
Continue pantoprazole as needed

## 2022-11-21 NOTE — Assessment & Plan Note (Signed)
Continue albuterol as needed 

## 2022-11-22 NOTE — Telephone Encounter (Signed)
Requested medication (s) are due for refill today: alternative requested  Requested medication (s) are on the active medication list: yes  Last refill:  11/21/22  Future visit scheduled: yes  Notes to clinic:  Pharmacy comment: Alternative Requested:PRIOR AUTH.      Requested Prescriptions  Pending Prescriptions Disp Refills   mirabegron ER (MYRBETRIQ) 25 MG TB24 tablet [Pharmacy Med Name: MIRABEGRON ER 25 MG TABLET] 90 tablet 0    Sig: Take 1 tablet (25 mg total) by mouth daily.     Urology: Bladder Agents - mirabegron Passed - 11/21/2022 11:44 AM      Passed - Cr in normal range and within 360 days    Creat  Date Value Ref Range Status  01/23/2021 0.89 0.70 - 1.28 mg/dL Final   Creatinine, Ser  Date Value Ref Range Status  06/22/2022 0.82 0.61 - 1.24 mg/dL Final         Passed - ALT in normal range and within 360 days    ALT  Date Value Ref Range Status  06/18/2022 28 0 - 44 U/L Final         Passed - AST in normal range and within 360 days    AST  Date Value Ref Range Status  06/18/2022 28 15 - 41 U/L Final         Passed - eGFR is 15 or above and within 360 days    GFR, Est African American  Date Value Ref Range Status  02/21/2015 >89 >=60 mL/min Final   GFR calc Af Amer  Date Value Ref Range Status  10/28/2019 >60 >60 mL/min Final   GFR, Est Non African American  Date Value Ref Range Status  02/21/2015 83 >=60 mL/min Final    Comment:      The estimated GFR is a calculation valid for adults (>=75 years old) that uses the CKD-EPI algorithm to adjust for age and sex. It is   not to be used for children, pregnant women, hospitalized patients,    patients on dialysis, or with rapidly changing kidney function. According to the NKDEP, eGFR >89 is normal, 60-89 shows mild impairment, 30-59 shows moderate impairment, 15-29 shows severe impairment and <15 is ESRD.      GFR, Estimated  Date Value Ref Range Status  06/22/2022 >60 >60 mL/min Final     Comment:    (NOTE) Calculated using the CKD-EPI Creatinine Equation (2021)    GFR  Date Value Ref Range Status  06/29/2020 89.57 >60.00 mL/min Final    Comment:    Calculated using the CKD-EPI Creatinine Equation (2021)   eGFR  Date Value Ref Range Status  01/23/2021 89 > OR = 60 mL/min/1.20m2 Final    Comment:    The eGFR is based on the CKD-EPI 2021 equation. To calculate  the new eGFR from a previous Creatinine or Cystatin C result, go to https://www.kidney.org/professionals/ kdoqi/gfr%5Fcalculator          Passed - Last BP in normal range    BP Readings from Last 1 Encounters:  08/22/22 109/60         Passed - Valid encounter within last 12 months    Recent Outpatient Visits           Yesterday Nocturnal enuresis   [facility] [place], [place], NP   5 months ago Acute bacterial bronchitis   [facility] [place], [place], NP   6 months ago Aortic atherosclerosis (HCC)  [place] [facility] [place], Salv[place], NP   1 year ago Generalized weakness   [place] [place], Salvadore [place], NP   1 year ago Aortic atherosclerosis [facility])   [place] [facility] [place], Salvadore [place], [place]

## 2022-12-05 ENCOUNTER — Encounter: Payer: Self-pay | Admitting: Internal Medicine

## 2022-12-06 NOTE — Telephone Encounter (Signed)
This order for DME should be in the folder upfront.  Could you fax this as requested by the patient's daughter-in-law?

## 2022-12-26 ENCOUNTER — Other Ambulatory Visit: Payer: Self-pay | Admitting: Internal Medicine

## 2022-12-26 NOTE — Telephone Encounter (Signed)
Medication Refill - Medication: Folic acid (FOLVITE) 1 MG tablet   Has the patient contacted their pharmacy? Yes.     Preferred Pharmacy (with phone number or street name):  [facility] [phone] [place], [place] - [address] Phone: [phone]  Fax: [phone]      Has the patient been seen for an appointment in the last year OR does the patient have an upcoming appointment? Yes.    Please assist patient further as he is completely out of this medication

## 2022-12-27 MED ORDER — FOLIC ACID 1 MG PO TABS: 1.0000 mg | ORAL_TABLET | Freq: Every day | ORAL | 0 refills | Status: DC

## 2022-12-27 NOTE — Telephone Encounter (Signed)
Requested Prescriptions  Pending Prescriptions Disp Refills   folic acid (FOLVITE) 1 MG tablet 90 tablet 0    Sig: Take 1 tablet (1 mg total) by mouth daily.     Endocrinology:  Vitamins Passed - 12/26/2022  1:33 PM      Passed - Valid encounter within last 12 months    Recent Outpatient Visits           1 month ago Nocturnal enuresis   [facility] [place], [place], NP   6 months ago Acute bacterial bronchitis   [facility] [place], Salvadore [place], NP   7 months ago Aortic atherosclerosis [facility])   [place] [facility] [place], Salvadore Oxford, NP   1 year ago Generalized weakness   [facility] [facility] [place], Salvadore Oxford, NP   1 year ago Aortic atherosclerosis [facility])    [facility] [place], Salvad[place], [place]

## 2023-01-15 ENCOUNTER — Telehealth: Payer: Medicare Other | Admitting: Physician Assistant

## 2023-01-15 DIAGNOSIS — H109 Unspecified conjunctivitis: Secondary | ICD-10-CM | POA: Diagnosis not present

## 2023-01-15 MED ORDER — MOXIFLOXACIN HCL 0.5 % OP SOLN: 1.0000 [drp] | Freq: Three times a day (TID) | OPHTHALMIC | 0 refills | Status: DC

## 2023-01-15 NOTE — Progress Notes (Signed)
E-Visit for Christopher Murphy   We are sorry that you are not feeling well.  Here is how we plan to help!  Based on what you have shared with me it looks like you have conjunctivitis.  Conjunctivitis is a common inflammatory or infectious condition of the eye that is often referred to as "pink eye".  In most cases it is contagious (viral or bacterial). However, not all conjunctivitis requires antibiotics (ex. Allergic).  We have made appropriate suggestions for you based upon your presentation.  I have prescribed Vigamox eye drops Place 1 drop into affected eye three times daily for 5 days  Pink eye can be highly contagious.  It is typically spread through direct contact with secretions, or contaminated objects or surfaces that one may have touched.  Strict handwashing is suggested with soap and water is urged.  If not available, use alcohol based had sanitizer.  Avoid unnecessary touching of the eye.  If you wear contact lenses, you will need to refrain from wearing them until you see no white discharge from the eye for at least 24 hours after being on medication.  You should see symptom improvement in 1-2 days after starting the medication regimen.  Call us if symptoms are not improved in 1-2 days.  Home Care: Wash your hands often! Do not wear your contacts until you complete your treatment plan. Avoid sharing towels, bed linen, personal items with a person who has pink eye. See attention for anyone in your home with similar symptoms.  Get Help Right Away If: Your symptoms do not improve. You develop blurred or loss of vision. Your symptoms worsen (increased discharge, pain or redness)   Thank you for choosing an e-visit.  Your e-visit answers were reviewed by a board certified advanced clinical practitioner to complete your personal care plan. Depending upon the condition, your plan could have included both over the counter or prescription medications.  Please review your pharmacy choice. Make  sure the pharmacy is open so you can pick up prescription now. If there is a problem, you may contact your provider through [employer] and have the prescription routed to another pharmacy.  Your safety is important to [place]. If you have drug allergies check your prescription carefully.   For the next 24 hours you can use MyChart to ask questions about today's visit, request a non-urgent call back, or ask for a work or school excuse. You will get an email in the next two days asking about your experience. I hope that your e-visit has been valuable and will speed your recovery.   I have spent 5 minutes in review of e-visit questionnaire, review and updating patient chart, medical decision making and response to patient.   Margaretann Loveless, PA-C

## 2023-04-29 ENCOUNTER — Telehealth: Payer: Self-pay | Admitting: Gastroenterology

## 2023-04-29 NOTE — Telephone Encounter (Signed)
The patient daughter in law Chiropodist) called in to schedule him an office visit.

## 2023-05-01 ENCOUNTER — Telehealth: Payer: Medicare Other | Admitting: Internal Medicine

## 2023-05-01 ENCOUNTER — Encounter: Payer: Self-pay | Admitting: Internal Medicine

## 2023-05-01 DIAGNOSIS — I700 Atherosclerosis of aorta: Secondary | ICD-10-CM | POA: Diagnosis not present

## 2023-05-01 DIAGNOSIS — D508 Other iron deficiency anemias: Secondary | ICD-10-CM

## 2023-05-01 DIAGNOSIS — K219 Gastro-esophageal reflux disease without esophagitis: Secondary | ICD-10-CM | POA: Diagnosis not present

## 2023-05-01 DIAGNOSIS — I69351 Hemiplegia and hemiparesis following cerebral infarction affecting right dominant side: Secondary | ICD-10-CM

## 2023-05-01 DIAGNOSIS — R053 Chronic cough: Secondary | ICD-10-CM

## 2023-05-01 DIAGNOSIS — K644 Residual hemorrhoidal skin tags: Secondary | ICD-10-CM | POA: Diagnosis not present

## 2023-05-01 DIAGNOSIS — I7121 Aneurysm of the ascending aorta, without rupture: Secondary | ICD-10-CM

## 2023-05-01 DIAGNOSIS — F01B0 Vascular dementia, moderate, without behavioral disturbance, psychotic disturbance, mood disturbance, and anxiety: Secondary | ICD-10-CM

## 2023-05-01 DIAGNOSIS — Z8673 Personal history of transient ischemic attack (TIA), and cerebral infarction without residual deficits: Secondary | ICD-10-CM

## 2023-05-01 NOTE — Progress Notes (Signed)
Virtual Visit via Video Note  I connected with Christen Butter on 05/01/23 at 11:40 AM EST by a video enabled telemedicine application and verified that I am speaking with the correct person using two identifiers.  Location: Patient: Home Provider: Office  Persons participating in this video call: Nicki Reaper, NP, [name] and daughter in law   I discussed the limitations of evaluation and management by telemedicine and the availability of in person appointments. The patient expressed understanding and agreed to proceed.  History of Present Illness:  Patient due for follow-up of chronic conditions.  HLD with aortic atherosclerosis, history of TIA: Mainly cognitive.  His last LDL was 60, triglycerides 58, 03/2022.  He denies myalgias on rosuvastatin.  He is taking aspirin as well.  He follows with neurology.  Thoracic aortic aneurysm: CT of chest from 04/2020 reviewed.  He does not follow with vascular.  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  He is taking donepezil and memantine as prescribed.  He follows with neurology.  Chronic cough: Intermittent.  Managed with albuterol as needed.  There are no PFTs on file.  Anemia: His last H/H was 8.1/25, 06/2022.  He is not taking oral iron at this time but is taking folic acid.  He does not follow with hematology.  GERD: He is not sure what triggers this. He takes pantoprazole as needed. There is no upper GI on file.    Past Medical History:  Diagnosis Date   Blood clots in brain 2009   Stroke [facility])     Current Outpatient Medications  Medication Sig Dispense Refill   albuterol (VENTOLIN HFA) 108 (90 Base) MCG/ACT inhaler Inhale 2 puffs into the lungs every 6 (six) hours as needed for wheezing or shortness of breath. 8 g 0   aspirin EC (ASPIRIN LOW DOSE) 81 MG tablet Take 1 tablet (81 mg total) by mouth daily. Swallow whole. 30 tablet 12   benzonatate (TESSALON PERLES) 100 MG capsule Take 1 capsule (100 mg total) by mouth 3  (three) times daily as needed. 20 capsule 0   docusate sodium (COLACE) 100 MG capsule Take 1 capsule (100 mg total) by mouth 2 (two) times daily. 10 capsule 0   donepezil (ARICEPT) 10 MG tablet TAKE 1 TABLET BY MOUTH EVERYDAY AT BEDTIME 90 tablet 1   folic acid (FOLVITE) 1 MG tablet Take 1 tablet (1 mg total) by mouth daily. 90 tablet 0   memantine (NAMENDA) 10 MG tablet TAKE 1 TABLET BY MOUTH TWICE A DAY 180 tablet 4   moxifloxacin (VIGAMOX) 0.5 % ophthalmic solution Place 1 drop into the left eye 3 (three) times daily. For 5 days 3 mL 0   Multiple Vitamin (MULTIVITAMIN WITH MINERALS) TABS tablet Take 1 tablet by mouth daily. 30 tablet 1   ondansetron (ZOFRAN) 4 MG tablet Take 1 tablet (4 mg total) by mouth every 6 (six) hours as needed for nausea. 20 tablet 0   oxybutynin (DITROPAN XL) 5 MG 24 hr tablet Take 1 tablet (5 mg total) by mouth at bedtime. 90 tablet 1   pantoprazole (PROTONIX) 40 MG tablet TAKE 1 TABLET BY MOUTH EVERY DAY 90 tablet 1   Polyethyl Glycol-Propyl Glycol (SYSTANE) 0.4-0.3 % SOLN Apply 1 drop to eye 2 (two) times daily. (Patient taking differently: Place 1 drop into both eyes 2 (two) times daily.) 30 mL 5   polyethylene glycol (MIRALAX / GLYCOLAX) 17 g packet Take 17 g by mouth daily. 14 each 0   rosuvastatin (CRESTOR) 20  MG tablet Take 1 tablet (20 mg total) by mouth daily. 90 tablet 0   traZODone (DESYREL) 50 MG tablet Take 0.5-1 tablets (25-50 mg total) by mouth at bedtime as needed for sleep. 90 tablet 1   triamcinolone cream (KENALOG) 0.1 % Apply 1 Application topically 2 (two) times daily. 30 g 0   No current facility-administered medications for this visit.    No Known Allergies  Family History  Problem Relation Age of Onset   AAA (abdominal aortic aneurysm) Neg Hx     Social History   Socioeconomic History   Marital status: Married    Spouse name: Not on file   Number of children: Not on file   Years of education: Not on file   Highest education level:  Not on file  Occupational History   Not on file  Tobacco Use   Smoking status: Never   Smokeless tobacco: Never  Vaping Use   Vaping status: Never Used  Substance and Sexual Activity   Alcohol use: No   Drug use: No   Sexual activity: Not on file  Other Topics Concern   Not on file  Social History Narrative   Lives with wife, son and daughter in law + 2 children   Right Handed   Drinks no caffeine   Social Drivers of Corporate investment banker Strain: Not on file  Food Insecurity: No Food Insecurity (06/27/2022)   Hunger Vital Sign    Worried About Running Out of Food in the Last Year: Never true    Ran Out of Food in the Last Year: Never true  Transportation Needs: No Transportation Needs (06/27/2022)   PRAPARE - Administrator, Civil Service (Medical): No    Lack of Transportation (Non-Medical): No  Physical Activity: Not on file  Stress: Not on file  Social Connections: Not on file  Intimate Partner Violence: Not At Risk (06/19/2022)   Humiliation, Afraid, Rape, and Kick questionnaire    Fear of Current or Ex-Partner: No    Emotionally Abused: No    Physically Abused: No    Sexually Abused: No     Constitutional: Denies fever, malaise, fatigue, headache or abrupt weight changes.  HEENT: Denies eye pain, eye redness, ear pain, ringing in the ears, wax buildup, runny nose, nasal congestion, bloody nose, or sore throat. Respiratory: Patient reports intermittent cough.  Denies difficulty breathing, shortness of breath, or sputum production.   Cardiovascular: Denies chest pain, chest tightness, palpitations or swelling in the hands or feet.  Gastrointestinal: Patient's daughter-in-law reports external hemorrhoid.  Denies abdominal pain, bloating, constipation, diarrhea or blood in the stool.  GU: Patient's daughter in law reports urinary incontinence at night.  Denies urgency, frequency, pain with urination, burning sensation, blood in urine, odor or  discharge. Musculoskeletal: Denies decrease in range of motion, difficulty with gait, muscle pain or joint pain and swelling.  Skin: Denies redness, rashes, lesions or ulcercations.  Neurological: Patient's daughter-in-law reports insomnia, difficulty with memory, difficulty with balance and coordination.  Denies difficulty with speech.  Psych: Denies anxiety, depression, SI/HI.  No other specific complaints in a complete review of systems (except as listed in HPI above).  Observations/Objective:  Wt Readings from Last 3 Encounters:  08/22/22 145 lb (65.8 kg)  06/19/22 145 lb (65.8 kg)  05/23/22 142 lb (64.4 kg)    General: Appears his stated age, chronically ill-appearing, in NAD. Pulmonary/Chest: Normal effort. No respiratory distress.  Neurological: Alert.  BMET  Component Value Date/Time   NA 138 06/22/2022 0630   K 4.1 06/22/2022 0630   CL 104 06/22/2022 0630   CO2 26 06/22/2022 0630   GLUCOSE 95 06/22/2022 0630   BUN 17 06/22/2022 0630   CREATININE 0.82 06/22/2022 0630   CREATININE 0.89 01/23/2021 1042   CALCIUM 8.1 (L) 06/22/2022 0630   GFRNONAA >60 06/22/2022 0630   GFRNONAA 83 02/21/2015 1008   GFRAA >60 10/28/2019 0905   GFRAA >89 02/21/2015 1008    Lipid Panel     Component Value Date/Time   CHOL 115 03/11/2022 0302   CHOL 107 08/27/2019 1544   TRIG 58 03/11/2022 0302   HDL 43 03/11/2022 0302   HDL 35 (L) 08/27/2019 1544   CHOLHDL 2.7 03/11/2022 0302   VLDL 12 03/11/2022 0302   LDLCALC 60 03/11/2022 0302   LDLCALC 75 01/23/2021 1042    CBC    Component Value Date/Time   WBC 9.6 06/22/2022 0630   RBC 3.02 (L) 06/22/2022 0630   HGB 8.1 (L) 06/22/2022 0630   HCT 25.0 (L) 06/22/2022 0630   PLT 208 06/22/2022 0630   MCV 82.8 06/22/2022 0630   MCH 26.8 06/22/2022 0630   MCHC 32.4 06/22/2022 0630   RDW 14.5 06/22/2022 0630   LYMPHSABS 1.6 06/18/2022 2308   MONOABS 1.1 (H) 06/18/2022 2308   EOSABS 0.0 06/18/2022 2308   BASOSABS 0.0 06/18/2022  2308    Hgb A1C Lab Results  Component Value Date   HGBA1C 5.6 03/10/2022        Assessment and Plan:  External hemorrhoid:  Referral to GI for further evaluation and treatment  RTC in 6 months for follow-up of chronic conditions. Follow Up Instructions:    I discussed the assessment and treatment plan with the patient. The patient was provided an opportunity to ask questions and all were answered. The patient agreed with the plan and demonstrated an understanding of the instructions.   The patient was advised to call back or seek an in-person evaluation if the symptoms worsen or if the condition fails to improve as anticipated.    Nicki Reaper, NP

## 2023-05-01 NOTE — Assessment & Plan Note (Signed)
Continue rosuvastatin and aspirin °

## 2023-05-01 NOTE — Assessment & Plan Note (Signed)
Continue folic acid Declines referral to hematology for IV infusions at this time

## 2023-05-01 NOTE — Assessment & Plan Note (Signed)
Continue albuterol as needed 

## 2023-05-01 NOTE — Patient Instructions (Signed)
Hemorrhoids Hemorrhoids are swollen veins in and around the rectum or the opening of the butt (anus). There are two types of hemorrhoids: Internal. These occur in the veins just inside the rectum. They may poke through to the outside and become irritated and painful. External. These occur in the veins outside the anus. They can be felt as a painful swelling or hard lump near the anus. Most hemorrhoids do not cause severe problems. Often, they can be treated at home with diet and lifestyle changes. If home treatments do not help, you may need a procedure to shrink or remove the hemorrhoids. What are the causes? Hemorrhoids are caused by pressure near the anus. This pressure may be caused by: Constipation or diarrhea. Straining to poop. Pregnancy. Obesity. Sitting or riding a bike for a long time. Heavy lifting or other things that cause you to strain. Anal sex. What are the signs or symptoms? Symptoms of this condition include: Pain. Anal itching or irritation. Bleeding from the rectum. Leakage of poop (stool). Swelling of the anus. One or more lumps around the anus. How is this diagnosed? Hemorrhoids can often be diagnosed through a visual exam. Other exams or tests may also be done, such as: A digital rectal exam. This is when your health care provider feels inside your rectum with a gloved finger. Anoscope. This is an exam of the anus using a small tube. A blood test, if you have lost a lot of blood. A sigmoidoscopy or colonoscopy. These are tests to look inside the colon using a tube with a camera on the end. How is this treated? In most cases, hemorrhoids can be treated at home with diet and lifestyle changes. If these changes do not help, you may need to have a procedure done. These procedures can make the hemorrhoids smaller or fully remove them. Common procedures include: Rubber band ligation. Rubber bands are placed at the base of the hemorrhoids to cut off their blood  supply. Sclerotherapy. Medicine is put into the hemorrhoids to shrink them. Infrared coagulation. A type of light energy is used to get rid of the hemorrhoids. Hemorrhoidectomy surgery. The hemorrhoids are removed during surgery. Then, the veins that supply them are tied off. Stapled hemorrhoidopexy surgery. The base of the hemorrhoid is stapled to the wall of the rectum. Follow these instructions at home: Medicines Take over-the-counter and prescription medicines only as told by your provider. Use medicated creams or medicines that are put in the rectum (suppositories) as told by your provider. Eating and drinking  Eat foods that are high in fiber, such as beans, whole grains, and fresh fruits and vegetables. Ask your provider about taking products that have fiber added to them (fiber supplements). Reduce the amount of fat in your diet. You can do this by eating low-fat dairy products, eating less red meat, and avoiding processed foods. Drink enough fluid to keep your pee (urine) pale yellow. Managing pain and swelling  Take warm sitz baths for 20 minutes, 3-4 times a day. This can help ease pain and discomfort. You may do this in a bathtub or you can use a portable sitz bath that fits over the toilet. If told, put ice on the affected area. It may help to use ice packs between sitz baths. Put ice in a plastic bag. Place a towel between your skin and the bag. Leave the ice on for 20 minutes, 2-3 times a day. If your skin turns bright red, remove the ice right away to prevent   skin damage. The risk of damage is higher if you cannot feel pain, heat, or cold. General instructions Exercise. Ask your provider how much and what kind of exercise is best for you. In general, you should do moderate exercise for at least 30 minutes on most days of the week (150 minutes each week). You may want to try walking, biking, or yoga. Go to the bathroom when you have the urge to poop. Do not wait. Avoid  straining to poop. Keep the anus dry and clean. Use wet toilet paper or moist towelettes after you poop. Do not sit on the toilet for a long time. This can increase blood pooling and pain. Where to find more information National Institute of Diabetes and Digestive and Kidney Diseases: niddk.nih.gov Contact a health care provider if: You have more pain and swelling that do not get better with treatment. You have trouble pooping or you are not able to poop. You have pain or inflammation outside the area of the hemorrhoids. Get help right away if: You are bleeding from your rectum and you cannot get it to stop. This information is not intended to replace advice given to you by your health care provider. Make sure you discuss any questions you have with your health care provider. Document Revised: 12/06/2021 Document Reviewed: 12/06/2021 Elsevier Patient Education  2024 Elsevier Inc.  

## 2023-05-01 NOTE — Assessment & Plan Note (Signed)
No recent follow-up on file

## 2023-05-01 NOTE — Assessment & Plan Note (Signed)
 Continue pantoprazole as needed

## 2023-05-01 NOTE — Assessment & Plan Note (Signed)
Continue atorvastatin and aspirin Encouraged low-fat diet

## 2023-05-01 NOTE — Assessment & Plan Note (Signed)
Encouraged regular exercise for strengthening

## 2023-05-01 NOTE — Assessment & Plan Note (Signed)
Continue memantine and donepezil as prescribed by neurology

## 2023-05-09 ENCOUNTER — Other Ambulatory Visit: Payer: Self-pay | Admitting: Internal Medicine

## 2023-05-10 NOTE — Telephone Encounter (Signed)
Requested medication (s) are due for refill today - yes  Requested medication (s) are on the active medication list -yes  Future visit scheduled -no  Last refill: 08/29/22 30g  Notes to clinic: non delegated Rx  Requested Prescriptions  Pending Prescriptions Disp Refills   triamcinolone cream (KENALOG) 0.1 % [Pharmacy Med Name: TRIAMCINOLONE 0.1% CREAM] 30 g 0    Sig: APPLY TO AFFECTED AREA TWICE A DAY     Not Delegated - Dermatology:  Corticosteroids Failed - 05/10/2023  8:49 AM      Failed - This refill cannot be delegated      Passed - Valid encounter within last 12 months    Recent Outpatient Visits           1 week ago External hemorrhoid   [facility] [place], Salvadore Oxford, NP   5 months ago Nocturnal enuresis   [place] [facility] [place], Salvadore Oxford, NP   11 months ago Acute bacterial bronchitis   [facility] [place], Salvadore Oxford, NP   11 months ago Aortic atherosclerosis [facility])   [place] [facility] [place], Salvadore Oxford, NP   1 year ago Generalized weakness   [place] [facility] [place], Salvadore Oxford, NP                 Requested Prescriptions  Pending Prescriptions Disp Refills   triamcinolone cream (KENALOG) 0.1 % [Pharmacy Med Name: TRIAMCINOLONE 0.1% CREAM] 30 g 0    Sig: APPLY TO AFFECTED AREA TWICE A DAY     Not Delegated - Dermatology:  Corticosteroids Failed - 05/10/2023  8:49 AM      Failed - This refill cannot be delegated      Passed - Valid encounter within last 12 months    Recent Outpatient Visits           1 week ago External hemorrhoid   [place] [facility] [place], Salvadore Oxford, NP   5 months ago Nocturnal enuresis   [place] [facility] [place], Salvadore Oxford, NP   11 months ago Acute bacterial bronchitis    [facility], Salvadore Oxford, NP   11 months ago Aortic atherosclerosis [facility])   [facility] [place], Salvadore Oxford, NP   1 year ago Generalized weakness   [facility] [place], Salvadore Oxford, [place]

## 2023-06-13 ENCOUNTER — Encounter: Payer: Self-pay | Admitting: Gastroenterology

## 2023-06-13 ENCOUNTER — Ambulatory Visit: Payer: Medicare Other | Admitting: Gastroenterology

## 2023-06-13 VITALS — BP 124/69 | HR 79 | Temp 98.0°F

## 2023-06-13 DIAGNOSIS — K649 Unspecified hemorrhoids: Secondary | ICD-10-CM | POA: Diagnosis not present

## 2023-06-13 DIAGNOSIS — K648 Other hemorrhoids: Secondary | ICD-10-CM

## 2023-06-13 NOTE — Patient Instructions (Addendum)
 ?????? ?????? ??????? ????? ?????? ?? ?? ?? ?????? ??:  ??? (????) ????? ????? ??? ??????? ?????? ???? ????? ???  ??? (????) ?? ????? ?? ???-?????? ????? ??? ????? ?????? ???? ????? ??? ???????? ?????? ???? ?????? ????????? ???? ???? ????? ?? ???? ?????? ???? ????? ??? ?????? ???-??? ???? ????? ??? ??? ?? ????? ???  ???? ?? ???  ??-???? ???? ???? ?????? (????) ??? ???? ???? ??-???? (???) ?????  ??-???? ???? ???? ???? ?????? ???? ?????  ??? ??????  ???? ?????? ??? ???? (??????)?  ???? ?????? ??????  ???? ???? ??? ????? ???????  ???? ?????? ??? ??? ?????? ??????? ???? ???? ?????? ????? ???? ????? ???  ???? ????? ?? ????? ??? ???? ???  ????  ??? ???? ????? ??? ????  ??? ??? ??? ?????  ??-???? ?? ??? ?????  ????  ?????? ??? ?? ??????? ?? ???-????? ??? ??? ??? ??????  ???? ???? ????? ???? ????? ??? ???????? ??????? ????, ?????? ?? ???? ?? ???? ???? ?? ???? ??? ??????? ???? ?? ???? ??:  ???? ???-??? ?? ???? ??? ?????   ???? ???? ????? ???? ????? ????  ???? ?? ???? ??? ???? ????, ??? ??????? ??? ???????? ?????? ?? ??? ?? ???? ??? ?????? ????? ??? ?? ??? ?? ??? ?? ???? ??:  ?????? ?? ????? ??? ???? ???? ??? ?? ?? ???? ????   ?????? ??? ?????? ?? ???? ????  ?????? ??? ????? ?? ??? ???? ?? ?????? ????  ?????? ??? ??????? ???? ?? ????? ????  ?? ???? ????? ??????? ?? ????? ???: ??????  ???? ????? ????? ???? ?????? ?? ???-??-?????? ??? ?????? ?????? ?????? ???  ?????? ???? ???? ?????? ?????? ??? ?? ?????? ?? ????? ???? ??? ???? ?????? ??, ???? ????? ????? ???? ?????? ?????  ????-???? Bav?s?ra bav?s?ra suj?'?? h?'?'?? n???'?? hana j? ba?a sakad?'?? hana:  Ba?a (gud?) vica. Ihan?? n? adar?n? bav?s?ra kih? j?nd? hai.  Ba?a (gud?) d? khula?a d? ?l?-du'?l?. Ihan?? n? b?har? bav?s?ra kih? j?nd? hai. Zi'?d?tara bav?s?ra bahuta bur?'?? samasi'?v?? paid? nah?? karad?Luberta Mutter tuh??? j?vana ?ail? at? tuh??? kh??a-p??a vica badal?'a n?la ?h?ka h? j?nd? hana.  K?rana k? hana?   Mala-m?tara ka?ha?a vica mu?akala (kabaza) j?? p??? varag? mala-m?tara (dasata) h???.  Mala-m?tara karad? sam?? bahuta zi'?d? dhak? d???.  Garabha avasath?[place] Challenger zi'?d? bh?ra h??? (m???p?).  Bahuta zi'?d? bai?ha??.  Lab? sam?? taka s?'?kala cal?'u??.  Bh?r? cuka?? j?? h?ra c?z?? jinh?? vica bahuta zi'?d? mihanata karan? paind? hai.  Gud? saikasa. K? sak?ta j?? lacha?a hana?  Darada.  Ba?a vica khujal? j?? darada.  Ba?a t?? kh?na vaga??.  Mala-m?tara d? l?ka h???.  S?ja.  Tuh??? ba?a d? khul'ha?a d? ?l?-du'?l? ika j?? vadha ga?h??.  Isad? il?ja kiv?? k?t? j?nd? hai? Zi'?d?tara m?mali'?? vica, bav?s?ra d? il?ja ghara vica k?t? j? sakad? hai. Tuh?n? kih? j? sakad? hai:  ?pa?? kh??a-p??a d? c?za n? badal?.   ?pa?? j?vana ?ail? vica badal?'a kar?Marland Kitchen  J?kara iha il?ja madada nah?? karad?, t?? tuh?n? ika prakiri'? karav?'u?a d? l??a h? sakad? hai. Tuh??? ??ka?ara n? iha karana d? l??a h? sakad? hai:  Bav?s?ra d? h??h?? raba?a bai??a lag?'? t?? j? uha ?iga j??a.   Bav?s?ra n? suga?ana la'? dav?'? p?'?Unknown Jim?s?ra n? ?iga?a la'? ika kisama d? rau?an? p?'?Unknown Jim?s?ra t?? chu?ak?r? p?'u?a la'? sarajar? kar?Rocky Morel vica ihan?? had?'it?? d? p?la?? kar?: Dav?'?'??  ?pa?? ??ka?ara du'?r? das? anus?ra h? ?vara-d?-k?'???ara at? nusa??? v?l?'?? dav?'?'?? la'?Marlynn Perking?? vica dav?'? v?l?'?? kar?m?? j?? uha dav?'?'?? j? tus?? ?pa?? ba?a vica p?'und?  h?, ?pa?? ??ka?ara du'?r? das? anus?ra varat?.  Kh???-p??? ?????? ???? ???? ???? ????? ????? ??? ?????? ???? ???? ????, ?????, ???????, ?? ??? ??????? ????? ???  ???? ????? ??? ?????? ??????? ??? ??? ???? ????? ??????? ???? ????? ????? ????? ?? (????? ????????)?  ??? ???? ??? ????? ?? ?? ?????? ?? ???? ??: o ??? ???? ???? ????? ????? ?????  o ??? ??? ??? ?????  o ???????? ?????? ??? ??? ??????  ???? ?????? (??????) ??? ???? ???? ???? ?? ????? ??? ????? ????  ??? ??? ??? ?? ???????   ??? ??? ??? ??? ?? 20 ?????? ?? ??? ???? ?? ?????? (?????? ???)  ??? ?? ??? ???? 3-4 ??? ???? ????? ?? ????? ???? ?? ???? ??? ????? ??? ??????? ????? ??? ?? ????? ?? ?? ???? ?? ?? ????? ?? ???? ???? ????? ???  ???? ???? ????, ??? ?????? ??? '?? ???? ???? ?? ?????? ??? ?????? ?? ?????? ???? ?? ????? ??? ???? ??? ?? ???? ??? o ??????? ??? ???? ???? ???? o ???? ???? ??? ??? ?? ?????? ??? ????? ????? o ???? ??? 20 ?????? ??, ??? ???? 2-3 ??? ?????   ???? ?????? ???? ?????? ??? ?? ????? ??, ??? ???? ?? ?????? ??? ???? ?? ????? ???? ???? ???? ???? ??????? ???, ???? ??? ??? ?????? ???? ????? ??? ?????? ?? ????? ??? ????? ???  ?? ???????  ???? ???? ???? ????? ??? ????? ?? ?????? ?? ????? ??? ??? ?????? ?? ???? ?? ??? ???? ???   ???? ??????? ??-???? ??? ?? ??? ???? ??? ?????? ???? ???? ?? ????  ??-???? ???? ???? ???? ?????? ???? ?? ????? ?? ??????? ????  ???? ??? ??? ????? ??? ???? ????? ??-???? ??? ??? ???? ????? ????? ???? ??? ????? ?????? ?? ????? ????  ???? ???? ??? ????? '?? ?? ?????  ????? ??? ????? ??? ????:  ??????? ??? ??? ??? ?? ?? ???? ??? ??? ???? ??????  ??????? ??-???? ??? ???? ?????? ????? ???  ????? ??-???? ???? ?? ?????  ??????? ?????? ?? ???? ??? ???? ??? ??? ??? ???  ????? ??? ?????? ??? ????:  ?????? ??? ??? ??? ?? ???? ?? ?? ??? ???? ??????? ?? ??????? ?????? ???? ????? ??????? ????? ????? ?? ???? ??? ???? ?? ???? ??? ????? ???? ?? ????? ???? ???? ????? ??????? ??? ???? ???? ?? ???? '?? ???? ???? ??? ???????? ????? ???: 12/06/2021 ???????? ?????? ???? ??: 12/06/2021 ???????? ????? ??????  2024 ???????? ???.  Tuh??? ba?a t?? kh?na vaga rih? hai j? bada nah?? h?v?g?. Iha j??ak?r? tuh??? sihata sabh?la prad?t? du'?r? dit? ga'? sal?ha n? badala?a la'? nah?? hai. Yak?n? ba??'? ki tus?? ?pa?? sihata sabh?la prad?t? n?la ?pa?? kis? v? sav?la't? carac? karad? h?. Dasat?v?za s?dhi'? gi'?: 12/06/2021 Dasat?v?za sam?khi'? k?t? ga'?: 12/06/2021 Ailas?v?'ara mar?za sikhi'?  2024 ailas?v?'ara ika.  Document Revised: 12/06/2021 Document Reviewed:  12/06/2021 Elsevier Patient Education  2024 [employer].

## 2023-06-13 NOTE — Progress Notes (Signed)
 Wyline Mood MD, MRCP(U.K) [address]  [address]  [place], [place] [address]  Main: [phone]  Fax: [phone]   Gastroenterology Consultation  Referring Provider:     Lorre Munroe, NP Primary Care Physician:  Lorre Munroe, NP Primary Gastroenterologist:  Dr. Wyline Mood  Reason for Consultation: Hemorrhoids        HPI:   Christopher Murphy is a 78 y.o. y/o male referred for consultation & management  by  Lorre Munroe, NP.    He has been referred for hemorrhoids.  He was last seen at our office back in 2021 for constipation.  He has had a history of possible hemorrhoid surgery 25 years back in [place].  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  The patient is here with his family as well as interpreter.  Limited history from the patient he does have issues with memory and communication.  Apparently a few weeks back he was complaining of perianal discomfort leakage.  Family says that the external aspect of his anus appeared inflamed and that is when the appointment was made to use Preparation H and the symptoms resolved but they decided to come for the appointment.  Denies any bleeding he suffers from significant constipation may not have a bowel movement for a week on stool softeners Metamucil and MiraLAX once a day.  No other complaint  Past Medical History:  Diagnosis Date   Blood clots in brain 2009   Stroke [facility])     Past Surgical History:  Procedure Laterality Date   COLONOSCOPY WITH PROPOFOL N/A 04/30/2019   Procedure: COLONOSCOPY WITH PROPOFOL;  Surgeon: Wyline Mood, MD;  Location: [facility] ENDOSCOPY;  Service: Gastroenterology;  Laterality: N/A;   INTRAMEDULLARY (IM) NAIL INTERTROCHANTERIC Left 06/19/2022   Procedure: INTRAMEDULLARY (IM) NAIL INTERTROCHANTERIC;  Surgeon: Signa Kell, MD;  Location: [facility] ORS;  Service: Orthopedics;  Laterality: Left;   PROSTATECTOMY  1980    Prior to  Admission medications   Medication Sig Start Date End Date Taking? Authorizing Provider  albuterol (VENTOLIN HFA) 108 (90 Base) MCG/ACT inhaler Inhale 2 puffs into the lungs every 6 (six) hours as needed for wheezing or shortness of breath. 06/22/21   Margaretann Loveless, PA-C  aspirin EC (ASPIRIN LOW DOSE) 81 MG tablet Take 1 tablet (81 mg total) by mouth daily. Swallow whole. 06/23/22   Loyce Dys, MD  benzonatate (TESSALON PERLES) 100 MG capsule Take 1 capsule (100 mg total) by mouth 3 (three) times daily as needed. 05/27/22   Junie Spencer, FNP  docusate sodium (COLACE) 100 MG capsule Take 1 capsule (100 mg total) by mouth 2 (two) times daily. 06/22/22   Loyce Dys, MD  donepezil (ARICEPT) 10 MG tablet TAKE 1 TABLET BY MOUTH EVERYDAY AT BEDTIME 11/13/21   Micki Riley, MD  folic acid (FOLVITE) 1 MG tablet Take 1 tablet (1 mg total) by mouth daily. 12/27/22   Lorre Munroe, NP  memantine (NAMENDA) 10 MG tablet TAKE 1 TABLET BY MOUTH TWICE A DAY 08/22/22   Micki Riley, MD  moxifloxacin (VIGAMOX) 0.5 % ophthalmic solution Place 1 drop into the left eye 3 (three) times daily. For 5 days 01/15/23   Margaretann Loveless, PA-C  Multiple Vitamin (MULTIVITAMIN WITH MINERALS) TABS tablet Take 1 tablet by mouth daily. 06/23/22   Loyce Dys, MD  ondansetron (ZOFRAN) 4 MG tablet Take 1  tablet (4 mg total) by mouth every 6 (six) hours as needed for nausea. 06/20/22   Dedra Skeens, PA-C  oxybutynin (DITROPAN XL) 5 MG 24 hr tablet Take 1 tablet (5 mg total) by mouth at bedtime. 11/23/22   Lorre Munroe, NP  pantoprazole (PROTONIX) 40 MG tablet TAKE 1 TABLET BY MOUTH EVERY DAY 01/15/22   Lorre Munroe, NP  Polyethyl Glycol-Propyl Glycol (SYSTANE) 0.4-0.3 % SOLN Apply 1 drop to eye 2 (two) times daily. Patient taking differently: Place 1 drop into both eyes 2 (two) times daily. 06/12/16   Lorre Munroe, NP  polyethylene glycol (MIRALAX / GLYCOLAX) 17 g packet Take 17 g by mouth daily. 05/14/20    Ghimire, Werner Lean, MD  rosuvastatin (CRESTOR) 20 MG tablet Take 1 tablet (20 mg total) by mouth daily. 08/29/22   Lorre Munroe, NP  traZODone (DESYREL) 50 MG tablet Take 0.5-1 tablets (25-50 mg total) by mouth at bedtime as needed for sleep. 11/21/22   Lorre Munroe, NP  triamcinolone cream (KENALOG) 0.1 % APPLY TO AFFECTED AREA TWICE A DAY 05/10/23   Lorre Munroe, NP    Family History  Problem Relation Age of Onset   AAA (abdominal aortic aneurysm) Neg Hx      Social History   Tobacco Use   Smoking status: Never   Smokeless tobacco: Never  Vaping Use   Vaping status: Never Used  Substance Use Topics   Alcohol use: No   Drug use: No    Allergies as of 06/13/2023   (No Known Allergies)    Review of Systems:    All systems reviewed and negative except where noted in HPI.   Physical Exam:  BP 124/69   Pulse 79   Temp 98 F (36.7 C) (Oral)  No LMP for male patient. Psych:  Alert and cooperative. Normal mood and affect. General:   Alert,  Well-developed, well-nourished, pleasant and cooperative in NAD Head:  Normocephalic and atraumatic.  Neurologic:  Alert and oriented x1 Psych:  Alert and cooperative. Normal mood and affect.  Imaging Studies: No results found.  Assessment and Plan:   Christopher Murphy is a 78 y.o. y/o male has been referred for issues with hemorrhoids presently asymptomatic.  Patient is in a wheelchair has limited mobility did not wish to be examined today says he has no problems today the appointment was made when he had some perianal discomfort.  Based on the history of the family provides it appears that he may have had inflammation of his hemorrhoids precipitated by constipation.  Plan 1.  Continue Metamucil stop stool softeners increase MiraLAX to twice daily dosing if no better in a couple of days commence on Linzess samples which have been provided at 290 mcg dose.  If no better give me a call we will change medications if not doing  well continue and we will provide a prescription.  2.  Discussed about conservative management of hemorrhoids perianal toilet hygiene and method to clean after a bowel movement.  Advised the family to send me a picture of the perianal area when they clean and the next time if they are concerned.  Advised not to use Preparation H for more than 5 to 7 days at a time suggest to use sitz bath.  Discussed briefly about banding of hemorrhoids explained it may be harder to do in his case due to decreased mobility and if it were to be done it needs to be done in  the endoscopy suite where we have adequate staff and inability to get him onto the bed      Follow up in as needed  Dr Wyline Mood MD,MRCP(U.K)

## 2023-06-21 ENCOUNTER — Other Ambulatory Visit: Payer: Self-pay | Admitting: Internal Medicine

## 2023-06-21 NOTE — Telephone Encounter (Signed)
 Requested Prescriptions  Pending Prescriptions Disp Refills   traZODone (DESYREL) 50 MG tablet [Pharmacy Med Name: TRAZODONE 50 MG TABLET] 90 tablet 1    Sig: TAKE 0.5-1 TABLETS BY MOUTH AT BEDTIME AS NEEDED FOR SLEEP.     Psychiatry: Antidepressants - Serotonin Modulator Passed - 06/21/2023  1:41 PM      Passed - Valid encounter within last 6 months    Recent Outpatient Visits           1 month ago External hemorrhoid   [facility] [place], Salvadore Oxford, NP   7 months ago Nocturnal enuresis   [facility] [place], Salvadore Oxford, NP   1 year ago Acute bacterial bronchitis   [facility] [place], Salvadore Oxford, NP   1 year ago Aortic atherosclerosis [facility])   [facility] [place], Salvadore Oxford, NP   1 year ago Generalized weakness   [facility] [place], Salvadore Oxford, NP       Future Appointments             In 1 month Vanga, Loel Dubonnet, MD [facility] [place] [facility]

## 2023-06-26 ENCOUNTER — Ambulatory Visit (INDEPENDENT_AMBULATORY_CARE_PROVIDER_SITE_OTHER): Admitting: Internal Medicine

## 2023-06-26 VITALS — BP 118/64 | Ht 69.0 in | Wt 147.2 lb

## 2023-06-26 DIAGNOSIS — R600 Localized edema: Secondary | ICD-10-CM

## 2023-06-26 DIAGNOSIS — D508 Other iron deficiency anemias: Secondary | ICD-10-CM

## 2023-06-26 DIAGNOSIS — E559 Vitamin D deficiency, unspecified: Secondary | ICD-10-CM

## 2023-06-26 DIAGNOSIS — I700 Atherosclerosis of aorta: Secondary | ICD-10-CM | POA: Diagnosis not present

## 2023-06-26 DIAGNOSIS — R739 Hyperglycemia, unspecified: Secondary | ICD-10-CM

## 2023-06-26 DIAGNOSIS — L219 Seborrheic dermatitis, unspecified: Secondary | ICD-10-CM

## 2023-06-26 DIAGNOSIS — E538 Deficiency of other specified B group vitamins: Secondary | ICD-10-CM

## 2023-06-26 MED ORDER — ROSUVASTATIN CALCIUM 20 MG PO TABS: 20.0000 mg | ORAL_TABLET | Freq: Every day | ORAL | 1 refills | Status: DC

## 2023-06-26 NOTE — Patient Instructions (Signed)
Seborrheic Dermatitis, Adult Seborrheic dermatitis is a skin disease that causes red, scaly patches. It often occurs on the scalp, where it may be called dandruff. The patches may also appear on other parts of the body. Skin patches tend to occur where there are a lot of oil glands in the skin. Areas of the body that may be affected include: The scalp. The face, eyebrows, and ears. The area around a beard. Skin folds of the body. This includes the armpits, groin, and buttocks. The chest. The condition is often long-lasting (chronic). It may come and go for no known reason. It may be activated by a trigger, such as: Cold weather. Being out in the sun. Stress. Drinking alcohol. What are the causes? The cause of this condition is not known. It may be related to having too much yeast on the skin or changes in how your body's disease-fighting system (immune system) works. What increases the risk? You may be more likely to develop this condition if: You have a weak immune system. You are 78 years old or older. You have other conditions, such as: Human immunodeficiency virus (HIV) or acquired immunodeficiency virus (AIDS). Parkinson's disease. Mood disorders, such as depression. Liver problems. Obesity. What are the signs or symptoms? Symptoms of this condition include: Thick scales on the scalp. Redness on the face or in the armpits. Skin that is flaky. The flakes may be white or yellow. Skin that seems oily or dry but is not helped with moisturizers. Itching or burning in the affected areas. How is this diagnosed? This condition is diagnosed with a medical history and physical exam. A sample of your skin may be tested (skin biopsy). You may need to see a skin specialist (dermatologist). How is this treated? There is no cure for this condition, but treatment can help to manage the symptoms. You may get treatment to remove scales, lower the risk of skin infection, and reduce swelling or  itching. Treatment may include: Medicated shampoos, moisturizing creams, or ointments. Creams that reduce skin yeast. Creams that reduce swelling and irritation (steroids). Follow these instructions at home: Skin care Use any medicated shampoo, skin creams, or ointments only as told by your health care provider. Do not use skin products that contain alcohol. Take lukewarm baths or showers. Avoid very hot water. When you are outside, wear a hat and clothes that block UV light. General instructions Apply over-the-counter and prescription medicines only as told by your health care provider. Learn what triggers your symptoms so you can avoid these things. Use techniques for stress reduction, such as meditation or yoga. Do not drink alcohol if your health care provider tells you not to drink. Keep all follow-up visits. Your health care provider will check your skin to make sure the treatments are helping. Where to find more information American Academy of Dermatology: [employer]She[employer]si Contact a health care provider if: Your symptoms do not get better with treatment. Your symptoms get worse. You have new symptoms. Get help right away if: Your condition quickly gets worse, even with treatment. This information is not intended to replace advice given to you by your health care provider. Make sure you discuss any questions you have with your health care provider. Document Revised: 08/25/2021 Document Reviewed: 08/25/2021 Elsevier Patient Education  2024 [employer].

## 2023-06-26 NOTE — Progress Notes (Signed)
 Subjective:    Patient ID: Christopher Murphy, male    DOB: [birth year], 78 y.o.   MRN: [id number]  HPI  Discussed the use of AI scribe software for clinical note transcription with the patient, who gave verbal consent to proceed.   Christopher Murphy is a 78 year old male who presents with swelling in the right foot and lower ankle. He is accompanied by his family, who are concerned about the swelling.  He has been experiencing swelling in the right foot and lower ankle. The swelling is more pronounced at the end of the day. He uses compression stockings, which have been somewhat effective in reducing the swelling. Occasionally, he experiences pain in the affected area, but it is not frequent. There is no history of injury, fall, or fracture to the right foot. The swelling does not impair his ability to walk. No history of heart failure, and recent echocardiogram results from 2023 show no evidence of heart failure. No chest pain or shortness of breath.  Additionally, he has been dealing with a skin condition behind his ears, which appears to be seborrheic dermatitis. He has been using Nizoral shampoo, but it has not been effective. Selsun Blue has also been tried without success.       Review of Systems     Past Medical History:  Diagnosis Date   Blood clots in brain 2009   Stroke [facility])     Current Outpatient Medications  Medication Sig Dispense Refill   albuterol (VENTOLIN HFA) 108 (90 Base) MCG/ACT inhaler Inhale 2 puffs into the lungs every 6 (six) hours as needed for wheezing or shortness of breath. 8 g 0   aspirin EC (ASPIRIN LOW DOSE) 81 MG tablet Take 1 tablet (81 mg total) by mouth daily. Swallow whole. 30 tablet 12   benzonatate (TESSALON PERLES) 100 MG capsule Take 1 capsule (100 mg total) by mouth 3 (three) times daily as needed. 20 capsule 0   docusate sodium (COLACE) 100 MG capsule Take 1 capsule (100 mg total) by mouth 2 (two) times daily. 10 capsule 0   donepezil  (ARICEPT) 10 MG tablet TAKE 1 TABLET BY MOUTH EVERYDAY AT BEDTIME 90 tablet 1   folic acid (FOLVITE) 1 MG tablet Take 1 tablet (1 mg total) by mouth daily. 90 tablet 0   memantine (NAMENDA) 10 MG tablet TAKE 1 TABLET BY MOUTH TWICE A DAY 180 tablet 4   moxifloxacin (VIGAMOX) 0.5 % ophthalmic solution Place 1 drop into the left eye 3 (three) times daily. For 5 days 3 mL 0   Multiple Vitamin (MULTIVITAMIN WITH MINERALS) TABS tablet Take 1 tablet by mouth daily. 30 tablet 1   ondansetron (ZOFRAN) 4 MG tablet Take 1 tablet (4 mg total) by mouth every 6 (six) hours as needed for nausea. 20 tablet 0   oxybutynin (DITROPAN XL) 5 MG 24 hr tablet Take 1 tablet (5 mg total) by mouth at bedtime. 90 tablet 1   pantoprazole (PROTONIX) 40 MG tablet TAKE 1 TABLET BY MOUTH EVERY DAY 90 tablet 1   Polyethyl Glycol-Propyl Glycol (SYSTANE) 0.4-0.3 % SOLN Apply 1 drop to eye 2 (two) times daily. (Patient taking differently: Place 1 drop into both eyes 2 (two) times daily.) 30 mL 5   polyethylene glycol (MIRALAX / GLYCOLAX) 17 g packet Take 17 g by mouth daily. 14 each 0   rosuvastatin (CRESTOR) 20 MG tablet Take 1 tablet (20 mg total) by mouth daily. 90 tablet 0   traZODone (  DESYREL) 50 MG tablet TAKE 0.5-1 TABLETS BY MOUTH AT BEDTIME AS NEEDED FOR SLEEP. 90 tablet 1   triamcinolone cream (KENALOG) 0.1 % APPLY TO AFFECTED AREA TWICE A DAY 30 g 0   No current facility-administered medications for this visit.    No Known Allergies  Family History  Problem Relation Age of Onset   AAA (abdominal aortic aneurysm) Neg Hx     Social History   Socioeconomic History   Marital status: Married    Spouse name: Not on file   Number of children: Not on file   Years of education: Not on file   Highest education level: Not on file  Occupational History   Not on file  Tobacco Use   Smoking status: Never   Smokeless tobacco: Never  Vaping Use   Vaping status: Never Used  Substance and Sexual Activity   Alcohol  use: No   Drug use: No   Sexual activity: Not on file  Other Topics Concern   Not on file  Social History Narrative   Lives with wife, son and daughter in law + 2 children   Right Handed   Drinks no caffeine   Social Drivers of Corporate investment banker Strain: Not on file  Food Insecurity: No Food Insecurity (06/27/2022)   Hunger Vital Sign    Worried About Running Out of Food in the Last Year: Never true    Ran Out of Food in the Last Year: Never true  Transportation Needs: No Transportation Needs (06/27/2022)   PRAPARE - Administrator, Civil Service (Medical): No    Lack of Transportation (Non-Medical): No  Physical Activity: Not on file  Stress: Not on file  Social Connections: Not on file  Intimate Partner Violence: Not At Risk (06/19/2022)   Humiliation, Afraid, Rape, and Kick questionnaire    Fear of Current or Ex-Partner: No    Emotionally Abused: No    Physically Abused: No    Sexually Abused: No     Constitutional: Denies fever, malaise, fatigue, headache or abrupt weight changes.  HEENT: Patient reports eye discharge.  Denies eye pain, eye redness, ear pain, ringing in the ears, wax buildup, runny nose, nasal congestion, bloody nose, or sore throat. Respiratory: Patient reports intermittent cough.  Denies difficulty breathing, shortness of breath, or sputum production.   Cardiovascular: Pt reports right leg swelling. Denies chest pain, chest tightness, palpitations or swelling in the hands.  Gastrointestinal: Patient reports intermittent constipation.  Denies abdominal pain, bloating, constipation, diarrhea or blood in the stool.  GU: Denies urgency, frequency, pain with urination, burning sensation, blood in urine, odor or discharge. Musculoskeletal: Patient reports intermittent weakness, difficulty with gait.  Denies decrease in range of motion, muscle pain or joint pain and swelling.  Skin: Patient's daughter-in-law reports rash of scalp.  Denies  lesions or ulcercations.  Neurological: Patient reports difficulty with memory, difficulty with balance and coordination.Denies dizziness, difficulty with speech.  Psych: Denies anxiety, depression, SI/HI.  No other specific complaints in a complete review of systems (except as listed in HPI above).  Objective:   Physical Exam  BP 118/64 (BP Location: Left Arm, Patient Position: Sitting, Cuff Size: Normal)   Ht 5\' 9"  (1.753 m)   Wt 147 lb 3.2 oz (66.8 kg)   BMI 21.74 kg/m    Wt Readings from Last 3 Encounters:  08/22/22 145 lb (65.8 kg)  06/19/22 145 lb (65.8 kg)  05/23/22 142 lb (64.4 kg)  General: Appears his stated age, chronically ill-appearing in NAD. Skin: Warm, dry and intact.  Seborrheic dermatitis noted of scalp. Cardiovascular: Normal rate and rhythm. S1,S2 noted.  No murmur, rubs or gallops noted.  Trace RLE edema of the right foot. No carotid bruits noted.  Pedal pulses 2+ bilaterally Pulmonary/Chest: Normal effort and positive vesicular breath sounds. No respiratory distress. No wheezes, rales or ronchi noted.  Musculoskeletal: Gait slow, unsteady with use of walking stick. Neurological: Alert. Coordination normal.    BMET    Component Value Date/Time   NA 138 06/22/2022 0630   K 4.1 06/22/2022 0630   CL 104 06/22/2022 0630   CO2 26 06/22/2022 0630   GLUCOSE 95 06/22/2022 0630   BUN 17 06/22/2022 0630   CREATININE 0.82 06/22/2022 0630   CREATININE 0.89 01/23/2021 1042   CALCIUM 8.1 (L) 06/22/2022 0630   GFRNONAA >60 06/22/2022 0630   GFRNONAA 83 02/21/2015 1008   GFRAA >60 10/28/2019 0905   GFRAA >89 02/21/2015 1008    Lipid Panel     Component Value Date/Time   CHOL 115 03/11/2022 0302   CHOL 107 08/27/2019 1544   TRIG 58 03/11/2022 0302   HDL 43 03/11/2022 0302   HDL 35 (L) 08/27/2019 1544   CHOLHDL 2.7 03/11/2022 0302   VLDL 12 03/11/2022 0302   LDLCALC 60 03/11/2022 0302   LDLCALC 75 01/23/2021 1042    CBC    Component Value Date/Time    WBC 9.6 06/22/2022 0630   RBC 3.02 (L) 06/22/2022 0630   HGB 8.1 (L) 06/22/2022 0630   HCT 25.0 (L) 06/22/2022 0630   PLT 208 06/22/2022 0630   MCV 82.8 06/22/2022 0630   MCH 26.8 06/22/2022 0630   MCHC 32.4 06/22/2022 0630   RDW 14.5 06/22/2022 0630   LYMPHSABS 1.6 06/18/2022 2308   MONOABS 1.1 (H) 06/18/2022 2308   EOSABS 0.0 06/18/2022 2308   BASOSABS 0.0 06/18/2022 2308    Hgb A1C Lab Results  Component Value Date   HGBA1C 5.6 03/10/2022           Assessment & Plan:   Assessment and Plan    Right foot and ankle swelling Chronic swelling without pain or mobility impairment. No cardiac cause. Likely venous insufficiency. Diuretics not advised due to fall risk. - Advise continued use of compression stockings during the day and removal at night. - Recommend elevation of the foot to reduce swelling.  Seborrheic dermatitis Persistent dermatitis behind ears. Current treatments ineffective. Not psoriasis or eczema. Decision to try gentler shampoo. - Recommend trial of Johnson's baby shampoo for hair washing. - If no improvement, consider alternative treatments.      RTC in 4 months for follow-up of chronic conditions Nicki Reaper, NP

## 2023-06-27 ENCOUNTER — Encounter: Payer: Self-pay | Admitting: Internal Medicine

## 2023-06-27 LAB — COMPLETE METABOLIC PANEL WITH GFR
AG Ratio: 1.4 (calc) (ref 1.0–2.5)
ALT: 12 U/L (ref 9–46)
AST: 16 U/L (ref 10–35)
Albumin: 4 g/dL (ref 3.6–5.1)
Alkaline phosphatase (APISO): 69 U/L (ref 35–144)
BUN: 12 mg/dL (ref 7–25)
CO2: 31 mmol/L (ref 20–32)
Calcium: 9.2 mg/dL (ref 8.6–10.3)
Chloride: 102 mmol/L (ref 98–110)
Creat: 0.89 mg/dL (ref 0.70–1.28)
Globulin: 2.9 g/dL (ref 1.9–3.7)
Glucose, Bld: 85 mg/dL (ref 65–139)
Potassium: 4.7 mmol/L (ref 3.5–5.3)
Sodium: 138 mmol/L (ref 135–146)
Total Bilirubin: 0.3 mg/dL (ref 0.2–1.2)
Total Protein: 6.9 g/dL (ref 6.1–8.1)

## 2023-06-27 LAB — LIPID PANEL
Cholesterol: 132 mg/dL (ref <200)
HDL: 54 mg/dL (ref >=40)
LDL Cholesterol (Calc): 55 mg/dL
Non-HDL Cholesterol (Calc): 78 mg/dL (ref <130)
Total CHOL/HDL Ratio: 2.4 (calc) (ref <5.0)
Triglycerides: 148 mg/dL (ref <150)

## 2023-06-27 LAB — HEMOGLOBIN A1C
Hgb A1c MFr Bld: 5.5 %{Hb} (ref <5.7)
Mean Plasma Glucose: 111 mg/dL
eAG (mmol/L): 6.2 mmol/L

## 2023-06-27 LAB — VITAMIN D 25 HYDROXY (VIT D DEFICIENCY, FRACTURES): Vit D, 25-Hydroxy: 30 ng/mL (ref 30–100)

## 2023-06-27 LAB — CBC
HCT: 42.4 % (ref 38.5–50.0)
Hemoglobin: 13.5 g/dL (ref 13.2–17.1)
MCH: 26.4 pg — ABNORMAL LOW (ref 27.0–33.0)
MCHC: 31.8 g/dL — ABNORMAL LOW (ref 32.0–36.0)
MCV: 82.8 fL (ref 80.0–100.0)
MPV: 10.3 fL (ref 7.5–12.5)
Platelets: 246 10*3/uL (ref 140–400)
RBC: 5.12 10*6/uL (ref 4.20–5.80)
RDW: 13.5 % (ref 11.0–15.0)
WBC: 6.7 10*3/uL (ref 3.8–10.8)

## 2023-06-27 LAB — VITAMIN B12: Vitamin B-12: 367 pg/mL (ref 200–1100)

## 2023-07-14 ENCOUNTER — Telehealth: Admitting: Physician Assistant

## 2023-07-14 DIAGNOSIS — J069 Acute upper respiratory infection, unspecified: Secondary | ICD-10-CM

## 2023-07-14 NOTE — Progress Notes (Signed)
  Because your medical history and your complaint of weakness, I feel your condition warrants further evaluation and I recommend that you be seen in a face-to-face visit.   NOTE: There will be NO CHARGE for this E-Visit   If you are having a true medical emergency, please call 911.     For an urgent face to face visit, [facility] has multiple urgent care centers for your convenience.  Click the link below for the full list of locations and hours, walk-in wait times, appointment scheduling options and driving directions:  Urgent Care - [place], [place], [place], [place], [place], [place]  [place]     Your MyChart E-visit questionnaire answers were reviewed by a board certified advanced clinical practitioner to complete your personal care plan based on your specific symptoms.    Thank you for using e-Visits. I have spent 5 minutes in review of e-visit questionnaire, review and updating patient chart, medical decision making and response to patient.   Kasandra Knudsen Mayers, PA-C

## 2023-07-15 ENCOUNTER — Encounter: Payer: Self-pay | Admitting: Internal Medicine

## 2023-07-15 ENCOUNTER — Ambulatory Visit (INDEPENDENT_AMBULATORY_CARE_PROVIDER_SITE_OTHER): Admitting: Internal Medicine

## 2023-07-15 VITALS — BP 112/58 | HR 72 | Temp 98.1°F | Ht 69.0 in | Wt 147.0 lb

## 2023-07-15 DIAGNOSIS — R531 Weakness: Secondary | ICD-10-CM | POA: Diagnosis not present

## 2023-07-15 DIAGNOSIS — R051 Acute cough: Secondary | ICD-10-CM | POA: Diagnosis not present

## 2023-07-15 DIAGNOSIS — R509 Fever, unspecified: Secondary | ICD-10-CM

## 2023-07-15 DIAGNOSIS — R0989 Other specified symptoms and signs involving the circulatory and respiratory systems: Secondary | ICD-10-CM

## 2023-07-15 MED ORDER — DOXYCYCLINE HYCLATE 100 MG PO TABS: 100.0000 mg | ORAL_TABLET | Freq: Two times a day (BID) | ORAL | 0 refills | Status: DC

## 2023-07-15 MED ORDER — PREDNISONE 10 MG PO TABS: ORAL_TABLET | ORAL | 0 refills | Status: DC

## 2023-07-15 MED ORDER — FLUTICASONE PROPIONATE 50 MCG/ACT NA SUSP: 2.0000 | Freq: Every day | NASAL | 3 refills | Status: DC

## 2023-07-15 NOTE — Progress Notes (Signed)
 Subjective:    Patient ID: Christopher Murphy, male    DOB: 1946/01/05, 78 y.o.   MRN: [id number]  HPI  Discussed the use of AI scribe software for clinical note transcription with the patient, who gave verbal consent to proceed.   Christopher Murphy is a 78 year old male who presents with cough, fever, and weakness.  He has been experiencing a cough for more than a week, accompanied by a fever that began on Friday, with the highest recorded temperature being 101.13F. The fever and cough have contributed to a general feeling of low energy.  Since Friday, he has also experienced weakness, particularly affecting his right side. On Saturday, he was unable to lift his right side, and on Sunday morning, he had difficulty sitting down.  He has been experiencing congestion alongside his other symptoms. A COVID and flu test was performed at home yesterday evening, which was negative. The symptoms began on Friday, and the COVID and flu test was done two days later.     Review of Systems   Past Medical History:  Diagnosis Date   Blood clots in brain 2009   Stroke [facility])     Current Outpatient Medications  Medication Sig Dispense Refill   albuterol (VENTOLIN HFA) 108 (90 Base) MCG/ACT inhaler Inhale 2 puffs into the lungs every 6 (six) hours as needed for wheezing or shortness of breath. 8 g 0   aspirin EC (ASPIRIN LOW DOSE) 81 MG tablet Take 1 tablet (81 mg total) by mouth daily. Swallow whole. 30 tablet 12   docusate sodium (COLACE) 100 MG capsule Take 1 capsule (100 mg total) by mouth 2 (two) times daily. 10 capsule 0   donepezil (ARICEPT) 10 MG tablet TAKE 1 TABLET BY MOUTH EVERYDAY AT BEDTIME 90 tablet 1   folic acid (FOLVITE) 1 MG tablet Take 1 tablet (1 mg total) by mouth daily. 90 tablet 0   memantine (NAMENDA) 10 MG tablet TAKE 1 TABLET BY MOUTH TWICE A DAY 180 tablet 4   moxifloxacin (VIGAMOX) 0.5 % ophthalmic solution Place 1 drop into the left eye 3 (three) times daily.  For 5 days 3 mL 0   Multiple Vitamin (MULTIVITAMIN WITH MINERALS) TABS tablet Take 1 tablet by mouth daily. 30 tablet 1   ondansetron (ZOFRAN) 4 MG tablet Take 1 tablet (4 mg total) by mouth every 6 (six) hours as needed for nausea. (Patient not taking: Reported on 06/26/2023) 20 tablet 0   oxybutynin (DITROPAN XL) 5 MG 24 hr tablet Take 1 tablet (5 mg total) by mouth at bedtime. 90 tablet 1   pantoprazole (PROTONIX) 40 MG tablet TAKE 1 TABLET BY MOUTH EVERY DAY 90 tablet 1   Polyethyl Glycol-Propyl Glycol (SYSTANE) 0.4-0.3 % SOLN Apply 1 drop to eye 2 (two) times daily. (Patient taking differently: Place 1 drop into both eyes 2 (two) times daily.) 30 mL 5   polyethylene glycol (MIRALAX / GLYCOLAX) 17 g packet Take 17 g by mouth daily. 14 each 0   rosuvastatin (CRESTOR) 20 MG tablet Take 1 tablet (20 mg total) by mouth daily. 90 tablet 1   traZODone (DESYREL) 50 MG tablet TAKE 0.5-1 TABLETS BY MOUTH AT BEDTIME AS NEEDED FOR SLEEP. 90 tablet 1   triamcinolone cream (KENALOG) 0.1 % APPLY TO AFFECTED AREA TWICE A DAY 30 g 0   No current facility-administered medications for this visit.    No Known Allergies  Family History  Problem Relation Age of Onset   AAA (  abdominal aortic aneurysm) Neg Hx     Social History   Socioeconomic History   Marital status: Married    Spouse name: Not on file   Number of children: Not on file   Years of education: Not on file   Highest education level: Bachelor's degree (e.g., BA, AB, BS)  Occupational History   Not on file  Tobacco Use   Smoking status: Never   Smokeless tobacco: Never  Vaping Use   Vaping status: Never Used  Substance and Sexual Activity   Alcohol use: No   Drug use: No   Sexual activity: Not on file  Other Topics Concern   Not on file  Social History Narrative   Lives with wife, son and daughter in law + 2 children   Right Handed   Drinks no caffeine   Social Drivers of Corporate investment banker Strain: Low Risk   (06/26/2023)   Overall Financial Resource Strain (CARDIA)    Difficulty of Paying Living Expenses: Not hard at all  Food Insecurity: No Food Insecurity (06/26/2023)   Hunger Vital Sign    Worried About Running Out of Food in the Last Year: Never true    Ran Out of Food in the Last Year: Never true  Transportation Needs: No Transportation Needs (06/26/2023)   PRAPARE - Administrator, Civil Service (Medical): No    Lack of Transportation (Non-Medical): No  Physical Activity: Insufficiently Active (06/26/2023)   Exercise Vital Sign    Days of Exercise per Week: 4 days    Minutes of Exercise per Session: 10 min  Stress: No Stress Concern Present (06/26/2023)   [employer] of Occupational Health - Occupational Stress Questionnaire    Feeling of Stress : Not at all  Social Connections: Moderately Integrated (06/26/2023)   Social Connection and Isolation Panel [NHANES]    Frequency of Communication with Friends and Family: Three times a week    Frequency of Social Gatherings with Friends and Family: Once a week    Attends Religious Services: 1 to 4 times per year    Active Member of [employer] or Organizations: No    Attends Banker Meetings: Not on file    Marital Status: Married  Catering manager Violence: Not At Risk (06/19/2022)   Humiliation, Afraid, Rape, and Kick questionnaire    Fear of Current or Ex-Partner: No    Emotionally Abused: No    Physically Abused: No    Sexually Abused: No     Constitutional: Patient reports fever.  Denies malaise, fatigue, headache or abrupt weight changes.  HEENT: Denies eye pain, eye redness, ear pain, ringing in the ears, wax buildup, runny nose, nasal congestion, bloody nose, or sore throat. Respiratory: Patient reports cough.  Denies difficulty breathing, shortness of breath, or sputum production.   Cardiovascular: Denies chest pain, chest tightness, palpitations or swelling in the hands or feet.  Gastrointestinal: Denies  abdominal pain, bloating, constipation, diarrhea or blood in the stool.  Musculoskeletal: Patient reports generalized weakness, difficulty with gait.  Denies decrease in range of motion, muscle pain or joint pain and swelling.  Skin: Denies redness, rashes, lesions or ulcercations.  Neurological: Patient has difficulty with walking.  Denies dizziness, difficulty with speech or problems with balance and coordination.   No other specific complaints in a complete review of systems (except as listed in HPI above).      Objective:   Physical Exam BP (!) 112/58 (BP Location: Left Arm, Patient Position:  Sitting, Cuff Size: Normal)   Pulse 72   Temp 98.1 F (36.7 C)   Ht 5\' 9"  (1.753 m)   Wt 147 lb (66.7 kg)   SpO2 97%   BMI 21.71 kg/m   Wt Readings from Last 3 Encounters:  06/26/23 147 lb 3.2 oz (66.8 kg)  08/22/22 145 lb (65.8 kg)  06/19/22 145 lb (65.8 kg)    General: Appears his stated age, chronically ill-appearing, in NAD. HEENT: Head: normal shape and size;  Neck: No adenopathy noted. Cardiovascular: Normal rate and rhythm.  Pulmonary/Chest: Normal effort and positive vesicular breath sounds. No respiratory distress. No wheezes, rales or ronchi noted.  Musculoskeletal: In wheelchair today. Neurological: Alert.   BMET    Component Value Date/Time   NA 138 06/26/2023 1200   K 4.7 06/26/2023 1200   CL 102 06/26/2023 1200   CO2 31 06/26/2023 1200   GLUCOSE 85 06/26/2023 1200   BUN 12 06/26/2023 1200   CREATININE 0.89 06/26/2023 1200   CALCIUM 9.2 06/26/2023 1200   GFRNONAA >60 06/22/2022 0630   GFRNONAA 83 02/21/2015 1008   GFRAA >60 10/28/2019 0905   GFRAA >89 02/21/2015 1008    Lipid Panel     Component Value Date/Time   CHOL 132 06/26/2023 1200   CHOL 107 08/27/2019 1544   TRIG 148 06/26/2023 1200   HDL 54 06/26/2023 1200   HDL 35 (L) 08/27/2019 1544   CHOLHDL 2.4 06/26/2023 1200   VLDL 12 03/11/2022 0302   LDLCALC 55 06/26/2023 1200    CBC     Component Value Date/Time   WBC 6.7 06/26/2023 1200   RBC 5.12 06/26/2023 1200   HGB 13.5 06/26/2023 1200   HCT 42.4 06/26/2023 1200   PLT 246 06/26/2023 1200   MCV 82.8 06/26/2023 1200   MCH 26.4 (L) 06/26/2023 1200   MCHC 31.8 (L) 06/26/2023 1200   RDW 13.5 06/26/2023 1200   LYMPHSABS 1.6 06/18/2022 2308   MONOABS 1.1 (H) 06/18/2022 2308   EOSABS 0.0 06/18/2022 2308   BASOSABS 0.0 06/18/2022 2308    Hgb A1C Lab Results  Component Value Date   HGBA1C 5.5 06/26/2023            Assessment & Plan:   Assessment and Plan    Cough, congestion, fever weakness Symptoms suggest viral etiology, but high fever and duration suggest possible bacterial infection concerning for pneumonia. COVID-19 and influenza tests negative. Lungs clear, no chest x-ray needed. Informed consent for antibiotics and steroids obtained. - Prescribed doxycycline 100 mg BID for 10 days. - Initiated prednisone taper for 6 days. - Advised to monitor symptoms and report if worsening.     RTC in 3 months, follow-up chronic conditions Nicki Reaper, NP

## 2023-07-15 NOTE — Patient Instructions (Signed)

## 2023-07-31 ENCOUNTER — Telehealth: Payer: Self-pay

## 2023-07-31 NOTE — Telephone Encounter (Signed)
 Please advise Dr. Baldomero Bone if you are okay with seeing patient in the office

## 2023-07-31 NOTE — Telephone Encounter (Signed)
 I can see him since he is already scheduled  RV

## 2023-07-31 NOTE — Telephone Encounter (Addendum)
 Patient has a appointment with Dr. Baldomero Bone on 08/19/2023. Patient is Dr. Antony Baumgartner patient and was last seen by Dr. Antony Baumgartner on 06/13/2023. Lesia schedule the appointment for patient on 06/13/2023. Please advise Dr. Antony Baumgartner if you can see patient or Dr. Baldomero Bone are you okay with seeing patient?

## 2023-08-01 NOTE — Telephone Encounter (Signed)
 Please schedule a interpreter for patient

## 2023-08-19 ENCOUNTER — Ambulatory Visit: Admitting: Gastroenterology

## 2023-08-21 ENCOUNTER — Encounter: Payer: Self-pay | Admitting: Neurology

## 2023-08-21 ENCOUNTER — Ambulatory Visit (INDEPENDENT_AMBULATORY_CARE_PROVIDER_SITE_OTHER): Payer: Medicare Other | Admitting: Neurology

## 2023-08-21 VITALS — BP 120/71 | HR 74 | Ht 70.0 in

## 2023-08-21 DIAGNOSIS — Z8673 Personal history of transient ischemic attack (TIA), and cerebral infarction without residual deficits: Secondary | ICD-10-CM | POA: Diagnosis not present

## 2023-08-21 DIAGNOSIS — F0150 Vascular dementia without behavioral disturbance: Secondary | ICD-10-CM | POA: Diagnosis not present

## 2023-08-21 MED ORDER — DONEPEZIL HCL 10 MG PO TABS: 10.0000 mg | ORAL_TABLET | Freq: Every day | ORAL | 3 refills | Status: DC

## 2023-08-21 MED ORDER — MEMANTINE HCL 10 MG PO TABS: 10.0000 mg | ORAL_TABLET | Freq: Two times a day (BID) | ORAL | 4 refills | Status: DC

## 2023-08-21 NOTE — Progress Notes (Signed)
 [facility] [address] [place]. [place] [address] [phone]       OFFICE FOLLOW UP VISIT NOTE  Christopher. Christopher Murphy Date of Birth:  July 10, 1945 Medical Record Number:  [id number]   Referring MD: Helayne Lo, NP Reason for Referral: TIA HPI: Initial visit 03/11/2018: Christopher Murphy is a pleasant 78 year old [place] male who seen today for initial office consultation visit.  He is accompanied by his son and daughter-in-law who provide most of the history.  I have reviewed imaging films in PACS.  The patient has had recurrent transient episodes of speech difficulties for the last 10 years.  The episodes are stereotypical.  The patient struggles to speak at times and get words out.  At times his speech is gibberish and nonsensical.  These typically last only few minutes around 5-10 or so but he has had a couple episodes which lasted for an hour or longer.  During these prolonged episodes he is been found to be confused and disoriented.  Interestingly patient had a similar episode on 04/05/2016 when he was admitted to [facility].  He had MRI scan of the brain at that time which I personally reviewed and shows interesting diffuse hyperintensity throughout the cerebral white matter raising concern for seizures.  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  EEG was apparently not done.  Patient was not started on seizure medications.  Patient has history of left posterior cerebral artery infarct in 2009 in [place].  He was placed on a combination tablet of aspirin  and Plavix  as well as medication for lipids.  He had cognitive impairment and mild memory loss and was started on Namenda  as well.  He is presently on Namenda  5 mg twice daily.  Family states that he is cognitively stable and does have short-term memory difficulties but long-term memory is good.  He is living  at home with family.  He needs only mild help with activities of daily living.  He can ambulate independently.  He has not had any witnessed generalized tonic-clonic seizure significant head injury or loss of consciousness.  The patient actually had gone to [place] following this episode in December 2017 for nearly 3 years before he came back.  He was seen by neurologist there but details are not known.  He was never placed on seizure medications. Update 08/27/2019 : He returns for follow-up after last visit a year and half ago.  Is accompanied by his son.  The patient continues to have mild memory and cognitive difficulties which appear to be unchanged and not particularly progressive.  He remains on Namenda  5 mg twice daily which is tolerating well.  He has never tried a higher dose.  Patient is not had many episodes of transient speech difficulties since her last visit.  He did undergo MRI scan of the brain on 03/31/2018 which showed old left PCA as well as few remote age lacunar infarcts and changes of small vessel disease.  No acute abnormalities.  MRA of the brain and neck both did not show any large vessel stenosis or occlusion.  EEG on 03/25/2018 was normal.  Patient had a colonoscopy done on 04/30/2019 and had a 3 mm polyp excised.  He has been having some swallowing difficulties of late and saw gastroenterologist in [place].  Modified barium swallow was done by speech therapy there but I do not have the report but the  patient's son was informed that it was fine.  He has no new complaints. Update 11/17/2019 : He returns for follow-up after last visit 3 months ago.  Is accompanied by his daughter-in-law.  They state that he had a episode of increasing confusion and hallucination as well as delusion lasting 3 days following a bladder infection and treatment with antibiotics.  He was admitted to the hospital and UA showed mild microscopic hematuria and he had recent UTI treatment.  Chest x-ray was  unremarkable.  Chest CT was also done which showed no significant abnormalities.  MRI scan of the brain showing progressive cerebral atrophy and old left PCA infarct but no acute abnormality.  2D echo showed normal ejection fraction.  Carotid ultrasound showed no significant extracranial stenosis.  His confusion improved over 3 days and he has done very well for the last week or so now and is at home.  Is back to his baseline.  He has a renal ultrasound pending for his hematuria.  He remains on aspirin  and Plavix  which is tolerating well without any external bleeding or bruising.  Mini-Mental status exam today scored 18/30 with deficits in orientation and recall.  He was able to copy intersecting pentagons but difficulty naming animals.  He has not been tried on Aricept  so far.  He has not had recurrent TIA or stroke symptoms.  He has been taking Namenda  10 mg twice daily now for several months but family has not noticed any benefit.  Patient is currently not having any delusions, hallucinations, agitation or unsafe behavior.  He can walk independently but his gait has become more stooped but he has not had falls or injuries. Update 02/08/2020: He returns for follow-up after last visit to enough months ago.  Is accompanied by his daughter-in-law.  Patient has not had any further episodes of confusion hallucinations or delusions.  He is tolerating Aricept  10 mg daily well but feels appetite may have gone down.  He is however not lost weight.  He is cognitively unchanged and there has been no specific improvement noted.  Is tolerating both Namenda  and Aricept  fairly well.  Has had no recurrent stroke or TIA symptoms.  He remains on aspirin  and Crestor  both of which is tolerating well.  Blood pressures well controlled today it is 120/67.  He has no new complaints. Update 08/08/2020 : He returns for follow-up after last visit 6 months ago.  He is accompanied by his daughter-in-law.  Patient continues to have cognitive  impairment which appears to have worsened slightly after recent COVID infection in January 2022.Christopher Murphy  Patient had some aspiration pneumonia during that hospitalization as well.  He was quite confused and disoriented but gradually seems to have returned almost back to his baseline.  He still needs constant supervision at home.  He is lost some of independence in activities like dressing himself and cleaning himself which he was previously able to do by himself.  He can still walk independently.  He had some trouble swallowing and speech therapy has recommended that he eat soft food only.  He has had no recurrent stroke or TIA.  He remains on aspirin  which is tolerating well without side effects.  He is also tolerating Aricept  and Namenda  without side effects.  Blood pressure is well controlled and today it is 118/76. Update 08/09/2021 : She returns for follow-up after last visit a year ago.  He is accompanied by his daughter-in-law.  He continues to have significant cognitive impairment and memory difficulties.  He has good days and bad days.  There are times when he cannot recognize family members and forgets the names.  He had a trip to [place] from November last year to January this year.  He can cannot recall any details of the trip.  He continues to have occasional episodes of intermittent babbling speech and speech difficulties with the last episode being 2 months ago.  This lasted few hours and he went to sleep and was fine next day morning when he woke up.  He was diagnosed with aortic root dilatation with family obviously wants conservative follow-up.  He remains on aspirin  Perin and Plavix  and tolerating well without bruising or bleeding.  He has had no recurrent stroke or TIA symptoms.  Continues to have right-sided peripheral visual defect as well as some dragging of his right leg while walking.  His blood pressure under good control and today it is 126/74.  He is tolerating Crestor  well without muscle aches  and pains.  He remains on Aricept  and Namenda  and is tolerating both medications well without side effects.  Update 08/22/2022 : He returns for follow-up after last visit a year ago.  He is accompanied by his son.  Patient was admitted to [facility] on 03/11/2022 following what appeared to be a syncopal episode.  He was sitting on the table when he send weakness and to throw his head back and become unresponsive.  He did not answer any questions.  EMS were called and they noticed his blood pressure to be low hypoxic.  Improved slightly with oxygenation.  He gradually regained consciousness upon arrival to the hospital.  He was started on a syncopal episode.  MRI scan of the brain was obtained with showed ill-defined diffusion hyperintensity in the bifrontal subcortical white matter right greater than left close possibly T2 shine through as it is seen in bright signal on T2 images as well.  EEG showed mild diffuse encephalopathy without definite seizure activity.  CT angiogram showed chronic left PCA occlusion without any new abnormalities.  LDL cholesterol 60 mg percent.  Hemoglobin A1c was 5.6.  Patient was felt to have altered mental status secondary to hypotension, syncope gradually improved after 24 hours.  He unfortunately had a mechanical fall at home 06/18/2022 and fractured his left.  He required open reduction internal fixation on 06/19/22.  He is presently at home.  He has finished home physical and occupational therapy last week.  He is able to ambulate with a walker.  He has no further falls.  Remains on Aricept  and Namenda  which is tolerating well without side effects.  Currently tolerating well without bruising or bleeding.  His blood pressure is 109/60 today.   Update 08/21/2023 : He returns for follow-up after last visit with me a year ago.  He is accompanied by his daughter-in-law who provides most of the history.  Patient has had a gradual cognitive decline.  He needs constant 24-hour  supervision and support.  He is able to walk with a cane and has fortunately not had any more falls in the last 1 year.  He has good days and bad days at times he can carry out a conversation and speaks sentences but most of the times he communicates with just a few words.  He is still able to recognize family members and can communicate his needs.  He remains on Aricept  and Namenda  and is tolerating them well without side effects.  Recent lab work from 06/16/2023 shows hemoglobin  A1c to be 5.5 and LDL cholesterol 55 mg percent.  Vitamin D  and B12 levels were normal.  On Mini-Mental status testing today score 9/30.  ROS:   14 system review of systems is positive for passing out episode, confusion, disorientation, fall, hip fracture speech difficulties, memory loss, confusion, decreased attention, disorientation decreased appetite,and all other systems negative  PMH:  Past Medical History:  Diagnosis Date   Blood clots in brain 2009   Stroke [facility])     Social History:  Social History   Socioeconomic History   Marital status: Married    Spouse name: Not on file   Number of children: Not on file   Years of education: Not on file   Highest education level: Bachelor's degree (e.g., BA, AB, BS)  Occupational History   Not on file  Tobacco Use   Smoking status: Never   Smokeless tobacco: Never  Vaping Use   Vaping status: Never Used  Substance and Sexual Activity   Alcohol  use: No   Drug use: No   Sexual activity: Not on file  Other Topics Concern   Not on file  Social History Narrative   Lives with wife, son and daughter in law + 2 children   Right Handed   Drinks no caffeine   Social Drivers of Corporate investment banker Strain: Low Risk  (06/26/2023)   Overall Financial Resource Strain (CARDIA)    Difficulty of Paying Living Expenses: Not hard at all  Food Insecurity: No Food Insecurity (06/26/2023)   Hunger Vital Sign    Worried About Running Out of Food in the Last Year: Never  true    Ran Out of Food in the Last Year: Never true  Transportation Needs: No Transportation Needs (06/26/2023)   PRAPARE - Administrator, Civil Service (Medical): No    Lack of Transportation (Non-Medical): No  Physical Activity: Insufficiently Active (06/26/2023)   Exercise Vital Sign    Days of Exercise per Week: 4 days    Minutes of Exercise per Session: 10 min  Stress: No Stress Concern Present (06/26/2023)   [employer] of Occupational Health - Occupational Stress Questionnaire    Feeling of Stress : Not at all  Social Connections: Moderately Integrated (06/26/2023)   Social Connection and Isolation Panel [NHANES]    Frequency of Communication with Friends and Family: Three times a week    Frequency of Social Gatherings with Friends and Family: Once a week    Attends Religious Services: 1 to 4 times per year    Active Member of [employer] or Organizations: No    Attends Engineer, structural: Not on file    Marital Status: Married  Catering manager Violence: Not At Risk (06/19/2022)   Humiliation, Afraid, Rape, and Kick questionnaire    Fear of Current or Ex-Partner: No    Emotionally Abused: No    Physically Abused: No    Sexually Abused: No    Medications:   Current Outpatient Medications on File Prior to Visit  Medication Sig Dispense Refill   albuterol  (VENTOLIN  HFA) 108 (90 Base) MCG/ACT inhaler Inhale 2 puffs into the lungs every 6 (six) hours as needed for wheezing or shortness of breath. 8 g 0   aspirin  EC (ASPIRIN  LOW DOSE) 81 MG tablet Take 1 tablet (81 mg total) by mouth daily. Swallow whole. 30 tablet 12   docusate sodium  (COLACE) 100 MG capsule Take 1 capsule (100 mg total) by mouth 2 (two) times daily.  10 capsule 0   donepezil  (ARICEPT ) 10 MG tablet TAKE 1 TABLET BY MOUTH EVERYDAY AT BEDTIME 90 tablet 1   fluticasone  (FLONASE ) 50 MCG/ACT nasal spray Place 2 sprays into both nostrils daily. Use for 4-6 weeks then stop and use seasonally or as  needed. 16 g 3   folic acid  (FOLVITE ) 1 MG tablet Take 1 tablet (1 mg total) by mouth daily. 90 tablet 0   memantine  (NAMENDA ) 10 MG tablet TAKE 1 TABLET BY MOUTH TWICE A DAY 180 tablet 4   Multiple Vitamin (MULTIVITAMIN WITH MINERALS) TABS tablet Take 1 tablet by mouth daily. 30 tablet 1   pantoprazole  (PROTONIX ) 40 MG tablet TAKE 1 TABLET BY MOUTH EVERY DAY 90 tablet 1   Polyethyl Glycol-Propyl Glycol (SYSTANE) 0.4-0.3 % SOLN Apply 1 drop to eye 2 (two) times daily. (Patient taking differently: Place 1 drop into both eyes 2 (two) times daily.) 30 mL 5   polyethylene glycol (MIRALAX  / GLYCOLAX ) 17 g packet Take 17 g by mouth daily. 14 each 0   rosuvastatin  (CRESTOR ) 20 MG tablet Take 1 tablet (20 mg total) by mouth daily. 90 tablet 1   traZODone  (DESYREL ) 50 MG tablet TAKE 0.5-1 TABLETS BY MOUTH AT BEDTIME AS NEEDED FOR SLEEP. 90 tablet 1   triamcinolone  cream (KENALOG ) 0.1 % APPLY TO AFFECTED AREA TWICE A DAY 30 g 0   No current facility-administered medications on file prior to visit.    Allergies:  No Known Allergies  Physical Exam General: Frail elderly [place] S[place] male, seated, in no evident distress Head: head normocephalic and atraumatic.   Neck: supple with no carotid or supraclavicular bruits Cardiovascular: regular rate and rhythm, soft ejection systolic murmur. Musculoskeletal: no deformity Skin:  no rash/petichiae Vascular:  Normal pulses all extremities  Neurologic Exam Mental Status: Awake and fully alert. Oriented to place and time. Recent and remote memory diminished. Attention span, concentration and fund of knowledge slightly reduced.  Mini-Mental status exam 9/30.  Diminished recall    Mood and affect appropriate.     Cranial Nerves: Fundoscopic exam not done. Pupils equal, briskly reactive to light. Extraocular movements full without nystagmus mild mechanical ptosis of the left eye.. Visual fields show partial right homonymous hemianopsia l to  confrontation. Hearing intact. Facial sensation intact. Face, tongue, palate moves normally and symmetrically.  Motor: Normal bulk and tone. Normal strength in all tested extremity muscles.  Diminished fine finger movements on the right.  Orbits left over right upper extremity.  Increased tone in the right leg with stiffness and mild ankle dorsiflexor weakness. Sensory.: intact to touch , pinprick , position and vibratory sensation.  Coordination: Rapid alternating movements normal in all extremities. Finger-to-nose and heel-to-shin performed accurately bilaterally. Gait and Station:  deferred as he did not bring his walker and is in a wheelchair toiday Reflexes: 1+ and symmetric. Toes downgoing.        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  Vascular risk factors of hyperlipidemia, age and cerebrovascular disease.     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  Continue 24-hour supervision and care at home and patient encouraged to exercise and walk and  use a walker at all times and avoid falls.  He was advised to continue aspirin  alone and discontinue Plavix  for stroke prevention and maintain aggressive risk factor modification with strict control of hypertension with blood pressure goal below 130/90 and lipids with LDL cholesterol goal below 70 mg percent.  He will return for follow-up in a year or call earlier if necessary.  Greater than 50% time during this 35-minute visit was spent on counseling and coordination of care about his episode of confusion, remote TIA and mild dementia and answering questions. Ardella Beaver, MD  [facility] [address] [place], [place] [address]  Phone [phone] Fax [phone] Note: This document was prepared with digital dictation and possible smart phrase technology. Any transcriptional errors that result from this process are unintentional.

## 2023-08-21 NOTE — Patient Instructions (Signed)
 I had a long discussion the patient and his daughter-in-law regarding his mixed Alzheimer's and vascular dementia which appears to have progressed and remote stroke and recommend he continue Aricept  10 mg daily as well as Namenda  10 mg twice daily.  Continue 24-hour supervision and care at home and patient encouraged to exercise and walk and use a walker at all times and avoid falls.  He was advised to continue aspirin  alone and discontinue Plavix  for stroke prevention and maintain aggressive risk factor modification with strict control of hypertension with blood pressure goal below 130/90 and lipids with LDL cholesterol goal below 70 mg percent.  He will return for follow-up in a year or call earlier if necessary.

## 2023-08-27 ENCOUNTER — Other Ambulatory Visit: Payer: Self-pay

## 2023-08-27 ENCOUNTER — Inpatient Hospital Stay

## 2023-08-27 ENCOUNTER — Emergency Department

## 2023-08-27 ENCOUNTER — Inpatient Hospital Stay
Admission: EM | Admit: 2023-08-27 | Discharge: 2023-08-28 | DRG: 070 | Discharge status: Other Acute Inpatient Hospital | Attending: Internal Medicine | Admitting: Internal Medicine

## 2023-08-27 DIAGNOSIS — F0390 Unspecified dementia without behavioral disturbance: Secondary | ICD-10-CM | POA: Diagnosis not present

## 2023-08-27 DIAGNOSIS — G40804 Other epilepsy, intractable, without status epilepticus: Secondary | ICD-10-CM | POA: Diagnosis not present

## 2023-08-27 DIAGNOSIS — D509 Iron deficiency anemia, unspecified: Secondary | ICD-10-CM | POA: Diagnosis not present

## 2023-08-27 DIAGNOSIS — G9389 Other specified disorders of brain: Principal | ICD-10-CM | POA: Diagnosis present

## 2023-08-27 DIAGNOSIS — G309 Alzheimer's disease, unspecified: Secondary | ICD-10-CM | POA: Diagnosis present

## 2023-08-27 DIAGNOSIS — R4189 Other symptoms and signs involving cognitive functions and awareness: Secondary | ICD-10-CM | POA: Diagnosis not present

## 2023-08-27 DIAGNOSIS — F01C0 Vascular dementia, severe, without behavioral disturbance, psychotic disturbance, mood disturbance, and anxiety: Secondary | ICD-10-CM | POA: Diagnosis present

## 2023-08-27 DIAGNOSIS — Z9079 Acquired absence of other genital organ(s): Secondary | ICD-10-CM

## 2023-08-27 DIAGNOSIS — Z79899 Other long term (current) drug therapy: Secondary | ICD-10-CM | POA: Diagnosis not present

## 2023-08-27 DIAGNOSIS — F0150 Vascular dementia without behavioral disturbance: Secondary | ICD-10-CM | POA: Diagnosis present

## 2023-08-27 DIAGNOSIS — Z7982 Long term (current) use of aspirin: Secondary | ICD-10-CM | POA: Diagnosis not present

## 2023-08-27 DIAGNOSIS — Z7189 Other specified counseling: Secondary | ICD-10-CM | POA: Diagnosis not present

## 2023-08-27 DIAGNOSIS — I7774 Dissection of vertebral artery: Secondary | ICD-10-CM | POA: Diagnosis not present

## 2023-08-27 DIAGNOSIS — F02C0 Dementia in other diseases classified elsewhere, severe, without behavioral disturbance, psychotic disturbance, mood disturbance, and anxiety: Secondary | ICD-10-CM | POA: Diagnosis present

## 2023-08-27 DIAGNOSIS — I69351 Hemiplegia and hemiparesis following cerebral infarction affecting right dominant side: Secondary | ICD-10-CM | POA: Diagnosis not present

## 2023-08-27 DIAGNOSIS — E8809 Other disorders of plasma-protein metabolism, not elsewhere classified: Secondary | ICD-10-CM | POA: Diagnosis not present

## 2023-08-27 DIAGNOSIS — F03C0 Unspecified dementia, severe, without behavioral disturbance, psychotic disturbance, mood disturbance, and anxiety: Secondary | ICD-10-CM | POA: Diagnosis not present

## 2023-08-27 DIAGNOSIS — R509 Fever, unspecified: Secondary | ICD-10-CM | POA: Diagnosis present

## 2023-08-27 DIAGNOSIS — R627 Adult failure to thrive: Secondary | ICD-10-CM | POA: Diagnosis not present

## 2023-08-27 DIAGNOSIS — R4701 Aphasia: Secondary | ICD-10-CM | POA: Diagnosis not present

## 2023-08-27 DIAGNOSIS — Z66 Do not resuscitate: Secondary | ICD-10-CM | POA: Diagnosis present

## 2023-08-27 DIAGNOSIS — E43 Unspecified severe protein-calorie malnutrition: Secondary | ICD-10-CM | POA: Diagnosis not present

## 2023-08-27 DIAGNOSIS — R4182 Altered mental status, unspecified: Secondary | ICD-10-CM | POA: Diagnosis not present

## 2023-08-27 DIAGNOSIS — R1310 Dysphagia, unspecified: Secondary | ICD-10-CM | POA: Diagnosis not present

## 2023-08-27 DIAGNOSIS — R1319 Other dysphagia: Secondary | ICD-10-CM | POA: Diagnosis not present

## 2023-08-27 DIAGNOSIS — Z743 Need for continuous supervision: Secondary | ICD-10-CM | POA: Diagnosis not present

## 2023-08-27 DIAGNOSIS — I69398 Other sequelae of cerebral infarction: Secondary | ICD-10-CM | POA: Diagnosis not present

## 2023-08-27 DIAGNOSIS — R638 Other symptoms and signs concerning food and fluid intake: Secondary | ICD-10-CM | POA: Diagnosis not present

## 2023-08-27 DIAGNOSIS — Z8673 Personal history of transient ischemic attack (TIA), and cerebral infarction without residual deficits: Secondary | ICD-10-CM

## 2023-08-27 DIAGNOSIS — I10 Essential (primary) hypertension: Secondary | ICD-10-CM | POA: Diagnosis not present

## 2023-08-27 DIAGNOSIS — R569 Unspecified convulsions: Secondary | ICD-10-CM | POA: Diagnosis not present

## 2023-08-27 DIAGNOSIS — I639 Cerebral infarction, unspecified: Secondary | ICD-10-CM | POA: Diagnosis present

## 2023-08-27 DIAGNOSIS — G9341 Metabolic encephalopathy: Secondary | ICD-10-CM | POA: Diagnosis present

## 2023-08-27 DIAGNOSIS — I679 Cerebrovascular disease, unspecified: Secondary | ICD-10-CM

## 2023-08-27 DIAGNOSIS — R402431 Glasgow coma scale score 3-8, in the field [EMT or ambulance]: Secondary | ICD-10-CM | POA: Diagnosis not present

## 2023-08-27 DIAGNOSIS — E871 Hypo-osmolality and hyponatremia: Secondary | ICD-10-CM | POA: Diagnosis not present

## 2023-08-27 DIAGNOSIS — G9340 Encephalopathy, unspecified: Secondary | ICD-10-CM | POA: Diagnosis not present

## 2023-08-27 DIAGNOSIS — I6389 Other cerebral infarction: Secondary | ICD-10-CM | POA: Diagnosis not present

## 2023-08-27 DIAGNOSIS — R404 Transient alteration of awareness: Secondary | ICD-10-CM | POA: Diagnosis not present

## 2023-08-27 DIAGNOSIS — E876 Hypokalemia: Secondary | ICD-10-CM | POA: Diagnosis not present

## 2023-08-27 DIAGNOSIS — F03B0 Unspecified dementia, moderate, without behavioral disturbance, psychotic disturbance, mood disturbance, and anxiety: Secondary | ICD-10-CM | POA: Diagnosis not present

## 2023-08-27 DIAGNOSIS — Z515 Encounter for palliative care: Secondary | ICD-10-CM | POA: Diagnosis not present

## 2023-08-27 DIAGNOSIS — Z789 Other specified health status: Secondary | ICD-10-CM | POA: Diagnosis not present

## 2023-08-27 DIAGNOSIS — Z558 Other problems related to education and literacy: Secondary | ICD-10-CM | POA: Diagnosis not present

## 2023-08-27 LAB — CBC
HCT: 40.5 % (ref 39.0–52.0)
HCT: 39.2 % (ref 39.0–52.0)
Hemoglobin: 12.9 g/dL — ABNORMAL LOW (ref 13.0–17.0)
Hemoglobin: 12.9 g/dL — ABNORMAL LOW (ref 13.0–17.0)
MCH: 25.9 pg — ABNORMAL LOW (ref 26.0–34.0)
MCH: 26.3 pg (ref 26.0–34.0)
MCHC: 31.9 g/dL (ref 30.0–36.0)
MCHC: 32.9 g/dL (ref 30.0–36.0)
MCV: 81.2 fL (ref 80.0–100.0)
MCV: 80 fL (ref 80.0–100.0)
Platelets: 308 10*3/uL (ref 150–400)
Platelets: 290 10*3/uL (ref 150–400)
RBC: 4.99 MIL/uL (ref 4.22–5.81)
RBC: 4.9 MIL/uL (ref 4.22–5.81)
RDW: 14.1 % (ref 11.5–15.5)
RDW: 14.1 % (ref 11.5–15.5)
WBC: 12.4 10*3/uL — ABNORMAL HIGH (ref 4.0–10.5)
WBC: 11.1 10*3/uL — ABNORMAL HIGH (ref 4.0–10.5)
nRBC: 0 % (ref 0.0–0.2)
nRBC: 0 % (ref 0.0–0.2)

## 2023-08-27 LAB — DIFFERENTIAL
Abs Immature Granulocytes: 0.06 10*3/uL (ref 0.00–0.07)
Basophils Absolute: 0.1 10*3/uL (ref 0.0–0.1)
Basophils Relative: 0 %
Eosinophils Absolute: 0.1 10*3/uL (ref 0.0–0.5)
Eosinophils Relative: 1 %
Immature Granulocytes: 1 %
Lymphocytes Relative: 21 %
Lymphs Abs: 2.6 10*3/uL (ref 0.7–4.0)
Monocytes Absolute: 0.6 10*3/uL (ref 0.1–1.0)
Monocytes Relative: 5 %
Neutro Abs: 9 10*3/uL — ABNORMAL HIGH (ref 1.7–7.7)
Neutrophils Relative %: 72 %

## 2023-08-27 LAB — COMPREHENSIVE METABOLIC PANEL WITH GFR
ALT: 13 U/L (ref 0–44)
AST: 19 U/L (ref 15–41)
Albumin: 3.6 g/dL (ref 3.5–5.0)
Alkaline Phosphatase: 66 U/L (ref 38–126)
Anion gap: 8 (ref 5–15)
BUN: 11 mg/dL (ref 8–23)
CO2: 25 mmol/L (ref 22–32)
Calcium: 8.9 mg/dL (ref 8.9–10.3)
Chloride: 101 mmol/L (ref 98–111)
Creatinine, Ser: 0.89 mg/dL (ref 0.61–1.24)
GFR, Estimated: >60 mL/min (ref >60)
Glucose, Bld: 116 mg/dL — ABNORMAL HIGH (ref 70–99)
Potassium: 3.9 mmol/L (ref 3.5–5.1)
Sodium: 134 mmol/L — ABNORMAL LOW (ref 135–145)
Total Bilirubin: 0.8 mg/dL (ref 0.0–1.2)
Total Protein: 7.6 g/dL (ref 6.5–8.1)

## 2023-08-27 LAB — TSH: TSH: 0.74 u[IU]/mL (ref 0.350–4.500)

## 2023-08-27 LAB — PROTIME-INR
INR: 1.1 (ref 0.8–1.2)
Prothrombin Time: 14.6 s (ref 11.4–15.2)

## 2023-08-27 LAB — AMMONIA: Ammonia: <13 umol/L (ref 9–35)

## 2023-08-27 LAB — ETHANOL: Alcohol, Ethyl (B): <15 mg/dL (ref <15)

## 2023-08-27 LAB — CREATININE, SERUM
Creatinine, Ser: 0.86 mg/dL (ref 0.61–1.24)
GFR, Estimated: >60 mL/min (ref >60)

## 2023-08-27 LAB — APTT: aPTT: 34 s (ref 24–36)

## 2023-08-27 LAB — CBG MONITORING, ED: Glucose-Capillary: 112 mg/dL — ABNORMAL HIGH (ref 70–99)

## 2023-08-27 MED ORDER — ACETAMINOPHEN 650 MG RE SUPP
650.0000 mg | RECTAL | Status: DC | PRN
  Administered 2023-08-28 (×2): 650 mg via RECTAL
  Filled 2023-08-27 (×2): qty 1

## 2023-08-27 MED ORDER — SODIUM CHLORIDE 0.9% FLUSH
3.0000 mL | Freq: Once | INTRAVENOUS | Status: AC
  Administered 2023-08-27: 3 mL via INTRAVENOUS

## 2023-08-27 MED ORDER — ACETAMINOPHEN 325 MG PO TABS: 650.0000 mg | ORAL_TABLET | ORAL | Status: DC | PRN

## 2023-08-27 MED ORDER — ENOXAPARIN SODIUM 40 MG/0.4ML IJ SOSY
40.0000 mg | PREFILLED_SYRINGE | INTRAMUSCULAR | Status: DC
  Administered 2023-08-27: 40 mg via SUBCUTANEOUS
  Filled 2023-08-27: qty 0.4

## 2023-08-27 MED ORDER — ASPIRIN 81 MG PO CHEW: 81.0000 mg | CHEWABLE_TABLET | Freq: Once | ORAL | Status: DC

## 2023-08-27 MED ORDER — ACETAMINOPHEN 160 MG/5ML PO SOLN: 650.0000 mg | ORAL | Status: DC | PRN

## 2023-08-27 MED ORDER — CLOPIDOGREL BISULFATE 75 MG PO TABS
75.0000 mg | ORAL_TABLET | Freq: Once | ORAL | Status: DC
Start: 2023-08-27 — End: 2023-08-27

## 2023-08-27 MED ORDER — STROKE: EARLY STAGES OF RECOVERY BOOK: Freq: Once | Status: AC

## 2023-08-27 MED ORDER — SODIUM CHLORIDE 0.9 % IV SOLN: INTRAVENOUS | Status: DC

## 2023-08-27 MED ORDER — IOHEXOL 350 MG/ML SOLN
75.0000 mL | Freq: Once | INTRAVENOUS | Status: AC | PRN
  Administered 2023-08-27: 75 mL via INTRAVENOUS

## 2023-08-27 MED ORDER — ASPIRIN 300 MG RE SUPP
300.0000 mg | Freq: Every day | RECTAL | Status: DC
Start: 2023-08-27 — End: 2023-08-28
  Administered 2023-08-27 – 2023-08-28 (×2): 300 mg via RECTAL
  Filled 2023-08-27 (×2): qty 1

## 2023-08-27 NOTE — ED Notes (Signed)
 Assumed care of patient. Patient placed on hospital bed. Daughter at bedside. Patient makes moaning noises. IVF continues to infuse.

## 2023-08-27 NOTE — Progress Notes (Signed)
 SLP Cancellation Note  Patient Details Name: Christopher Murphy MRN: [id number] DOB: January 31, 1946   Cancelled treatment:       Reason Eval/Treat Not Completed: Patient not medically ready (chart reviewed)  Per chart notes, pt admitted to ED minimally engaging, not following commands. NIHSS score of 23/42. Per chart note, global aphasia and dysarthria have been reported. Code Stroke called.  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 Pt failed his initial swallow screen w/ NSG at 13:40 per chart.  Will HOLD on BSE today and f/u tomorrow to give pt Time to stabilize. Recommend frequent oral care when awake for hygiene and stimulation of swallowing.     Darla Edward, MS, CCC-SLP Speech Language Pathologist Rehab Services; [facility] [phone] (ascom) Aslan Montagna 08/27/2023, 2:44 PM

## 2023-08-27 NOTE — ED Triage Notes (Signed)
 Pt brought in by his daughter. Daughter sts that pt has dementia and is normally able to dress himself as well as do his morning prayers. Daughter sts that pt has had a rough time this morning getting up and not able to walk or talk. Daughter sts that pt just repeats one work only. Pt LKW was 2130 last night. Pt has hx of stroke

## 2023-08-27 NOTE — ED Notes (Signed)
 Pt to CT from lobby with family member accompanying.

## 2023-08-27 NOTE — Consult Note (Signed)
 NEUROLOGY CONSULT NOTE   Date of service: Aug 27, 2023 Patient Name: Christopher Murphy MRN:  [id number] DOB:  16-Dec-1945 Chief Complaint: stroke code Requesting Provider: Charlesetta Connors, DO  History of Present Illness  Christopher Murphy is a 78 y.o. male with hx of dementia, prior CVA with right-sided deficits who was brought to the emergency department for speech impairment and altered mental status that was noticed by his family this morning.  Patient was last seen at recent baseline last night when he went to bed at 9:30 PM.  This morning he had no intelligible speech except for one sentence that he said approximately 9:30 AM requesting that his family bring him his prayer book.  While the sentence made sense his family stated that it did not make sense in the context of conversation.  He speaks [place] and his daughter who is a healthcare provider at bedside is translating.  He has not followed commands since yesterday and has had no intelligible speech other than that sentence. Additional history obtained from Dr. Janett Medin his outpatient neurologist: pt has severe dementia at baseline with occasional episodes of speech impairment but usually resolving in an hour, able to ambulate sometimes with a cane but not safely, requires 24 hr supervision at home. mRS 4-5.  LKW: 0930 last night Modified rankin score: 4-Needs assistance to walk and tend to bodily needs IV Thrombolysis: no outside of window EVT: no poor baseline functional status, no intervenable lesion  NIHSS components Score: Comment  1a Level of Conscious 0[]  1[x]  2[]  3[]      1b LOC Questions 0[]  1[]  2[x]       1c LOC Commands 0[]  1[]  2[x]       2 Best Gaze 0[x]  1[]  2[]       3 Visual 0[x]  1[]  2[]  3[]      4 Facial Palsy 0[]  1[x]  2[]  3[]      5a Motor Arm - left 0[]  1[]  2[]  3[x]  4[]  UN[]    5b Motor Arm - Right 0[]  1[]  2[]  3[x]  4[]  UN[]    6a Motor Leg - Left 0[]  1[]  2[]  3[x]  4[]  UN[]    6b Motor Leg - Right 0[]  1[]  2[]  3[x]  4[]   UN[]    7 Limb Ataxia 0[x]  1[]  2[]  UN[]      8 Sensory 0[]  1[x]  2[]  UN[]      9 Best Language 0[]  1[]  2[]  3[x]      10 Dysarthria 0[]  1[]  2[x]  UN[]      11 Extinct. and Inattention 0[x]  1[]  2[]       TOTAL:  24     ROS  Unable to ascertain due to dementia  Past History   Past Medical History:  Diagnosis Date   Blood clots in brain 2009   Stroke [facility])     Past Surgical History:  Procedure Laterality Date   COLONOSCOPY WITH PROPOFOL  N/A 04/30/2019   Procedure: COLONOSCOPY WITH PROPOFOL ;  Surgeon: Luke Salaam, MD;  Location: [facility] ENDOSCOPY;  Service: Gastroenterology;  Laterality: N/A;   INTRAMEDULLARY (IM) NAIL INTERTROCHANTERIC Left 06/19/2022   Procedure: INTRAMEDULLARY (IM) NAIL INTERTROCHANTERIC;  Surgeon: Lorri Rota, MD;  Location: [facility] ORS;  Service: Orthopedics;  Laterality: Left;   PROSTATECTOMY  1980    Family History: Family History  Problem Relation Age of Onset   AAA (abdominal aortic aneurysm) Neg Hx     Social History  reports that he has never smoked. He has never used smokeless tobacco. He reports that he does not drink alcohol  and does not use drugs.  No Known Allergies  Medications   Current Facility-Administered Medications:    [START ON 08/28/2023]  stroke: early stages of recovery book, , Does not apply, Once, Krugh, Marissa C, DO   0.9 %  sodium chloride  infusion, , Intravenous, Continuous, Krugh, Marissa C, DO, Last Rate: 40 mL/hr at 24-Sep-2023 1711, Infusion Verify at 09-24-2023 1711   acetaminophen  (TYLENOL ) tablet 650 mg, 650 mg, Oral, Q4H PRN **OR** acetaminophen  (TYLENOL ) 160 MG/5ML solution 650 mg, 650 mg, Per Tube, Q4H PRN **OR** acetaminophen  (TYLENOL ) suppository 650 mg, 650 mg, Rectal, Q4H PRN, Krugh, Marissa C, DO   aspirin  suppository 300 mg, 300 mg, Rectal, Daily, Eleni Griffin, MD, 300 mg at 09-24-2023 1646   enoxaparin  (LOVENOX ) injection 40 mg, 40 mg, Subcutaneous, Q24H, Krugh, Marissa C, DO, 40 mg at 2023/09/24 2048  Vitals   Vitals:    09/24/23 1630 2023-09-24 1757 2023-09-24 1839 09/24/23 2000  BP: (!) 150/90 (!) 151/82 (!) 147/88   Pulse: 90 91 91   Resp: 20 18 18    Temp:  97.7 F (36.5 C) 99.1 F (37.3 C)   TempSrc:  Oral Axillary   SpO2: 99% 97% 100%   Weight:    68.1 kg  Height:    5\' 10"  (1.778 m)    Body mass index is 21.54 kg/m.  Physical Exam   Gen: patient lying in bed, NAD CV: extremities appear well-perfused Resp: normal WOB  Neurologic exam MS: lethargic, arousable to loud voice, does not follow commands in [place] Speech: no intelligible speech per daughter at bedside CN: PERRL, blinks to threat bilat, EOMI, R facial droop Motor and sensory: withdraws in all extremities much less briskly on L, no movement antigravity in any extremity Reflexes: 2+ symm with toes down bilat Coordination: UTA Gait: deferred  Labs/Imaging/Neurodiagnostic studies   CBC:  Recent Labs  Lab 2023/09/24 1049 09-24-2023 1601  WBC 12.4* 11.1*  NEUTROABS 9.0*  --   HGB 12.9* 12.9*  HCT 40.5 39.2  MCV 81.2 80.0  PLT 308 290   Basic Metabolic Panel:  Lab Results  Component Value Date   NA 134 (L) 09-24-23   K 3.9 09/24/2023   CO2 25 Sep 24, 2023   GLUCOSE 116 (H) 09/24/2023   BUN 11 2023-09-24   CREATININE 0.86 09-24-23   CALCIUM  8.9 2023-09-24   GFRNONAA >60 September 24, 2023   GFRAA >60 10/28/2019   Lipid Panel:  Lab Results  Component Value Date   LDLCALC 55 06/26/2023   HgbA1c:  Lab Results  Component Value Date   HGBA1C 5.5 06/26/2023   Urine Drug Screen: No results found for: "LABOPIA", "COCAINSCRNUR", "LABBENZ", "AMPHETMU", "THCU", "LABBARB"  Alcohol  Level     Component Value Date/Time   ETH <15 09/24/2023 1049   INR  Lab Results  Component Value Date   INR 1.1 September 24, 2023   APTT  Lab Results  Component Value Date   APTT 34 09/24/2023   AED levels: No results found for: "PHENYTOIN", "ZONISAMIDE", "LAMOTRIGINE", "LEVETIRACETA"  CT Head without contrast(Personally reviewed): No acute  process  CT angio Head and Neck with contrast(Personally reviewed):  Irregularity of the right vertebral artery at the distal V3 segment with significantly diminished intraluminal contrast within the vessel from the distal V3 segment to the vertebrobasilar confluence. Abnormal intraluminal soft tissue in the proximal V4 segment concerning for dissection flap. Nonocclusive thrombus is less likely. There is no evidence of occlusive thrombus.   Additional diminished contrast within the basilar artery and right posterior cerebral artery. Diminished caliber of distal right PCA branches without  focal occlusion identified, overall similar in caliber to 2023.   No core infarct identified on CT perfusion. Areas of elevated T-max in the cerebellum and right occipital lobe concerning for hypoperfusion.   Diminished caliber of the right PCA appears similar to prior.   Normal caliber of vasculature in the anterior circulation without occlusion, high-grade stenosis, dissection, or aneurysm.  On personal review and discussion with Dr. Janett Medin his vascular neurologist findings in R vertebral artery appear chronic  ASSESSMENT   Dorsie Kelsey Edman is a 78 y.o. male with hx of dementia, prior CVA with right-sided deficits who was brought to the emergency department for aphasia with last known well of last night. He was not a candidate for TNK or intervention. Recommend admission for stroke workup and for treatable causes that might be contributing to AMS.  RECOMMENDATIONS   - Admit for stroke workup - Permissive HTN x48 hrs from sx onset or until stroke ruled out by MRI goal BP <220/120. PRN labetalol or hydralazine if BP above these parameters. Avoid oral antihypertensives. - TSH, B12, ammonia, UA, UDS - rEEG - MRI brain wo contrast - TTE - Check A1c and LDL + add statin per guidelines - ASA 81mg  daily + plavix  75mg  daily x90 days f/b plavix  75mg  daily monotherapy after that  - q4 hr neuro  checks - STAT head CT for any change in neuro exam - Tele - PT/OT/SLP - Stroke education - Amb referral to neurology upon discharge   - Will continue to follow ______________________________________________________________________    Signed, Eleni Griffin, MD [facility]

## 2023-08-27 NOTE — ED Notes (Signed)
 Dr stating pt is normally contracted on right side but seems worse today. Baseline pt able to dress self, walks with a cane and is able to communicate needs.

## 2023-08-27 NOTE — ED Provider Notes (Signed)
 [facility] Provider Note    Event Date/Time   First MD Initiated Contact with Patient 08/27/23 1134     (approximate)   History   Altered Mental Status  Pt brought in by his daughter. Daughter sts that pt has dementia and is normally able to dress himself as well as do his morning prayers. Daughter sts that pt has had a rough time this morning getting up and not able to walk or talk. Daughter sts that pt just repeats one work only. Pt LKW was 2130 last night. Pt has hx of stroke   HPI Christopher Murphy is a 78 y.o. male prior CVA w/ residual R-sided deficits, iron deficiency anemia, vascular dementia presents for evaluation of altered mental status - Per family, patient went to bed around 9:30 PM yesterday evening, acting like his usual self.  Woke up this morning around 630 and was minimally interactive, does this on occasion but usually improves.  He did speak with them around 9:30 AM asking for a peripheral block but has been nonverbal and noninteractive since then.  Not able to stand/walk since that time.       Physical Exam   Triage Vital Signs: ED Triage Vitals  Encounter Vitals Group     BP 08/27/23 1041 (!) 157/84     Systolic BP Percentile --      Diastolic BP Percentile --      Pulse Rate 08/27/23 1041 82     Resp 08/27/23 1041 18     Temp 08/27/23 1042 97.8 F (36.6 C)     Temp Source 08/27/23 1042 Axillary     SpO2 08/27/23 1041 100 %     Weight --      Height --      Head Circumference --      Peak Flow --      Pain Score 08/27/23 1040 0     Pain Loc --      Pain Education --      Exclude from Growth Chart --     Most recent vital signs: Vitals:   08/27/23 1200 08/27/23 1300  BP: (!) 141/66 (!) 147/69  Pulse: 75 80  Resp: 20 20  Temp:    SpO2: 100% 99%     General: Arousable to verbal stimuli, protecting airway HEENT:  PERRL CV:  Good peripheral perfusion. RRR, RP 2+ Resp:  Normal effort. CTAB Abd:  No  distention. Nontender to deep palpation throughout Neuro:  Moves all extremities spontaneously, otherwise not interactive neurologic exam, +rightward gaze   ED Results / Procedures / Treatments   Labs (all labs ordered are listed, but only abnormal results are displayed) Labs Reviewed  CBC - Abnormal; Notable for the following components:      Result Value   WBC 12.4 (*)    Hemoglobin 12.9 (*)    MCH 25.9 (*)    All other components within normal limits  DIFFERENTIAL - Abnormal; Notable for the following components:   Neutro Abs 9.0 (*)    All other components within normal limits  COMPREHENSIVE METABOLIC PANEL WITH GFR - Abnormal; Notable for the following components:   Sodium 134 (*)    Glucose, Bld 116 (*)    All other components within normal limits  CBC - Abnormal; Notable for the following components:   WBC 11.1 (*)    Hemoglobin 12.9 (*)    All other components within normal limits  CBG MONITORING, ED - Abnormal; Notable  for the following components:   Glucose-Capillary 112 (*)    All other components within normal limits  PROTIME-INR  APTT  ETHANOL  URINALYSIS, COMPLETE (UACMP) WITH MICROSCOPIC  URINE DRUG SCREEN, QUALITATIVE ([facility] ONLY)  CREATININE, SERUM  CBG MONITORING, ED     EKG  Ecg = sinus rhythm, rate 79, no clear ST elevation or depression, no significant repolarization normality.  No evidence of ischemia on my eval.  Interpretation limited by artifact.   RADIOLOGY Radiology interpreted by myself and radiology reports reviewed and discussed with neurologist.    PROCEDURES:  Critical Care performed: Yes, see critical care procedure note(s)  .Critical Care  Performed by: Collis Deaner, MD Authorized by: Collis Deaner, MD   Critical care provider statement:    Critical care time (minutes):  30   Critical care was necessary to treat or prevent imminent or life-threatening deterioration of the following conditions:  CNS failure or compromise  (code stroke)   Critical care was time spent personally by me on the following activities:  Development of treatment plan with patient or surrogate, discussions with consultants, evaluation of patient's response to treatment, examination of patient, ordering and review of laboratory studies, ordering and review of radiographic studies, ordering and performing treatments and interventions, pulse oximetry, re-evaluation of patient's condition and review of old charts    MEDICATIONS ORDERED IN ED: Medications  aspirin  chewable tablet 81 mg (81 mg Oral Not Given 08/27/23 1341)  clopidogrel  (PLAVIX ) tablet 75 mg (75 mg Oral Not Given 08/27/23 1341)   stroke: early stages of recovery book (has no administration in time range)  0.9 %  sodium chloride  infusion (has no administration in time range)  acetaminophen  (TYLENOL ) tablet 650 mg (has no administration in time range)    Or  acetaminophen  (TYLENOL ) 160 MG/5ML solution 650 mg (has no administration in time range)    Or  acetaminophen  (TYLENOL ) suppository 650 mg (has no administration in time range)  enoxaparin  (LOVENOX ) injection 40 mg (has no administration in time range)  sodium chloride  flush (NS) 0.9 % injection 3 mL (3 mLs Intravenous Given 08/27/23 1331)  iohexol  (OMNIPAQUE ) 350 MG/ML injection 75 mL (75 mLs Intravenous Contrast Given 08/27/23 1256)     IMPRESSION / MDM / ASSESSMENT AND PLAN / ED COURSE  I reviewed the triage vital signs and the nursing notes.                              DDX/MDM/AP: Differential diagnosis includes, but is not limited to, recurrent CVA with stuttering symptoms outside of acute stroke window, consider underlying infectious or metabolic encephalopathy.  Patient was technically last known well around 9:30 PM yesterday, is outside of acute stroke window though did have some stuttering symptoms today and could possibly have a large vessel occlusion-will discuss with neurology regarding whether they would like a  code stroke activation in case patient may be a thrombectomy candidate.  Plan: - Labs and CT head already ordered at triage - Will discuss possible code stroke activation with on-call neurologist --stuttering symptoms, given degree of deficits here consider possibility of LVO--would be within timeframe window of thrombectomy - EKG - Anticipate admission  Patient's presentation is most consistent with acute presentation with potential threat to life or bodily function.  The patient is on the cardiac monitor to evaluate for evidence of arrhythmia and/or significant heart rate changes.  ED course below.  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  Hospitalist recommends admitting patient to hospital for further stroke/AMS workup.  DAPT though patient failed swallow study in ED.  Clinical Course as of 08/27/23 1612  Tue Aug 27, 2023  1147 CBC with mild leukocytosis, hemoglobin essentially at baseline range.  CMP reviewed, overall unremarkable [MM]  1224 Discussed with Dr. Doretta Gant of neurology, activating code stroke, request CT perfusion study [MM]  1323 Neuro recs: MRI Admit for stroke/AMS w/u Eval for other causes of AMS DAPT, already on plavix   [MM]    Clinical Course User Index [MM] Collis Deaner, MD     FINAL CLINICAL IMPRESSION(S) / ED DIAGNOSES   Final diagnoses:  Altered mental status, unspecified altered mental status type     Rx / DC Orders   ED Discharge Orders     None        Note:  This document was prepared using Dragon voice recognition software and may include unintentional dictation errors.   Collis Deaner, MD 08/27/23 [phone]

## 2023-08-27 NOTE — Progress Notes (Signed)
   08/27/23 1230  Spiritual Encounters  Type of Visit Initial  Care provided to: [facility] partners present during encounter Nurse  Reason for visit Code  OnCall Visit No   Unit Chaplain went to a CODE STROKE call sent to the on-call phone.  Chaplain introduced herself and Chaplain Services to the family who was waiting (son and daughter-in-law).  Chaplain offered spiritual care and empathized with the family having to wait for answers.  Chaplain offered water to the family but both said they were OK and didn't need anything.  Chaplain encouraged them to let the Nurse know if there was anything they needed from [employer].    Rev. Rana M. Nolon Baxter, M.Div. Chaplain Resident [facility]

## 2023-08-27 NOTE — Progress Notes (Signed)
 Code stroke activated at 1229. Dr. Doretta Gant at bedside. LKWT reported as 08/26/2023 at 2130. Pt not following commands and unintelligible speech this morning. Non-contrast head CT already completed. Pt taken back to CT for CTA/P at 1236. TSRN dismissed at 1253. No TNK, LKWT >4.5 hours.

## 2023-08-27 NOTE — H&P (Signed)
 History and Physical    Patient: Christopher Murphy UEA:[id number] DOB: Feb 22, 1946 DOA: 08/27/2023 DOS: the patient was seen and examined on 08/27/2023 PCP: Carollynn Cirri, NP  Patient coming from: Home  Chief Complaint:  Chief Complaint  Patient presents with   Altered Mental Status   HPI: Christopher Murphy is a 78 y.o. male with medical history significant of dementia, prior CVA with right sided deficit,  presents to the emergency department for evaluation of worsening right-sided deficits and confusion that started earlier this morning.  He is usually conversational and ambulatory per family and was at his baseline last night.  This morning, patient could not get himself out of bed and was speaking  nonsensically, prompting family to bring him in for evaluation.  similar episodes reported on most recent neurology follow-up note. While in the emergency department, he continued to decline to the point he is no longer responsive or following commands. code stroke was activated.  CT head (obtained prior to activation of code stroke) revealed extensive chronic changes.  Admission was requested for further evaluation and management.  Per his son, patient is a DNR and would not want any life-prolonging measures such as feeding tube.     Rreview of Systems: Review of Systems  Unable to perform ROS: Patient unresponsive    Past Medical History:  Diagnosis Date   Blood clots in brain 2009   Stroke [facility])    Past Surgical History:  Procedure Laterality Date   COLONOSCOPY WITH PROPOFOL  N/A 04/30/2019   Procedure: COLONOSCOPY WITH PROPOFOL ;  Surgeon: Luke Salaam, MD;  Location: [facility] ENDOSCOPY;  Service: Gastroenterology;  Laterality: N/A;   INTRAMEDULLARY (IM) NAIL INTERTROCHANTERIC Left 06/19/2022   Procedure: INTRAMEDULLARY (IM) NAIL INTERTROCHANTERIC;  Surgeon: Lorri Rota, MD;  Location: [facility] ORS;  Service: Orthopedics;  Laterality: Left;   PROSTATECTOMY  1980   Social History:   reports that he has never smoked. He has never used smokeless tobacco. He reports that he does not drink alcohol  and does not use drugs.  No Known Allergies  Family History  Problem Relation Age of Onset   AAA (abdominal aortic aneurysm) Neg Hx     Prior to Admission medications   Medication Sig Start Date End Date Taking? Authorizing Provider  albuterol  (VENTOLIN  HFA) 108 (90 Base) MCG/ACT inhaler Inhale 2 puffs into the lungs every 6 (six) hours as needed for wheezing or shortness of breath. 06/22/21   Angelia Kelp, PA-C  aspirin  EC (ASPIRIN  LOW DOSE) 81 MG tablet Take 1 tablet (81 mg total) by mouth daily. Swallow whole. 06/23/22   Ezzard Holms, MD  docusate sodium  (COLACE) 100 MG capsule Take 1 capsule (100 mg total) by mouth 2 (two) times daily. 06/22/22   Ezzard Holms, MD  donepezil  (ARICEPT ) 10 MG tablet Take 1 tablet (10 mg total) by mouth at bedtime. 08/21/23   Sethi, Pramod S, MD  fluticasone  (FLONASE ) 50 MCG/ACT nasal spray Place 2 sprays into both nostrils daily. Use for 4-6 weeks then stop and use seasonally or as needed. 07/15/23   Carollynn Cirri, NP  folic acid  (FOLVITE ) 1 MG tablet Take 1 tablet (1 mg total) by mouth daily. 12/27/22   Carollynn Cirri, NP  memantine  (NAMENDA ) 10 MG tablet Take 1 tablet (10 mg total) by mouth 2 (two) times daily. 08/21/23   Sethi, Pramod S, MD  Multiple Vitamin (MULTIVITAMIN WITH MINERALS) TABS tablet Take 1 tablet by mouth daily. 06/23/22   Ezzard Holms, MD  pantoprazole  (PROTONIX ) 40 MG tablet TAKE 1 TABLET BY MOUTH EVERY DAY 01/15/22   Carollynn Cirri, NP  Polyethyl Glycol-Propyl Glycol (SYSTANE) 0.4-0.3 % SOLN Apply 1 drop to eye 2 (two) times daily. Patient taking differently: Place 1 drop into both eyes 2 (two) times daily. 06/12/16   Carollynn Cirri, NP  polyethylene glycol (MIRALAX  / GLYCOLAX ) 17 g packet Take 17 g by mouth daily. 05/14/20   Ghimire, Estil Heman, MD  rosuvastatin  (CRESTOR ) 20 MG tablet Take 1 tablet (20 mg total) by mouth  daily. 06/26/23   Carollynn Cirri, NP  traZODone  (DESYREL ) 50 MG tablet TAKE 0.5-1 TABLETS BY MOUTH AT BEDTIME AS NEEDED FOR SLEEP. 06/21/23   Carollynn Cirri, NP  triamcinolone  cream (KENALOG ) 0.1 % APPLY TO AFFECTED AREA TWICE A DAY 05/10/23   Carollynn Cirri, NP    Physical Exam: Vitals:   08/27/23 1041 08/27/23 1042 08/27/23 1200 08/27/23 1300  BP: (!) 157/84  (!) 141/66 (!) 147/69  Pulse: 82  75 80  Resp: 18  20 20   Temp:  97.8 F (36.6 C)    TempSrc:  Axillary    SpO2: 100%  100% 99%   Physical Exam Vitals and nursing note reviewed.  Constitutional:      Comments: unresponsive  HENT:     Head: Normocephalic and atraumatic.  Cardiovascular:     Rate and Rhythm: Normal rate.  Pulmonary:     Effort: Pulmonary effort is normal. No respiratory distress.  Abdominal:     Palpations: Abdomen is soft.     Tenderness: There is no abdominal tenderness.  Musculoskeletal:        General: Deformity present.     Cervical back: Neck supple.     Right lower leg: No edema.     Comments: RUE contracted  Neurological:     Mental Status: He is unresponsive.     GCS: GCS eye subscore is 4. GCS verbal subscore is 1. GCS motor subscore is 4.     Motor: Weakness, tremor and atrophy present.     Comments: Pupils equal b/l but sluggish Coreneal reflex present RUE tremmor Not following commands or responding to verbal stimuli     Data Reviewed:   Labs on Admission: I have personally reviewed following labs and imaging studies  CBC: Recent Labs  Lab 08/27/23 1049  WBC 12.4*  NEUTROABS 9.0*  HGB 12.9*  HCT 40.5  MCV 81.2  PLT 308   Basic Metabolic Panel: Recent Labs  Lab 08/27/23 1049  NA 134*  K 3.9  CL 101  CO2 25  GLUCOSE 116*  BUN 11  CREATININE 0.89  CALCIUM  8.9   GFR: CrCl cannot be calculated (Unknown ideal weight.). Liver Function Tests: Recent Labs  Lab 08/27/23 1049  AST 19  ALT 13  ALKPHOS 66  BILITOT 0.8  PROT 7.6  ALBUMIN 3.6   No results for  input(s): "LIPASE", "AMYLASE" in the last 168 hours. No results for input(s): "AMMONIA" in the last 168 hours. Coagulation Profile: Recent Labs  Lab 08/27/23 1049  INR 1.1   Cardiac Enzymes: No results for input(s): "CKTOTAL", "CKMB", "CKMBINDEX", "TROPONINI" in the last 168 hours. BNP (last 3 results) No results for input(s): "PROBNP" in the last 8760 hours. HbA1C: No results for input(s): "HGBA1C" in the last 72 hours. CBG: Recent Labs  Lab 08/27/23 1039  GLUCAP 112*   Lipid Profile: No results for input(s): "CHOL", "HDL", "LDLCALC", "TRIG", "CHOLHDL", "LDLDIRECT" in the last 72 hours.  Thyroid  Function Tests: No results for input(s): "TSH", "T4TOTAL", "FREET4", "T3FREE", "THYROIDAB" in the last 72 hours. Anemia Panel: No results for input(s): "VITAMINB12", "FOLATE", "FERRITIN", "TIBC", "IRON", "RETICCTPCT" in the last 72 hours. Urine analysis:    Component Value Date/Time   COLORURINE YELLOW (A) 06/18/2022 2347   APPEARANCEUR HAZY (A) 06/18/2022 2347   LABSPEC 1.024 06/18/2022 2347   PHURINE 5.0 06/18/2022 2347   GLUCOSEU NEGATIVE 06/18/2022 2347   HGBUR MODERATE (A) 06/18/2022 2347   BILIRUBINUR NEGATIVE 06/18/2022 2347   BILIRUBINUR neg 11/13/2019 1741   KETONESUR NEGATIVE 06/18/2022 2347   PROTEINUR 100 (A) 06/18/2022 2347   UROBILINOGEN 0.2 11/13/2019 1741   NITRITE NEGATIVE 06/18/2022 2347   LEUKOCYTESUR NEGATIVE 06/18/2022 2347    Radiological Exams on Admission: DG Chest Portable 1 View Result Date: 08/27/2023 CLINICAL DATA:  Altered mental status. Recent cough. Evaluate for pneumonia. EXAM: PORTABLE CHEST 1 VIEW COMPARISON:  06/18/2022 FINDINGS: Both lungs are clear. Slightly decreased lung volumes. Heart and mediastinum are within normal limits. Trachea is midline. Negative for a pneumothorax. No acute bone abnormality. IMPRESSION: No active disease. Electronically Signed   By: Elene Griffes M.D.   On: 08/27/2023 15:21   CT HEAD WO CONTRAST Result Date:  08/27/2023 CLINICAL DATA:  Provided history: Possible stroke, at of window. EXAM: CT HEAD WITHOUT CONTRAST TECHNIQUE: Contiguous axial images were obtained from the base of the skull through the vertex without intravenous contrast. RADIATION DOSE REDUCTION: This exam was performed according to the departmental dose-optimization program which includes automated exposure control, adjustment of the mA and/or kV according to patient size and/or use of iterative reconstruction technique. COMPARISON:  Head CT 06/18/2022. FINDINGS: Brain: Advanced generalized cerebral atrophy. Chronic cortical/subcortical infarct again demonstrated within the left occipital lobe. Chronic infarct again demonstrated within the left thalamus. Background advanced patchy and confluent hypoattenuation within the cerebral white matter, nonspecific but compatible with chronic small vessel ischemic disease. There is no acute intracranial hemorrhage. No acute demarcated cortical infarct. No extra-axial fluid collection. No evidence of an intracranial mass. No midline shift. Vascular: No hyperdense vessel. Atherosclerotic calcifications. Vertebrobasilar dolichoectasia. Skull: No calvarial fracture or aggressive osseous lesion. Sinuses/Orbits: No mass or acute finding within the imaged orbits. Opacification of a few left ethmoid air cells. No acute intracranial finding. These results were communicated to Dr. Doretta Gant At 2:05 pmon 5/20/2025by text page via the [facility] messaging system. IMPRESSION: 1.  No acute intracranial finding. 2. Chronic cortical/subcortical infarct within the left occipital lobe (PCA vascular territory), unchanged. 3. Chronic infarct within the left thalamus, unchanged. 4. Background advanced cerebral atrophy and advanced cerebral white matter chronic small vessel ischemic disease. 5. Mild left ethmoid sinusitis. Electronically Signed   By: Bascom Lily D.O.   On: 08/27/2023 14:06   CT ANGIO HEAD NECK W WO CM W PERF (CODE  STROKE) Result Date: 08/27/2023 CLINICAL DATA:  Neuro deficit, concern for stroke, confused, nonverbal. History of dementia. EXAM: CT ANGIOGRAPHY HEAD AND NECK CT PERFUSION BRAIN TECHNIQUE: Multidetector CT imaging of the head and neck was performed using the standard protocol during bolus administration of intravenous contrast. Multiplanar CT image reconstructions and MIPs were obtained to evaluate the vascular anatomy. Carotid stenosis measurements (when applicable) are obtained utilizing NASCET criteria, using the distal internal carotid diameter as the denominator. Multiphase CT imaging of the brain was performed following IV bolus contrast injection. Subsequent parametric perfusion maps were calculated using RAPID software. RADIATION DOSE REDUCTION: This exam was performed according to the departmental dose-optimization program which includes  automated exposure control, adjustment of the mA and/or kV according to patient size and/or use of iterative reconstruction technique. CONTRAST:  75mL OMNIPAQUE  IOHEXOL  350 MG/ML SOLN COMPARISON:  Same day head CT.  CTA head and neck 03/11/2022. FINDINGS: CTA NECK FINDINGS Aortic arch: Standard configuration of the aortic arch. Imaged portion shows no evidence of aneurysm or dissection. No significant stenosis of the major arch vessel origins. Pulmonary arteries: As permitted by contrast timing, there are no filling defects in the visualized pulmonary arteries. Subclavian arteries: The subclavian arteries are patent bilaterally. Right carotid system: No evidence of dissection, stenosis (50% or greater), or occlusion. Left carotid system: No evidence of dissection, stenosis (50% or greater), or occlusion. Vertebral arteries: The left vertebral artery is dominant. Left vertebral artery is patent from the origin to the vertebrobasilar confluence. Slightly limited evaluation of the distal left V1 segment due to adjacent dense venous contrast. The non dominant right vertebral  artery is patent from the origin to the vertebrobasilar confluence. There is a focus of irregularity of the vessel along the distal V3 and proximal V4 segment with a possible intraluminal soft tissue flap noted within the proximal V4 segment. There is significantly limited contrast within the right vertebral artery from the distal V3 segment to the vertebrobasilar confluence. Skeleton: No acute or aggressive finding noted. Other neck: The visualized airway is patent. No cervical lymphadenopathy. Upper chest: Visualized lung apices are clear. Review of the MIP images confirms the above findings CTA HEAD FINDINGS ANTERIOR CIRCULATION: The intracranial ICAs are patent bilaterally. Minimal atherosclerosis along the left carotid siphon without stenosis. No significant stenosis, proximal occlusion, aneurysm, or vascular malformation. MCAs: Patent bilaterally. Mild narrowing of a distal P2/proximal P3 segment of the right PCA. ACAs: The anterior cerebral arteries are patent bilaterally. 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 The left PCA is patent with diminished intraluminal contrast noted. The P1 and proximal P2 segments are normal in caliber. Distal branches are smaller in caliber without focal occlusion identified. Pcomm: Not well visualized. SCAs: The superior cerebellar arteries are patent bilaterally. Basilar artery: The basilar artery is patent. Diminished intraluminal contrast within the basilar artery compared to the anterior circulation. AICAs: Not well visualized. PICAs: Visualized bilaterally. The left PICA is patent and demonstrates normal intraluminal contrast. Diminutive caliber of the left PICA with diminished intraluminal contrast although this appearance is similar to 2023. Vertebral arteries: As above. Venous sinuses: As permitted by contrast timing, patent. Anatomic variants: None Review of the MIP images confirms the above findings CT Brain Perfusion Findings:  CBF (<30%) Volume: 0mL Perfusion (Tmax>6.0s) volume: Mismatch Volume: Infarction Location:No core infarct identified. Regions of elevated T-max within the cerebellum, primarily on the right. Additional elevated T-max within the right occipital lobe and posterior right temporal lobe. 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 Abnormal intraluminal soft tissue in the proximal V4 segment concerning for dissection flap. Nonocclusive thrombus is less likely. There is no evidence of occlusive thrombus. Additional diminished contrast within the basilar artery and right posterior cerebral artery. Diminished caliber of distal right PCA branches without focal occlusion identified, overall similar in caliber to 2023. No core infarct identified on CT perfusion. Areas of elevated T-max in the cerebellum and right occipital lobe concerning for hypoperfusion. Diminished caliber of the right PCA appears similar to prior. Normal caliber of vasculature in the anterior  circulation without occlusion, high-grade stenosis, dissection, or aneurysm. These results were called by telephone at the time of interpretation on 08/27/2023 at 1:14 pm to provider Dr. Doretta Gant, who verbally acknowledged these results. Electronically Signed   By: Denny Flack M.D.   On: 08/27/2023 13:34       Assessment and Plan: No notes have been filed under this hospital service. Service: Hospitalist 78 y.o. male with medical history significant of mixed Alzheimer's and vascular dementia, CVA with right sided deficit presents to the emergency department for evaluation of worsening right-sided deficits and confusion/nonsensical speech that started earlier this morning.   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  While in the emergency department, he continued decline to the point he is no longer responsive.  Code stroke was activated.  Initial CT head was unremarkable for any acute changes, but with significant chronic changes.  Admitted for further evaluation.  Overall prognosis is guarded   Unresponsiveness  Hx CVA  - GCS is 9, per discussion with patient's son, he is DNR/DNI and would not be interested in [place].  Has poor baseline functional status - MRI, ECHO, EEG, monitor on telemetry. F/u UA, UDS. No metabolic derangement or reversible causes identified so far.  - NPO SLP evaluation, PT OT evaluations if/when appropriate - DAPT recommended by neurology.  Unfortunately, patient unable to do tolerate p.o. at this time due to his mental status. - PR asa for now  - resume statin if possible  - Permissive hypertension for now - seizure precautions  -Neurology following, appreciate expertise    Mixed Alzheimer's and vascular dementia -progressive.  Most recent neurology office visit note reviewed - Resume Aricept  and Namenda  if possible  Lovenox  NPO SLP evaluation  Monitor/replace electrolytes   NS@40cc /hour   Advance Care Planning:   Code Status: Do not attempt resuscitation (DNR) PRE-ARREST INTERVENTIONS DESIRED discussed with son at bedside.   Consults: Neurology, SLP  Family Communication: Son at bedside   Severity of Illness: The appropriate patient status for this patient is INPATIENT. Inpatient status is judged to be reasonable and necessary in order to provide the required intensity of service to ensure the patient's safety. The patient's presenting symptoms, physical exam findings, and initial radiographic and laboratory data in the context of their chronic comorbidities is felt to place them at high risk for further clinical deterioration. Furthermore, it is not anticipated that the patient  will be medically stable for discharge from the hospital within 2 midnights of admission.   * I certify that at the point of admission it is my clinical judgment that the patient will require inpatient hospital care spanning beyond 2 midnights from the point of admission due to high intensity of service, high risk for further deterioration and high frequency of surveillance required.*  Author: Charlesetta Connors, DO 08/27/2023 3:28 PM  For on call review [email or web address].

## 2023-08-27 NOTE — ED Notes (Addendum)
 Pt is moving all extremities but not much. Pt right arm contracted due to previous stroke. Daughter at bedside to help with communication. Pt not able to follow commands at this time.

## 2023-08-27 NOTE — Code Documentation (Signed)
 Stroke Response Nurse Documentation Code Documentation  Christopher Murphy is a 78 y.o. male arriving to [facility] via [employer] on 08/27/2023 with past medical hx of stroke with previous right sided deficits. On aspirin  81 mg daily. Code stroke was activated by ED.   Patient from home where he was LKW at 2130 5/19 and now complaining of not interacting as he normally does, per family. Family states patient was at baseline last night before bed. This morning, family noticed that patient was minimally reactive, not following commands, difficulty getting up walking or talking as he normally does.    Stroke team at the bedside on patient arrival. Labs drawn and patient cleared for CT by Dr. Cam Cava. Patient to CT with team. Non-con CT head complete before code stroke activation. NIHSS 23, see documentation for details and code stroke times. Patient with decreased LOC, disoriented, not following commands, bilateral arm weakness, bilateral leg weakness, Global aphasia , and dysarthria  on exam. The following imaging was completed:  CTA and CTP. Patient is not a candidate for IV Thrombolytic due to outside window, per MD. Patient is not a candidate for IR due to no emergent LVO, per MD.   Care Plan: every 2 hour NIHSS, swallow screen, per protocol.   Process Delays Noted: sx recognition.   Bedside handoff with ED RN Frankie Israel  Stroke Response RN

## 2023-08-27 NOTE — ED Notes (Signed)
 Patient transported to MRI

## 2023-08-27 NOTE — ED Notes (Signed)
 Pt daughter stating pt was able to speak this morning around 0900. She checked BP was 160/80 this morning.

## 2023-08-27 NOTE — ED Notes (Signed)
 First nurse note: Helped family get pt out of car. Pt woke up confused and nonverbal (speaking 1 word only per family). Pt was normal last night. Hx stroke.

## 2023-08-27 NOTE — ED Notes (Signed)
Code  stroke  called  to  [facility] 

## 2023-08-27 NOTE — Progress Notes (Signed)
 CODE STROKE- PHARMACY COMMUNICATION   Time CODE STROKE called/page received: 1224  Time response to CODE STROKE was made (in person or via phone): Immediately  Time Stroke Kit retrieved from [employer] (only if needed): Not a candidate. Out of window  Name of Provider/Nurse contacted: Dr. Doretta Gant  Past Medical History:  Diagnosis Date   Blood clots in brain 2009   Stroke [facility])    Page Boast 08/27/2023  1:28 PM

## 2023-08-28 ENCOUNTER — Inpatient Hospital Stay (HOSPITAL_COMMUNITY)

## 2023-08-28 ENCOUNTER — Inpatient Hospital Stay (HOSPITAL_COMMUNITY)
Admission: EM | Admit: 2023-08-28 | Discharge: 2023-10-08 | DRG: 056 | Disposition: E | Source: Other Acute Inpatient Hospital | Attending: Family Medicine | Admitting: Family Medicine

## 2023-08-28 ENCOUNTER — Encounter (HOSPITAL_COMMUNITY): Payer: Self-pay

## 2023-08-28 ENCOUNTER — Inpatient Hospital Stay

## 2023-08-28 ENCOUNTER — Inpatient Hospital Stay (HOSPITAL_COMMUNITY)
Admit: 2023-08-28 | Discharge: 2023-08-28 | Disposition: A | Discharge status: Home / Self Care | Attending: Emergency Medicine | Admitting: Emergency Medicine

## 2023-08-28 DIAGNOSIS — G9389 Other specified disorders of brain: Secondary | ICD-10-CM | POA: Diagnosis present

## 2023-08-28 DIAGNOSIS — Z6821 Body mass index (BMI) 21.0-21.9, adult: Secondary | ICD-10-CM

## 2023-08-28 DIAGNOSIS — R4701 Aphasia: Secondary | ICD-10-CM | POA: Diagnosis present

## 2023-08-28 DIAGNOSIS — D638 Anemia in other chronic diseases classified elsewhere: Secondary | ICD-10-CM | POA: Diagnosis present

## 2023-08-28 DIAGNOSIS — E43 Unspecified severe protein-calorie malnutrition: Secondary | ICD-10-CM | POA: Diagnosis present

## 2023-08-28 DIAGNOSIS — R569 Unspecified convulsions: Secondary | ICD-10-CM

## 2023-08-28 DIAGNOSIS — J690 Pneumonitis due to inhalation of food and vomit: Secondary | ICD-10-CM | POA: Diagnosis present

## 2023-08-28 DIAGNOSIS — G9340 Encephalopathy, unspecified: Principal | ICD-10-CM

## 2023-08-28 DIAGNOSIS — Z66 Do not resuscitate: Secondary | ICD-10-CM | POA: Diagnosis present

## 2023-08-28 DIAGNOSIS — R4182 Altered mental status, unspecified: Secondary | ICD-10-CM | POA: Diagnosis present

## 2023-08-28 DIAGNOSIS — E876 Hypokalemia: Secondary | ICD-10-CM | POA: Diagnosis present

## 2023-08-28 DIAGNOSIS — Z1152 Encounter for screening for COVID-19: Secondary | ICD-10-CM | POA: Diagnosis not present

## 2023-08-28 DIAGNOSIS — Z9079 Acquired absence of other genital organ(s): Secondary | ICD-10-CM

## 2023-08-28 DIAGNOSIS — G9341 Metabolic encephalopathy: Secondary | ICD-10-CM

## 2023-08-28 DIAGNOSIS — Z743 Need for continuous supervision: Secondary | ICD-10-CM | POA: Diagnosis not present

## 2023-08-28 DIAGNOSIS — Z558 Other problems related to education and literacy: Secondary | ICD-10-CM | POA: Diagnosis not present

## 2023-08-28 DIAGNOSIS — E785 Hyperlipidemia, unspecified: Secondary | ICD-10-CM | POA: Diagnosis present

## 2023-08-28 DIAGNOSIS — R1310 Dysphagia, unspecified: Secondary | ICD-10-CM | POA: Diagnosis not present

## 2023-08-28 DIAGNOSIS — I679 Cerebrovascular disease, unspecified: Secondary | ICD-10-CM | POA: Insufficient documentation

## 2023-08-28 DIAGNOSIS — G40801 Other epilepsy, not intractable, with status epilepticus: Secondary | ICD-10-CM | POA: Diagnosis present

## 2023-08-28 DIAGNOSIS — F03C0 Unspecified dementia, severe, without behavioral disturbance, psychotic disturbance, mood disturbance, and anxiety: Secondary | ICD-10-CM | POA: Diagnosis not present

## 2023-08-28 DIAGNOSIS — Z515 Encounter for palliative care: Secondary | ICD-10-CM | POA: Diagnosis not present

## 2023-08-28 DIAGNOSIS — J322 Chronic ethmoidal sinusitis: Secondary | ICD-10-CM | POA: Diagnosis present

## 2023-08-28 DIAGNOSIS — F01C4 Vascular dementia, severe, with anxiety: Secondary | ICD-10-CM | POA: Diagnosis present

## 2023-08-28 DIAGNOSIS — R5381 Other malaise: Secondary | ICD-10-CM | POA: Diagnosis present

## 2023-08-28 DIAGNOSIS — I6389 Other cerebral infarction: Secondary | ICD-10-CM

## 2023-08-28 DIAGNOSIS — Z8673 Personal history of transient ischemic attack (TIA), and cerebral infarction without residual deficits: Secondary | ICD-10-CM

## 2023-08-28 DIAGNOSIS — Z79899 Other long term (current) drug therapy: Secondary | ICD-10-CM | POA: Diagnosis not present

## 2023-08-28 DIAGNOSIS — Z7189 Other specified counseling: Secondary | ICD-10-CM | POA: Diagnosis not present

## 2023-08-28 DIAGNOSIS — I7774 Dissection of vertebral artery: Secondary | ICD-10-CM | POA: Diagnosis present

## 2023-08-28 DIAGNOSIS — R627 Adult failure to thrive: Secondary | ICD-10-CM | POA: Diagnosis present

## 2023-08-28 DIAGNOSIS — F03B0 Unspecified dementia, moderate, without behavioral disturbance, psychotic disturbance, mood disturbance, and anxiety: Secondary | ICD-10-CM | POA: Diagnosis not present

## 2023-08-28 DIAGNOSIS — D508 Other iron deficiency anemias: Secondary | ICD-10-CM | POA: Diagnosis present

## 2023-08-28 DIAGNOSIS — I69398 Other sequelae of cerebral infarction: Secondary | ICD-10-CM | POA: Diagnosis not present

## 2023-08-28 DIAGNOSIS — I10 Essential (primary) hypertension: Secondary | ICD-10-CM | POA: Diagnosis not present

## 2023-08-28 DIAGNOSIS — I712 Thoracic aortic aneurysm, without rupture, unspecified: Secondary | ICD-10-CM | POA: Diagnosis present

## 2023-08-28 DIAGNOSIS — R638 Other symptoms and signs concerning food and fluid intake: Secondary | ICD-10-CM | POA: Diagnosis not present

## 2023-08-28 DIAGNOSIS — G40804 Other epilepsy, intractable, without status epilepticus: Secondary | ICD-10-CM | POA: Diagnosis not present

## 2023-08-28 DIAGNOSIS — R1319 Other dysphagia: Secondary | ICD-10-CM | POA: Diagnosis not present

## 2023-08-28 DIAGNOSIS — B370 Candidal stomatitis: Secondary | ICD-10-CM | POA: Diagnosis not present

## 2023-08-28 DIAGNOSIS — F0390 Unspecified dementia without behavioral disturbance: Secondary | ICD-10-CM | POA: Insufficient documentation

## 2023-08-28 DIAGNOSIS — R4189 Other symptoms and signs involving cognitive functions and awareness: Secondary | ICD-10-CM | POA: Diagnosis present

## 2023-08-28 DIAGNOSIS — E8809 Other disorders of plasma-protein metabolism, not elsewhere classified: Secondary | ICD-10-CM | POA: Diagnosis present

## 2023-08-28 DIAGNOSIS — I69351 Hemiplegia and hemiparesis following cerebral infarction affecting right dominant side: Secondary | ICD-10-CM

## 2023-08-28 DIAGNOSIS — E871 Hypo-osmolality and hyponatremia: Secondary | ICD-10-CM | POA: Diagnosis present

## 2023-08-28 DIAGNOSIS — F0150 Vascular dementia without behavioral disturbance: Secondary | ICD-10-CM | POA: Diagnosis present

## 2023-08-28 DIAGNOSIS — E8721 Acute metabolic acidosis: Secondary | ICD-10-CM | POA: Diagnosis present

## 2023-08-28 DIAGNOSIS — Z7902 Long term (current) use of antithrombotics/antiplatelets: Secondary | ICD-10-CM

## 2023-08-28 DIAGNOSIS — Z789 Other specified health status: Secondary | ICD-10-CM | POA: Diagnosis not present

## 2023-08-28 DIAGNOSIS — Z7982 Long term (current) use of aspirin: Secondary | ICD-10-CM

## 2023-08-28 DIAGNOSIS — R402431 Glasgow coma scale score 3-8, in the field [EMT or ambulance]: Secondary | ICD-10-CM | POA: Diagnosis not present

## 2023-08-28 DIAGNOSIS — I639 Cerebral infarction, unspecified: Secondary | ICD-10-CM | POA: Diagnosis present

## 2023-08-28 DIAGNOSIS — R509 Fever, unspecified: Secondary | ICD-10-CM

## 2023-08-28 DIAGNOSIS — R404 Transient alteration of awareness: Secondary | ICD-10-CM | POA: Diagnosis not present

## 2023-08-28 DIAGNOSIS — D509 Iron deficiency anemia, unspecified: Secondary | ICD-10-CM | POA: Diagnosis not present

## 2023-08-28 LAB — LIPID PANEL
Cholesterol: 103 mg/dL (ref 0–200)
HDL: 42 mg/dL (ref >40)
LDL Cholesterol: 52 mg/dL (ref 0–99)
Total CHOL/HDL Ratio: 2.5 ratio
Triglycerides: 46 mg/dL (ref <150)
VLDL: 9 mg/dL (ref 0–40)

## 2023-08-28 LAB — URINE DRUG SCREEN, QUALITATIVE (ARMC ONLY)
Amphetamines, Ur Screen: NOT DETECTED
Barbiturates, Ur Screen: NOT DETECTED
Benzodiazepine, Ur Scrn: NOT DETECTED
Cannabinoid 50 Ng, Ur ~~LOC~~: NOT DETECTED
Cocaine Metabolite,Ur ~~LOC~~: NOT DETECTED
MDMA (Ecstasy)Ur Screen: NOT DETECTED
Methadone Scn, Ur: NOT DETECTED
Opiate, Ur Screen: NOT DETECTED
Phencyclidine (PCP) Ur S: NOT DETECTED
Tricyclic, Ur Screen: NOT DETECTED

## 2023-08-28 LAB — ECHOCARDIOGRAM COMPLETE
Area-P 1/2: 3.31 cm2
Height: 70 in
P 1/2 time: 606 ms
S' Lateral: 2.7 cm
Weight: 2402.13 [oz_av]

## 2023-08-28 LAB — BLOOD GAS, VENOUS
Acid-Base Excess: 0.6 mmol/L (ref 0.0–2.0)
Bicarbonate: 24.5 mmol/L (ref 20.0–28.0)
O2 Saturation: 74.7 %
Patient temperature: 36.8
pCO2, Ven: 36 mmHg — ABNORMAL LOW (ref 44–60)
pH, Ven: 7.44 — ABNORMAL HIGH (ref 7.25–7.43)
pO2, Ven: 36 mmHg (ref 32–45)

## 2023-08-28 LAB — BASIC METABOLIC PANEL WITH GFR
Anion gap: 12 (ref 5–15)
BUN: 14 mg/dL (ref 8–23)
CO2: 19 mmol/L — ABNORMAL LOW (ref 22–32)
Calcium: 8.5 mg/dL — ABNORMAL LOW (ref 8.9–10.3)
Chloride: 101 mmol/L (ref 98–111)
Creatinine, Ser: 0.85 mg/dL (ref 0.61–1.24)
GFR, Estimated: >60 mL/min (ref >60)
Glucose, Bld: 110 mg/dL — ABNORMAL HIGH (ref 70–99)
Potassium: 3.6 mmol/L (ref 3.5–5.1)
Sodium: 132 mmol/L — ABNORMAL LOW (ref 135–145)

## 2023-08-28 LAB — CBC
HCT: 38.7 % — ABNORMAL LOW (ref 39.0–52.0)
Hemoglobin: 12.8 g/dL — ABNORMAL LOW (ref 13.0–17.0)
MCH: 26.4 pg (ref 26.0–34.0)
MCHC: 33.1 g/dL (ref 30.0–36.0)
MCV: 79.8 fL — ABNORMAL LOW (ref 80.0–100.0)
Platelets: 250 10*3/uL (ref 150–400)
RBC: 4.85 MIL/uL (ref 4.22–5.81)
RDW: 13.9 % (ref 11.5–15.5)
WBC: 10.3 10*3/uL (ref 4.0–10.5)
nRBC: 0 % (ref 0.0–0.2)

## 2023-08-28 LAB — HEPATIC FUNCTION PANEL
ALT: 11 U/L (ref 0–44)
AST: 17 U/L (ref 15–41)
Albumin: 2.9 g/dL — ABNORMAL LOW (ref 3.5–5.0)
Alkaline Phosphatase: 49 U/L (ref 38–126)
Bilirubin, Direct: 0.1 mg/dL (ref 0.0–0.2)
Indirect Bilirubin: 0.5 mg/dL (ref 0.3–0.9)
Total Bilirubin: 0.6 mg/dL (ref 0.0–1.2)
Total Protein: 6.9 g/dL (ref 6.5–8.1)

## 2023-08-28 LAB — TSH: TSH: 0.587 u[IU]/mL (ref 0.350–4.500)

## 2023-08-28 LAB — URINALYSIS, COMPLETE (UACMP) WITH MICROSCOPIC
Bacteria, UA: NONE SEEN
Bilirubin Urine: NEGATIVE
Glucose, UA: NEGATIVE mg/dL
Ketones, ur: NEGATIVE mg/dL
Leukocytes,Ua: NEGATIVE
Nitrite: NEGATIVE
Protein, ur: >=300 mg/dL — AB
RBC / HPF: >50 RBC/hpf (ref 0–5)
Specific Gravity, Urine: 1.032 — ABNORMAL HIGH (ref 1.005–1.030)
Squamous Epithelial / HPF: 0 /HPF (ref 0–5)
pH: 6 (ref 5.0–8.0)

## 2023-08-28 LAB — MAGNESIUM: Magnesium: 1.9 mg/dL (ref 1.7–2.4)

## 2023-08-28 LAB — PHOSPHORUS: Phosphorus: 2.9 mg/dL (ref 2.5–4.6)

## 2023-08-28 LAB — SARS CORONAVIRUS 2 BY RT PCR: SARS Coronavirus 2 by RT PCR: NEGATIVE

## 2023-08-28 LAB — LACTIC ACID, PLASMA: Lactic Acid, Venous: 1.8 mmol/L (ref 0.5–1.9)

## 2023-08-28 MED ORDER — THIAMINE HCL 100 MG/ML IJ SOLN
500.0000 mg | Freq: Three times a day (TID) | INTRAVENOUS | Status: AC
  Administered 2023-08-29 – 2023-08-30 (×6): 500 mg via INTRAVENOUS
  Filled 2023-08-28 (×6): qty 5

## 2023-08-28 MED ORDER — SODIUM CHLORIDE 0.9 % IV SOLN
3.0000 g | Freq: Four times a day (QID) | INTRAVENOUS | Status: AC
  Administered 2023-08-28 – 2023-09-02 (×19): 3 g via INTRAVENOUS
  Filled 2023-08-28 (×21): qty 8

## 2023-08-28 MED ORDER — SODIUM CHLORIDE 0.9 % IV SOLN
2.0000 g | INTRAVENOUS | Status: DC
  Administered 2023-08-28: 2 g via INTRAVENOUS
  Filled 2023-08-28: qty 20

## 2023-08-28 MED ORDER — ENOXAPARIN SODIUM 40 MG/0.4ML IJ SOSY: 40.0000 mg | PREFILLED_SYRINGE | INTRAMUSCULAR | Status: DC

## 2023-08-28 MED ORDER — DEXTROSE-SODIUM CHLORIDE 5-0.9 % IV SOLN: INTRAVENOUS | Status: AC

## 2023-08-28 MED ORDER — ASPIRIN 300 MG RE SUPP: 300.0000 mg | Freq: Every day | RECTAL | Status: DC

## 2023-08-28 MED ORDER — ACETAMINOPHEN 650 MG RE SUPP: 650.0000 mg | RECTAL | Status: DC | PRN

## 2023-08-28 MED ORDER — SODIUM CHLORIDE 0.9 % IV SOLN: INTRAVENOUS | Status: DC

## 2023-08-28 MED ORDER — THIAMINE HCL 100 MG/ML IJ SOLN
500.0000 mg | Freq: Once | INTRAVENOUS | Status: AC
  Administered 2023-08-29: 500 mg via INTRAVENOUS
  Filled 2023-08-28: qty 5

## 2023-08-28 MED ORDER — ASPIRIN 300 MG RE SUPP
300.0000 mg | Freq: Every day | RECTAL | Status: DC
Start: 2023-08-29 — End: 2023-09-05
  Administered 2023-08-29 – 2023-09-04 (×7): 300 mg via RECTAL
  Filled 2023-08-28 (×8): qty 1

## 2023-08-28 MED ORDER — SYSTANE 0.4-0.3 % OP SOLN: 1.0000 [drp] | Freq: Two times a day (BID) | OPHTHALMIC | Status: DC

## 2023-08-28 MED ORDER — THIAMINE HCL 100 MG/ML IJ SOLN
250.0000 mg | INTRAMUSCULAR | Status: AC
  Administered 2023-09-01 – 2023-09-03 (×3): 250 mg via INTRAVENOUS
  Filled 2023-08-28 (×5): qty 2.5

## 2023-08-28 MED ORDER — SODIUM CHLORIDE 0.9 % IV SOLN
500.0000 mg | INTRAVENOUS | Status: DC
  Administered 2023-08-28: 500 mg via INTRAVENOUS
  Filled 2023-08-28: qty 5

## 2023-08-28 MED ORDER — SODIUM CHLORIDE 0.9 % IV SOLN: 2.0000 g | INTRAVENOUS | Status: DC

## 2023-08-28 MED ORDER — SODIUM CHLORIDE 0.9 % IV SOLN: 50.0000 mL | INTRAVENOUS | Status: DC

## 2023-08-28 MED ORDER — SODIUM CHLORIDE 0.9% FLUSH
3.0000 mL | Freq: Two times a day (BID) | INTRAVENOUS | Status: DC
  Administered 2023-08-28 – 2023-09-19 (×45): 3 mL via INTRAVENOUS

## 2023-08-28 MED ORDER — ACETAMINOPHEN 650 MG RE SUPP
650.0000 mg | RECTAL | Status: DC | PRN
  Administered 2023-09-18: 650 mg via RECTAL
  Filled 2023-08-28: qty 1

## 2023-08-28 MED ORDER — SODIUM CHLORIDE 0.9 % IV SOLN
500.0000 mg | INTRAVENOUS | Status: DC
  Filled 2023-08-28: qty 5

## 2023-08-28 NOTE — Progress Notes (Signed)
STAT LTM EEG hooked up and running - no initial skin breakdown - push button tested - [facility] monitoring. CT leads used

## 2023-08-28 NOTE — Procedures (Signed)
 Routine EEG Report  Christopher Murphy is a 78 y.o. male with a history of altered mental status who is undergoing an EEG to evaluate for seizures.  Report: This EEG was acquired with electrodes placed according to the International 10-20 electrode system (including Fp1, Fp2, F3, F4, C3, C4, P3, P4, O1, O2, T3, T4, T5, T6, A1, A2, Fz, Cz, Pz). The following electrodes were missing or displaced: none.  The occipital dominant rhythm was 7 Hz. This activity is reactive to stimulation. Drowsiness was manifested by background fragmentation; deeper stages of sleep were identified by K complexes and sleep spindles. There was focal slowing over the right hemisphere. There were no interictal epileptiform discharges. There were no electrographic seizures identified. Photic stimulation and hyperventilation were not performed.  Impression and clinical correlation: This EEG was obtained while awake and asleep and is abnormal due to: - mild diffuse slowing indicative of global cerebral dysfunction - superimposed R focal slowing indicating focal cerebral dysfunction in the right hemisphere  Epileptiform abnormalities were not seen during this recording.  Greg Leaks, MD [facility] [phone]  If 7pm- 7am, please page neurology on call as listed in AMION.

## 2023-08-28 NOTE — Evaluation (Signed)
 Occupational Therapy Evaluation Patient Details Name: Christopher Murphy MRN: [id number] DOB: 08-31-1945 Today's Date: 08/28/2023   History of Present Illness   Pt is a 78 year old male presented to the ED with worsening R sided deficits and confusion, admitted for stroke workup and for treatable causes that might be contributing to AMS.    PMH significant for dementia, prior CVA with right sided deficit,     Clinical Impressions Chart reviewed, pt greeted in room with daughter in law present. Pt's eyes remain closed for majority of session, follows simple commands approx 25% of the time. PTA pt DIL reports increasing assist for ADLs (see below) but pt was amb with SPC in the house and would go outside in the yard and amb as well. Pt presents with deficits in cognition, strength, endurance, activity tolerance, balance, affecting safe and optimal ADL completion. TOTAL A required for all ADLs with TOTAL A +2 required for bed mobility, MAX-TOTAL A +2 for STS (pt does initiate). Pt attempts to squeeze this therapist hand with L hand approx 3/4 trials. Pt is performing ADL/functional mobility below PLOF, will benefit from a trial of acute OT to address deficits and to facilitate optimal ADL engagement. Pt si left in bed, all needs met. OT will follow acutely.      If plan is discharge home, recommend the following:   Two people to help with walking and/or transfers;Two people to help with bathing/dressing/bathroom;Supervision due to cognitive status     Functional Status Assessment   Patient has had a recent decline in their functional status and demonstrates the ability to make significant improvements in function in a reasonable and predictable amount of time.     Equipment Recommendations   None recommended by OT;Other (comment) (pt has recommended equipment)     Recommendations for Other Services         Precautions/Restrictions   Precautions Precautions: Fall Recall of  Precautions/Restrictions: Impaired Restrictions Weight Bearing Restrictions Per Provider Order: No     Mobility Bed Mobility Overal bed mobility: Needs Assistance Bed Mobility: Supine to Sit, Sit to Supine, Rolling Rolling: Total assist, +2 for physical assistance, +2 for safety/equipment   Supine to sit: Total assist, +2 for physical assistance, +2 for safety/equipment Sit to supine: Total assist, +2 for physical assistance, +2 for safety/equipment        Transfers Overall transfer level: Needs assistance Equipment used: 2 person hand held assist Transfers: Sit to/from Stand Sit to Stand: Max -TOTAL assist, +2 physical assistance, +2 safety/equipment (pt does appear to initiate with STS but then requires MAX-TOTAL A +2 to maintain standing)                  Balance Overall balance assessment: Needs assistance Sitting-balance support: Feet supported Sitting balance-Leahy Scale: Poor     Standing balance support: Bilateral upper extremity supported Standing balance-Leahy Scale: Zero                             ADL either performed or assessed with clinical judgement   ADL Overall ADL's : Needs assistance/impaired                                       General ADL Comments: TOTAL A for all ADLs including grooming, LB bathing/toileting at bed level due to incontinent BM  Vision   Additional Comments: difficult to assess due to cognition, will continue to assess     Perception         Praxis         Pertinent Vitals/Pain Pain Assessment Pain Assessment: PAINAD Breathing: normal Negative Vocalization: none Facial Expression: smiling or inexpressive Body Language: relaxed Consolability: no need to console PAINAD Score: 0 Pain Intervention(s): Monitored during session     Extremity/Trunk Assessment Upper Extremity Assessment Upper Extremity Assessment: RUE deficits/detail;Difficult to assess due to impaired  cognition RUE Deficits / Details: baseline CVA deficits; pt will squeeze therapist hand with L hand 3/4 trials   Lower Extremity Assessment Lower Extremity Assessment: Difficult to assess due to impaired cognition       Communication Communication Communication: Impaired Factors Affecting Communication: Difficulty expressing self   Cognition Arousal: Stuporous   Cognition: History of cognitive impairments, Cognition impaired   Orientation impairments: Person, Place, Time, Situation Awareness: Intellectual awareness impaired, Online awareness impaired   Attention impairment (select first level of impairment): Focused attention Executive functioning impairment (select all impairments): Problem solving, Reasoning, Sequencing, Organization, Initiation OT - Cognition Comments: Pt eyes remain closed throughout most of evaluation despite multiple attempts/techniques to improve engagement. Two occasions with eye opening, (R eye) with position changes and at the end of session when he coughed                 Following commands: Impaired Following commands impaired: Follows one step commands inconsistently (approx 25% of the time)  DIL who works in the medical field requested to interpret    Cueing  General Comments   Cueing Techniques: Verbal cues;Gestural cues;Tactile cues;Visual cues  HR 80s bpm at rest and during mobility; spo2 >90% on RA throughout   Exercises Other Exercises Other Exercises: edu pt/DIL re: role of OT, role of rehab, delerium precautions   Shoulder Instructions      Home Living Family/patient expects to be discharged to:: Private residence Living Arrangements: Spouse/significant other;Children Available Help at Discharge: Family;Available 24 hours/day Type of Home: House Home Access: Ramped entrance     Home Layout: One level     Bathroom Shower/Tub: Producer, television/film/video: Standard Bathroom Accessibility: Yes   Home Equipment: Cane  - single Librarian, academic (2 wheels);BSC/3in1;Shower seat;Wheelchair - manual;Hospital bed          Prior Functioning/Environment Prior Level of Function : Needs assist             Mobility Comments: amb with cane household distances and in the yard ADLs Comments: family assists with bathing, dressing, toileting (pt is incontinent of urine, sometimes incontinent of BM), feeds himself; assist for all IADLs    OT Problem List: Decreased strength;Decreased activity tolerance;Decreased knowledge of use of DME or AE;Decreased safety awareness;Impaired balance (sitting and/or standing)   OT Treatment/Interventions: Self-care/ADL training;DME and/or AE instruction;Therapeutic activities;Balance training;Therapeutic exercise;Patient/family education;Energy conservation      OT Goals(Current goals can be found in the care plan section)   Acute Rehab OT Goals Patient Stated Goal: monitor patient OT Goal Formulation: With family Time For Goal Achievement: 09/11/23 Potential to Achieve Goals: Good ADL Goals Pt Will Perform Eating: with min assist Pt Will Perform Grooming: with min assist;sitting Pt Will Perform Lower Body Dressing: with mod assist;sitting/lateral leans Pt Will Transfer to Toilet: with min assist;ambulating   OT Frequency:  Min 2X/week    Co-evaluation PT/OT/SLP Co-Evaluation/Treatment: Yes Reason for Co-Treatment: For patient/therapist safety;To address functional/ADL transfers;Necessary to address cognition/behavior  during functional activity;Complexity of the patient's impairments (multi-system involvement)   OT goals addressed during session: ADL's and self-care      AM-PAC OT "6 Clicks" Daily Activity     Outcome Measure Help from another person eating meals?: Total Help from another person taking care of personal grooming?: Total Help from another person toileting, which includes using toliet, bedpan, or urinal?: Total Help from another person bathing  (including washing, rinsing, drying)?: Total Help from another person to put on and taking off regular upper body clothing?: Total Help from another person to put on and taking off regular lower body clothing?: Total 6 Click Score: 6   End of Session Equipment Utilized During Treatment: Gait belt  Activity Tolerance: Patient limited by lethargy Patient left: in bed;with call bell/phone within reach;with bed alarm set  OT Visit Diagnosis: Unsteadiness on feet (R26.81);Other abnormalities of gait and mobility (R26.89);Muscle weakness (generalized) (M62.81)                Time: 1010-1034 OT Time Calculation (min): 24 min Charges:  OT General Charges $OT Visit: 1 Visit OT Evaluation $OT Eval Moderate Complexity: 1 Mod  Gerre Kraft, OTD OTR/L  08/28/23, 12:08 PM

## 2023-08-28 NOTE — H&P (Signed)
 History and Physical    Christopher Murphy ZOX:[id number] DOB: 1945/05/11 DOA: 08/28/2023  PCP: Carollynn Cirri, NP   Patient coming from: Transfer from [facility]    Chief Complaint: AMS   HPI: History limited due to AMS  Christopher Murphy is a 78 y.o. male with hx of vascular dementia, prior CVA with right-sided hemiplegia, HLD, aortic aneurysm, who is transferred from [facility] for altered mental status and neurology recommendation for cEEG monitoring.   Patient is unarousable at time of interview. History provided by son and daughter in law at bedside. Note that he was in normal state of health until awakening yesterday morning.  Woke around 6:30 AM and noted to have slight more weakness on the right side.  However significantly worsened around 7 AM was unable to stand. Noted to have abnormal speech, per son speech was actually intelligible.  However he was unable to respond to any questioning.  Per daughter-in-law noted to have abnormal eye movement possibly right-sided gaze deviation of the left eye.  Had rapidly worsened on transfer to the ED and per family has been mostly unresponsive since arrival at the hospital.  Otherwise over the past month had had intermittent fever and cough, was prescribed a course of doxycycline  for this.  No other cold-like symptoms, abdominal pain, nausea, vomiting, diarrhea, dysuria, rashes.  No history of fall or head injury, headaches.  At his baseline he is ambulatory with the use of a cane has chronic right-sided hemiplegia.  Mostly dependent on ADLs although able to feed himself and partially close with.  Mostly disoriented although intermittent lucidity.   Review of Systems:  ROS limited per above   No Known Allergies  Prior to Admission medications   Medication Sig Start Date End Date Taking? Authorizing Provider  acetaminophen  (TYLENOL ) 650 MG suppository Place 1 suppository (650 mg total) rectally every 4 (four) hours as needed for mild pain (pain  score 1-3) (or temp > 37.5 C (99.5 F)). 08/28/23   Verla Glaze, MD  aspirin  300 MG suppository Place 1 suppository (300 mg total) rectally daily. 08/29/23   Verla Glaze, MD  cefTRIAXone 2 g in sodium chloride  0.9 % 100 mL Inject 2 g into the vein daily. 08/29/23   Verla Glaze, MD  enoxaparin  (LOVENOX ) 40 MG/0.4ML injection Inject 0.4 mLs (40 mg total) into the skin daily. 08/28/23   Verla Glaze, MD  Polyethyl Glycol-Propyl Glycol (SYSTANE) 0.4-0.3 % SOLN Place 1 drop into both eyes 2 (two) times daily. 08/28/23   Verla Glaze, MD  sodium chloride  0.9 % infusion Inject 50 mLs into the vein continuous. 08/28/23   Verla Glaze, MD    Past Medical History:  Diagnosis Date   Blood clots in brain 2009   Stroke [facility])     Past Surgical History:  Procedure Laterality Date   COLONOSCOPY WITH PROPOFOL  N/A 04/30/2019   Procedure: COLONOSCOPY WITH PROPOFOL ;  Surgeon: Luke Salaam, MD;  Location: [facility] ENDOSCOPY;  Service: Gastroenterology;  Laterality: N/A;   INTRAMEDULLARY (IM) NAIL INTERTROCHANTERIC Left 06/19/2022   Procedure: INTRAMEDULLARY (IM) NAIL INTERTROCHANTERIC;  Surgeon: Lorri Rota, MD;  Location: [facility] ORS;  Service: Orthopedics;  Laterality: Left;   PROSTATECTOMY  1980     reports that he has never smoked. He has never used smokeless tobacco. He reports that he does not drink alcohol  and does not use drugs.  Family History  Problem Relation Age of Onset   AAA (abdominal aortic aneurysm) Neg Hx      Physical Exam:  Vitals:   08/28/23 1928  SpO2: 98%    Gen: Obtunded, chronically ill appearing.  CV: Regular, normal S1, S2, no murmurs  Resp: Normal WOB, CTAB in anterior fields  Abd: Flat, normoactive, nontender MSK: Symmetric, no edema  Skin: No rashes or lesions to exposed skin  Neuro: Obtunded, grimaces to sternal rub, CN exam limited, PERRL, R facial droop. No movement on the R side. Withdrawals to pain on the left.  Psych: Unable to assess due to mental  status   Data review:   Labs reviewed, notable for:   Chemistries unremarkable  Na 134  Ammonia <13  Tox negative, alcohol  negative UA not consistent with infection WBC 11   Micro:  Results for orders placed or performed during the hospital encounter of 06/18/22  Resp panel by RT-PCR (RSV, Flu A&B, Covid) Anterior Nasal Swab     Status: None   Collection Time: 06/18/22 10:43 PM   Specimen: Anterior Nasal Swab  Result Value Ref Range Status   SARS Coronavirus 2 by RT PCR NEGATIVE NEGATIVE Final    Comment: (NOTE) SARS-CoV-2 target nucleic acids are NOT DETECTED.  The SARS-CoV-2 RNA is generally detectable in upper respiratory specimens during the acute phase of infection. The lowest concentration of SARS-CoV-2 viral copies this assay can detect is 138 copies/mL. A negative result does not preclude SARS-Cov-2 infection and should not be used as the sole basis for treatment or other patient management decisions. A negative result may occur with  improper specimen collection/handling, submission of specimen other than nasopharyngeal swab, presence of viral mutation(s) within the areas targeted by this assay, and inadequate number of viral copies(<138 copies/mL). A negative result must be combined with clinical observations, patient history, and epidemiological information. The expected result is Negative.  Fact Sheet for Patients:  BloggerCourse.com  Fact Sheet for Healthcare Providers:  SeriousBroker.it  This test is no t yet approved or cleared by the [place]  FDA and  has been authorized for detection and/or diagnosis of SARS-CoV-2 by FDA under an Emergency Use Authorization (EUA). This EUA will remain  in effect (meaning this test can be used) for the duration of the COVID-19 declaration under Section 564(b)(1) of the Act, 21 U.S.C.section 360bbb-3(b)(1), unless the authorization is terminated  or revoked sooner.        Influenza A by PCR NEGATIVE NEGATIVE Final   Influenza B by PCR NEGATIVE NEGATIVE Final    Comment: (NOTE) The Xpert Xpress SARS-CoV-2/FLU/RSV plus assay is intended as an aid in the diagnosis of influenza from Nasopharyngeal swab specimens and should not be used as a sole basis for treatment. Nasal washings and aspirates are unacceptable for Xpert Xpress SARS-CoV-2/FLU/RSV testing.  Fact Sheet for Patients: BloggerCourse.com  Fact Sheet for Healthcare Providers: SeriousBroker.it  This test is not yet approved or cleared by the [place]  FDA and has been authorized for detection and/or diagnosis of SARS-CoV-2 by FDA under an Emergency Use Authorization (EUA). This EUA will remain in effect (meaning this test can be used) for the duration of the COVID-19 declaration under Section 564(b)(1) of the Act, 21 U.S.C. section 360bbb-3(b)(1), unless the authorization is terminated or revoked.     Resp Syncytial Virus by PCR NEGATIVE NEGATIVE Final    Comment: (NOTE) Fact Sheet for Patients: BloggerCourse.com  Fact Sheet for Healthcare Providers: SeriousBroker.it  This test is not yet approved or cleared by the [place]  FDA and has been authorized for detection and/or diagnosis of SARS-CoV-2 by FDA under an Emergency Use  Authorization (EUA). This EUA will remain in effect (meaning this test can be used) for the duration of the COVID-19 declaration under Section 564(b)(1) of the Act, 21 U.S.C. section 360bbb-3(b)(1), unless the authorization is terminated or revoked.  Performed at [facility], [address] [place], [place] [address]     Imaging reviewed:  EEG adult Result Date: 08/28/2023 Eleni Griffin, MD     08/28/2023  2:25 PM Routine EEG Report Colum Jovani Flury is a 78 y.o. male with a history of altered mental status who is undergoing an  EEG to evaluate for seizures. Report: This EEG was acquired with electrodes placed according to the International 10-20 electrode system (including Fp1, Fp2, F3, F4, C3, C4, P3, P4, O1, O2, T3, T4, T5, T6, A1, A2, Fz, Cz, Pz). The following electrodes were missing or displaced: none. The occipital dominant rhythm was 7 Hz. This activity is reactive to stimulation. Drowsiness was manifested by background fragmentation; deeper stages of sleep were identified by K complexes and sleep spindles. There was focal slowing over the right hemisphere. There were no interictal epileptiform discharges. There were no electrographic seizures identified. Photic stimulation and hyperventilation were not performed. Impression and clinical correlation: This EEG was obtained while awake and asleep and is abnormal due to: - mild diffuse slowing indicative of global cerebral dysfunction - superimposed R focal slowing indicating focal cerebral dysfunction in the right hemisphere Epileptiform abnormalities were not seen during this recording. Greg Leaks, MD [facility] [phone] If 7pm- 7am, please page neurology on call as listed in AMION.   ECHOCARDIOGRAM COMPLETE Result Date: 08/28/2023    ECHOCARDIOGRAM REPORT   Patient Name:   [facility] Date of Exam: 08/28/2023 Medical Rec #:  [id number]             Height:       70.0 in Accession #:    [id number]            Weight:       150.1 lb Date of Birth:  26-Nov-1945             BSA:          1.848 m Patient Age:    77 years              BP:           146/84 mmHg Patient Gender: M                     HR:           75 bpm. Exam Location:  Inpatient Procedure: 2D Echo (Both Spectral and Color Flow Doppler were utilized during            procedure). Indications:     stroke  History:         Patient has prior history of Echocardiogram examinations, most                  recent 03/11/2022. Signs/Symptoms:Altered Mental Status.  Sonographer:     Dione Franks RDCS  Referring Phys:  [id number] Toney Francois [facility] Diagnosing Phys: Belva Boyden MD  Sonographer Comments: Image acquisition challenging due to uncooperative patient. IMPRESSIONS  1. Left ventricular ejection fraction, by estimation, is 60 to 65%. Left ventricular ejection fraction by PLAX is 61 %. The left ventricle has normal function. The left ventricle has no regional wall motion abnormalities. Left ventricular diastolic parameters are consistent with Grade I diastolic dysfunction (impaired relaxation).  2. Right ventricular systolic function is normal. The right ventricular size is normal. There is normal pulmonary artery systolic pressure. The estimated right ventricular systolic pressure is 27.8 mmHg.  3. The mitral valve is normal in structure. No evidence of mitral valve regurgitation. No evidence of mitral stenosis.  4. The aortic valve is normal in structure. Aortic valve regurgitation is not visualized. No aortic stenosis is present.  5. The inferior vena cava is normal in size with greater than 50% respiratory variability, suggesting right atrial pressure of 3 mmHg. FINDINGS  Left Ventricle: Left ventricular ejection fraction, by estimation, is 60 to 65%. Left ventricular ejection fraction by PLAX is 61 %. The left ventricle has normal function. The left ventricle has no regional wall motion abnormalities. Strain was performed and the global longitudinal strain is indeterminate. The left ventricular internal cavity size was normal in size. There is no left ventricular hypertrophy. Left ventricular diastolic parameters are consistent with Grade I diastolic dysfunction  (impaired relaxation). Right Ventricle: The right ventricular size is normal. No increase in right ventricular wall thickness. Right ventricular systolic function is normal. There is normal pulmonary artery systolic pressure. The tricuspid regurgitant velocity is 2.49 m/s, and  with an assumed right atrial pressure of 3 mmHg, the estimated right  ventricular systolic pressure is 27.8 mmHg. Left Atrium: Left atrial size was normal in size. Right Atrium: Right atrial size was normal in size. Pericardium: There is no evidence of pericardial effusion. Mitral Valve: The mitral valve is normal in structure. No evidence of mitral valve regurgitation. No evidence of mitral valve stenosis. Tricuspid Valve: The tricuspid valve is normal in structure. Tricuspid valve regurgitation is not demonstrated. No evidence of tricuspid stenosis. Aortic Valve: The aortic valve is normal in structure. Aortic valve regurgitation is not visualized. Aortic regurgitation PHT measures 606 msec. No aortic stenosis is present. Pulmonic Valve: The pulmonic valve was normal in structure. Pulmonic valve regurgitation is not visualized. No evidence of pulmonic stenosis. Aorta: The aortic root is normal in size and structure. Venous: The inferior vena cava is normal in size with greater than 50% respiratory variability, suggesting right atrial pressure of 3 mmHg. IAS/Shunts: No atrial level shunt detected by color flow Doppler. Additional Comments: 3D was performed not requiring image post processing on an independent workstation and was indeterminate.  LEFT VENTRICLE PLAX 2D LV EF:         Left            Diastology                ventricular     LV e' medial:    9.14 cm/s                ejection        LV E/e' medial:  8.1                fraction by     LV e' lateral:   11.10 cm/s                PLAX is 61      LV E/e' lateral: 6.7                %. LVIDd:         4.00 cm LVIDs:         2.70 cm LV PW:         0.90 cm LV IVS:        1.00 cm LVOT diam:  2.30 cm LV SV:         91 LV SV Index:   49 LVOT Area:     4.15 cm  RIGHT VENTRICLE             IVC RV Basal diam:  2.50 cm     IVC diam: 0.80 cm RV S prime:     18.10 cm/s TAPSE (M-mode): 2.4 cm LEFT ATRIUM             Index        RIGHT ATRIUM           Index LA Vol (A2C):   35.9 ml 19.43 ml/m  RA Area:     15.10 cm LA Vol (A4C):    39.8 ml 21.54 ml/m  RA Volume:   36.50 ml  19.75 ml/m LA Biplane Vol: 37.6 ml 20.35 ml/m  AORTIC VALVE LVOT Vmax:   115.00 cm/s LVOT Vmean:  78.350 cm/s LVOT VTI:    0.219 m AI PHT:      606 msec  AORTA Ao Root diam: 3.10 cm Ao Asc diam:  3.20 cm MITRAL VALVE               TRICUSPID VALVE MV Area (PHT): 3.31 cm    TR Peak grad:   24.8 mmHg MV Decel Time: 229 msec    TR Vmax:        249.00 cm/s MV E velocity: 73.90 cm/s MV A velocity: 74.90 cm/s  SHUNTS MV E/A ratio:  0.99        Systemic VTI:  0.22 m                            Systemic Diam: 2.30 cm Belva Boyden MD Electronically signed by Belva Boyden MD Signature Date/Time: 08/28/2023/11:40:16 AM    Final    MR BRAIN WO CONTRAST Result Date: 08/27/2023 CLINICAL DATA:  Initial evaluation for acute neuro deficit, stroke suspected. EXAM: MRI HEAD WITHOUT CONTRAST TECHNIQUE: Multiplanar, multiecho pulse sequences of the brain and surrounding structures were obtained without intravenous contrast. COMPARISON:  CT from earlier the same day as well as previous exams. FINDINGS: Brain: Diffuse prominence of the CSF containing spaces compatible generalized cerebral atrophy, advanced in nature. Extensive confluent T2/FLAIR hyperintensity involving the periventricular, deep, and subcortical white matter of both cerebral hemispheres as well as the pons and cerebellum, most likely related to severe chronic microvascular ischemic disease, and similar to prior. Encephalomalacia and gliosis involving the left occipital lobe consistent with a chronic left PCA distribution infarct. Involvement of the left thalamus noted. Evidence for wallerian degeneration at the left cerebral peduncle. Irregular diffusion signal abnormality involving the subcortical aspects of the right greater than left cerebral hemispheres again seen. Overall appearance is somewhat unusual in appearance, but relatively stable as compared to multiple previous exams. Finding is of uncertain etiology, and  could be related to underlying severe white matter disease with T2 shine through. Possible changes of seizure could also be considered. No convincing new foci of diffusion signal abnormality to suggest acute or subacute infarct. No acute or chronic intracranial blood products. No mass lesion, midline shift or mass effect. Diffuse ventricular prominence related to global parenchymal volume loss without hydrocephalus. No extra-axial fluid collection. Pituitary gland within normal limits. Vascular: Intracranial arterial circulation is somewhat dolichoectatic in appearance. Loss of normal flow void within the intradural right V4 segment, corresponding with abnormality on prior CTA. Major intracranial vascular  flow voids are otherwise maintained. Skull and upper cervical spine: Craniocervical junction within normal limits. Bone marrow signal intensity normal. No scalp soft tissue abnormality. Sinuses/Orbits: Right gaze preference noted. Prior bilateral ocular lens replacement. Paranasal sinuses are largely clear. Trace right mastoid effusion, of doubtful significance. Other: None. IMPRESSION: 1. Advanced cerebral atrophy with severe white matter disease, nonspecific. While these findings could be related to severe chronic microvascular ischemic disease, the degree of white matter changes are markedly advanced, with a possible adult onset leukodystrophy/leukoencephalopathy also a consideration. 2. Irregular diffusion signal abnormality involving the subcortical aspects of the right greater than left cerebral hemispheres, similar to previous exams. Finding is of uncertain etiology, and could be related to the underlying severe chronic white matter disease. Possible superimposed changes of acute/recent seizure could also be considered. 3. No other acute intracranial abnormality. 4. Chronic left PCA distribution infarct. Electronically Signed   By: Virgia Griffins M.D.   On: 08/27/2023 19:40   DG Chest Portable 1  View Result Date: 08/27/2023 CLINICAL DATA:  Altered mental status. Recent cough. Evaluate for pneumonia. EXAM: PORTABLE CHEST 1 VIEW COMPARISON:  06/18/2022 FINDINGS: Both lungs are clear. Slightly decreased lung volumes. Heart and mediastinum are within normal limits. Trachea is midline. Negative for a pneumothorax. No acute bone abnormality. IMPRESSION: No active disease. Electronically Signed   By: Elene Griffes M.D.   On: 08/27/2023 15:21   CT HEAD WO CONTRAST Result Date: 08/27/2023 CLINICAL DATA:  Provided history: Possible stroke, at of window. EXAM: CT HEAD WITHOUT CONTRAST TECHNIQUE: Contiguous axial images were obtained from the base of the skull through the vertex without intravenous contrast. RADIATION DOSE REDUCTION: This exam was performed according to the departmental dose-optimization program which includes automated exposure control, adjustment of the mA and/or kV according to patient size and/or use of iterative reconstruction technique. COMPARISON:  Head CT 06/18/2022. FINDINGS: Brain: Advanced generalized cerebral atrophy. Chronic cortical/subcortical infarct again demonstrated within the left occipital lobe. Chronic infarct again demonstrated within the left thalamus. Background advanced patchy and confluent hypoattenuation within the cerebral white matter, nonspecific but compatible with chronic small vessel ischemic disease. There is no acute intracranial hemorrhage. No acute demarcated cortical infarct. No extra-axial fluid collection. No evidence of an intracranial mass. No midline shift. Vascular: No hyperdense vessel. Atherosclerotic calcifications. Vertebrobasilar dolichoectasia. Skull: No calvarial fracture or aggressive osseous lesion. Sinuses/Orbits: No mass or acute finding within the imaged orbits. Opacification of a few left ethmoid air cells. No acute intracranial finding. These results were communicated to Dr. Doretta Gant At 2:05 pmon 5/20/2025by text page via the [facility] messaging  system. IMPRESSION: 1.  No acute intracranial finding. 2. Chronic cortical/subcortical infarct within the left occipital lobe (PCA vascular territory), unchanged. 3. Chronic infarct within the left thalamus, unchanged. 4. Background advanced cerebral atrophy and advanced cerebral white matter chronic small vessel ischemic disease. 5. Mild left ethmoid sinusitis. Electronically Signed   By: Bascom Lily D.O.   On: 08/27/2023 14:06   CT ANGIO HEAD NECK W WO CM W PERF (CODE STROKE) Result Date: 08/27/2023 CLINICAL DATA:  Neuro deficit, concern for stroke, confused, nonverbal. History of dementia. EXAM: CT ANGIOGRAPHY HEAD AND NECK CT PERFUSION BRAIN TECHNIQUE: Multidetector CT imaging of the head and neck was performed using the standard protocol during bolus administration of intravenous contrast. Multiplanar CT image reconstructions and MIPs were obtained to evaluate the vascular anatomy. Carotid stenosis measurements (when applicable) are obtained utilizing NASCET criteria, using the distal internal carotid diameter as the denominator. Multiphase CT  imaging of the brain was performed following IV bolus contrast injection. Subsequent parametric perfusion maps were calculated using RAPID software. RADIATION DOSE REDUCTION: This exam was performed according to the departmental dose-optimization program which includes automated exposure control, adjustment of the mA and/or kV according to patient size and/or use of iterative reconstruction technique. CONTRAST:  75mL OMNIPAQUE  IOHEXOL  350 MG/ML SOLN COMPARISON:  Same day head CT.  CTA head and neck 03/11/2022. FINDINGS: CTA NECK FINDINGS Aortic arch: Standard configuration of the aortic arch. Imaged portion shows no evidence of aneurysm or dissection. No significant stenosis of the major arch vessel origins. Pulmonary arteries: As permitted by contrast timing, there are no filling defects in the visualized pulmonary arteries. Subclavian arteries: The subclavian  arteries are patent bilaterally. Right carotid system: No evidence of dissection, stenosis (50% or greater), or occlusion. Left carotid system: No evidence of dissection, stenosis (50% or greater), or occlusion. Vertebral arteries: The left vertebral artery is dominant. Left vertebral artery is patent from the origin to the vertebrobasilar confluence. Slightly limited evaluation of the distal left V1 segment due to adjacent dense venous contrast. The non dominant right vertebral artery is patent from the origin to the vertebrobasilar confluence. There is a focus of irregularity of the vessel along the distal V3 and proximal V4 segment with a possible intraluminal soft tissue flap noted within the proximal V4 segment. There is significantly limited contrast within the right vertebral artery from the distal V3 segment to the vertebrobasilar confluence. Skeleton: No acute or aggressive finding noted. Other neck: The visualized airway is patent. No cervical lymphadenopathy. Upper chest: Visualized lung apices are clear. Review of the MIP images confirms the above findings CTA HEAD FINDINGS ANTERIOR CIRCULATION: The intracranial ICAs are patent bilaterally. Minimal atherosclerosis along the left carotid siphon without stenosis. No significant stenosis, proximal occlusion, aneurysm, or vascular malformation. MCAs: Patent bilaterally. Mild narrowing of a distal P2/proximal P3 segment of the right PCA. ACAs: The anterior cerebral arteries are patent bilaterally. 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 The left PCA is patent with diminished intraluminal contrast noted. The P1 and proximal P2 segments are normal in caliber. Distal branches are smaller in caliber without focal occlusion identified. Pcomm: Not well visualized. SCAs: The superior cerebellar arteries are patent bilaterally. Basilar artery: The basilar artery is patent. Diminished intraluminal contrast within the basilar  artery compared to the anterior circulation. AICAs: Not well visualized. PICAs: Visualized bilaterally. The left PICA is patent and demonstrates normal intraluminal contrast. Diminutive caliber of the left PICA with diminished intraluminal contrast although this appearance is similar to 2023. Vertebral arteries: As above. Venous sinuses: As permitted by contrast timing, patent. Anatomic variants: None Review of the MIP images confirms the above findings CT Brain Perfusion Findings: CBF (<30%) Volume: 0mL Perfusion (Tmax>6.0s) volume: Mismatch Volume: Infarction Location:No core infarct identified. Regions of elevated T-max within the cerebellum, primarily on the right. Additional elevated T-max within the right occipital lobe and posterior right temporal lobe. 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 Abnormal intraluminal soft tissue in the proximal V4 segment concerning for dissection flap. Nonocclusive thrombus is less likely. There is no evidence of occlusive thrombus. Additional diminished contrast within the basilar artery and right posterior cerebral artery. Diminished caliber of distal right PCA branches without focal occlusion identified, overall similar in caliber to 2023. No core infarct  identified on CT perfusion. Areas of elevated T-max in the cerebellum and right occipital lobe concerning for hypoperfusion. Diminished caliber of the right PCA appears similar to prior. Normal caliber of vasculature in the anterior circulation without occlusion, high-grade stenosis, dissection, or aneurysm. These results were called by telephone at the time of interpretation on 08/27/2023 at 1:14 pm to provider Dr. Doretta Gant, who verbally acknowledged these results. Electronically Signed   By: Denny Flack M.D.   On: 08/27/2023 13:34    EKG:    Personally reviewed EKG from 5/20.  Sinus rhythm, LAD, LAD, LVH H, no acute ischemic changes.  OSH Course:  Code stroke was activated at outside hospital ED due to declining mental status.  Underwent imaging including CT head, CTA head and neck, MRI brain as further described as above.  Spot EEG was negative for epileptiform activity but did have generalized slowing and focal slowing on the right indicating focal cerebral dysfunction.  Neurology recommended for transfer to [facility] for continuous EEG.  Otherwise was noted to have fever that started on empiric abx with ceftriaxone and azithromycin .   Assessment/Plan:  78 y.o. male with hx with hx of vascular dementia, prior CVA with right-sided hemiplegia, HLD, aortic aneurysm, who is transferred from [facility] for altered mental status and neurology recommendation for cEEG monitoring.   Encephalopathy, acute  LKWT 5/19 evening, per family awoke ~630 AM 5/20 with worsened R sided weakness, + nonsensical speech. Abnormal eye movement with ? R gaze deviation. Rapidly worse and ultimately aphasic and unresponsive. Code stroke at OSH ED, was not candidate for TNK or other intervention. CT Head with no acute abnormality, but advanced atrophy and chronic infarcts noted. CTA Head and neck with R vertebral artery dissection, no LVO.  CT perfusion with no core infarct but increased Tmax in cerebellum and right occipital area concerning for hypoperfusion.  MRI brain without contrast demonstrating irregular diffusion in the subcortical right greater than left cerebral hemisphere similar to prior, question related to white matter disease versus changes of recent seizure.  Spot EEG 5/21 negative for epileptiform activity but did have generalized slowing and focal slowing on the right indicating focal cerebral dysfunction. Was recommended for transfer to [facility] for cEEG.  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  Will evaluate for  additional etiologies per below. -Neurology following, plan for cEEG -Check VBG, B1, TSH, B12  -If there is no epileptiform activity on cEEG would consider LP -Started on high-dose thiamine, can discontinue if B1 normal -Maintain n.p.o. status, will need swallow screen once able -Maintenance IV fluid D5 NS at 50 cc an hour -Serial neurochecks  Right vertebral artery dissection - Per neuro recs continue DAPT x 90 days followed by Plavix  75 mg daily.  Currently unable to take p.o. - Will continue aspirin  300 mg suppository for now.  Fever with unknown source Tmax of 38.1C on 5/20.  WBC 11. per family only localizing symptom had been 1 month of intermittent cough and fevers. Thus far infectious evaluation unrevealing chest x-ray with no infiltrates, normal LFT, UA not consistent with infection.  There is mild ethmoid sinusitis noted on head CT. unclear if true underlying infection, is at risk of aspiration with his mental status change.  Otherwise question hyperpyrexia related to neurologic/seizure. -Repeat chest x-ray, check RVP, COVID. -Consider for LP, meningoencephalitis appears less likely -Change antibiotic to Unasyn  which will cover for aspiration/cap, sinusitis.   Chronic medical problems: Vascular dementia: On memantine , donepezil  at home.  Held  as he is NPO. History of CVA right-sided hemiplegia: On aspirin  PR per above.  Home rosuvastatin  held due to n.p.o. status. LDL 52, A1c 5.5% 3/'25  HLD: Statin held Aortic aneurysm: Noted  There is no height or weight on file to calculate BMI.    DVT prophylaxis:  SCDs Code Status:  DNR/DNI(Do NOT Intubate); confirmed with family  Diet:  Diet Orders (From admission, onward)     Start     Ordered   08/28/23 1930  Diet NPO time specified  Diet effective now        08/28/23 1931           Family Communication:  Yes discussed with son and daughter-in-law at bedside   Consults:  Neurology   Admission status:   Inpatient, Telemetry  bed  Severity of Illness: The appropriate patient status for this patient is INPATIENT. Inpatient status is judged to be reasonable and necessary in order to provide the required intensity of service to ensure the patient's safety. The patient's presenting symptoms, physical exam findings, and initial radiographic and laboratory data in the context of their chronic comorbidities is felt to place them at high risk for further clinical deterioration. Furthermore, it is not anticipated that the patient will be medically stable for discharge from the hospital within 2 midnights of admission.   * I certify that at the point of admission it is my clinical judgment that the patient will require inpatient hospital care spanning beyond 2 midnights from the point of admission due to high intensity of service, high risk for further deterioration and high frequency of surveillance required.*   Arnulfo Larch, MD [facility]  How to contact the [facility] Attending or Consulting provider 7A - 7P or covering provider during after hours 7P -7A, for this patient.  Check the care team in [facility] and look for a) attending/consulting [facility] provider listed and b) the [facility] team listed Log into [email or web address] and use [place]'s universal password to access. If you do not have the password, please contact the hospital operator. Locate the [facility] provider you are looking for under [facility] and page to a number that you can be directly reached. If you still have difficulty reaching the provider, please page the [facility] (Director on Call) for the Hospitalists listed on amion for assistance.  08/28/2023, 8:14 PM

## 2023-08-28 NOTE — Progress Notes (Signed)
 Eeg done

## 2023-08-28 NOTE — Assessment & Plan Note (Addendum)
 Patient unresponsive to verbal cues and stimulation but patient did move his left arm and leg this morning on his own.  Spot EEG negative for seizure.  Neurology recommended transfer to [facility] for continuous EEG monitoring to rule out status epilepticus.  MRI did not show any acute stroke.  Mention to family that everything will key off the patient's mental status.  If mental status does not improve may need to move to comfort care measures.

## 2023-08-28 NOTE — Assessment & Plan Note (Signed)
 Holding medications.

## 2023-08-28 NOTE — Assessment & Plan Note (Signed)
 Rectal aspirin  for now.  Unable to give oral medications.

## 2023-08-28 NOTE — Progress Notes (Signed)
  Echocardiogram 2D Echocardiogram has been performed.  Dione Franks 08/28/2023, 9:26 AM

## 2023-08-28 NOTE — Evaluation (Addendum)
 Clinical/Bedside Swallow Evaluation Patient Details  Name: Christopher Murphy MRN: [id number] Date of Birth: May 31, 1945  Today's Date: 08/28/2023 Time: SLP Start Time (ACUTE ONLY): 1345 SLP Stop Time (ACUTE ONLY): 1355 SLP Time Calculation (min) (ACUTE ONLY): 10 min  Past Medical History:  Past Medical History:  Diagnosis Date   Blood clots in brain 2009   Stroke [facility])    Past Surgical History:  Past Surgical History:  Procedure Laterality Date   COLONOSCOPY WITH PROPOFOL  N/A 04/30/2019   Procedure: COLONOSCOPY WITH PROPOFOL ;  Surgeon: Luke Salaam, MD;  Location: [facility] ENDOSCOPY;  Service: Gastroenterology;  Laterality: N/A;   INTRAMEDULLARY (IM) NAIL INTERTROCHANTERIC Left 06/19/2022   Procedure: INTRAMEDULLARY (IM) NAIL INTERTROCHANTERIC;  Surgeon: Lorri Rota, MD;  Location: [facility] ORS;  Service: Orthopedics;  Laterality: Left;   PROSTATECTOMY  1980   HPI:  Christopher Murphy is a 79 y.o. male with hx of dementia, prior CVA with right-sided deficits who was brought to the emergency department for speech impairment and altered mental status that was noticed by his family this morning.  Patient was last seen at recent baseline last night when he went to bed at 9:30 PM.  This morning he had no intelligible speech except for one sentence that he said approximately 9:30 AM requesting that his family bring him his prayer book.  While the sentence made sense his family stated that it did not make sense in the context of conversation.  He speaks [place] and his daughter who is a healthcare provider at bedside is translating.  He has not followed commands since yesterday and has had no intelligible speech other than that sentence.  Additional history obtained from Dr. Janett Medin his outpatient neurologist: pt has severe dementia at baseline with occasional episodes of speech impairment but usually resolving in an hour, able to ambulate sometimes with a cane but not safely, requires 24 hr supervision at  home.   MRI 08/27/2023 1. Advanced cerebral atrophy with severe white matter disease, nonspecific. While these findings could be related to severe chronic microvascular ischemic disease, the degree of white matter changes are markedly advanced, with a possible adult onset leukodystrophy/leukoencephalopathy also a consideration. 2. Irregular diffusion signal abnormality involving the subcortical aspects of the right greater than left cerebral hemispheres, similar to previous exams. Finding is of uncertain etiology, and could be related to the underlying severe chronic white matter disease. Possible superimposed changes of acute/recent seizure could also be considered. 3. No other acute intracranial abnormality. 4. Chronic left PCA distribution infarct.    Assessment / Plan / Recommendation  Clinical Impression  Pt with history of dysphagia and is known to ST services.   Pt continues to present with inability to arouse. Pt's DIL at bedside and provided verbal cues and commands. This writer provided sternal rub and cold washcloth to pt's face and shoulders. Pt's DIL applied some gentle pressure to pt's mouth and requested washcloth be placed at pt's lips in the hope of providing increased stimulation. Washcloth was placed at pt's lips with no response observed. Pt remained unable to open his eyes or move away from stimuli. At this time, pt's altered mentation places him at increased risk of aspiration with PO trials. Education provided to pt's DIL on keeping lights on and talking to pt to provide some stimulation. Recommend pt remain NPO. ST will follow. SLP Visit Diagnosis: Dysphagia, unspecified (R13.10)    Aspiration Risk  Severe aspiration risk;Risk for inadequate nutrition/hydration    Diet Recommendation NPO  Medication Administration: Via alternative means    Other  Recommendations Oral Care Recommendations: Oral care QID    Recommendations for follow up therapy are one  component of a multi-disciplinary discharge planning process, led by the attending physician.  Recommendations may be updated based on patient status, additional functional criteria and insurance authorization.  Follow up Recommendations  (Palliative Care consult)      Assistance Recommended at Discharge  TBD  Functional Status Assessment Patient has had a recent decline in their functional status and/or demonstrates limited ability to make significant improvements in function in a reasonable and predictable amount of time  Frequency and Duration min 2x/week  2 weeks       Prognosis Prognosis for improved oropharyngeal function: Guarded Barriers to Reach Goals: Cognitive deficits;Severity of deficits      Swallow Study   General Date of Onset: 08/27/23 HPI: Christopher Murphy is a 78 y.o. male with hx of dementia, prior CVA with right-sided deficits who was brought to the emergency department for speech impairment and altered mental status that was noticed by his family this morning.  Patient was last seen at recent baseline last night when he went to bed at 9:30 PM.  This morning he had no intelligible speech except for one sentence that he said approximately 9:30 AM requesting that his family bring him his prayer book.  While the sentence made sense his family stated that it did not make sense in the context of conversation.  He speaks [place] and his daughter who is a healthcare provider at bedside is translating.  He has not followed commands since yesterday and has had no intelligible speech other than that sentence.  Additional history obtained from Dr. Janett Medin his outpatient neurologist: pt has severe dementia at baseline with occasional episodes of speech impairment but usually resolving in an hour, able to ambulate sometimes with a cane but not safely, requires 24 hr supervision at home. Type of Study: Bedside Swallow Evaluation Previous Swallow Assessment: multiple including a MBSS  05/22/2019 Diet Prior to this Study: NPO Temperature Spikes Noted: No Respiratory Status: Room air History of Recent Intubation: No Behavior/Cognition: Lethargic/Drowsy    Oral/Motor/Sensory Function     Ice Chips Ice chips: Not tested   Thin Liquid Thin Liquid: Not tested    Nectar Thick Nectar Thick Liquid: Not tested   Honey Thick Honey Thick Liquid: Not tested   Puree Puree: Not tested   Solid     Solid: Not tested     Davyd Podgorski B. Garlin Junker, M.S., CCC-SLP, Tree surgeon Certified Brain Injury Specialist [facility]  [facility] Office [phone] Ascom [phone] Fax [phone]

## 2023-08-28 NOTE — Progress Notes (Signed)
 SLP Cancellation Note  Patient Details Name: Christopher Murphy MRN: [id number] DOB: 05-23-1945   Cancelled treatment:       Reason Eval/Treat Not Completed: Patient at procedure or test/unavailable  Pt having echo, spoke with pt's daughter to gather background and current information. Will re-attempt.   Christopher Murphy, M.S., CCC-SLP, CBIS Speech-Language Pathologist Certified Brain Injury Specialist [facility]  [facility] [phone] Ascom [phone] Fax [phone]  Christopher Murphy 08/28/2023, 9:20 AM

## 2023-08-28 NOTE — Plan of Care (Signed)

## 2023-08-28 NOTE — Discharge Summary (Signed)
 Physician Discharge Summary   Patient: Christopher Murphy MRN: [id number] DOB: Jul 02, 1945  Admit date:     08/27/2023  Discharge date: 08/28/23  Discharge Physician: Verla Glaze   PCP: Carollynn Cirri, NP   Recommendations at discharge:   Discharge to [facility] when bed available Continuous EEG monitoring needed  Discharge Diagnoses: Principal Problem:   Acute metabolic encephalopathy Active Problems:   History of CVA (cerebrovascular accident)   Vascular dementia (HCC)   Fever   Hemiparesis affecting right side as late effect of cerebrovascular accident (CVA) (HCC)   DNR (do not resuscitate)   CVA (cerebral vascular accident) (HCC)   Dementia without behavioral disturbance (HCC)  Resolved Problems:   * No resolved hospital problems. *  Hospital Course: 79 y.o. male with medical history significant of dementia, prior CVA with right sided deficit,  presents to the emergency department for evaluation of worsening right-sided deficits and confusion that started earlier this morning.  He is usually conversational and ambulatory per family and was at his baseline last night.  This morning, patient could not get himself out of bed and was speaking  nonsensically, prompting family to bring him in for evaluation.  similar episodes reported on most recent neurology follow-up note. While in the emergency department, he continued to decline to the point he is no longer responsive or following commands. code stroke was activated.  CT head (obtained prior to activation of code stroke) revealed extensive chronic changes.  Admission was requested for further evaluation and management.   Per his son, patient is a DNR and would not want any life-prolonging measures such as feeding tube.  MRI of the brain showed advanced cerebral atrophy with severe white matter disease, nonspecific.  Irregular diffusion signal abnormality involving the subcortical aspects of the right greater and  left cerebral hemispheres similar to previous exams, superimposed changes of acute/recent seizure could also be considered.  Chronic left PCA distribution infarct.  CT angiogram shows irregularity of the right vertebral artery at the distal V3 segment, abnormal intraluminal soft tissue in the proximal V4 segment which could be concerning for dissection flap, nonocclusive thrombus less likely.  Diminished caliber of distal right PCA branches without focal occlusion identified similar to 2003 areas of elevated Tmax in the cerebellum and right occipital lobe concerning for hypoperfusion.  5/21.  Patient unresponsive this morning but was able to move his left side on his own but not to command.  Patient had a fever so I started empiric Zithromax  and Rocephin.  Case discussed with neurology.  Spot EEG was negative for seizure but did showed slowing and right focal slowing indicating a focal cerebral dysfunction in the right hemisphere.  Upon reevaluation patient still unresponsive.  Neurology recommended transfer to [facility] for continuous EEG monitoring.  Echocardiogram shows an EF of 60%    Assessment and Plan: * Acute metabolic encephalopathy Patient unresponsive to verbal cues and stimulation but patient did move his left arm and leg this morning on his own.  Spot EEG negative for seizure.  Neurology recommended transfer to [facility] for continuous EEG monitoring to rule out status epilepticus.  MRI did not show any acute stroke.  Mention to family that everything will key off the patient's mental status.  If mental status does not improve may need to move to comfort care measures.  History of CVA (cerebrovascular accident) Rectal aspirin  for now.  Unable to give oral medications.  Fever Empiric Rocephin and Zithromax  prescribed.  For some reason I am unable to prescribe Zithromax  upon transfer.  Hemiparesis affecting right side as late effect of cerebrovascular accident (CVA)  (HCC) Chronic right-sided weakness from prior stroke  DNR (do not resuscitate) Patient is a DO NOT RESUSCITATE.  Dementia without behavioral disturbance (HCC) Holding medications.         Consultants: Neurology, speech therapy, physical therapy Procedures performed: None Disposition: [facility] Diet recommendation:  N.p.o. DISCHARGE MEDICATION: Allergies as of 08/28/2023   No Known Allergies      Medication List     STOP taking these medications    albuterol  108 (90 Base) MCG/ACT inhaler Commonly known as: VENTOLIN  HFA   aspirin  EC 81 MG tablet Commonly known as: Aspirin  Low Dose Replaced by: aspirin  300 MG suppository   docusate sodium  100 MG capsule Commonly known as: COLACE   donepezil  10 MG tablet Commonly known as: ARICEPT    fluticasone  50 MCG/ACT nasal spray Commonly known as: FLONASE    folic acid  1 MG tablet Commonly known as: FOLVITE    memantine  10 MG tablet Commonly known as: NAMENDA    multivitamin with minerals Tabs tablet   pantoprazole  40 MG tablet Commonly known as: PROTONIX    polyethylene glycol 17 g packet Commonly known as: MIRALAX  / GLYCOLAX    rosuvastatin  20 MG tablet Commonly known as: CRESTOR    traZODone  50 MG tablet Commonly known as: DESYREL    triamcinolone  cream 0.1 % Commonly known as: KENALOG        TAKE these medications    acetaminophen  650 MG suppository Commonly known as: TYLENOL  Place 1 suppository (650 mg total) rectally every 4 (four) hours as needed for mild pain (pain score 1-3) (or temp > 37.5 C (99.5 F)).   aspirin  300 MG suppository Place 1 suppository (300 mg total) rectally daily. Start taking on: Aug 29, 2023 Replaces: aspirin  EC 81 MG tablet   cefTRIAXone 2 g in sodium chloride  0.9 % 100 mL Inject 2 g into the vein daily. Start taking on: Aug 29, 2023   enoxaparin  40 MG/0.4ML injection Commonly known as: LOVENOX  Inject 0.4 mLs (40 mg total) into the skin daily.   sodium chloride   0.9 % infusion Inject 50 mLs into the vein continuous.   Systane 0.4-0.3 % Soln Generic drug: Polyethyl Glycol-Propyl Glycol Place 1 drop into both eyes 2 (two) times daily.        Follow-up Information     Carollynn Cirri, NP Follow up.   Specialties: Internal Medicine, Emergency Medicine Why: Hospital follow up Contact information: [address] [place] [address] [phone]                Discharge Exam: Cleavon Curls Weights   08/27/23 2000  Weight: 68.1 kg   Physical Exam HENT:     Head: Normocephalic.     Mouth/Throat:     Pharynx: No oropharyngeal exudate.  Eyes:     General: Lids are normal.     Comments: Pupils fixed  Cardiovascular:     Rate and Rhythm: Normal rate and regular rhythm.     Heart sounds: Normal heart sounds, S1 normal and S2 normal.  Pulmonary:     Breath sounds: Examination of the right-lower field reveals decreased breath sounds. Examination of the left-lower field reveals decreased breath sounds. Decreased breath sounds present. No wheezing, rhonchi or rales.  Abdominal:     Palpations: Abdomen is soft.     Tenderness: There is no abdominal tenderness.  Musculoskeletal:     Right lower  leg: No swelling.     Left lower leg: No swelling.  Skin:    General: Skin is warm.     Findings: No rash.  Neurological:     Comments: Patient unresponsive to verbal and physical stimuli.  I did witness him moving his left arm and left leg.      Condition at discharge: serious  The results of significant diagnostics from this hospitalization (including imaging, microbiology, ancillary and laboratory) are listed below for reference.   Imaging Studies: EEG adult Result Date: 08/28/2023 Eleni Griffin, MD     08/28/2023  2:25 PM Routine EEG Report Cleveland Pelham Hennick is a 78 y.o. male with a history of altered mental status who is undergoing an EEG to evaluate for seizures. Report: This EEG was acquired with electrodes placed according  to the International 10-20 electrode system (including Fp1, Fp2, F3, F4, C3, C4, P3, P4, O1, O2, T3, T4, T5, T6, A1, A2, Fz, Cz, Pz). The following electrodes were missing or displaced: none. The occipital dominant rhythm was 7 Hz. This activity is reactive to stimulation. Drowsiness was manifested by background fragmentation; deeper stages of sleep were identified by K complexes and sleep spindles. There was focal slowing over the right hemisphere. There were no interictal epileptiform discharges. There were no electrographic seizures identified. Photic stimulation and hyperventilation were not performed. Impression and clinical correlation: This EEG was obtained while awake and asleep and is abnormal due to: - mild diffuse slowing indicative of global cerebral dysfunction - superimposed R focal slowing indicating focal cerebral dysfunction in the right hemisphere Epileptiform abnormalities were not seen during this recording. Greg Leaks, MD [facility] [phone] If 7pm- 7am, please page neurology on call as listed in AMION.   ECHOCARDIOGRAM COMPLETE Result Date: 08/28/2023    ECHOCARDIOGRAM REPORT   Patient Name:   [facility] Date of Exam: 08/28/2023 Medical Rec #:  [id number]             Height:       70.0 in Accession #:    [id number]            Weight:       150.1 lb Date of Birth:  27-Sep-1945             BSA:          1.848 m Patient Age:    77 years              BP:           146/84 mmHg Patient Gender: M                     HR:           75 bpm. Exam Location:  Inpatient Procedure: 2D Echo (Both Spectral and Color Flow Doppler were utilized during            procedure). Indications:     stroke  History:         Patient has prior history of Echocardiogram examinations, most                  recent 03/11/2022. Signs/Symptoms:Altered Mental Status.  Sonographer:     Dione Franks RDCS Referring Phys:  [id number] Toney Francois [facility] Diagnosing Phys: Belva Boyden MD  Sonographer  Comments: Image acquisition challenging due to uncooperative patient. IMPRESSIONS  1. Left ventricular ejection fraction, by estimation, is 60 to 65%. Left ventricular ejection fraction by PLAX is 61 %.  The left ventricle has normal function. The left ventricle has no regional wall motion abnormalities. Left ventricular diastolic parameters are consistent with Grade I diastolic dysfunction (impaired relaxation).  2. Right ventricular systolic function is normal. The right ventricular size is normal. There is normal pulmonary artery systolic pressure. The estimated right ventricular systolic pressure is 27.8 mmHg.  3. The mitral valve is normal in structure. No evidence of mitral valve regurgitation. No evidence of mitral stenosis.  4. The aortic valve is normal in structure. Aortic valve regurgitation is not visualized. No aortic stenosis is present.  5. The inferior vena cava is normal in size with greater than 50% respiratory variability, suggesting right atrial pressure of 3 mmHg. FINDINGS  Left Ventricle: Left ventricular ejection fraction, by estimation, is 60 to 65%. Left ventricular ejection fraction by PLAX is 61 %. The left ventricle has normal function. The left ventricle has no regional wall motion abnormalities. Strain was performed and the global longitudinal strain is indeterminate. The left ventricular internal cavity size was normal in size. There is no left ventricular hypertrophy. Left ventricular diastolic parameters are consistent with Grade I diastolic dysfunction  (impaired relaxation). Right Ventricle: The right ventricular size is normal. No increase in right ventricular wall thickness. Right ventricular systolic function is normal. There is normal pulmonary artery systolic pressure. The tricuspid regurgitant velocity is 2.49 m/s, and  with an assumed right atrial pressure of 3 mmHg, the estimated right ventricular systolic pressure is 27.8 mmHg. Left Atrium: Left atrial size was normal in  size. Right Atrium: Right atrial size was normal in size. Pericardium: There is no evidence of pericardial effusion. Mitral Valve: The mitral valve is normal in structure. No evidence of mitral valve regurgitation. No evidence of mitral valve stenosis. Tricuspid Valve: The tricuspid valve is normal in structure. Tricuspid valve regurgitation is not demonstrated. No evidence of tricuspid stenosis. Aortic Valve: The aortic valve is normal in structure. Aortic valve regurgitation is not visualized. Aortic regurgitation PHT measures 606 msec. No aortic stenosis is present. Pulmonic Valve: The pulmonic valve was normal in structure. Pulmonic valve regurgitation is not visualized. No evidence of pulmonic stenosis. Aorta: The aortic root is normal in size and structure. Venous: The inferior vena cava is normal in size with greater than 50% respiratory variability, suggesting right atrial pressure of 3 mmHg. IAS/Shunts: No atrial level shunt detected by color flow Doppler. Additional Comments: 3D was performed not requiring image post processing on an independent workstation and was indeterminate.  LEFT VENTRICLE PLAX 2D LV EF:         Left            Diastology                ventricular     LV e' medial:    9.14 cm/s                ejection        LV E/e' medial:  8.1                fraction by     LV e' lateral:   11.10 cm/s                PLAX is 61      LV E/e' lateral: 6.7                %. LVIDd:         4.00 cm LVIDs:  2.70 cm LV PW:         0.90 cm LV IVS:        1.00 cm LVOT diam:     2.30 cm LV SV:         91 LV SV Index:   49 LVOT Area:     4.15 cm  RIGHT VENTRICLE             IVC RV Basal diam:  2.50 cm     IVC diam: 0.80 cm RV S prime:     18.10 cm/s TAPSE (M-mode): 2.4 cm LEFT ATRIUM             Index        RIGHT ATRIUM           Index LA Vol (A2C):   35.9 ml 19.43 ml/m  RA Area:     15.10 cm LA Vol (A4C):   39.8 ml 21.54 ml/m  RA Volume:   36.50 ml  19.75 ml/m LA Biplane Vol: 37.6 ml 20.35 ml/m   AORTIC VALVE LVOT Vmax:   115.00 cm/s LVOT Vmean:  78.350 cm/s LVOT VTI:    0.219 m AI PHT:      606 msec  AORTA Ao Root diam: 3.10 cm Ao Asc diam:  3.20 cm MITRAL VALVE               TRICUSPID VALVE MV Area (PHT): 3.31 cm    TR Peak grad:   24.8 mmHg MV Decel Time: 229 msec    TR Vmax:        249.00 cm/s MV E velocity: 73.90 cm/s MV A velocity: 74.90 cm/s  SHUNTS MV E/A ratio:  0.99        Systemic VTI:  0.22 m                            Systemic Diam: 2.30 cm Belva Boyden MD Electronically signed by Belva Boyden MD Signature Date/Time: 08/28/2023/11:40:16 AM    Final    MR BRAIN WO CONTRAST Result Date: 08/27/2023 CLINICAL DATA:  Initial evaluation for acute neuro deficit, stroke suspected. EXAM: MRI HEAD WITHOUT CONTRAST TECHNIQUE: Multiplanar, multiecho pulse sequences of the brain and surrounding structures were obtained without intravenous contrast. COMPARISON:  CT from earlier the same day as well as previous exams. FINDINGS: Brain: Diffuse prominence of the CSF containing spaces compatible generalized cerebral atrophy, advanced in nature. Extensive confluent T2/FLAIR hyperintensity involving the periventricular, deep, and subcortical white matter of both cerebral hemispheres as well as the pons and cerebellum, most likely related to severe chronic microvascular ischemic disease, and similar to prior. Encephalomalacia and gliosis involving the left occipital lobe consistent with a chronic left PCA distribution infarct. Involvement of the left thalamus noted. Evidence for wallerian degeneration at the left cerebral peduncle. Irregular diffusion signal abnormality involving the subcortical aspects of the right greater than left cerebral hemispheres again seen. Overall appearance is somewhat unusual in appearance, but relatively stable as compared to multiple previous exams. Finding is of uncertain etiology, and could be related to underlying severe white matter disease with T2 shine through. Possible  changes of seizure could also be considered. No convincing new foci of diffusion signal abnormality to suggest acute or subacute infarct. No acute or chronic intracranial blood products. No mass lesion, midline shift or mass effect. Diffuse ventricular prominence related to global parenchymal volume loss without hydrocephalus. No extra-axial fluid collection. Pituitary gland within  normal limits. Vascular: Intracranial arterial circulation is somewhat dolichoectatic in appearance. Loss of normal flow void within the intradural right V4 segment, corresponding with abnormality on prior CTA. Major intracranial vascular flow voids are otherwise maintained. Skull and upper cervical spine: Craniocervical junction within normal limits. Bone marrow signal intensity normal. No scalp soft tissue abnormality. Sinuses/Orbits: Right gaze preference noted. Prior bilateral ocular lens replacement. Paranasal sinuses are largely clear. Trace right mastoid effusion, of doubtful significance. Other: None. IMPRESSION: 1. Advanced cerebral atrophy with severe white matter disease, nonspecific. While these findings could be related to severe chronic microvascular ischemic disease, the degree of white matter changes are markedly advanced, with a possible adult onset leukodystrophy/leukoencephalopathy also a consideration. 2. Irregular diffusion signal abnormality involving the subcortical aspects of the right greater than left cerebral hemispheres, similar to previous exams. Finding is of uncertain etiology, and could be related to the underlying severe chronic white matter disease. Possible superimposed changes of acute/recent seizure could also be considered. 3. No other acute intracranial abnormality. 4. Chronic left PCA distribution infarct. Electronically Signed   By: Virgia Griffins M.D.   On: 08/27/2023 19:40   DG Chest Portable 1 View Result Date: 08/27/2023 CLINICAL DATA:  Altered mental status. Recent cough. Evaluate for  pneumonia. EXAM: PORTABLE CHEST 1 VIEW COMPARISON:  06/18/2022 FINDINGS: Both lungs are clear. Slightly decreased lung volumes. Heart and mediastinum are within normal limits. Trachea is midline. Negative for a pneumothorax. No acute bone abnormality. IMPRESSION: No active disease. Electronically Signed   By: Elene Griffes M.D.   On: 08/27/2023 15:21   CT HEAD WO CONTRAST Result Date: 08/27/2023 CLINICAL DATA:  Provided history: Possible stroke, at of window. EXAM: CT HEAD WITHOUT CONTRAST TECHNIQUE: Contiguous axial images were obtained from the base of the skull through the vertex without intravenous contrast. RADIATION DOSE REDUCTION: This exam was performed according to the departmental dose-optimization program which includes automated exposure control, adjustment of the mA and/or kV according to patient size and/or use of iterative reconstruction technique. COMPARISON:  Head CT 06/18/2022. FINDINGS: Brain: Advanced generalized cerebral atrophy. Chronic cortical/subcortical infarct again demonstrated within the left occipital lobe. Chronic infarct again demonstrated within the left thalamus. Background advanced patchy and confluent hypoattenuation within the cerebral white matter, nonspecific but compatible with chronic small vessel ischemic disease. There is no acute intracranial hemorrhage. No acute demarcated cortical infarct. No extra-axial fluid collection. No evidence of an intracranial mass. No midline shift. Vascular: No hyperdense vessel. Atherosclerotic calcifications. Vertebrobasilar dolichoectasia. Skull: No calvarial fracture or aggressive osseous lesion. Sinuses/Orbits: No mass or acute finding within the imaged orbits. Opacification of a few left ethmoid air cells. No acute intracranial finding. These results were communicated to Dr. Doretta Gant At 2:05 pmon 5/20/2025by text page via the [facility] messaging system. IMPRESSION: 1.  No acute intracranial finding. 2. Chronic cortical/subcortical infarct  within the left occipital lobe (PCA vascular territory), unchanged. 3. Chronic infarct within the left thalamus, unchanged. 4. Background advanced cerebral atrophy and advanced cerebral white matter chronic small vessel ischemic disease. 5. Mild left ethmoid sinusitis. Electronically Signed   By: Bascom Lily D.O.   On: 08/27/2023 14:06   CT ANGIO HEAD NECK W WO CM W PERF (CODE STROKE) Result Date: 08/27/2023 CLINICAL DATA:  Neuro deficit, concern for stroke, confused, nonverbal. History of dementia. EXAM: CT ANGIOGRAPHY HEAD AND NECK CT PERFUSION BRAIN TECHNIQUE: Multidetector CT imaging of the head and neck was performed using the standard protocol during bolus administration of intravenous contrast. Multiplanar CT image  reconstructions and MIPs were obtained to evaluate the vascular anatomy. Carotid stenosis measurements (when applicable) are obtained utilizing NASCET criteria, using the distal internal carotid diameter as the denominator. Multiphase CT imaging of the brain was performed following IV bolus contrast injection. Subsequent parametric perfusion maps were calculated using RAPID software. RADIATION DOSE REDUCTION: This exam was performed according to the departmental dose-optimization program which includes automated exposure control, adjustment of the mA and/or kV according to patient size and/or use of iterative reconstruction technique. CONTRAST:  75mL OMNIPAQUE  IOHEXOL  350 MG/ML SOLN COMPARISON:  Same day head CT.  CTA head and neck 03/11/2022. FINDINGS: CTA NECK FINDINGS Aortic arch: Standard configuration of the aortic arch. Imaged portion shows no evidence of aneurysm or dissection. No significant stenosis of the major arch vessel origins. Pulmonary arteries: As permitted by contrast timing, there are no filling defects in the visualized pulmonary arteries. Subclavian arteries: The subclavian arteries are patent bilaterally. Right carotid system: No evidence of dissection, stenosis (50% or  greater), or occlusion. Left carotid system: No evidence of dissection, stenosis (50% or greater), or occlusion. Vertebral arteries: The left vertebral artery is dominant. Left vertebral artery is patent from the origin to the vertebrobasilar confluence. Slightly limited evaluation of the distal left V1 segment due to adjacent dense venous contrast. The non dominant right vertebral artery is patent from the origin to the vertebrobasilar confluence. There is a focus of irregularity of the vessel along the distal V3 and proximal V4 segment with a possible intraluminal soft tissue flap noted within the proximal V4 segment. There is significantly limited contrast within the right vertebral artery from the distal V3 segment to the vertebrobasilar confluence. Skeleton: No acute or aggressive finding noted. Other neck: The visualized airway is patent. No cervical lymphadenopathy. Upper chest: Visualized lung apices are clear. Review of the MIP images confirms the above findings CTA HEAD FINDINGS ANTERIOR CIRCULATION: The intracranial ICAs are patent bilaterally. Minimal atherosclerosis along the left carotid siphon without stenosis. No significant stenosis, proximal occlusion, aneurysm, or vascular malformation. MCAs: Patent bilaterally. Mild narrowing of a distal P2/proximal P3 segment of the right PCA. ACAs: The anterior cerebral arteries are patent bilaterally. 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 The left PCA is patent with diminished intraluminal contrast noted. The P1 and proximal P2 segments are normal in caliber. Distal branches are smaller in caliber without focal occlusion identified. Pcomm: Not well visualized. SCAs: The superior cerebellar arteries are patent bilaterally. Basilar artery: The basilar artery is patent. Diminished intraluminal contrast within the basilar artery compared to the anterior circulation. AICAs: Not well visualized. PICAs: Visualized bilaterally.  The left PICA is patent and demonstrates normal intraluminal contrast. Diminutive caliber of the left PICA with diminished intraluminal contrast although this appearance is similar to 2023. Vertebral arteries: As above. Venous sinuses: As permitted by contrast timing, patent. Anatomic variants: None Review of the MIP images confirms the above findings CT Brain Perfusion Findings: CBF (<30%) Volume: 0mL Perfusion (Tmax>6.0s) volume: Mismatch Volume: Infarction Location:No core infarct identified. Regions of elevated T-max within the cerebellum, primarily on the right. Additional elevated T-max within the right occipital lobe and posterior right temporal lobe. 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 Abnormal intraluminal soft tissue in the proximal V4 segment concerning for dissection flap. Nonocclusive thrombus is less likely. There is no evidence of occlusive thrombus. Additional  diminished contrast within the basilar artery and right posterior cerebral artery. Diminished caliber of distal right PCA branches without focal occlusion identified, overall similar in caliber to 2023. No core infarct identified on CT perfusion. Areas of elevated T-max in the cerebellum and right occipital lobe concerning for hypoperfusion. Diminished caliber of the right PCA appears similar to prior. Normal caliber of vasculature in the anterior circulation without occlusion, high-grade stenosis, dissection, or aneurysm. These results were called by telephone at the time of interpretation on 08/27/2023 at 1:14 pm to provider Dr. Doretta Gant, who verbally acknowledged these results. Electronically Signed   By: Denny Flack M.D.   On: 08/27/2023 13:34    Microbiology: Results for orders placed or performed during the hospital encounter of 06/18/22  Resp panel  by RT-PCR (RSV, Flu A&B, Covid) Anterior Nasal Swab     Status: None   Collection Time: 06/18/22 10:43 PM   Specimen: Anterior Nasal Swab  Result Value Ref Range Status   SARS Coronavirus 2 by RT PCR NEGATIVE NEGATIVE Final    Comment: (NOTE) SARS-CoV-2 target nucleic acids are NOT DETECTED.  The SARS-CoV-2 RNA is generally detectable in upper respiratory specimens during the acute phase of infection. The lowest concentration of SARS-CoV-2 viral copies this assay can detect is 138 copies/mL. A negative result does not preclude SARS-Cov-2 infection and should not be used as the sole basis for treatment or other patient management decisions. A negative result may occur with  improper specimen collection/handling, submission of specimen other than nasopharyngeal swab, presence of viral mutation(s) within the areas targeted by this assay, and inadequate number of viral copies(<138 copies/mL). A negative result must be combined with clinical observations, patient history, and epidemiological information. The expected result is Negative.  Fact Sheet for Patients:  BloggerCourse.com  Fact Sheet for Healthcare Providers:  SeriousBroker.it  This test is no t yet approved or cleared by the [place]  FDA and  has been authorized for detection and/or diagnosis of SARS-CoV-2 by FDA under an Emergency Use Authorization (EUA). This EUA will remain  in effect (meaning this test can be used) for the duration of the COVID-19 declaration under Section 564(b)(1) of the Act, 21 U.S.C.section 360bbb-3(b)(1), unless the authorization is terminated  or revoked sooner.       Influenza A by PCR NEGATIVE NEGATIVE Final   Influenza B by PCR NEGATIVE NEGATIVE Final    Comment: (NOTE) The Xpert Xpress SARS-CoV-2/FLU/RSV plus assay is intended as an aid in the diagnosis of influenza from Nasopharyngeal swab specimens and should not be used as a sole  basis for treatment. Nasal washings and aspirates are unacceptable for Xpert Xpress SARS-CoV-2/FLU/RSV testing.  Fact Sheet for Patients: BloggerCourse.com  Fact Sheet for Healthcare Providers: SeriousBroker.it  This test is not yet approved or cleared by the [place]  FDA and has been authorized for detection and/or diagnosis of SARS-CoV-2 by FDA under an Emergency Use Authorization (EUA). This EUA will remain in effect (meaning this test can be used) for the duration of the COVID-19 declaration under Section 564(b)(1) of the Act, 21 U.S.C. section 360bbb-3(b)(1), unless the authorization is terminated or revoked.     Resp Syncytial Virus by PCR NEGATIVE NEGATIVE Final    Comment: (NOTE) Fact Sheet for Patients: BloggerCourse.com  Fact Sheet for Healthcare Providers: SeriousBroker.it  This test is not yet approved or cleared by the [place]  FDA and has been authorized for detection and/or diagnosis of SARS-CoV-2 by FDA under an Emergency Use Authorization (EUA). This EUA  will remain in effect (meaning this test can be used) for the duration of the COVID-19 declaration under Section 564(b)(1) of the Act, 21 U.S.C. section 360bbb-3(b)(1), unless the authorization is terminated or revoked.  Performed at [facility], [address] [place], [place] [address]     Labs: CBC: Recent Labs  Lab 08/27/23 1049 08/27/23 1601  WBC 12.4* 11.1*  NEUTROABS 9.0*  --   HGB 12.9* 12.9*  HCT 40.5 39.2  MCV 81.2 80.0  PLT 308 290   Basic Metabolic Panel: Recent Labs  Lab 08/27/23 1049 08/27/23 1601  NA 134*  --   K 3.9  --   CL 101  --   CO2 25  --   GLUCOSE 116*  --   BUN 11  --   CREATININE 0.89 0.86  CALCIUM  8.9  --    Liver Function Tests: Recent Labs  Lab 08/27/23 1049  AST 19  ALT 13  ALKPHOS 66  BILITOT 0.8  PROT 7.6  ALBUMIN 3.6    CBG: Recent Labs  Lab 08/27/23 1039  GLUCAP 112*    Discharge time spent: greater than 30 minutes.  Signed: Verla Glaze, MD [facility] 08/28/2023

## 2023-08-28 NOTE — Assessment & Plan Note (Signed)
Patient is a DO NOT RESUSCITATE. ?

## 2023-08-28 NOTE — Hospital Course (Addendum)
 78 y.o. male with medical history significant of dementia, prior CVA with right sided deficit,  presents to the emergency department for evaluation of worsening right-sided deficits and confusion that started earlier this morning.  He is usually conversational and ambulatory per family and was at his baseline last night.  This morning, patient could not get himself out of bed and was speaking  nonsensically, prompting family to bring him in for evaluation.  similar episodes reported on most recent neurology follow-up note. While in the emergency department, he continued to decline to the point he is no longer responsive or following commands. code stroke was activated.  CT head (obtained prior to activation of code stroke) revealed extensive chronic changes.  Admission was requested for further evaluation and management.   Per his son, patient is a DNR and would not want any life-prolonging measures such as feeding tube.  MRI of the brain showed advanced cerebral atrophy with severe white matter disease, nonspecific.  Irregular diffusion signal abnormality involving the subcortical aspects of the right greater and left cerebral hemispheres similar to previous exams, superimposed changes of acute/recent seizure could also be considered.  Chronic left PCA distribution infarct.  CT angiogram shows irregularity of the right vertebral artery at the distal V3 segment, abnormal intraluminal soft tissue in the proximal V4 segment which could be concerning for dissection flap, nonocclusive thrombus less likely.  Diminished caliber of distal right PCA branches without focal occlusion identified similar to 2003 areas of elevated Tmax in the cerebellum and right occipital lobe concerning for hypoperfusion.  5/21.  Patient unresponsive this morning but was able to move his left side on his own but not to command.  Patient had a fever so I started empiric Zithromax  and Rocephin.  Case discussed with neurology.  Spot EEG  was negative for seizure but did showed slowing and right focal slowing indicating a focal cerebral dysfunction in the right hemisphere.  Upon reevaluation patient still unresponsive.  Neurology recommended transfer to [facility] for continuous EEG monitoring.  Echocardiogram shows an EF of 60%

## 2023-08-28 NOTE — Progress Notes (Signed)
 Neurology progress note  S: No change in patient's condition except a bit more lethargic today. Does not follow commands, no intelligible speech  O:  Exam  Gen: patient lying in bed, NAD CV: extremities appear well-perfused Resp: normal WOB   Neurologic exam MS: stupurous, arousable to loud voice, does not follow commands in Punjabi Speech: no intelligible speech per daughter at bedside CN: PERRL, blinks to threat bilat, EOMI, R facial droop Motor and sensory: withdraws in all extremities much less briskly on L, no movement antigravity in any extremity Reflexes: 2+ symm with toes down bilat Coordination: UTA Gait: deferred  Imaging  CT Head without contrast(Personally reviewed): No acute process   CT angio Head and Neck with contrast(Personally reviewed):   Irregularity of the right vertebral artery at the distal V3 segment with significantly diminished intraluminal contrast within the vessel from the distal V3 segment to the vertebrobasilar confluence. Abnormal intraluminal soft tissue in the proximal V4 segment concerning for dissection flap. Nonocclusive thrombus is less likely. There is no evidence of occlusive thrombus.   Additional diminished contrast within the basilar artery and right posterior cerebral artery. Diminished caliber of distal right PCA branches without focal occlusion identified, overall similar in caliber to 2023.   No core infarct identified on CT perfusion. Areas of elevated T-max in the cerebellum and right occipital lobe concerning for hypoperfusion.   Diminished caliber of the right PCA appears similar to prior.   Normal caliber of vasculature in the anterior circulation without occlusion, high-grade stenosis, dissection, or aneurysm.   On personal review and discussion with Dr. Janett Medin his vascular neurologist findings in R vertebral artery appear chronic  MRI brain wo 1. Advanced cerebral atrophy with severe white matter  disease, nonspecific. While these findings could be related to severe chronic microvascular ischemic disease, the degree of white matter changes are markedly advanced, with a possible adult onset leukodystrophy/leukoencephalopathy also a consideration. 2. Irregular diffusion signal abnormality involving the subcortical aspects of the right greater than left cerebral hemispheres, similar to previous exams. Finding is of uncertain etiology, and could be related to the underlying severe chronic white matter disease. Possible superimposed changes of acute/recent seizure could also be considered. 3. No other acute intracranial abnormality. 4. Chronic left PCA distribution infarct.  rEEG: mild diffuse slowing with superimposed R focal slowing  A/P: Christopher Murphy is a 78 y.o. male with hx of dementia, prior CVA with right-sided deficits who was brought to the emergency department for aphasia since 5/19 PM. He was not a candidate for TNK or intervention. MRI brain showed no acute infarct but did show irregular diffusion signal abnormality involving the subcortical aspects of the right greater than left cerebral hemispheres which is chronic and similar to previous imaging.  Possible superimposed changes of acute or recent seizure could also be considered.  He was not actively seizing on the 20-minute EEG performed today however given his profound altered mental status and minimal responsiveness and no treatable etiology identified thus far nonconvulsive seizures remain on the differential.  He is certainly at risk for seizure given his multiple cerebrovascular ischemic insults in the past.  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 Consider palliative care consult at [facility]  Greg Leaks, MD [facility] [phone]  If 7pm- 7am, please page neurology on call as listed in AMION.

## 2023-08-28 NOTE — Evaluation (Signed)
 Physical Therapy Evaluation Patient Details Name: Christopher Murphy MRN: [id number] DOB: 1946-02-07 Today's Date: 08/28/2023  History of Present Illness  Pt is a 78 year old male presented to the ED with worsening R sided deficits and confusion, admitted for stroke workup and for treatable causes that might be contributing to AMS.    PMH significant for dementia, prior CVA with right sided deficit,   Clinical Impression  Pt admitted with above diagnosis. Pt currently with functional limitations due to the deficits listed below (see PT Problem List). DIL at bedside assisting in subjective reports as pt unable to respond or communicate. PTA pt lives with family and ambulates mod-I with SPC in household and outside. Requires family assist with ADL's/IADL's.   To date, DIL agreeable to translate throughout session. Pt overall with limited responsiveness with mobility, LT, use of bright lights, voice, and mobility. Unable to truly assess UE and LE use, minimal command following with hand squeezing R and L. Pt totalA+2 for bed mobility, maxA+1 to 2 for static sitting and maxA+2 to stand HHA initially helping extend through hips/knees but once standing reliant on totalA+2. Noted BM on chuck pad thus returned to supine with R/L rolling totalA+2 for pericare and chuck pad change. Pt positioned in R quarter turn for skin integrity using pillows. Will trial PT and follow POC pending GOC and medical f/u. Update recs as appropriate pending prognosis. Pt left with all needs in reach with DIL at bedside.      If plan is discharge home, recommend the following: Two people to help with walking and/or transfers;Two people to help with bathing/dressing/bathroom;Assistance with feeding;Assistance with cooking/housework;Direct supervision/assist for medications management;Assist for transportation;Supervision due to cognitive status;Direct supervision/assist for financial management;Help with stairs or ramp for  entrance   Can travel by private vehicle        Equipment Recommendations Other (comment) (TBD)  Recommendations for Other Services       Functional Status Assessment Patient has had a recent decline in their functional status and/or demonstrates limited ability to make significant improvements in function in a reasonable and predictable amount of time     Precautions / Restrictions Precautions Precautions: Fall Recall of Precautions/Restrictions: Impaired Restrictions Weight Bearing Restrictions Per Provider Order: No      Mobility  Bed Mobility Overal bed mobility: Needs Assistance Bed Mobility: Supine to Sit, Sit to Supine, Rolling Rolling: Total assist, +2 for physical assistance, +2 for safety/equipment   Supine to sit: Total assist, +2 for physical assistance, +2 for safety/equipment Sit to supine: Total assist, +2 for physical assistance, +2 for safety/equipment        Transfers Overall transfer level: Needs assistance Equipment used: 2 person hand held assist Transfers: Sit to/from Stand Sit to Stand: Max assist, +2 physical assistance, +2 safety/equipment, Total assist           General transfer comment: once standing, requires totalA+2    Ambulation/Gait                  Stairs            Wheelchair Mobility     Tilt Bed    Modified Rankin (Stroke Patients Only)       Balance Overall balance assessment: Needs assistance Sitting-balance support: Feet supported Sitting balance-Leahy Scale: Poor Sitting balance - Comments: L lateral lean and posterior lean Postural control: Left lateral lean, Posterior lean Standing balance support: Bilateral upper extremity supported Standing balance-Leahy Scale: Zero  Pertinent Vitals/Pain Pain Assessment Pain Assessment: Faces Faces Pain Scale: No hurt    Home Living Family/patient expects to be discharged to:: Private residence Living  Arrangements: Spouse/significant other;Children Available Help at Discharge: Family;Available 24 hours/day Type of Home: House Home Access: Ramped entrance       Home Layout: One level Home Equipment: None      Prior Function Prior Level of Function : Needs assist             Mobility Comments: amb with cane household distances and in the yard ADLs Comments: family assists with bathing, dressing, toileting (pt is incontinent of urine, sometimes incontinent of BM), feeds himself; assist for all IADLs     Extremity/Trunk Assessment   Upper Extremity Assessment Upper Extremity Assessment: RUE deficits/detail;Difficult to assess due to impaired cognition RUE Deficits / Details: baseline CVA deficits; pt will squeeze therapist hand with L hand 3/4 trials    Lower Extremity Assessment Lower Extremity Assessment: Difficult to assess due to impaired cognition (Does mobilize LLE on occasion on his own accord throughout session)       Communication   Communication Communication: Impaired Factors Affecting Communication: Difficulty expressing self    Cognition Arousal: Stuporous                               Following commands: Impaired Following commands impaired: Follows one step commands inconsistently     Cueing Cueing Techniques: Verbal cues, Gestural cues, Tactile cues, Visual cues     General Comments General comments (skin integrity, edema, etc.): HR 80s bpm at rest and during mobility; spo2 >90% on RA throughout    Exercises     Assessment/Plan    PT Assessment Patient needs continued PT services (trial of PT)  PT Problem List Decreased strength;Decreased mobility;Decreased activity tolerance;Decreased balance;Decreased cognition       PT Treatment Interventions Gait training;Balance training;Therapeutic exercise;Functional mobility training;Therapeutic activities;Patient/family education;Neuromuscular re-education    PT Goals (Current goals  can be found in the Care Plan section)  Acute Rehab PT Goals PT Goal Formulation: Patient unable to participate in goal setting Time For Goal Achievement: 09/11/23 Potential to Achieve Goals: Poor    Frequency Min 2X/week     Co-evaluation   Reason for Co-Treatment: For patient/therapist safety;To address functional/ADL transfers;Necessary to address cognition/behavior during functional activity;Complexity of the patient's impairments (multi-system involvement)   OT goals addressed during session: ADL's and self-care       AM-PAC PT "6 Clicks" Mobility  Outcome Measure Help needed turning from your back to your side while in a flat bed without using bedrails?: Total Help needed moving from lying on your back to sitting on the side of a flat bed without using bedrails?: Total Help needed moving to and from a bed to a chair (including a wheelchair)?: Total Help needed standing up from a chair using your arms (e.g., wheelchair or bedside chair)?: Total Help needed to walk in hospital room?: Total Help needed climbing 3-5 steps with a railing? 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 Fairly IV, PT, DPT Physical Therapist- [facility]  08/28/2023, 12:23 PM

## 2023-08-28 NOTE — Assessment & Plan Note (Signed)
 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

## 2023-08-28 NOTE — Assessment & Plan Note (Signed)
 Empiric Rocephin and Zithromax  prescribed.  For some reason I am unable to prescribe Zithromax  upon transfer.

## 2023-08-28 NOTE — Plan of Care (Signed)
 Problem: Education: Goal: Knowledge of disease or condition will improve 08/28/2023 0354 by Orene Binning, RN Outcome: Progressing 08/28/2023 0354 by Orene Binning, RN Outcome: Progressing Goal: Knowledge of secondary prevention will improve (MUST DOCUMENT ALL) 08/28/2023 0354 by Orene Binning, RN Outcome: Progressing 08/28/2023 0354 by Orene Binning, RN Outcome: Progressing Goal: Knowledge of patient specific risk factors will improve (DELETE if not current risk factor) 08/28/2023 0354 by Orene Binning, RN Outcome: Progressing 08/28/2023 0354 by Orene Binning, RN Outcome: Progressing   Problem: Ischemic Stroke/TIA Tissue Perfusion: Goal: Complications of ischemic stroke/TIA will be minimized 08/28/2023 0354 by Orene Binning, RN Outcome: Progressing 08/28/2023 0354 by Orene Binning, RN Outcome: Progressing   Problem: Coping: Goal: Will verbalize positive feelings about self 08/28/2023 0354 by Orene Binning, RN Outcome: Progressing 08/28/2023 0354 by Orene Binning, RN Outcome: Progressing Goal: Will identify appropriate support needs 08/28/2023 0354 by Orene Binning, RN Outcome: Progressing 08/28/2023 0354 by Orene Binning, RN Outcome: Progressing   Problem: Health Behavior/Discharge Planning: Goal: Ability to manage health-related needs will improve 08/28/2023 0354 by Orene Binning, RN Outcome: Progressing 08/28/2023 0354 by Orene Binning, RN Outcome: Progressing Goal: Goals will be collaboratively established with patient/family 08/28/2023 0354 by Orene Binning, RN Outcome: Progressing 08/28/2023 0354 by Orene Binning, RN Outcome: Progressing   Problem: Self-Care: Goal: Ability to participate in self-care as condition permits will improve 08/28/2023 0354 by Orene Binning, RN Outcome: Progressing 08/28/2023 0354 by Orene Binning, RN Outcome: Progressing Goal: Verbalization of feelings and concerns over difficulty with self-care will improve 08/28/2023 0354 by  Orene Binning, RN Outcome: Progressing 08/28/2023 0354 by Orene Binning, RN Outcome: Progressing Goal: Ability to communicate needs accurately will improve 08/28/2023 0354 by Orene Binning, RN Outcome: Progressing 08/28/2023 0354 by Orene Binning, RN Outcome: Progressing   Problem: Nutrition: Goal: Risk of aspiration will decrease 08/28/2023 0354 by Orene Binning, RN Outcome: Progressing 08/28/2023 0354 by Orene Binning, RN Outcome: Progressing Goal: Dietary intake will improve 08/28/2023 0354 by Orene Binning, RN Outcome: Progressing 08/28/2023 0354 by Orene Binning, RN Outcome: Progressing   Problem: Education: Goal: Knowledge of General Education information will improve Description: Including pain rating scale, medication(s)/side effects and non-pharmacologic comfort measures 08/28/2023 0354 by Orene Binning, RN Outcome: Progressing 08/28/2023 0354 by Orene Binning, RN Outcome: Progressing   Problem: Health Behavior/Discharge Planning: Goal: Ability to manage health-related needs will improve 08/28/2023 0354 by Orene Binning, RN Outcome: Progressing 08/28/2023 0354 by Orene Binning, RN Outcome: Progressing   Problem: Clinical Measurements: Goal: Ability to maintain clinical measurements within normal limits will improve 08/28/2023 0354 by Orene Binning, RN Outcome: Progressing 08/28/2023 0354 by Orene Binning, RN Outcome: Progressing Goal: Will remain free from infection 08/28/2023 0354 by Orene Binning, RN Outcome: Progressing 08/28/2023 0354 by Orene Binning, RN Outcome: Progressing Goal: Diagnostic test results will improve 08/28/2023 0354 by Orene Binning, RN Outcome: Progressing 08/28/2023 0354 by Orene Binning, RN Outcome: Progressing Goal: Respiratory complications will improve 08/28/2023 0354 by Orene Binning, RN Outcome: Progressing 08/28/2023 0354 by Orene Binning, RN Outcome: Progressing Goal: Cardiovascular complication will be  avoided 08/28/2023 0354 by Orene Binning, RN Outcome: Progressing 08/28/2023 0354 by Orene Binning, RN Outcome: Progressing   Problem: Activity: Goal: Risk for activity intolerance will decrease 08/28/2023 0354 by Orene Binning, RN Outcome: Progressing 08/28/2023 0354 by  Orene Binning, RN Outcome: Progressing   Problem: Nutrition: Goal: Adequate nutrition will be maintained 08/28/2023 0354 by Orene Binning, RN Outcome: Progressing 08/28/2023 0354 by Orene Binning, RN Outcome: Progressing   Problem: Coping: Goal: Level of anxiety will decrease 08/28/2023 0354 by Orene Binning, RN Outcome: Progressing 08/28/2023 0354 by Orene Binning, RN Outcome: Progressing   Problem: Elimination: Goal: Will not experience complications related to bowel motility 08/28/2023 0354 by Orene Binning, RN Outcome: Progressing 08/28/2023 0354 by Orene Binning, RN Outcome: Progressing Goal: Will not experience complications related to urinary retention 08/28/2023 0354 by Orene Binning, RN Outcome: Progressing 08/28/2023 0354 by Orene Binning, RN Outcome: Progressing   Problem: Pain Managment: Goal: General experience of comfort will improve and/or be controlled 08/28/2023 0354 by Orene Binning, RN Outcome: Progressing 08/28/2023 0354 by Orene Binning, RN Outcome: Progressing   Problem: Safety: Goal: Ability to remain free from injury will improve 08/28/2023 0354 by Orene Binning, RN Outcome: Progressing 08/28/2023 0354 by Orene Binning, RN Outcome: Progressing   Problem: Skin Integrity: Goal: Risk for impaired skin integrity will decrease 08/28/2023 0354 by Orene Binning, RN Outcome: Progressing 08/28/2023 0354 by Orene Binning, RN Outcome: Progressing

## 2023-08-29 ENCOUNTER — Inpatient Hospital Stay (HOSPITAL_COMMUNITY)

## 2023-08-29 ENCOUNTER — Encounter (HOSPITAL_COMMUNITY): Payer: Self-pay | Admitting: Internal Medicine

## 2023-08-29 DIAGNOSIS — D509 Iron deficiency anemia, unspecified: Secondary | ICD-10-CM

## 2023-08-29 DIAGNOSIS — G9340 Encephalopathy, unspecified: Secondary | ICD-10-CM | POA: Diagnosis not present

## 2023-08-29 DIAGNOSIS — R569 Unspecified convulsions: Secondary | ICD-10-CM | POA: Diagnosis not present

## 2023-08-29 DIAGNOSIS — E8809 Other disorders of plasma-protein metabolism, not elsewhere classified: Secondary | ICD-10-CM | POA: Diagnosis not present

## 2023-08-29 DIAGNOSIS — E871 Hypo-osmolality and hyponatremia: Secondary | ICD-10-CM

## 2023-08-29 DIAGNOSIS — E876 Hypokalemia: Secondary | ICD-10-CM

## 2023-08-29 LAB — RESPIRATORY PANEL BY PCR

## 2023-08-29 LAB — VITAMIN B12: Vitamin B-12: 398 pg/mL (ref 180–914)

## 2023-08-29 LAB — BASIC METABOLIC PANEL WITH GFR
Anion gap: 11 (ref 5–15)
BUN: 14 mg/dL (ref 8–23)
CO2: 21 mmol/L — ABNORMAL LOW (ref 22–32)
Calcium: 8.4 mg/dL — ABNORMAL LOW (ref 8.9–10.3)
Chloride: 102 mmol/L (ref 98–111)
Creatinine, Ser: 0.81 mg/dL (ref 0.61–1.24)
GFR, Estimated: >60 mL/min (ref >60)
Glucose, Bld: 121 mg/dL — ABNORMAL HIGH (ref 70–99)
Potassium: 3.4 mmol/L — ABNORMAL LOW (ref 3.5–5.1)
Sodium: 134 mmol/L — ABNORMAL LOW (ref 135–145)

## 2023-08-29 LAB — CBC
HCT: 36.1 % — ABNORMAL LOW (ref 39.0–52.0)
Hemoglobin: 12.1 g/dL — ABNORMAL LOW (ref 13.0–17.0)
MCH: 26.5 pg (ref 26.0–34.0)
MCHC: 33.5 g/dL (ref 30.0–36.0)
MCV: 79.2 fL — ABNORMAL LOW (ref 80.0–100.0)
Platelets: 263 10*3/uL (ref 150–400)
RBC: 4.56 MIL/uL (ref 4.22–5.81)
RDW: 13.9 % (ref 11.5–15.5)
WBC: 10.2 10*3/uL (ref 4.0–10.5)
nRBC: 0 % (ref 0.0–0.2)

## 2023-08-29 LAB — PHOSPHORUS: Phosphorus: 2.4 mg/dL — ABNORMAL LOW (ref 2.5–4.6)

## 2023-08-29 LAB — MAGNESIUM: Magnesium: 2 mg/dL (ref 1.7–2.4)

## 2023-08-29 MED ORDER — LEVETIRACETAM (KEPPRA) 500 MG/5 ML ADULT IV PUSH
1000.0000 mg | Freq: Once | INTRAVENOUS | Status: AC
  Administered 2023-08-29: 1000 mg via INTRAVENOUS
  Filled 2023-08-29: qty 10

## 2023-08-29 MED ORDER — LEVETIRACETAM (KEPPRA) 500 MG/5 ML ADULT IV PUSH
500.0000 mg | Freq: Two times a day (BID) | INTRAVENOUS | Status: DC
  Administered 2023-08-29 – 2023-09-02 (×8): 500 mg via INTRAVENOUS
  Filled 2023-08-29 (×9): qty 5

## 2023-08-29 MED ORDER — SODIUM CHLORIDE 0.9 % IV SOLN
500.0000 mg | Freq: Once | INTRAVENOUS | Status: AC
  Administered 2023-08-29: 500 mg via INTRAVENOUS
  Filled 2023-08-29: qty 10

## 2023-08-29 MED ORDER — CYANOCOBALAMIN 1000 MCG/ML IJ SOLN
1000.0000 ug | Freq: Once | INTRAMUSCULAR | Status: AC
  Administered 2023-08-29: 1000 ug via INTRAMUSCULAR
  Filled 2023-08-29: qty 1

## 2023-08-29 MED ORDER — DEXTROSE-SODIUM CHLORIDE 5-0.9 % IV SOLN: INTRAVENOUS | Status: AC

## 2023-08-29 NOTE — Progress Notes (Signed)
 PROGRESS NOTE Christopher Murphy  WUJ:[id number] DOB: 20-Feb-1946 DOA: 08/28/2023 PCP: Carollynn Cirri, NP  Brief Narrative/Hospital Course:  78 y.o. male with hx of vascular dementia, prior CVA with right-sided hemiplegia, HLD, aortic aneurysm, who is transferred from [facility] for altered mental status. PER REPORT he was in normal state of health until awakening on 5/20 morning,woke around 6:30 AM and noted to have slight more weakness on the right side.  However significantly worsened around 7 AM was unable to stand, w/ abnormal speech, noted to have abnormal eye movement possibly right-sided gaze deviation of the left eye.Had rapidly worsened on transfer to the ED and per family has been mostly unresponsive since arrival at the hospital. At his baseline he is ambulatory with the use of a cane has chronic right-sided hemiplegia and some disorientation  In ED:CMP mild hyponatremia, cbc ok,tsh 0.5, rsp covid neg, UA wbc 6-10. Code stroke at OSH ED, was not candidate for TNK or other intervention. CT Head with no acute abnormality, but advanced atrophy and chronic infarcts noted. CTA Head and neck with R vertebral artery dissection, no LVO.  CT perfusion with no core infarct but increased Tmax in cerebellum and right occipital area concerning for hypoperfusion.  MRI brain without contrast demonstrating irregular diffusion in the subcortical right greater than left cerebral hemisphere similar to prior, question related to white matter disease versus changes of recent seizure.  Spot EEG 5/21 negative for epileptiform activity but did have generalized slowing and focal slowing on the right indicating focal cerebral dysfunction. Was recommended for transfer to [facility] for cEEG.     Subjective: Patient seen and examined Daughter-in-law at the bedside Eyes closed does not respond or follow commands able to intermittently move legs and wink eyes Overnight patient has been afebrile hemodynamically stable on room  air Labs reviewed mild hypokalemia in function stable alk phos 2.4 CBC stable On EEG   Assessment and Plan:  Acute Encephalopathy Abnormal eye movement with ? R gaze deviation w/r rapidly worsening mentation and ultimately aphasic and unresponsive: Unclear etiology, r/o nonconvulsive seizure. Neurology following Overall mental status unchanged, unresponsive, able to withdraw some VBG without hypercapnia, TSH lactic acid okay B12 398,will keep >500. Cont LTM EEG ? LP if LTM EEG neg-defer to neurology Continue high-dose thiamine, n.p.o. 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  Currently unable to take p.o. Will continue aspirin  300 mg suppository for now.   Fever with unknown source Tmax of 38.1C on 5/20 and WBC 11. per family only localizing symptom had been 1 month of intermittent cough and fevers. So far no evidence of pneumonia normal LFTs. UA not consistent with UTI, . Has mild ethmoid sinusitis on head CT. Repeat chest x-ray negative, COVID RSV respiratory virus panel negative May need to consider for LP, meningoencephalitis appears less likely Cont on Unasyn  to cover for aspiration,sinusitis.   Vascular dementia: On memantine , donepezil  at home.  Held as he is NPO. History of CVA right-sided hemiplegia: On aspirin  PR per above.  Home rosuvastatin  held due to n.p.o. status. LDL 52, A1c 5.5% 3/'25  HLD: Statin held Aortic aneurysm: Noted    DVT prophylaxis: Place and maintain sequential compression device Start: 08/28/23 2004 Code Status:   Code Status: Limited: Do not attempt resuscitation (DNR) -DNR-LIMITED -Do Not Intubate/DNI  Family Communication: plan of care discussed with patient/DIL at bedside.  Patient status is: Remains hospitalized because of severity of illness Level of care: Telemetry Medical   Dispo: The  patient is from: home            Anticipated disposition: TBD Objective: Vitals last 24 hrs: Vitals:   08/29/23 0318 08/29/23 0432 08/29/23 0648 08/29/23 0652  BP: 132/72 (!) 163/87 (!) 157/84 (!) 157/84  Pulse: 78 79 76 76  Resp: 16 16 17 17   Temp: 98.1 F (36.7 C) 98.1 F (36.7 C) 98.8 F (37.1 C) 98.8 F (37.1 C)  TempSrc:  Oral Axillary Axillary  SpO2: 96% 99% 98% 95%    Physical Examination: General exam: Unresponsive, eyes closed  HEENT:Oral mucosa moist, Ear/Nose WNL grossly Respiratory system: Bilaterally clear BS, no use of accessory muscle Cardiovascular system: S1 & S2 +. Gastrointestinal system: Abdomen soft,ND,BS+ Nervous System: Unresponsive eyes closed  Extremities: LE edema neg, warm extremities Skin: No rashes,warm. MSK: Normal muscle bulk/tone.   Data Reviewed: I have personally reviewed following labs and imaging studies ( see epic result tab) CBC: Recent Labs  Lab 08/27/23 1049 08/27/23 1601 08/28/23 2022 08/29/23 0628  WBC 12.4* 11.1* 10.3 10.2  NEUTROABS 9.0*  --   --   --   HGB 12.9* 12.9* 12.8* 12.1*  HCT 40.5 39.2 38.7* 36.1*  MCV 81.2 80.0 79.8* 79.2*  PLT 308 290 250 263   CMP: Recent Labs  Lab 08/27/23 1049 08/27/23 1601 08/28/23 2022 08/29/23 0628  NA 134*  --  132* 134*  K 3.9  --  3.6 3.4*  CL 101  --  101 102  CO2 25  --  19* 21*  GLUCOSE 116*  --  110* 121*  BUN 11  --  14 14  CREATININE 0.89 0.86 0.85 0.81  CALCIUM  8.9  --  8.5* 8.4*  MG  --   --  1.9 2.0  PHOS  --   --  2.9 2.4*   GFR: Estimated Creatinine Clearance: 73.6 mL/min (by C-G formula based on SCr of 0.81 mg/dL). Recent Labs  Lab 08/27/23 1049 08/28/23 2022  AST 19 17  ALT 13 11  ALKPHOS 66 49  BILITOT 0.8 0.6  PROT 7.6 6.9  ALBUMIN 3.6 2.9*   No results for input(s): "LIPASE", "AMYLASE" in the last 168 hours.  Recent Labs  Lab 08/27/23 2139  AMMONIA <13   Coagulation Profile:  Recent Labs  Lab 08/27/23 1049  INR 1.1   Unresulted Labs  (From admission, onward)     Start     Ordered   08/28/23 1959  Vitamin B1  Once,   R        08/28/23 1958           Antimicrobials/Microbiology: Anti-infectives (From admission, onward)    Start     Dose/Rate Route Frequency Ordered Stop   08/28/23 2145  Ampicillin -Sulbactam (UNASYN ) 3 g in sodium chloride  0.9 % 100 mL IVPB        3 g 200 mL/hr over 30 Minutes Intravenous Every 6 hours 08/28/23 2048 09/02/23 2144         Component Value Date/Time   SDES IN/OUT CATH URINE 05/10/2020 0512   SPECREQUEST NONE 05/10/2020 0512   CULT  05/10/2020 0512    NO GROWTH Performed at [facility], [address] [place], [place] [address]    REPTSTATUS 05/11/2020 FINAL 05/10/2020 2956    Procedures:  Medications reviewed:  Scheduled Meds:  aspirin   300 mg Rectal Daily   sodium  chloride flush  3 mL Intravenous Q12H   Continuous Infusions:  ampicillin -sulbactam (UNASYN ) IV 3 g (08/29/23 0921)   dextrose  5 % and 0.9 % NaCl 50 mL/hr at 08/29/23 1610   thiamine (VITAMIN B1) injection 500 mg (08/29/23 0511)   Followed by   Cecily Cohen ON 08/31/2023] thiamine (VITAMIN B1) injection      Lesa Rape, MD [facility] 08/29/2023, 10:19 AM

## 2023-08-29 NOTE — Plan of Care (Signed)

## 2023-08-29 NOTE — Progress Notes (Addendum)
 Subjective: NAEO. per daughter in law at bedside, at home he needs help with basic ADLs. Has right sided weakness at baseline due to stroke.   ROS: Unable to obtain due to poor mental status  Examination  Vital signs in last 24 hours: Temp:  [97.5 F (36.4 C)-98.8 F (37.1 C)] 98.8 F (37.1 C) (05/22 0652) Pulse Rate:  [76-80] 76 (05/22 0652) Resp:  [16-20] 17 (05/22 0652) BP: (117-163)/(63-87) 157/84 (05/22 0652) SpO2:  [95 %-99 %] 95 % (05/22 0652) FiO2 (%):  [0 %] 0 % (05/21 1928)  General: lying in bed, NAD Neuro: doesn't open eyes to repeated tactile stimulation, grimaces to noxious stimulation, on passive eye opening, no gaze deviation, pupils reactive to light, difficult to appreciate facial asymmetry due to beard, moves all extremities with anti gravity strength with right hemiparesis  Basic Metabolic Panel: Recent Labs  Lab 08/27/23 1049 08/27/23 1601 08/28/23 2022 08/29/23 0628  NA 134*  --  132* 134*  K 3.9  --  3.6 3.4*  CL 101  --  101 102  CO2 25  --  19* 21*  GLUCOSE 116*  --  110* 121*  BUN 11  --  14 14  CREATININE 0.89 0.86 0.85 0.81  CALCIUM  8.9  --  8.5* 8.4*  MG  --   --  1.9 2.0  PHOS  --   --  2.9 2.4*    CBC: Recent Labs  Lab 08/27/23 1049 08/27/23 1601 08/28/23 2022 08/29/23 0628  WBC 12.4* 11.1* 10.3 10.2  NEUTROABS 9.0*  --   --   --   HGB 12.9* 12.9* 12.8* 12.1*  HCT 40.5 39.2 38.7* 36.1*  MCV 81.2 80.0 79.8* 79.2*  PLT 308 290 250 263     Coagulation Studies: Recent Labs    08/27/23 1049  LABPROT 14.6  INR 1.1    Imaging personally reviewed  CT head without contrast 08/27/2023: 1.No acute intracranial finding. 2. Chronic cortical/subcortical infarct within the left occipital lobe (PCA vascular territory), unchanged. 3. Chronic infarct within the left thalamus, unchanged. 4. Background advanced cerebral atrophy and advanced cerebral white matter chronic small vessel ischemic disease.   CT head and neck with and  without contrast 08/27/2023:  Irregularity of the right vertebral artery at the distal V3 segment with significantly diminished intraluminal contrast within the vessel from the distal V3 segment to the vertebrobasilar confluence. Abnormal intraluminal soft tissue in the proximal V4 segment concerning for dissection flap. Nonocclusive thrombus is less likely. There is no evidence of occlusive thrombus.  Additional diminished contrast within the basilar artery and right posterior cerebral artery. Diminished caliber of distal right PCA branches without focal occlusion identified, overall similar in caliber to 2023. No core infarct identified on CT perfusion. Areas of elevated T-max in the cerebellum and right occipital lobe concerning for hypoperfusion. Diminished caliber of the right PCA appears similar to prior. Normal caliber of vasculature in the anterior circulation without occlusion, high-grade stenosis, dissection, or aneurysm.  MRI brain without contrast 08/27/2023: 1.Advanced cerebral atrophy with severe white matter disease, nonspecific. While these findings could be related to severe chronic microvascular ischemic disease, the degree of white matter changes are markedly advanced, with a possible adult onset leukodystrophy/leukoencephalopathy also a consideration. 2. Irregular diffusion signal abnormality involving the subcortical aspects of the right greater than left cerebral hemispheres, similar to previous exams. Finding is of uncertain etiology, and could be related to the underlying severe chronic white matter disease. Possible superimposed changes of acute/recent  seizure could also be considered. 3. No other acute intracranial abnormality. 4. Chronic left PCA distribution infarct.     ASSESSMENT AND PLAN: 78 year old male with history of dementia, prior stroke with right-sided deficits who presented with speech disturbance and altered mental status since  08/27/2023.  Acute encephalopathy Aphasia Leukocytosis (resolved) Microcytic anemia Hypoalbuminemia Hyponatremia Hypokalemia Hypophosphatemia - Encephalopathy and aphasia along with MRI and EEG findings concerning for seizures  Recommendations - Give one time dose of keppra 1000mg  and start 500mg  BID maintenance - Low suspicion for meningitis but if becomes febrile again or wbc worsens, will proceed with LP - Continue video EEG monitoring - Continue seizure precautions - prn versed  for clinical seizure lasting more than 2 minutes - Discussed plan with DIL at bedside  I have spent a total of  36  minutes with the patient reviewing hospital notes,  test results, labs and examining the patient as well as establishing an assessment and plan.  > 50% of time was spent in direct patient care.     Roxy Cordial Epilepsy [facility] For questions after 5pm please refer to AMION to reach the Neurologist on call

## 2023-08-29 NOTE — Progress Notes (Signed)
 LTM maint complete - no skin breakdown under:  F8,F4,FZ

## 2023-08-29 NOTE — Procedures (Signed)
 Patient Name: Christopher Murphy  MRN: [id number]  Epilepsy Attending: Arleene Lack  Referring Physician/Provider: Ronnette Coke, MD  Duration: 08/28/2023 2022 to 08/29/2023 2022  Patient history:  78 y.o. male with hx of dementia, prior CVA with right-sided deficits who was brought to the emergency department for aphasia since 5/19 PM. EEG to evaluate for seizure  Level of alertness: Awake, asleep  AEDs during EEG study: LEV, PHT  Technical aspects: This EEG study was done with scalp electrodes positioned according to the 10-20 International system of electrode placement. Electrical activity was reviewed with band pass filter of 1-70Hz , sensitivity of 7 uV/mm, display speed of 42mm/sec with a 60Hz  notched filter applied as appropriate. EEG data were recorded continuously and digitally stored.  Video monitoring was available and reviewed as appropriate.  Description: The posterior dominant rhythm consists of 8 Hz activity of moderate voltage (25-35 uV) seen predominantly in posterior head regions, symmetric and reactive to eye opening and eye closing. Sleep was characterized by vertex waves, sleep spindles (12 to 14 Hz), maximal frontocentral region. EEG showed continuous 3 to 6 Hz theta-delta slowing in right fronto-temporal region. Intermittent generalized 3 to 6 Hz theta-delta slowing was also noted. Hyperventilation and photic stimulation were not performed.    Throughout the study,  eeg showed 7-8hz  alpha activity in left posterior quadrant as well as 3-5hz  high amplitude sharply contoured theta-delta slowing involving all of left hemisphere. No clinical signs were noted.  Patient was given Keppra 1000 mg on 08/30/2023 at around 0922 without significant change in EEG pattern.  He was then given fosphenytoin 500 mg on 08/30/2023 at around 1823.  Subsequently this EEG pattern resolved .  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  Additionally, EEG was suggestive of cortical dysfunction arising from right hemisphere likely secondary to underlying structural abnormality, post-ictal state.  Lastly there was mild diffuse encephalopathy.  Thania Woodlief O Vaniyah Lansky

## 2023-08-29 NOTE — Hospital Course (Addendum)
 78 y.o. M with CVA with residual R hemiplegia and dementia, HLD who presented with inability to talk.  Transferred from [facility] for Neurology consultation and cEEG which confirmed seizures.

## 2023-08-29 NOTE — Plan of Care (Signed)

## 2023-08-30 DIAGNOSIS — R4189 Other symptoms and signs involving cognitive functions and awareness: Secondary | ICD-10-CM | POA: Diagnosis not present

## 2023-08-30 DIAGNOSIS — G9340 Encephalopathy, unspecified: Secondary | ICD-10-CM | POA: Diagnosis not present

## 2023-08-30 DIAGNOSIS — R569 Unspecified convulsions: Secondary | ICD-10-CM | POA: Diagnosis not present

## 2023-08-30 LAB — CBC WITH DIFFERENTIAL/PLATELET
Abs Immature Granulocytes: 0.03 10*3/uL (ref 0.00–0.07)
Basophils Absolute: 0 10*3/uL (ref 0.0–0.1)
Basophils Relative: 0 %
Eosinophils Absolute: 0.2 10*3/uL (ref 0.0–0.5)
Eosinophils Relative: 2 %
HCT: 36.9 % — ABNORMAL LOW (ref 39.0–52.0)
Hemoglobin: 12.2 g/dL — ABNORMAL LOW (ref 13.0–17.0)
Immature Granulocytes: 0 %
Lymphocytes Relative: 19 %
Lymphs Abs: 2 10*3/uL (ref 0.7–4.0)
MCH: 26.3 pg (ref 26.0–34.0)
MCHC: 33.1 g/dL (ref 30.0–36.0)
MCV: 79.7 fL — ABNORMAL LOW (ref 80.0–100.0)
Monocytes Absolute: 1.1 10*3/uL — ABNORMAL HIGH (ref 0.1–1.0)
Monocytes Relative: 10 %
Neutro Abs: 7.4 10*3/uL (ref 1.7–7.7)
Neutrophils Relative %: 69 %
Platelets: 249 10*3/uL (ref 150–400)
RBC: 4.63 MIL/uL (ref 4.22–5.81)
RDW: 13.9 % (ref 11.5–15.5)
WBC: 10.8 10*3/uL — ABNORMAL HIGH (ref 4.0–10.5)
nRBC: 0 % (ref 0.0–0.2)

## 2023-08-30 LAB — MENINGITIS/ENCEPHALITIS PANEL (CSF)

## 2023-08-30 LAB — COMPREHENSIVE METABOLIC PANEL WITH GFR
ALT: 9 U/L (ref 0–44)
AST: 19 U/L (ref 15–41)
Albumin: 2.6 g/dL — ABNORMAL LOW (ref 3.5–5.0)
Alkaline Phosphatase: 46 U/L (ref 38–126)
Anion gap: 10 (ref 5–15)
BUN: 13 mg/dL (ref 8–23)
CO2: 21 mmol/L — ABNORMAL LOW (ref 22–32)
Calcium: 8.3 mg/dL — ABNORMAL LOW (ref 8.9–10.3)
Chloride: 106 mmol/L (ref 98–111)
Creatinine, Ser: 0.78 mg/dL (ref 0.61–1.24)
GFR, Estimated: >60 mL/min (ref >60)
Glucose, Bld: 112 mg/dL — ABNORMAL HIGH (ref 70–99)
Potassium: 3.3 mmol/L — ABNORMAL LOW (ref 3.5–5.1)
Sodium: 137 mmol/L (ref 135–145)
Total Bilirubin: 0.7 mg/dL (ref 0.0–1.2)
Total Protein: 6.4 g/dL — ABNORMAL LOW (ref 6.5–8.1)

## 2023-08-30 LAB — CSF CELL COUNT WITH DIFFERENTIAL
RBC Count, CSF: 685 /mm3 — ABNORMAL HIGH
Tube #: 1
WBC, CSF: 2 /mm3 (ref 0–5)

## 2023-08-30 LAB — PROTEIN AND GLUCOSE, CSF
Glucose, CSF: 70 mg/dL (ref 40–70)
Total  Protein, CSF: 62 mg/dL — ABNORMAL HIGH (ref 15–45)

## 2023-08-30 MED ORDER — LIDOCAINE HCL (PF) 1 % IJ SOLN
5.0000 mL | Freq: Once | INTRAMUSCULAR | Status: AC
  Administered 2023-08-30: 5 mL
  Filled 2023-08-30: qty 5

## 2023-08-30 MED ORDER — POTASSIUM CHLORIDE 10 MEQ/100ML IV SOLN
10.0000 meq | INTRAVENOUS | Status: AC
  Administered 2023-08-30 (×3): 10 meq via INTRAVENOUS
  Filled 2023-08-30 (×3): qty 100

## 2023-08-30 NOTE — Progress Notes (Signed)
 Subjective: No acute events overnight.  Slightly more awake today, opening eyes spontaneously.  Still nonverbal and not following any commands  ROS: Unable to obtain due to poor mental status  Examination  Vital signs in last 24 hours: Temp:  [97.8 F (36.6 C)-98.4 F (36.9 C)] 98.4 F (36.9 C) (05/23 0334) Pulse Rate:  [65-75] 68 (05/23 0818) Resp:  [16-20] 16 (05/23 0334) BP: (142-151)/(76-82) 144/78 (05/23 0818) SpO2:  [97 %-98 %] 98 % (05/23 0818)  General: lying in bed, NAD Neuro: Opening eyes spontaneously, does not track examiner, does not follow commands, nonverbal, PERRLA, antigravity strength in all 4 extremities with right hemiparesis  Basic Metabolic Panel: Recent Labs  Lab 08/27/23 1049 08/27/23 1601 08/28/23 2022 08/29/23 0628  NA 134*  --  132* 134*  K 3.9  --  3.6 3.4*  CL 101  --  101 102  CO2 25  --  19* 21*  GLUCOSE 116*  --  110* 121*  BUN 11  --  14 14  CREATININE 0.89 0.86 0.85 0.81  CALCIUM  8.9  --  8.5* 8.4*  MG  --   --  1.9 2.0  PHOS  --   --  2.9 2.4*    CBC: Recent Labs  Lab 08/27/23 1049 08/27/23 1601 08/28/23 2022 08/29/23 0628  WBC 12.4* 11.1* 10.3 10.2  NEUTROABS 9.0*  --   --   --   HGB 12.9* 12.9* 12.8* 12.1*  HCT 40.5 39.2 38.7* 36.1*  MCV 81.2 80.0 79.8* 79.2*  PLT 308 290 250 263     Coagulation Studies: Recent Labs    08/27/23 1049  LABPROT 14.6  INR 1.1    Imaging No new brain imaging    ASSESSMENT AND PLAN:78 year old male with history of dementia, prior stroke with right-sided deficits who presented with speech disturbance and altered mental status since 08/27/2023.   Acute encephalopathy Aphasia - Encephalopathy and aphasia along with MRI and EEG findings concerning for seizures   Recommendations - EEG appears to be improved after phenytoin load yesterday.  Continue Keppra 500 mg twice daily for now -Patient's family asked about prognostication because patient is DNR/DNI.  Although low suspicion, given  abrupt change, fever and initial presentation, I discussed about proceeding with a lumbar puncture to rule out any CNS infection before any definitive prognostication.  His daughter-in-law understands and is in agreement - Continue video EEG monitoring to look for intermittent seizures - Continue seizure precautions - prn versed  for clinical seizure lasting more than 2 minutes - Discussed plan with DIL at bedside   I have spent a total of  38 minutes with the patient reviewing hospital notes,  test results, labs and examining the patient as well as establishing an assessment and plan.  > 50% of time was spent in direct patient care.    Roxy Cordial Epilepsy [facility] For questions after 5pm please refer to AMION to reach the Neurologist on call

## 2023-08-30 NOTE — Progress Notes (Signed)
 PROGRESS NOTE Christopher Murphy  ZOX:[id number] DOB: Dec 08, 1945 DOA: 08/28/2023 PCP: Carollynn Cirri, NP  Brief Narrative/Hospital Course:  78 y.o. male with hx of vascular dementia, prior CVA with right-sided hemiplegia, HLD, aortic aneurysm, who is transferred from [facility] for altered mental status. PER REPORT he was in normal state of health until awakening on 5/20 morning,woke around 6:30 AM and noted to have slight more weakness on the right side.  However significantly worsened around 7 AM was unable to stand, w/ abnormal speech, noted to have abnormal eye movement possibly right-sided gaze deviation of the left eye.Had rapidly worsened on transfer to the ED and per family has been mostly unresponsive since arrival at the hospital. At his baseline he is ambulatory with the use of a cane has chronic right-sided hemiplegia and some disorientation  In ED:CMP mild hyponatremia, cbc ok,tsh 0.5, rsp covid neg, UA wbc 6-10. Code stroke at OSH ED, was not candidate for TNK or other intervention. CT Head with no acute abnormality, but advanced atrophy and chronic infarcts noted. CTA Head and neck with R vertebral artery dissection, no LVO.  CT perfusion with no core infarct but increased Tmax in cerebellum and right occipital area concerning for hypoperfusion.  MRI brain without contrast demonstrating irregular diffusion in the subcortical right greater than left cerebral hemisphere similar to prior, question related to white matter disease versus changes of recent seizure.  Spot EEG 5/21 negative for epileptiform activity but did have generalized slowing and focal slowing on the right indicating focal cerebral dysfunction. Was recommended for transfer to [facility] for cEEG Patient has been progressing with spontaneous opening of his eyes. EEG testing and MRI is consistent with seizure-like activity.  Neurology is following.   Subjective: Patient seen and examined Daughter-in-law at the bedside Patient  spontaneously opening the eyes but not to command. Assessment and Plan:  Acute Encephalopathy Abnormal eye movement with ? R gaze deviation w/r rapidly worsening mentation and ultimately aphasic and unresponsive: Unclear etiology, r/o nonconvulsive seizure. Neurology following Overall mental status unchanged, unresponsive, able to withdraw some VBG without hypercapnia, TSH lactic acid okay B12 398,will keep >500. Cont LTM EEG ? LP if LTM EEG neg-defer to neurology Continue high-dose thiamine, n.p.o. 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 Patient loaded with phenytoin and continuing with Keppra. Shows some mild improvement with spontaneous opening of eyes. 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  Currently unable to take p.o. Will continue aspirin  300 mg suppository for now.   Fever with unknown source Tmax of 38.1C on 5/20 and WBC 11. per family only localizing symptom had been 1 month of intermittent cough and fevers. So far no evidence of pneumonia normal LFTs. UA not consistent with UTI, . Has mild ethmoid sinusitis on head CT. Repeat chest x-ray negative, COVID RSV respiratory virus panel negative May need to consider for LP, meningoencephalitis appears less likely Cont on Unasyn  to cover for aspiration,sinusitis.   Vascular dementia: On memantine , donepezil  at home.  Held as he is NPO. History of CVA right-sided hemiplegia: On aspirin  PR per above.  Home rosuvastatin  held due to n.p.o. status. LDL 52, A1c 5.5% 3/'25  HLD: Statin held Aortic aneurysm: Noted    DVT prophylaxis: Place and maintain sequential compression device Start: 08/28/23 2004 Code Status:   Code Status: Limited: Do  not attempt resuscitation (DNR) -DNR-LIMITED -Do Not Intubate/DNI  Family  Communication: plan of care discussed with patient/DIL at bedside. Patient status is: Remains hospitalized because of severity of illness Level of care: Telemetry Medical   Dispo: The patient is from: home            Anticipated disposition: TBD Objective: Vitals last 24 hrs: Vitals:   08/30/23 0000 08/30/23 0332 08/30/23 0334 08/30/23 0818  BP: (!) 150/78 (!) 149/79 (!) 149/79 (!) 144/78  Pulse: 68 66 65 68  Resp: 16 16 16    Temp: 97.8 F (36.6 C) 98.4 F (36.9 C) 98.4 F (36.9 C)   TempSrc: Oral Axillary    SpO2: 97% 98% 98% 98%    Physical Examination: General exam: Unresponsive, eyes closed  HEENT:Oral mucosa moist, Ear/Nose WNL grossly Respiratory system: Bilaterally clear BS, no use of accessory muscle Cardiovascular system: S1 & S2 +. Gastrointestinal system: Abdomen soft,ND,BS+ Nervous System: Unresponsive eyes closed  Extremities: LE edema neg, warm extremities Skin: No rashes,warm. MSK: Normal muscle bulk/tone.   Data Reviewed: I have personally reviewed following labs and imaging studies ( see epic result tab) CBC: Recent Labs  Lab 08/27/23 1049 08/27/23 1601 08/28/23 2022 08/29/23 0628  WBC 12.4* 11.1* 10.3 10.2  NEUTROABS 9.0*  --   --   --   HGB 12.9* 12.9* 12.8* 12.1*  HCT 40.5 39.2 38.7* 36.1*  MCV 81.2 80.0 79.8* 79.2*  PLT 308 290 250 263   CMP: Recent Labs  Lab 08/27/23 1049 08/27/23 1601 08/28/23 2022 08/29/23 0628  NA 134*  --  132* 134*  K 3.9  --  3.6 3.4*  CL 101  --  101 102  CO2 25  --  19* 21*  GLUCOSE 116*  --  110* 121*  BUN 11  --  14 14  CREATININE 0.89 0.86 0.85 0.81  CALCIUM  8.9  --  8.5* 8.4*  MG  --   --  1.9 2.0  PHOS  --   --  2.9 2.4*   GFR: Estimated Creatinine Clearance: 73.6 mL/min (by C-G formula based on SCr of 0.81 mg/dL). Recent Labs  Lab 08/27/23 1049 08/28/23 2022  AST 19 17  ALT 13 11  ALKPHOS 66 49  BILITOT 0.8 0.6  PROT 7.6 6.9  ALBUMIN 3.6 2.9*   No results for input(s): "LIPASE", "AMYLASE"  in the last 168 hours.  Recent Labs  Lab 08/27/23 2139  AMMONIA <13   Coagulation Profile:  Recent Labs  Lab 08/27/23 1049  INR 1.1   Unresulted Labs (From admission, onward)     Start     Ordered   08/31/23 0500  CBC with Differential/Platelet  Tomorrow morning,   R        08/30/23 0802   08/31/23 0500  Comprehensive metabolic panel with GFR  Tomorrow morning,   R        08/30/23 0802   08/30/23 1151  Meningitis/Encephalitis Panel (CSF)  Once,   R        08/30/23 1150   08/30/23 1150  Protein and glucose, CSF  Once,   R        08/30/23 1150   08/30/23 1150  CSF cell count with differential  (CSF cell count with differential (x 2 tubes) panel)  Once,   R       Question:  Are there also cytology or pathology orders on this specimen?  Answer:  Yes   08/30/23 1150   08/30/23 0802  Comprehensive metabolic panel with GFR  Once,   R        08/30/23 0802   08/30/23 0802  CBC with Differential/Platelet  Once,   R        08/30/23 0802   08/28/23 1959  Vitamin B1  Once,   R        08/28/23 1958           Antimicrobials/Microbiology: Anti-infectives (From admission, onward)    Start     Dose/Rate Route Frequency Ordered Stop   08/28/23 2145  Ampicillin -Sulbactam (UNASYN ) 3 g in sodium chloride  0.9 % 100 mL IVPB        3 g 200 mL/hr over 30 Minutes Intravenous Every 6 hours 08/28/23 2048 09/02/23 2144         Component Value Date/Time   SDES IN/OUT CATH URINE 05/10/2020 0512   SPECREQUEST NONE 05/10/2020 0512   CULT  05/10/2020 0512    NO GROWTH Performed at [facility], [address], [place], [place] [address]    REPTSTATUS 05/11/2020 FINAL 05/10/2020 9563    Procedures:  Medications reviewed:  Scheduled Meds:  aspirin   300 mg Rectal Daily   levETIRAcetam   500 mg Intravenous Q12H   lidocaine  (PF)  5 mL Other Once   sodium chloride  flush  3 mL Intravenous Q12H   Continuous Infusions:  ampicillin -sulbactam (UNASYN ) IV 3 g (08/30/23 1033)   dextrose  5 %  and 0.9 % NaCl 50 mL/hr at 08/29/23 2321   thiamine  (VITAMIN B1) injection 500 mg (08/30/23 0510)   Followed by   Cecily Cohen ON 08/31/2023] thiamine  (VITAMIN B1) injection      Edwena Graham, MD [facility] 08/30/2023, 12:10 PM

## 2023-08-30 NOTE — Care Management Important Message (Signed)
 Important Message  Patient Details  Name: Christopher Murphy MRN: [id number] Date of Birth: [birth year]   Important Message Given:  Yes - Medicare IM     Wynonia Hedges 08/30/2023, 3:41 PM

## 2023-08-30 NOTE — Procedures (Signed)
 Patient Name: Christopher Murphy  MRN: [id number]  Epilepsy Attending: Arleene Lack  Referring Physician/Provider: Ronnette Coke, MD  Duration: 08/29/2023 2022 to 08/30/2023 2022   Patient history:  78 y.o. male with hx of dementia, prior CVA with right-sided deficits who was brought to the emergency department for aphasia since 5/19 PM. EEG to evaluate for seizure   Level of alertness: Awake, asleep   AEDs during EEG study: LEV   Technical aspects: This EEG study was done with scalp electrodes positioned according to the 10-20 International system of electrode placement. Electrical activity was reviewed with band pass filter of 1-70Hz , sensitivity of 7 uV/mm, display speed of 39mm/sec with a 60Hz  notched filter applied as appropriate. EEG data were recorded continuously and digitally stored.  Video monitoring was available and reviewed as appropriate.   Description: The posterior dominant rhythm consists of 8 Hz activity of moderate voltage (25-35 uV) seen predominantly in posterior head regions, symmetric and reactive to eye opening and eye closing. Sleep was characterized by vertex waves, sleep spindles (12 to 14 Hz), maximal frontocentral region. EEG showed continuous 3 to 6 Hz theta-delta slowing in right fronto-temporal region. Intermittent rhythmic 3-5hz  theta- delta slowing was also noted in left posterior quadrant.  Hyperventilation and photic stimulation were not performed.     ABNORMALITY - Continuous slow, right hemisphere - Intermittent rhythmic slow, left posterior quadrant   IMPRESSION: This study was suggestive of cortical dysfunction arising from right hemisphere likely secondary to underlying structural abnormality, post-ictal state.  Additionally there was cortical dysfunction in left posterior quadrant likely secondary to underlying structural abnormality.  No definite seizures were noted.   Shakeema Lippman O Alyzae Hawkey

## 2023-08-30 NOTE — Progress Notes (Signed)
SCD placed per order. 

## 2023-08-30 NOTE — Procedures (Signed)
 LUMBAR PUNCTURE (SPINAL TAP) PROCEDURE NOTE  Indication: To obtain CSF for diagnostic testing   Proceduralists: S. Domnic Vantol   Risks of the procedure were dicussed with the patient including post-LP headache, bleeding, infection, weakness/numbness of legs(radiculopathy), death.    Consent obtained from: relative, patient's daughter-in-law at bedside (confirmed she is point of care contact for consent for procedures)    Procedure Note The patient was prepped and draped, and using sterile technique a 20 gauge quinke spinal needle was inserted in the L3-4 space.    Approximately 14 cc of CSF were obtained and sent for analysis.  Patient tolerated the procedure well and blood loss was minimal.    Dylanie Quesenberry, AGAC-NP [facility] Pager: [phone]

## 2023-08-30 NOTE — Plan of Care (Signed)
  Problem: Clinical Measurements: Goal: Ability to maintain clinical measurements within normal limits will improve Outcome: Progressing   Problem: Education: Goal: Knowledge of General Education information will improve Description: Including pain rating scale, medication(s)/side effects and non-pharmacologic comfort measures Outcome: Progressing   Problem: Nutrition: Goal: Adequate nutrition will be maintained Outcome: Progressing   Problem: Coping: Goal: Level of anxiety will decrease Outcome: Progressing   Problem: Elimination: Goal: Will not experience complications related to urinary retention Outcome: Progressing   Problem: Skin Integrity: Goal: Risk for impaired skin integrity will decrease Outcome: Progressing

## 2023-08-31 DIAGNOSIS — R569 Unspecified convulsions: Secondary | ICD-10-CM | POA: Diagnosis not present

## 2023-08-31 DIAGNOSIS — G9340 Encephalopathy, unspecified: Secondary | ICD-10-CM | POA: Diagnosis not present

## 2023-08-31 DIAGNOSIS — R4701 Aphasia: Secondary | ICD-10-CM | POA: Diagnosis not present

## 2023-08-31 LAB — CBC WITH DIFFERENTIAL/PLATELET
Abs Immature Granulocytes: 0.03 10*3/uL (ref 0.00–0.07)
Basophils Absolute: 0.1 10*3/uL (ref 0.0–0.1)
Basophils Relative: 1 %
Eosinophils Absolute: 0.4 10*3/uL (ref 0.0–0.5)
Eosinophils Relative: 4 %
HCT: 34.7 % — ABNORMAL LOW (ref 39.0–52.0)
Hemoglobin: 11.6 g/dL — ABNORMAL LOW (ref 13.0–17.0)
Immature Granulocytes: 0 %
Lymphocytes Relative: 21 %
Lymphs Abs: 1.9 10*3/uL (ref 0.7–4.0)
MCH: 26.7 pg (ref 26.0–34.0)
MCHC: 33.4 g/dL (ref 30.0–36.0)
MCV: 79.8 fL — ABNORMAL LOW (ref 80.0–100.0)
Monocytes Absolute: 0.8 10*3/uL (ref 0.1–1.0)
Monocytes Relative: 9 %
Neutro Abs: 5.9 10*3/uL (ref 1.7–7.7)
Neutrophils Relative %: 65 %
Platelets: 253 10*3/uL (ref 150–400)
RBC: 4.35 MIL/uL (ref 4.22–5.81)
RDW: 13.8 % (ref 11.5–15.5)
WBC: 9 10*3/uL (ref 4.0–10.5)
nRBC: 0 % (ref 0.0–0.2)

## 2023-08-31 LAB — COMPREHENSIVE METABOLIC PANEL WITH GFR
ALT: 10 U/L (ref 0–44)
AST: 15 U/L (ref 15–41)
Albumin: 2.4 g/dL — ABNORMAL LOW (ref 3.5–5.0)
Alkaline Phosphatase: 46 U/L (ref 38–126)
Anion gap: 8 (ref 5–15)
BUN: 13 mg/dL (ref 8–23)
CO2: 22 mmol/L (ref 22–32)
Calcium: 8.2 mg/dL — ABNORMAL LOW (ref 8.9–10.3)
Chloride: 110 mmol/L (ref 98–111)
Creatinine, Ser: 0.81 mg/dL (ref 0.61–1.24)
GFR, Estimated: >60 mL/min (ref >60)
Glucose, Bld: 92 mg/dL (ref 70–99)
Potassium: 3.2 mmol/L — ABNORMAL LOW (ref 3.5–5.1)
Sodium: 140 mmol/L (ref 135–145)
Total Bilirubin: 0.8 mg/dL (ref 0.0–1.2)
Total Protein: 6 g/dL — ABNORMAL LOW (ref 6.5–8.1)

## 2023-08-31 MED ORDER — POTASSIUM CHLORIDE 10 MEQ/100ML IV SOLN
10.0000 meq | INTRAVENOUS | Status: AC
  Administered 2023-08-31 (×4): 10 meq via INTRAVENOUS
  Filled 2023-08-31 (×4): qty 100

## 2023-08-31 MED ORDER — DEXTROSE-SODIUM CHLORIDE 5-0.9 % IV SOLN: INTRAVENOUS | Status: AC

## 2023-08-31 NOTE — Progress Notes (Signed)
 PROGRESS NOTE    Christopher Murphy  WUJ:[id number] DOB: Mar 20, 1946 DOA: 08/28/2023 PCP: Carollynn Cirri, NP   Brief Narrative:    78 y.o. male with hx of vascular dementia, prior CVA with right-sided hemiplegia, HLD, aortic aneurysm, who is transferred from [facility] for altered mental status.  He has been admitted with acute encephalopathy and aphasia started for seizures and remains on IV Keppra which were performed 5/23 for further evaluation per neurology.  He was evaluated with long-term EEG with no seizure activity seen, but appears to have no improvement in mentation.  Appreciate further neurology evaluation and palliative consulted for goals of care 5/24.  Assessment & Plan:   Principal Problem:   Encephalopathy Active Problems:   Decreased level of consciousness   Vertebral artery dissection (HCC)  Assessment and Plan:  Acute Encephalopathy-persistent Abnormal eye movement with ? R gaze deviation w/r rapidly worsening mentation and ultimately aphasic and unresponsive: Unclear etiology, r/o nonconvulsive seizure. Neurology following Overall mental status unchanged, unresponsive, able to withdraw some VBG without hypercapnia, TSH lactic acid okay B12 398,will keep >500. Continue high-dose thiamine, n.p.o. 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 Patient loaded with phenytoin and continuing with Keppra. LP performed 5/23 with no significant findings Long-term EEG with no seizure activity, appreciate further neurology recommendations Consult palliative for goals of care   Right vertebral artery dissection Per neuro recs continue DAPT x 90 days followed by Plavix  75 mg daily.  Currently unable to take p.o. Will continue aspirin  300 mg suppository for now.   Fever  with unknown source-improved Tmax of 38.1C on 5/20 and WBC 11. per family only localizing symptom had been 1 month of intermittent cough and fevers. So far no evidence of pneumonia normal LFTs. UA not consistent with UTI, . Has mild ethmoid sinusitis on head CT. Repeat chest x-ray negative, COVID RSV respiratory virus panel negative Cont on Unasyn  to cover for aspiration pneumonia with plans to stop 5/26 Patient has undergone LP on 5/23 per neurology   Vascular dementia: On memantine , donepezil  at home.  Held as he is NPO. History of CVA right-sided hemiplegia: On aspirin  PR per above.  Home rosuvastatin  held due to n.p.o. status. LDL 52, A1c 5.5% 3/'25  HLD: Statin held Aortic aneurysm: Noted    DVT prophylaxis: SCDs Code Status: DNR Family Communication: Daughter-in-law Christopher Murphy at bedside 5/24 Disposition Plan:  Status is: Inpatient Remains inpatient appropriate because: Need for IV medications.   Consultants:  Neurology  Procedures:  Lumbar puncture 5/23  Antimicrobials:  Anti-infectives (From admission, onward)    Start     Dose/Rate Route Frequency Ordered Stop   08/28/23 2145  Ampicillin -Sulbactam (UNASYN ) 3 g in sodium chloride  0.9 % 100 mL IVPB        3 g 200 mL/hr over 30 Minutes Intravenous Every 6 hours 08/28/23 2048 09/02/23 2144       Subjective: Patient seen and evaluated today with no acute events noted in the last 24 hours or overnight events.  He appears to have completed long-term EEG monitoring with no seizure activity seen.  Daughter-in-law at bedside questioning prognosis.  Objective: Vitals:   08/30/23 1632 08/30/23 2024 08/30/23 2343 08/31/23 0411  BP: (!) 145/78 138/71 (!) 144/72 128/68  Pulse: 72 68 71 73  Resp:  17 18 17   Temp:  98 F (36.7  C) 98.2 F (36.8 C) 97.7 F (36.5 C)  TempSrc:    Axillary  SpO2: 99% 100% 99% 99%    Intake/Output Summary (Last 24 hours) at 08/31/2023 1610 Last data filed at 08/30/2023 1033 Gross per 24 hour   Intake --  Output 500 ml  Net -500 ml   There were no vitals filed for this visit.  Examination:  General exam: Appears somnolent and unresponsive Respiratory system: Clear to auscultation. Respiratory effort normal. Cardiovascular system: S1 & S2 heard, RRR.  Gastrointestinal system: Abdomen is soft Central nervous system: Somnolent Extremities: No edema Skin: No significant lesions noted Foley with clear, yellow urine output    Data Reviewed: I have personally reviewed following labs and imaging studies  CBC: Recent Labs  Lab 08/27/23 1049 08/27/23 1601 08/28/23 2022 08/29/23 0628 08/30/23 1326  WBC 12.4* 11.1* 10.3 10.2 10.8*  NEUTROABS 9.0*  --   --   --  7.4  HGB 12.9* 12.9* 12.8* 12.1* 12.2*  HCT 40.5 39.2 38.7* 36.1* 36.9*  MCV 81.2 80.0 79.8* 79.2* 79.7*  PLT 308 290 250 263 249   Basic Metabolic Panel: Recent Labs  Lab 08/27/23 1049 08/27/23 1601 08/28/23 2022 08/29/23 0628 08/30/23 1326  NA 134*  --  132* 134* 137  K 3.9  --  3.6 3.4* 3.3*  CL 101  --  101 102 106  CO2 25  --  19* 21* 21*  GLUCOSE 116*  --  110* 121* 112*  BUN 11  --  14 14 13   CREATININE 0.89 0.86 0.85 0.81 0.78  CALCIUM  8.9  --  8.5* 8.4* 8.3*  MG  --   --  1.9 2.0  --   PHOS  --   --  2.9 2.4*  --    GFR: Estimated Creatinine Clearance: 74.5 mL/min (by C-G formula based on SCr of 0.78 mg/dL). Liver Function Tests: Recent Labs  Lab 08/27/23 1049 08/28/23 2022 08/30/23 1326  AST 19 17 19   ALT 13 11 9   ALKPHOS 66 49 46  BILITOT 0.8 0.6 0.7  PROT 7.6 6.9 6.4*  ALBUMIN 3.6 2.9* 2.6*   No results for input(s): "LIPASE", "AMYLASE" in the last 168 hours. Recent Labs  Lab 08/27/23 2139  AMMONIA <13   Coagulation Profile: Recent Labs  Lab 08/27/23 1049  INR 1.1   Cardiac Enzymes: No results for input(s): "CKTOTAL", "CKMB", "CKMBINDEX", "TROPONINI" in the last 168 hours. BNP (last 3 results) No results for input(s): "PROBNP" in the last 8760 hours. HbA1C: No  results for input(s): "HGBA1C" in the last 72 hours. CBG: Recent Labs  Lab 08/27/23 1039  GLUCAP 112*   Lipid Profile: No results for input(s): "CHOL", "HDL", "LDLCALC", "TRIG", "CHOLHDL", "LDLDIRECT" in the last 72 hours. Thyroid  Function Tests: Recent Labs    08/28/23 2022  TSH 0.587   Anemia Panel: Recent Labs    08/29/23 0628  VITAMINB12 398   Sepsis Labs: Recent Labs  Lab 08/28/23 2022  LATICACIDVEN 1.8    Recent Results (from the past 240 hours)  Respiratory (~20 pathogens) panel by PCR     Status: None   Collection Time: 08/28/23  8:41 PM   Specimen: Nasopharyngeal Swab; Respiratory  Result Value Ref Range Status   Adenovirus NOT DETECTED NOT DETECTED Final   Coronavirus 229E NOT DETECTED NOT DETECTED Final    Comment: (NOTE) The Coronavirus on the Respiratory Panel, DOES NOT test for the novel  Coronavirus (2019 nCoV)    Coronavirus HKU1 NOT DETECTED  NOT DETECTED Final   Coronavirus NL63 NOT DETECTED NOT DETECTED Final   Coronavirus OC43 NOT DETECTED NOT DETECTED Final   Metapneumovirus NOT DETECTED NOT DETECTED Final   Rhinovirus / Enterovirus NOT DETECTED NOT DETECTED Final   Influenza A NOT DETECTED NOT DETECTED Final   Influenza B NOT DETECTED NOT DETECTED Final   Parainfluenza Virus 1 NOT DETECTED NOT DETECTED Final   Parainfluenza Virus 2 NOT DETECTED NOT DETECTED Final   Parainfluenza Virus 3 NOT DETECTED NOT DETECTED Final   Parainfluenza Virus 4 NOT DETECTED NOT DETECTED Final   Respiratory Syncytial Virus NOT DETECTED NOT DETECTED Final   Bordetella pertussis NOT DETECTED NOT DETECTED Final   Bordetella Parapertussis NOT DETECTED NOT DETECTED Final   Chlamydophila pneumoniae NOT DETECTED NOT DETECTED Final   Mycoplasma pneumoniae NOT DETECTED NOT DETECTED Final    Comment: Performed at [facility], [address], [place], [place] [address]  SARS Coronavirus 2 by RT PCR (hospital order, performed in [facility] hospital lab) *cepheid  single result test* Anterior Nasal Swab     Status: None   Collection Time: 08/28/23  8:41 PM   Specimen: Anterior Nasal Swab  Result Value Ref Range Status   SARS Coronavirus 2 by RT PCR NEGATIVE NEGATIVE Final    Comment: Performed at [facility], [address], [place], [place] [address]         Radiology Studies: Overnight EEG with video Result Date: 08/29/2023 Arleene Lack, MD     08/30/2023  9:10 AM Patient Name: Bradie Lacock MRN: [id number] Epilepsy Attending: Arleene Lack Referring Physician/Provider: Ronnette Coke, MD Duration: 08/28/2023 2022 to 08/29/2023 2022 Patient history:  78 y.o. male with hx of dementia, prior CVA with right-sided deficits who was brought to the emergency department for aphasia since 5/19 PM. EEG to evaluate for seizure Level of alertness: Awake, asleep AEDs during EEG study: LEV, PHT Technical aspects: This EEG study was done with scalp electrodes positioned according to the 10-20 International system of electrode placement. Electrical activity was reviewed with band pass filter of 1-70Hz , sensitivity of 7 uV/mm, display speed of 70mm/sec with a 60Hz  notched filter applied as appropriate. EEG data were recorded continuously and digitally stored.  Video monitoring was available and reviewed as appropriate. Description: The posterior dominant rhythm consists of 8 Hz activity of moderate voltage (25-35 uV) seen predominantly in posterior head regions, symmetric and reactive to eye opening and eye closing. Sleep was characterized by vertex waves, sleep spindles (12 to 14 Hz), maximal frontocentral region. EEG showed continuous 3 to 6 Hz theta-delta slowing in right fronto-temporal region. Intermittent generalized 3 to 6 Hz theta-delta slowing was also noted. Hyperventilation and photic stimulation were not performed.  Throughout the study,  eeg showed 7-8hz  alpha activity in left posterior quadrant as well as 3-5hz  high amplitude sharply  contoured theta-delta slowing involving all of left hemisphere. No clinical signs were noted.  Patient was given Keppra 1000 mg on 08/30/2023 at around 0922 without significant change in EEG pattern.  He was then given fosphenytoin 500 mg on 08/30/2023 at around 1823.  Subsequently this EEG pattern resolved .  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  Additionally, EEG was suggestive of cortical dysfunction arising from right hemisphere likely secondary to underlying structural abnormality, post-ictal state.  Lastly there was mild diffuse encephalopathy. Priyanka O Yadav        Scheduled Meds:  aspirin   300 mg Rectal Daily   levETIRAcetam  500 mg Intravenous Q12H   sodium chloride  flush  3 mL Intravenous Q12H   Continuous Infusions:  ampicillin -sulbactam (UNASYN ) IV 3 g (08/31/23 0425)   thiamine (VITAMIN B1) injection       LOS: 3 days    Time spent: 55 minutes    Yumi Insalaco Loran Rock, DO [facility]  If 7PM-7AM, please contact night-coverage [email or web address] 08/31/2023, 6:52 AM

## 2023-08-31 NOTE — Procedures (Signed)
 Patient Name: Christopher Murphy  MRN: [id number]  Epilepsy Attending: Arleene Lack  Referring Physician/Provider: Ronnette Coke, MD  Duration: 08/30/2023 2022 to 08/31/2023 1145   Patient history:  78 y.o. male with hx of dementia, prior CVA with right-sided deficits who was brought to the emergency department for aphasia since 5/19 PM. EEG to evaluate for seizure   Level of alertness: Awake, asleep   AEDs during EEG study: LEV   Technical aspects: This EEG study was done with scalp electrodes positioned according to the 10-20 International system of electrode placement. Electrical activity was reviewed with band pass filter of 1-70Hz , sensitivity of 7 uV/mm, display speed of 22mm/sec with a 60Hz  notched filter applied as appropriate. EEG data were recorded continuously and digitally stored.  Video monitoring was available and reviewed as appropriate.   Description: The posterior dominant rhythm consists of 8 Hz activity of moderate voltage (25-35 uV) seen predominantly in posterior head regions, symmetric and reactive to eye opening and eye closing. Sleep was characterized by vertex waves, sleep spindles (12 to 14 Hz), maximal frontocentral region. EEG showed continuous 3 to 6 Hz theta-delta slowing in right fronto-temporal region. Intermittent rhythmic 3-5hz  theta- delta slowing was also noted in left posterior quadrant.  Hyperventilation and photic stimulation were not performed.     ABNORMALITY - Continuous slow, right hemisphere - Intermittent rhythmic slow, left posterior quadrant   IMPRESSION: This study was suggestive of cortical dysfunction arising from right hemisphere likely secondary to underlying structural abnormality, post-ictal state.  Additionally there was cortical dysfunction in left posterior quadrant likely secondary to underlying structural abnormality.  No definite seizures were noted.   [name] O Aydin Cavalieri

## 2023-08-31 NOTE — Progress Notes (Addendum)
 Subjective: Patient remains hemodynamically stable and afebrile.  Daughter in law at the bedside states that his mental status has not changed.  No seizures seen on LTM EEG.  ROS: Unable to obtain due to poor mental status  Examination  Vital signs in last 24 hours: Temp:  [97.7 F (36.5 C)-98.4 F (36.9 C)] 98.4 F (36.9 C) (05/24 0749) Pulse Rate:  [66-73] 72 (05/24 0749) Resp:  [17-18] 17 (05/24 0411) BP: (128-145)/(66-78) 133/66 (05/24 0749) SpO2:  [96 %-100 %] 96 % (05/24 0749)  General: lying in bed, NAD Neuro: Patient rests with eyes closed and does not open them to voice or touch.  Pupils 5 mm and sluggishly reactive.  With forced eye opening, patient does not track examiner around the room.  He does not respond to voice, even in [place], and does not follow commands.  He will slightly grimace and, move left lower extremity and left upper extremity to sternal rub.  He does not respond to noxious stimulation in the upper extremities but will move bilateral lower extremities in response to noxious.  Basic Metabolic Panel: Recent Labs  Lab 08/27/23 1049 08/27/23 1601 08/28/23 2022 08/29/23 0628 08/30/23 1326 08/31/23 0757  NA 134*  --  132* 134* 137 140  K 3.9  --  3.6 3.4* 3.3* 3.2*  CL 101  --  101 102 106 110  CO2 25  --  19* 21* 21* 22  GLUCOSE 116*  --  110* 121* 112* 92  BUN 11  --  14 14 13 13   CREATININE 0.89 0.86 0.85 0.81 0.78 0.81  CALCIUM  8.9  --  8.5* 8.4* 8.3* 8.2*  MG  --   --  1.9 2.0  --   --   PHOS  --   --  2.9 2.4*  --   --     CBC: Recent Labs  Lab 08/27/23 1049 08/27/23 1601 08/28/23 2022 08/29/23 0628 08/30/23 1326 08/31/23 0757  WBC 12.4* 11.1* 10.3 10.2 10.8* 9.0  NEUTROABS 9.0*  --   --   --  7.4 5.9  HGB 12.9* 12.9* 12.8* 12.1* 12.2* 11.6*  HCT 40.5 39.2 38.7* 36.1* 36.9* 34.7*  MCV 81.2 80.0 79.8* 79.2* 79.7* 79.8*  PLT 308 290 250 263 249 253   B12 398 Thiamine pending Meningitis/encephalitis panel negative CSF glucose  70 CSF protein 62 CSF RBC 685 CSF WBC 2  Imaging No new brain imaging    ASSESSMENT AND PLAN:  78 year old male with history of dementia, prior stroke with right-sided deficits who presented with speech disturbance and altered mental status since 08/27/2023.  Lumbar puncture was performed yesterday to rule out CNS infection.  Meningitis/encephalitis panel was negative and WBC was 2.  Protein is slightly elevated at 62, but this is likely due to the also elevated RBC.    Patient continues to fluctuate on exam with variable responsiveness.  EEG this morning shows some slowing but no seizure activity.  Will discontinue long-term EEG as no seizure activity seen over the last 24 hours.   Acute encephalopathy Aphasia - Encephalopathy and aphasia along with MRI and EEG findings concerning for seizures   Recommendations - Continue Keppra 500 mg twice daily - Discontinue long-term EEG - Continue seizure precautions - prn versed  for clinical seizure lasting more than 2 minutes - Discussed plan with DIL at bedside and son via phone - Discussed with family, appropriate for transition to comfort care   Patient seen by NP and then by MD, MD  to edit note as needed. Cortney E Bucky Cardinal , MSN, AGACNP-BC [facility] See Amion for schedule and pager information 08/31/2023 11:49 AM  Attending Neurologist's note:  I personally saw this patient, gathering history, performing a full neurologic examination, reviewing relevant labs, personally reviewing relevant imaging including MRI brain, and formulated the assessment and plan, adding the note above for completeness and clarity to accurately reflect my thoughts  On exam, frail appearing Punjabi man, opens eyes slightly to voice and makes eye contact briefly.  Per daughter moves his lips briefly when she asks simple questions but no intelligible speech.  Moves arms slightly to light touch.  Confirmed their previous discussions with Dr.  Merceda Stairs and agree that at this time other treatable etiologies of his change in mental status have been ruled out with reassuring lumbar puncture.  He may have a very prolonged recovery from the focal status that he was in due to poor brain reserve with his underlying brain injury.  He may also not fully recover.   Dr. Merceda Stairs will reassess on Monday   Baldwin Levee MD-PhD [facility] [phone] Available 7 AM to 7 PM, outside these hours please contact Neurologist on call listed on AMION   Greater than 50 minutes spent in care of patient today, majority in discussion with family

## 2023-08-31 NOTE — Progress Notes (Signed)
 LTM VIDEO EEG discontinued - no skin breakdown at [facility]. [facility] advised

## 2023-08-31 NOTE — Plan of Care (Signed)
  Problem: Clinical Measurements: Goal: Respiratory complications will improve Outcome: Progressing   Problem: Elimination: Goal: Will not experience complications related to urinary retention Outcome: Progressing   Problem: Pain Managment: Goal: General experience of comfort will improve and/or be controlled Outcome: Progressing   Problem: Safety: Goal: Ability to remain free from injury will improve Outcome: Progressing   Problem: Skin Integrity: Goal: Risk for impaired skin integrity will decrease Outcome: Progressing

## 2023-09-01 DIAGNOSIS — F03C0 Unspecified dementia, severe, without behavioral disturbance, psychotic disturbance, mood disturbance, and anxiety: Secondary | ICD-10-CM

## 2023-09-01 DIAGNOSIS — G9340 Encephalopathy, unspecified: Secondary | ICD-10-CM | POA: Diagnosis not present

## 2023-09-01 DIAGNOSIS — Z515 Encounter for palliative care: Secondary | ICD-10-CM

## 2023-09-01 LAB — BASIC METABOLIC PANEL WITH GFR
Anion gap: 9 (ref 5–15)
BUN: 14 mg/dL (ref 8–23)
CO2: 21 mmol/L — ABNORMAL LOW (ref 22–32)
Calcium: 8.2 mg/dL — ABNORMAL LOW (ref 8.9–10.3)
Chloride: 109 mmol/L (ref 98–111)
Creatinine, Ser: 0.76 mg/dL (ref 0.61–1.24)
GFR, Estimated: >60 mL/min (ref >60)
Glucose, Bld: 98 mg/dL (ref 70–99)
Potassium: 3 mmol/L — ABNORMAL LOW (ref 3.5–5.1)
Sodium: 139 mmol/L (ref 135–145)

## 2023-09-01 LAB — CBC
HCT: 33.2 % — ABNORMAL LOW (ref 39.0–52.0)
Hemoglobin: 11 g/dL — ABNORMAL LOW (ref 13.0–17.0)
MCH: 26.4 pg (ref 26.0–34.0)
MCHC: 33.1 g/dL (ref 30.0–36.0)
MCV: 79.6 fL — ABNORMAL LOW (ref 80.0–100.0)
Platelets: 250 10*3/uL (ref 150–400)
RBC: 4.17 MIL/uL — ABNORMAL LOW (ref 4.22–5.81)
RDW: 13.9 % (ref 11.5–15.5)
WBC: 8.2 10*3/uL (ref 4.0–10.5)
nRBC: 0 % (ref 0.0–0.2)

## 2023-09-01 LAB — GLUCOSE, CAPILLARY
Glucose-Capillary: 96 mg/dL (ref 70–99)
Glucose-Capillary: 95 mg/dL (ref 70–99)

## 2023-09-01 LAB — MAGNESIUM: Magnesium: 2 mg/dL (ref 1.7–2.4)

## 2023-09-01 LAB — PHOSPHORUS: Phosphorus: 2.7 mg/dL (ref 2.5–4.6)

## 2023-09-01 MED ORDER — DEXTROSE-SODIUM CHLORIDE 5-0.9 % IV SOLN: INTRAVENOUS | Status: AC

## 2023-09-01 MED ORDER — OSMOLITE 1.2 CAL PO LIQD
1000.0000 mL | ORAL | Status: DC
  Administered 2023-09-03 – 2023-09-18 (×14): 1000 mL
  Filled 2023-09-01 (×25): qty 1000

## 2023-09-01 MED ORDER — PROSOURCE TF20 ENFIT COMPATIBL EN LIQD
60.0000 mL | Freq: Every day | ENTERAL | Status: DC
  Administered 2023-09-03 – 2023-09-18 (×16): 60 mL
  Filled 2023-09-01 (×16): qty 60

## 2023-09-01 MED ORDER — DEXTROSE-SODIUM CHLORIDE 5-0.9 % IV SOLN
INTRAVENOUS | Status: DC
Start: 2023-09-01 — End: 2023-09-01

## 2023-09-01 MED ORDER — ADULT MULTIVITAMIN W/MINERALS CH
1.0000 | ORAL_TABLET | Freq: Every day | ORAL | Status: DC
  Administered 2023-09-04 – 2023-09-18 (×15): 1
  Filled 2023-09-01 (×15): qty 1

## 2023-09-01 MED ORDER — POTASSIUM CHLORIDE 10 MEQ/100ML IV SOLN
10.0000 meq | INTRAVENOUS | Status: AC
  Administered 2023-09-01 (×6): 10 meq via INTRAVENOUS
  Filled 2023-09-01 (×6): qty 100

## 2023-09-01 NOTE — Consult Note (Signed)
 Consultation Note Date: 09/01/2023   Patient Name: Christopher Murphy  DOB: 07/16/1945  MRN: [id number]  Age / Sex: 78 y.o., male  PCP: Carollynn Cirri, NP Referring Physician: Cornelius Dill, DO  Reason for Consultation: Establishing goals of care  HPI/Patient Profile: 78 y.o. male   admitted on 08/28/2023 with past medical history of  vascular dementia, prior CVA with right-sided hemiplegia, HLD, aortic aneurysm, who is transferred from [facility] for altered mental status.    He has been admitted with acute encephalopathy and aphasia started for seizures and remains on IV Keppra which were performed 5/23 for further evaluation per neurology.    He was evaluated with long-term EEG with no seizure activity seen, but appears to have no improvement in mentation.    Patient remains minimally responsive to vigorous stimulation, unable to follow commands.   Family face treatment option decisions, advanced directive decisions and anticipatory care needs.    Clinical Assessment and Goals of Care:   This NP [name] reviewed medical records, received report from team, assessed the patient and then meet at the patient's bedside along his daughter in law/ Macel Sawyers initially and then later in the morning  with his son/ [name] Zelda Hickman  to discuss diagnosis, prognosis, GOC, EOL wishes disposition and options.   Concept of Palliative Care was introduced as specialized medical care for people and their families living with serious illness.  If focuses on providing relief from the symptoms and stress of a serious illness.  The goal is to improve quality of life for both the patient and the family.  Values and goals of care important to patient and family were attempted to be elicited.  Education offered on patient's current medical situation.  We spoke specifically to his history of dementia and prior stroke and more  recent workup; lumbar puncture was negative meningitis/encephalitis panel negative, along with EEG findings concerning for seizure.  Created space and opportunity for family to explore thoughts and feelings regarding current medical situation.  Patient's son is searching for a more definitive answer for the rapid cognitive changes.   Re-explored  Dr. Nicholes Barks concern for prolonged recovery from focal status secondary to poor brain reserve with his underlying brain injuries.   A  discussion was had today regarding advanced directives.  Concepts specific to code status, artifical feeding and hydration, continued IV antibiotics and rehospitalization was had.    Education offered on risks and benefits of artificial feeding both temporary and long-term.   The difference between a aggressive medical intervention path  and a palliative comfort care path for this patient at this time was had.   Education offered on hospice benefit philosophy and eligibility          I returned later in the afternoon and spoke with son at bedside.  He told me that he spoke again with Dr. Mason Sole.  Family has made decision to move forward with temporary trial of artificial feeding with a core track.  Family need time for outcomes before  making any further definitive end-of-life decisions.  MOST form introduced, Hard Choices booklet left for review     Questions and concerns addressed.  Patient  encouraged to call with questions or concerns.     PMT will continue to support holistically.            No advanced care planning documents secured.    Patient's only son/ [name] Zelda Hickman is main Management consultant.       SUMMARY OF RECOMMENDATIONS    Code Status/Advance Care Planning: Limited code     Palliative Prophylaxis:  Aspiration, Delirium Protocol, and Frequent Pain Assessment  Additional Recommendations (Limitations, Scope, Preferences): Full Comfort Care  Psycho-social/Spiritual:   Additional  Recommendations: emotional support  Prognosis:  Unable to determine  Discharge Planning: To Be Determined      Primary Diagnoses: Present on Admission:  Encephalopathy   I have reviewed the medical record, interviewed the patient and family, and examined the patient. The following aspects are pertinent.  Past Medical History:  Diagnosis Date   Blood clots in brain 2009   Stroke [facility])    Social History   Socioeconomic History   Marital status: Married    Spouse name: Not on file   Number of children: Not on file   Years of education: Not on file   Highest education level: Bachelor's degree (e.g., BA, AB, BS)  Occupational History   Not on file  Tobacco Use   Smoking status: Never   Smokeless tobacco: Never  Vaping Use   Vaping status: Never Used  Substance and Sexual Activity   Alcohol  use: No   Drug use: No   Sexual activity: Not on file  Other Topics Concern   Not on file  Social History Narrative   Lives with wife, son and daughter in law + 2 children   Right Handed   Drinks no caffeine   Social Drivers of Corporate investment banker Strain: Low Risk  (06/26/2023)   Overall Financial Resource Strain (CARDIA)    Difficulty of Paying Living Expenses: Not hard at all  Food Insecurity: No Food Insecurity (08/29/2023)   Hunger Vital Sign    Worried About Running Out of Food in the Last Year: Never true    Ran Out of Food in the Last Year: Never true  Transportation Needs: No Transportation Needs (08/29/2023)   PRAPARE - Administrator, Civil Service (Medical): No    Lack of Transportation (Non-Medical): No  Physical Activity: Insufficiently Active (06/26/2023)   Exercise Vital Sign    Days of Exercise per Week: 4 days    Minutes of Exercise per Session: 10 min  Stress: No Stress Concern Present (06/26/2023)   [employer] of Occupational Health - Occupational Stress Questionnaire    Feeling of Stress : Not at all  Social Connections: Unknown  (08/29/2023)   Social Connection and Isolation Panel [NHANES]    Frequency of Communication with Friends and Family: Once a week    Frequency of Social Gatherings with Friends and Family: Once a week    Attends Religious Services: Patient unable to answer    Active Member of Clubs or Organizations: No    Attends Banker Meetings: Never    Marital Status: Married   Family History  Problem Relation Age of Onset   AAA (abdominal aortic aneurysm) Neg Hx    Scheduled Meds:  aspirin   300 mg Rectal Daily   levETIRAcetam  500 mg Intravenous Q12H  sodium chloride  flush  3 mL Intravenous Q12H   Continuous Infusions:  ampicillin -sulbactam (UNASYN ) IV 3 g (09/01/23 0422)   thiamine (VITAMIN B1) injection 250 mg (09/01/23 0810)   PRN Meds:.acetaminophen  Medications Prior to Admission:  Prior to Admission medications   Medication Sig Start Date End Date Taking? Authorizing Provider  acetaminophen  (TYLENOL ) 650 MG suppository Place 1 suppository (650 mg total) rectally every 4 (four) hours as needed for mild pain (pain score 1-3) (or temp > 37.5 C (99.5 F)). 08/28/23   Verla Glaze, MD  aspirin  300 MG suppository Place 1 suppository (300 mg total) rectally daily. 08/29/23   Verla Glaze, MD  cefTRIAXone 2 g in sodium chloride  0.9 % 100 mL Inject 2 g into the vein daily. 08/29/23   Verla Glaze, MD  enoxaparin  (LOVENOX ) 40 MG/0.4ML injection Inject 0.4 mLs (40 mg total) into the skin daily. 08/28/23   Verla Glaze, MD  Polyethyl Glycol-Propyl Glycol (SYSTANE) 0.4-0.3 % SOLN Place 1 drop into both eyes 2 (two) times daily. 08/28/23   Verla Glaze, MD  sodium chloride  0.9 % infusion Inject 50 mLs into the vein continuous. 08/28/23   Verla Glaze, MD   No Known Allergies Review of Systems  Unable to perform ROS: Dementia    Physical Exam Constitutional:      Appearance: He is cachectic. He is ill-appearing.     Comments: Minimally responsive to gentle touch and  verbal stimuli  Cardiovascular:     Rate and Rhythm: Normal rate.  Pulmonary:     Effort: Pulmonary effort is normal.  Musculoskeletal:     Comments: Generalized weakness and muscle atrophy  Skin:    General: Skin is warm and dry.  Neurological:     Mental Status: He is unresponsive.     Vital Signs: BP 134/62 (BP Location: Left Arm)   Pulse 74   Temp 98.4 F (36.9 C) (Axillary)   Resp 18   SpO2 99%  Pain Scale: Faces   Pain Score: 0-No pain   SpO2: SpO2: 99 % O2 Device:SpO2: 99 % O2 Flow Rate: .O2 Flow Rate (L/min): 0 L/min  IO: Intake/output summary:  Intake/Output Summary (Last 24 hours) at 09/01/2023 0846 Last data filed at 09/01/2023 0500 Gross per 24 hour  Intake --  Output 1200 ml  Net -1200 ml    LBM: Last BM Date : 08/28/23 Baseline Weight:   Most recent weight:       Palliative Assessment/Data: 20 %      Time 75 minutes      -- discussed with Dr. Bernetta Brilliant and Dr. Cleone Dad via secure chat   Signed by:  [name], NP   Please contact Palliative Medicine Team phone at [phone] for questions and concerns.  For individual provider: See Tilford Foley

## 2023-09-01 NOTE — Plan of Care (Signed)
  Problem: Nutrition: Goal: Adequate nutrition will be maintained Outcome: Progressing   Problem: Elimination: Goal: Will not experience complications related to bowel motility Outcome: Progressing Goal: Will not experience complications related to urinary retention Outcome: Progressing   Problem: Pain Managment: Goal: General experience of comfort will improve and/or be controlled Outcome: Progressing   Problem: Safety: Goal: Ability to remain free from injury will improve Outcome: Progressing   Problem: Skin Integrity: Goal: Risk for impaired skin integrity will decrease Outcome: Progressing

## 2023-09-01 NOTE — Progress Notes (Signed)
 PROGRESS NOTE    Christopher Murphy  ZOX:[id number] DOB: [birth year] DOA: 08/28/2023 PCP: Carollynn Cirri, NP   Brief Narrative:    78 y.o. male with hx of vascular dementia, prior CVA with right-sided hemiplegia, HLD, aortic aneurysm, who is transferred from [facility] for altered mental status.  He has been admitted with acute encephalopathy and aphasia started for seizures and remains on IV Keppra which were performed 5/23 for further evaluation per neurology.  He was evaluated with long-term EEG with no seizure activity seen, but appears to have no improvement in mentation.  Appreciate further neurology evaluation and palliative consulted for goals of care 5/24. Plan to start tube feedings 5/25.  Assessment & Plan:   Principal Problem:   Encephalopathy Active Problems:   Decreased level of consciousness   Vertebral artery dissection (HCC)  Assessment and Plan:  Acute Encephalopathy-persistent Abnormal eye movement with ? R gaze deviation w/r rapidly worsening mentation and ultimately aphasic and unresponsive: Unclear etiology, r/o nonconvulsive seizure. Neurology following Overall mental status unchanged, unresponsive, able to withdraw some VBG without hypercapnia, TSH lactic acid okay B12 398,will keep >500. Continue high-dose thiamine, n.p.o. 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 LP performed 5/23 with no significant findings Long-term EEG with no seizure activity, appreciate further neurology recommendations Consulted palliative for goals of care Start feeding tube 5/25 to give better chance of recovery   Right vertebral artery dissection Per neuro recs continue DAPT x 90 days followed by Plavix  75  mg daily.  Currently unable to take p.o. Will continue aspirin  300 mg suppository for now.   Fever with unknown source-improved Tmax of 38.1C on 5/20 and WBC 11. per family only localizing symptom had been 1 month of intermittent cough and fevers. So far no evidence of pneumonia normal LFTs. UA not consistent with UTI, . Has mild ethmoid sinusitis on head CT. Repeat chest x-ray negative, COVID RSV respiratory virus panel negative Cont on Unasyn  to cover for aspiration pneumonia with plans to stop 5/26 Patient has undergone LP on 5/23 per neurology with no significant findings   Vascular dementia: On memantine , donepezil  at home.  Held as he is NPO. History of CVA right-sided hemiplegia: On aspirin  PR per above.  Home rosuvastatin  held due to n.p.o. status. LDL 52, A1c 5.5% 3/'25  HLD: Statin held Aortic aneurysm: Noted    DVT prophylaxis: SCDs Code Status: DNR Family Communication: Daughter-in-law Macel Sawyers at bedside 5/25 and son on phone 5/25 Disposition Plan:  Status is: Inpatient Remains inpatient appropriate because: Need for IV medications.   Consultants:  Neurology Palliative Care  Procedures:  Lumbar puncture 5/23  Antimicrobials:  Anti-infectives (From admission, onward)    Start     Dose/Rate Route Frequency Ordered Stop   08/28/23 2145  Ampicillin -Sulbactam (UNASYN ) 3 g in sodium chloride  0.9 % 100 mL IVPB        3 g 200 mL/hr over 30 Minutes Intravenous Every 6 hours 08/28/23 2048 09/02/23 2144       Subjective: Patient seen and evaluated today with no acute events noted in the last 24 hours or overnight events. He continues to remain unresponsive to questioning. Daughter in law at bedside and son who now confirm that they are ok with NG tube feeds.  Objective: Vitals:  08/31/23 2109 08/31/23 2346 09/01/23 0503 09/01/23 0800  BP: (!) 146/76 (!) 140/72 (!) 148/71 134/62  Pulse: 76 75  74  Resp:  17 17 18   Temp: 98.7 F (37.1 C) 98.7 F (37.1 C) 99.4  F (37.4 C) 98.4 F (36.9 C)  TempSrc: Axillary Axillary Axillary Axillary  SpO2: 97% 98%  99%    Intake/Output Summary (Last 24 hours) at 09/01/2023 1118 Last data filed at 09/01/2023 0500 Gross per 24 hour  Intake --  Output 1200 ml  Net -1200 ml   There were no vitals filed for this visit.  Examination:  General exam: Appears somnolent and unresponsive Respiratory system: Clear to auscultation. Respiratory effort normal. Cardiovascular system: S1 & S2 heard, RRR.  Gastrointestinal system: Abdomen is soft Central nervous system: Somnolent Extremities: No edema Skin: No significant lesions noted Foley with clear, yellow urine output    Data Reviewed: I have personally reviewed following labs and imaging studies  CBC: Recent Labs  Lab 08/27/23 1049 08/27/23 1601 08/28/23 2022 08/29/23 0628 08/30/23 1326 08/31/23 0757 09/01/23 0728  WBC 12.4*   < > 10.3 10.2 10.8* 9.0 8.2  NEUTROABS 9.0*  --   --   --  7.4 5.9  --   HGB 12.9*   < > 12.8* 12.1* 12.2* 11.6* 11.0*  HCT 40.5   < > 38.7* 36.1* 36.9* 34.7* 33.2*  MCV 81.2   < > 79.8* 79.2* 79.7* 79.8* 79.6*  PLT 308   < > 250 263 249 253 250   < > = values in this interval not displayed.   Basic Metabolic Panel: Recent Labs  Lab 08/28/23 2022 08/29/23 0628 08/30/23 1326 08/31/23 0757 09/01/23 0728  NA 132* 134* 137 140 139  K 3.6 3.4* 3.3* 3.2* 3.0*  CL 101 102 106 110 109  CO2 19* 21* 21* 22 21*  GLUCOSE 110* 121* 112* 92 98  BUN 14 14 13 13 14   CREATININE 0.85 0.81 0.78 0.81 0.76  CALCIUM  8.5* 8.4* 8.3* 8.2* 8.2*  MG 1.9 2.0  --   --  2.0  PHOS 2.9 2.4*  --   --   --    GFR: Estimated Creatinine Clearance: 74.5 mL/min (by C-G formula based on SCr of 0.76 mg/dL). Liver Function Tests: Recent Labs  Lab 08/27/23 1049 08/28/23 2022 08/30/23 1326 08/31/23 0757  AST 19 17 19 15   ALT 13 11 9 10   ALKPHOS 66 49 46 46  BILITOT 0.8 0.6 0.7 0.8  PROT 7.6 6.9 6.4* 6.0*  ALBUMIN 3.6 2.9* 2.6* 2.4*   No  results for input(s): "LIPASE", "AMYLASE" in the last 168 hours. Recent Labs  Lab 08/27/23 2139  AMMONIA <13   Coagulation Profile: Recent Labs  Lab 08/27/23 1049  INR 1.1   Cardiac Enzymes: No results for input(s): "CKTOTAL", "CKMB", "CKMBINDEX", "TROPONINI" in the last 168 hours. BNP (last 3 results) No results for input(s): "PROBNP" in the last 8760 hours. HbA1C: No results for input(s): "HGBA1C" in the last 72 hours. CBG: Recent Labs  Lab 08/27/23 1039 09/01/23 0920  GLUCAP 112* 96   Lipid Profile: No results for input(s): "CHOL", "HDL", "LDLCALC", "TRIG", "CHOLHDL", "LDLDIRECT" in the last 72 hours. Thyroid  Function Tests: No results for input(s): "TSH", "T4TOTAL", "FREET4", "T3FREE", "THYROIDAB" in the last 72 hours.  Anemia Panel: No results for input(s): "VITAMINB12", "FOLATE", "FERRITIN", "TIBC", "IRON", "RETICCTPCT" in the last 72 hours.  Sepsis Labs: Recent Labs  Lab 08/28/23 2022  LATICACIDVEN 1.8    Recent  Results (from the past 240 hours)  Respiratory (~20 pathogens) panel by PCR     Status: None   Collection Time: 08/28/23  8:41 PM   Specimen: Nasopharyngeal Swab; Respiratory  Result Value Ref Range Status   Adenovirus NOT DETECTED NOT DETECTED Final   Coronavirus 229E NOT DETECTED NOT DETECTED Final    Comment: (NOTE) The Coronavirus on the Respiratory Panel, DOES NOT test for the novel  Coronavirus (2019 nCoV)    Coronavirus HKU1 NOT DETECTED NOT DETECTED Final   Coronavirus NL63 NOT DETECTED NOT DETECTED Final   Coronavirus OC43 NOT DETECTED NOT DETECTED Final   Metapneumovirus NOT DETECTED NOT DETECTED Final   Rhinovirus / Enterovirus NOT DETECTED NOT DETECTED Final   Influenza A NOT DETECTED NOT DETECTED Final   Influenza B NOT DETECTED NOT DETECTED Final   Parainfluenza Virus 1 NOT DETECTED NOT DETECTED Final   Parainfluenza Virus 2 NOT DETECTED NOT DETECTED Final   Parainfluenza Virus 3 NOT DETECTED NOT DETECTED Final   Parainfluenza  Virus 4 NOT DETECTED NOT DETECTED Final   Respiratory Syncytial Virus NOT DETECTED NOT DETECTED Final   Bordetella pertussis NOT DETECTED NOT DETECTED Final   Bordetella Parapertussis NOT DETECTED NOT DETECTED Final   Chlamydophila pneumoniae NOT DETECTED NOT DETECTED Final   Mycoplasma pneumoniae NOT DETECTED NOT DETECTED Final    Comment: Performed at [facility], [address], [place], [place] [address]  SARS Coronavirus 2 by RT PCR (hospital order, performed in [facility] hospital lab) *cepheid single result test* Anterior Nasal Swab     Status: None   Collection Time: 08/28/23  8:41 PM   Specimen: Anterior Nasal Swab  Result Value Ref Range Status   SARS Coronavirus 2 by RT PCR NEGATIVE NEGATIVE Final    Comment: Performed at [facility], [address], [place], [place] [address]         Radiology Studies: No results found.       Scheduled Meds:  aspirin   300 mg Rectal Daily   levETIRAcetam  500 mg Intravenous Q12H   sodium chloride  flush  3 mL Intravenous Q12H   Continuous Infusions:  ampicillin -sulbactam (UNASYN ) IV 3 g (09/01/23 0944)   dextrose  5 % and 0.9 % NaCl     potassium chloride      thiamine (VITAMIN B1) injection 250 mg (09/01/23 0810)     LOS: 4 days    Time spent: 55 minutes    Tytus Strahle D Mason Sole, DO [facility]  If 7PM-7AM, please contact night-coverage [email or web address] 09/01/2023, 11:18 AM

## 2023-09-01 NOTE — Progress Notes (Signed)
 Initial Nutrition Assessment  DOCUMENTATION CODES:   Not applicable  INTERVENTION:   Initiate tube feeding via NG/Cortrak tube: Osmolite 1.2 at 20 ml/h and increase by 10 ml every 8 hours to goal rate of 60 ml/hr (1440 ml per day)  Prosource TF20 60 ml daily  Provides 1808 kcal, 100 gm protein, 1166 ml free water daily  Continue thiamine daily   MVI with minerals daily  Monitor magnesium and phosphorus daily x 4 occurrences, MD to replete as needed, as pt is at risk for refeeding syndrome given suspected poor nutrition PTA.   NUTRITION DIAGNOSIS:   Inadequate oral intake related to chronic illness (dementia) as evidenced by NPO status.  GOAL:   Patient will meet greater than or equal to 90% of their needs  MONITOR:   Diet advancement, TF tolerance  REASON FOR ASSESSMENT:   Consult Enteral/tube feeding initiation and management  ASSESSMENT:   Pt with PMH of vascular dementia, prior CVA with right-sided hemiplegia, HLD, aortic aneurysm admitted with acute encephalopathy.   Pt treated for seizures on LTM EEG, no seizures per neurology. Noted palliative care consulted and per neurology pt is appropriate for transition to comfort care.   Medications reviewed and include:  D5 NS @ 50 ml/hr 10 mEq KCl x 6 Thiamine   Labs reviewed:  K 3.0 Vitamin B 12 398  NUTRITION - FOCUSED PHYSICAL EXAM:  Deferred to follow up  Diet Order:   Diet Order             Diet NPO time specified  Diet effective now                   EDUCATION NEEDS:   Not appropriate for education at this time  Skin:     Last BM:  5/21  Height:   Ht Readings from Last 1 Encounters:  08/27/23 5\' 10"  (1.778 m)    Weight:   Wt Readings from Last 1 Encounters:  08/27/23 68.1 kg    BMI:  There is no height or weight on file to calculate BMI.  Estimated Nutritional Needs:   Kcal:  1700-1900  Protein:  85-100 grams  Fluid:  > 1.7 L/day  Randine Butcher., RD, LDN, CNSC See  AMiON for contact information

## 2023-09-02 DIAGNOSIS — R569 Unspecified convulsions: Secondary | ICD-10-CM | POA: Diagnosis not present

## 2023-09-02 DIAGNOSIS — G9340 Encephalopathy, unspecified: Secondary | ICD-10-CM | POA: Diagnosis not present

## 2023-09-02 LAB — BASIC METABOLIC PANEL WITH GFR
Anion gap: 8 (ref 5–15)
BUN: 13 mg/dL (ref 8–23)
CO2: 21 mmol/L — ABNORMAL LOW (ref 22–32)
Calcium: 7.9 mg/dL — ABNORMAL LOW (ref 8.9–10.3)
Chloride: 109 mmol/L (ref 98–111)
Creatinine, Ser: 0.76 mg/dL (ref 0.61–1.24)
GFR, Estimated: >60 mL/min (ref >60)
Glucose, Bld: 97 mg/dL (ref 70–99)
Potassium: 3.2 mmol/L — ABNORMAL LOW (ref 3.5–5.1)
Sodium: 138 mmol/L (ref 135–145)

## 2023-09-02 LAB — CBC
HCT: 33.6 % — ABNORMAL LOW (ref 39.0–52.0)
Hemoglobin: 11.1 g/dL — ABNORMAL LOW (ref 13.0–17.0)
MCH: 26.7 pg (ref 26.0–34.0)
MCHC: 33 g/dL (ref 30.0–36.0)
MCV: 80.8 fL (ref 80.0–100.0)
Platelets: 238 10*3/uL (ref 150–400)
RBC: 4.16 MIL/uL — ABNORMAL LOW (ref 4.22–5.81)
RDW: 13.9 % (ref 11.5–15.5)
WBC: 7.9 10*3/uL (ref 4.0–10.5)
nRBC: 0 % (ref 0.0–0.2)

## 2023-09-02 LAB — MAGNESIUM: Magnesium: 1.9 mg/dL (ref 1.7–2.4)

## 2023-09-02 LAB — GLUCOSE, CAPILLARY
Glucose-Capillary: 109 mg/dL — ABNORMAL HIGH (ref 70–99)
Glucose-Capillary: 100 mg/dL — ABNORMAL HIGH (ref 70–99)
Glucose-Capillary: 99 mg/dL (ref 70–99)
Glucose-Capillary: 111 mg/dL — ABNORMAL HIGH (ref 70–99)

## 2023-09-02 LAB — FOLATE: Folate: 21.8 ng/mL (ref >5.9)

## 2023-09-02 LAB — PHOSPHORUS: Phosphorus: 2.5 mg/dL (ref 2.5–4.6)

## 2023-09-02 MED ORDER — LEVETIRACETAM (KEPPRA) 500 MG/5 ML ADULT IV PUSH
250.0000 mg | Freq: Two times a day (BID) | INTRAVENOUS | Status: DC
  Administered 2023-09-02 – 2023-09-03 (×3): 250 mg via INTRAVENOUS
  Filled 2023-09-02 (×4): qty 2.5

## 2023-09-02 MED ORDER — POTASSIUM CHLORIDE 10 MEQ/100ML IV SOLN
10.0000 meq | INTRAVENOUS | Status: AC
  Administered 2023-09-02 (×6): 10 meq via INTRAVENOUS
  Filled 2023-09-02 (×5): qty 100

## 2023-09-02 MED ORDER — DEXTROSE-SODIUM CHLORIDE 5-0.9 % IV SOLN: INTRAVENOUS | Status: AC

## 2023-09-02 MED ORDER — AMANTADINE HCL 100 MG PO CAPS
100.0000 mg | ORAL_CAPSULE | Freq: Two times a day (BID) | ORAL | Status: DC
  Administered 2023-09-03 – 2023-09-18 (×31): 100 mg
  Filled 2023-09-02 (×33): qty 1

## 2023-09-02 MED ORDER — HYDRALAZINE HCL 20 MG/ML IJ SOLN
10.0000 mg | Freq: Four times a day (QID) | INTRAMUSCULAR | Status: DC | PRN
  Filled 2023-09-02: qty 1

## 2023-09-02 NOTE — Plan of Care (Signed)

## 2023-09-02 NOTE — Progress Notes (Signed)
 Subjective: NAEO. Still pretty drowsy, doesn't open eyes.  Daughter-in-law at bedside  ROS: Unable to obtain due to poor mental status  Examination  Vital signs in last 24 hours: Temp:  [98.2 F (36.8 C)-99.1 F (37.3 C)] 98.8 F (37.1 C) (05/26 1220) Pulse Rate:  [72-81] 80 (05/26 1220) Resp:  [18-19] 19 (05/26 1220) BP: (138-162)/(73-87) 147/81 (05/26 1220) SpO2:  [95 %-98 %] 98 % (05/26 1220) Weight:  [67.8 kg] 67.8 kg (05/26 0705)  General: lying in bed, NAD Neuro: Asleep, does barely open eyes to repeated tactile stimulation, did squeeze my hand but did not follow the commands  Basic Metabolic Panel: Recent Labs  Lab 08/28/23 2022 08/29/23 0628 08/30/23 1326 08/31/23 0757 09/01/23 0728 09/01/23 1434 09/02/23 0534  NA 132* 134* 137 140 139  --  138  K 3.6 3.4* 3.3* 3.2* 3.0*  --  3.2*  CL 101 102 106 110 109  --  109  CO2 19* 21* 21* 22 21*  --  21*  GLUCOSE 110* 121* 112* 92 98  --  97  BUN 14 14 13 13 14   --  13  CREATININE 0.85 0.81 0.78 0.81 0.76  --  0.76  CALCIUM  8.5* 8.4* 8.3* 8.2* 8.2*  --  7.9*  MG 1.9 2.0  --   --  2.0  --  1.9  PHOS 2.9 2.4*  --   --   --  2.7 2.5    CBC: Recent Labs  Lab 08/27/23 1049 08/27/23 1601 08/29/23 0628 08/30/23 1326 08/31/23 0757 09/01/23 0728 09/02/23 0534  WBC 12.4*   < > 10.2 10.8* 9.0 8.2 7.9  NEUTROABS 9.0*  --   --  7.4 5.9  --   --   HGB 12.9*   < > 12.1* 12.2* 11.6* 11.0* 11.1*  HCT 40.5   < > 36.1* 36.9* 34.7* 33.2* 33.6*  MCV 81.2   < > 79.2* 79.7* 79.8* 79.6* 80.8  PLT 308   < > 263 249 253 250 238   < > = values in this interval not displayed.     Coagulation Studies: No results for input(s): "LABPROT", "INR" in the last 72 hours.  Imaging No new brain imaging overnight  ASSESSMENT AND PLAN:78 year old male with history of dementia, prior stroke with right-sided deficits who presented with speech disturbance and altered mental status since 08/27/2023.   Acute encephalopathy Aphasia -  Encephalopathy and aphasia along with MRI and EEG findings concerning for seizures   Recommendations - Reduce Keppra to 250 mg twice daily to minimize sedation Can start amantadine 100 mg twice daily for neurostimulation once tube is placed -Discussed with son on phone and daughter-in-law at bedside that typically these medications take a few days to work.  Therefore we can wait for about 72 hours after starting medications to see if there is improvement in mental status.  If mental status does not improve, family is considering comfort which I think is  - Continue seizure precautions - prn versed  for clinical seizure lasting more than 2 minutes - Discussed plan with DIL at bedside -Discussed plan with Dr. Mason Sole via secure chat   I have spent a total of  35 minutes with the patient reviewing hospital notes,  test results, labs and examining the patient as well as establishing an assessment and plan.  > 50% of time was spent in direct patient care.    Jakota Manthei Epilepsy [facility] For questions after 5pm please refer to AMION to reach  the Neurologist on call

## 2023-09-02 NOTE — Progress Notes (Signed)
 PROGRESS NOTE    Christopher Murphy  RUE:[id number] DOB: [birth year] DOA: 08/28/2023 PCP: Carollynn Cirri, NP   Brief Narrative:    78 y.o. male with hx of vascular dementia, prior CVA with right-sided hemiplegia, HLD, aortic aneurysm, who is transferred from [facility] for altered mental status.  He has been admitted with acute encephalopathy and aphasia secondary to seizures noted on EEG 5/22 and remains on IV Keppra which were performed 5/23 for further evaluation per neurology.  He was evaluated with long-term EEG with no further seizure activity seen, but appears to have no improvement in mentation.  Appreciate further neurology evaluation and palliative consulted for goals of care 5/24. Plan was to try and start tube feedings on 5/25, however core track placement was not available.  Continue IV fluid with bicarb for now until this can be placed.  Plan is to continue to see if prognosis will improve over the next several days and if he becomes more alert and awake.  Currently remains postictal from seizure noted 5/22.  Assessment & Plan:   Principal Problem:   Encephalopathy Active Problems:   Decreased level of consciousness   Vertebral artery dissection (HCC)  Assessment and Plan:  Acute Encephalopathy-persistent likely secondary to postictal state Abnormal eye movement with ? R gaze deviation w/r rapidly worsening mentation and ultimately aphasic and unresponsive: Unclear etiology, r/o nonconvulsive seizure. Neurology following Overall mental status unchanged, unresponsive, able to withdraw some VBG without hypercapnia, TSH lactic acid okay B12 398,will keep >500. Continue high-dose thiamine, n.p.o. 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 LP performed 5/23 with no significant findings Long-term EEG with no seizure activity, appreciate further neurology recommendations Consulted palliative for goals of care Plan was to initiate tube feeding 5/25, however core track team is not currently available.  Continue IV fluid with D5 until this can be placed   Right vertebral artery dissection Per neuro recs continue DAPT x 90 days followed by Plavix  75 mg daily.  Currently unable to take p.o. Will continue aspirin  300 mg suppository for now.   Fever with unknown source-resolved Tmax of 38.1C on 5/20 and WBC 11. per family only localizing symptom had been 1 month of intermittent cough and fevers. So far no evidence of pneumonia normal LFTs. UA not consistent with UTI, . Has mild ethmoid sinusitis on head CT. Repeat chest x-ray negative, COVID RSV respiratory virus panel negative Cont on Unasyn  to cover for aspiration pneumonia with plans to stop today 5/26 Patient has undergone LP on 5/23 per neurology with no significant findings   Vascular dementia: On memantine , donepezil  at home.  Held as he is NPO. History of CVA right-sided hemiplegia: On aspirin  PR per above.  Home rosuvastatin  held due to n.p.o. status. LDL 52, A1c 5.5% 3/'25  HLD: Statin held Aortic aneurysm: Noted    DVT prophylaxis: SCDs Code Status: DNR Family Communication: Daughter-in-law Macel Sawyers at bedside 5/26 and son on phone 5/26 Disposition Plan:  Status is: Inpatient Remains inpatient appropriate because: Need for IV medications.   Consultants:  Neurology Palliative Care  Procedures:  Lumbar puncture 5/23  Antimicrobials:  Anti-infectives (From admission, onward)    Start     Dose/Rate Route Frequency Ordered Stop   08/28/23 2145  Ampicillin -Sulbactam (UNASYN ) 3 g in sodium  chloride 0.9 % 100 mL IVPB        3 g 200 mL/hr over 30 Minutes Intravenous Every 6 hours 08/28/23 2048 09/02/23 2144        Subjective: Patient seen and evaluated today with no acute events noted in the last 24 hours or overnight events. He continues to remain unresponsive to questioning. Daughter in law at bedside and son continue to remain hopeful for recovery.  Unfortunately, feeding tube could not be placed given lack of available services over a long weekend.  He remains on IV fluid.  Objective: Vitals:   09/01/23 2105 09/02/23 0529 09/02/23 0705 09/02/23 0807  BP: (!) 156/87 (!) 162/74  138/78  Pulse: 78 79  72  Resp: 18 18  18   Temp: 98.8 F (37.1 C) 98.2 F (36.8 C)  99.1 F (37.3 C)  TempSrc: Axillary     SpO2: 96% 98%  95%  Weight:   67.8 kg     Intake/Output Summary (Last 24 hours) at 09/02/2023 1010 Last data filed at 09/02/2023 0816 Gross per 24 hour  Intake 3931.22 ml  Output 1550 ml  Net 2381.22 ml   Filed Weights   09/02/23 0705  Weight: 67.8 kg    Examination:  General exam: Appears somnolent and unresponsive Respiratory system: Clear to auscultation. Respiratory effort normal. Cardiovascular system: S1 & S2 heard, RRR.  Gastrointestinal system: Abdomen is soft Central nervous system: Somnolent Extremities: No edema Skin: No significant lesions noted Foley with clear, yellow urine output    Data Reviewed: I have personally reviewed following labs and imaging studies  CBC: Recent Labs  Lab 08/27/23 1049 08/27/23 1601 08/29/23 0628 08/30/23 1326 08/31/23 0757 09/01/23 0728 09/02/23 0534  WBC 12.4*   < > 10.2 10.8* 9.0 8.2 7.9  NEUTROABS 9.0*  --   --  7.4 5.9  --   --   HGB 12.9*   < > 12.1* 12.2* 11.6* 11.0* 11.1*  HCT 40.5   < > 36.1* 36.9* 34.7* 33.2* 33.6*  MCV 81.2   < > 79.2* 79.7* 79.8* 79.6* 80.8  PLT 308   < > 263 249 253 250 238   < > = values in this interval not displayed.   Basic Metabolic Panel: Recent Labs  Lab 08/28/23 2022 08/29/23 0628 08/30/23 1326 08/31/23 0757 09/01/23 0728 09/01/23 1434 09/02/23 0534  NA 132* 134* 137 140 139   --  138  K 3.6 3.4* 3.3* 3.2* 3.0*  --  3.2*  CL 101 102 106 110 109  --  109  CO2 19* 21* 21* 22 21*  --  21*  GLUCOSE 110* 121* 112* 92 98  --  97  BUN 14 14 13 13 14   --  13  CREATININE 0.85 0.81 0.78 0.81 0.76  --  0.76  CALCIUM  8.5* 8.4* 8.3* 8.2* 8.2*  --  7.9*  MG 1.9 2.0  --   --  2.0  --  1.9  PHOS 2.9 2.4*  --   --   --  2.7 2.5   GFR: Estimated Creatinine Clearance: 74.2 mL/min (by C-G formula based on SCr of 0.76 mg/dL). Liver Function Tests: Recent Labs  Lab 08/27/23 1049 08/28/23 2022 08/30/23 1326 08/31/23 0757  AST 19 17 19 15   ALT 13 11 9 10   ALKPHOS 66 49 46 46  BILITOT 0.8 0.6 0.7 0.8  PROT 7.6 6.9 6.4* 6.0*  ALBUMIN 3.6 2.9* 2.6* 2.4*   No results for input(s): "LIPASE", "AMYLASE" in  the last 168 hours. Recent Labs  Lab 08/27/23 2139  AMMONIA <13   Coagulation Profile: Recent Labs  Lab 08/27/23 1049  INR 1.1   Cardiac Enzymes: No results for input(s): "CKTOTAL", "CKMB", "CKMBINDEX", "TROPONINI" in the last 168 hours. BNP (last 3 results) No results for input(s): "PROBNP" in the last 8760 hours. HbA1C: No results for input(s): "HGBA1C" in the last 72 hours. CBG: Recent Labs  Lab 08/27/23 1039 09/01/23 0920 09/01/23 1703 09/02/23 0828  GLUCAP 112* 96 95 109*   Lipid Profile: No results for input(s): "CHOL", "HDL", "LDLCALC", "TRIG", "CHOLHDL", "LDLDIRECT" in the last 72 hours. Thyroid  Function Tests: No results for input(s): "TSH", "T4TOTAL", "FREET4", "T3FREE", "THYROIDAB" in the last 72 hours.  Anemia Panel: No results for input(s): "VITAMINB12", "FOLATE", "FERRITIN", "TIBC", "IRON", "RETICCTPCT" in the last 72 hours.  Sepsis Labs: Recent Labs  Lab 08/28/23 2022  LATICACIDVEN 1.8    Recent Results (from the past 240 hours)  Respiratory (~20 pathogens) panel by PCR     Status: None   Collection Time: 08/28/23  8:41 PM   Specimen: Nasopharyngeal Swab; Respiratory  Result Value Ref Range Status   Adenovirus NOT DETECTED NOT  DETECTED Final   Coronavirus 229E NOT DETECTED NOT DETECTED Final    Comment: (NOTE) The Coronavirus on the Respiratory Panel, DOES NOT test for the novel  Coronavirus (2019 nCoV)    Coronavirus HKU1 NOT DETECTED NOT DETECTED Final   Coronavirus NL63 NOT DETECTED NOT DETECTED Final   Coronavirus OC43 NOT DETECTED NOT DETECTED Final   Metapneumovirus NOT DETECTED NOT DETECTED Final   Rhinovirus / Enterovirus NOT DETECTED NOT DETECTED Final   Influenza A NOT DETECTED NOT DETECTED Final   Influenza B NOT DETECTED NOT DETECTED Final   Parainfluenza Virus 1 NOT DETECTED NOT DETECTED Final   Parainfluenza Virus 2 NOT DETECTED NOT DETECTED Final   Parainfluenza Virus 3 NOT DETECTED NOT DETECTED Final   Parainfluenza Virus 4 NOT DETECTED NOT DETECTED Final   Respiratory Syncytial Virus NOT DETECTED NOT DETECTED Final   Bordetella pertussis NOT DETECTED NOT DETECTED Final   Bordetella Parapertussis NOT DETECTED NOT DETECTED Final   Chlamydophila pneumoniae NOT DETECTED NOT DETECTED Final   Mycoplasma pneumoniae NOT DETECTED NOT DETECTED Final    Comment: Performed at [facility], [address], [place], [place] [address]  SARS Coronavirus 2 by RT PCR (hospital order, performed in [facility] hospital lab) *cepheid single result test* Anterior Nasal Swab     Status: None   Collection Time: 08/28/23  8:41 PM   Specimen: Anterior Nasal Swab  Result Value Ref Range Status   SARS Coronavirus 2 by RT PCR NEGATIVE NEGATIVE Final    Comment: Performed at [facility], [address], [place], [place] [address]         Radiology Studies: No results found.       Scheduled Meds:  aspirin   300 mg Rectal Daily   feeding supplement (PROSource TF20)  60 mL Per Tube Daily   levETIRAcetam  500 mg Intravenous Q12H   multivitamin with minerals  1 tablet Per Tube Daily   sodium chloride  flush  3 mL Intravenous Q12H   Continuous Infusions:  ampicillin -sulbactam (UNASYN ) IV 3 g  (09/02/23 0949)   dextrose  5 % and 0.9 % NaCl 75 mL/hr at 09/02/23 0816   feeding supplement (OSMOLITE 1.2 CAL)     potassium chloride  10 mEq (09/02/23 0822)   thiamine (VITAMIN B1) injection 250 mg (09/02/23  0347)     LOS: 5 days    Time spent: 55 minutes    Lawson Isabell Loran Rock, DO [facility]  If 7PM-7AM, please contact night-coverage [email or web address] 09/02/2023, 10:10 AM

## 2023-09-03 DIAGNOSIS — R569 Unspecified convulsions: Secondary | ICD-10-CM | POA: Diagnosis not present

## 2023-09-03 DIAGNOSIS — G9340 Encephalopathy, unspecified: Secondary | ICD-10-CM | POA: Diagnosis not present

## 2023-09-03 LAB — BASIC METABOLIC PANEL WITH GFR
Anion gap: 9 (ref 5–15)
BUN: 11 mg/dL (ref 8–23)
CO2: 20 mmol/L — ABNORMAL LOW (ref 22–32)
Calcium: 8.1 mg/dL — ABNORMAL LOW (ref 8.9–10.3)
Chloride: 106 mmol/L (ref 98–111)
Creatinine, Ser: 0.73 mg/dL (ref 0.61–1.24)
GFR, Estimated: >60 mL/min (ref >60)
Glucose, Bld: 111 mg/dL — ABNORMAL HIGH (ref 70–99)
Potassium: 3.7 mmol/L (ref 3.5–5.1)
Sodium: 135 mmol/L (ref 135–145)

## 2023-09-03 LAB — MAGNESIUM: Magnesium: 2 mg/dL (ref 1.7–2.4)

## 2023-09-03 LAB — CBC
HCT: 34.3 % — ABNORMAL LOW (ref 39.0–52.0)
Hemoglobin: 11.3 g/dL — ABNORMAL LOW (ref 13.0–17.0)
MCH: 26.7 pg (ref 26.0–34.0)
MCHC: 32.9 g/dL (ref 30.0–36.0)
MCV: 81.1 fL (ref 80.0–100.0)
Platelets: 267 10*3/uL (ref 150–400)
RBC: 4.23 MIL/uL (ref 4.22–5.81)
RDW: 14 % (ref 11.5–15.5)
WBC: 9.1 10*3/uL (ref 4.0–10.5)
nRBC: 0 % (ref 0.0–0.2)

## 2023-09-03 LAB — GLUCOSE, CAPILLARY
Glucose-Capillary: 105 mg/dL — ABNORMAL HIGH (ref 70–99)
Glucose-Capillary: 111 mg/dL — ABNORMAL HIGH (ref 70–99)
Glucose-Capillary: 111 mg/dL — ABNORMAL HIGH (ref 70–99)
Glucose-Capillary: 121 mg/dL — ABNORMAL HIGH (ref 70–99)

## 2023-09-03 LAB — VITAMIN B1: Vitamin B1 (Thiamine): 99.3 nmol/L (ref 66.5–200.0)

## 2023-09-03 LAB — PHOSPHORUS: Phosphorus: 2.5 mg/dL (ref 2.5–4.6)

## 2023-09-03 MED ORDER — SCOPOLAMINE 1 MG/3DAYS TD PT72
1.0000 | MEDICATED_PATCH | TRANSDERMAL | Status: DC
  Administered 2023-09-03 – 2023-09-18 (×6): 1.5 mg via TRANSDERMAL
  Filled 2023-09-03 (×6): qty 1

## 2023-09-03 MED ORDER — DEXTROSE-SODIUM CHLORIDE 5-0.9 % IV SOLN: INTRAVENOUS | Status: AC

## 2023-09-03 NOTE — Procedures (Signed)
 Cortrak  Tube Type:  Cortrak - 43 inches Tube Location:  Right nare Initial Placement:  Stomach Secured by: Bridle Technique Used to Measure Tube Placement:  Marking at nare/corner of mouth Cortrak Secured At:  74 cm   Cortrak Tube Team Note:  Consult received to place a Cortrak feeding tube.   No x-ray is required. RN may begin using tube.   If the tube becomes dislodged please keep the tube and contact the Cortrak team at [email or web address] for replacement.  If after hours and replacement cannot be delayed, place a NG tube and confirm placement with an abdominal x-ray.    Torrance Freestone MS, RD, LDN If unable to be reached, please send secure chat to "RD inpatient" available from 8:00a-4:00p daily

## 2023-09-03 NOTE — Progress Notes (Signed)
 Psychosocial Progressive/Outcome: Pt is mute and unable to follow commands  Family at bedside   Pain/Comfort Progression/Outcome: Pt not able to verbalize pain PAINAD scale was used, no pain based on scale rating   Clinical Progression/Outcome: NPO, TF started Turned and repositioned during shift  Voids to condom cath  No BM  Suction provided  Oral care provided  [facility] maintained above 30 degrees, pt's family educated on reasoning. Pt's family verbalized understanding  Maintained safety

## 2023-09-03 NOTE — Progress Notes (Signed)
 Nutrition Follow-up  DOCUMENTATION CODES:   Severe malnutrition in context of chronic illness  INTERVENTION:  Initiate tube feeding via NG/Cortrak tube: Osmolite 1.2 at 60 ml/h (1440 ml per day)  Initiate at 79ml/hr and increase by 10 ml/hr every 8 hours until goal rate achieved  Prosource TF20 60 ml daily   Provides 1808 kcal, 100 gm protein, 1166 ml free water daily  Continue thiamine  daily   MVI with minerals daily  Monitor magnesium and phosphorus daily x 2 more occurrences, MD to replete as needed  Collect new weight to assess trend   NUTRITION DIAGNOSIS:  Severe Malnutrition related to chronic illness (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 - new dx established s/p NFPE  GOAL:  Patient will meet greater than or equal to 90% of their needs - to be met via TF regimen at goal rate  MONITOR:  Diet advancement, TF tolerance, I & O's, Labs, Weight trends  REASON FOR ASSESSMENT:  Consult Enteral/tube feeding initiation and management  ASSESSMENT:  Pt with PMH of vascular dementia, prior CVA with right-sided hemiplegia, HLD, aortic aneurysm admitted with acute encephalopathy.  Mental status remains unchanged and unresponsive. Significant amount of secretions. PMT has been consulted and GOC discussion ongoing. Waiting to see if mentation approves. Patient's relative at bedside and able to provide nutrition-related history.  24 Hour Recall B: oatmeal and milk OR eggs w/ water L: lentils w/ flatbreak and water D: fruit salad OR skips Snacks: not often  Relative endorses no issues chewing or swallowing at baseline. He does require help with ADLs. R sided weakness at baseline d/t stroke. No significant weight changes endorsed.  He is vegetarian, however family amicable to non-vegetarian options, if they are a better fit for his needs. Pro-State ordered. No skin integrity issues noted on  exam.  Admit/Current Weight: 67.8kg  Needs new weight collection as there has not been one collected since admission. Pt relative unable to report UBW. Per chart review, has shown no significant weight changes in last six months. No edema on exam. Bowels stable. Will monitor s/p initiation of tube feedings.  Drains/Lines: Cortrak (74cm) placed 5/27 External catheter UOP: 1.7L + 1 unmeasured occurrence x24 hours  Has required potassium repletion. Was on D5W while not able to initiate feedings over the weekend. Will continue to monitor for refeeding.   Meds: MVI, thiamine , Keppra , IV ABX  Labs:  Na+ 135 (wdl) K+ 3.0>3.2>3.7 (wdl) PHOS 2.4>2.7>2.5>2.5 (wdl) CBGs 97-111 x24 hours A1c 5.5 (06/2023)  NUTRITION - FOCUSED PHYSICAL EXAM:  Flowsheet Row Most Recent Value  Orbital Region Mild depletion  Upper Arm Region Severe depletion  Thoracic and Lumbar Region Severe depletion  Buccal Region Moderate depletion  Temple Region Moderate depletion  Clavicle Bone Region Severe depletion  Clavicle and Acromion Bone Region Severe depletion  Scapular Bone Region Severe depletion  Dorsal Hand Mild depletion  Patellar Region Moderate depletion  Anterior Thigh Region Severe depletion  Posterior Calf Region Moderate depletion  Edema (RD Assessment) None  Hair Reviewed  Eyes Reviewed  Mouth Reviewed  Skin Reviewed  Nails Reviewed    Diet Order:   Diet Order             Diet NPO time specified  Diet effective now            EDUCATION NEEDS:  Not appropriate for education at this time  Skin:  Skin Assessment: Reviewed RN Assessment  Last  BM:  5/26 - type 5 x1  Height:  Ht Readings from Last 1 Encounters:  08/27/23 5\' 10"  (1.778 m)   Weight:  Wt Readings from Last 1 Encounters:  09/02/23 67.8 kg   BMI:  Body mass index is 21.45 kg/m.  Estimated Nutritional Needs:   Kcal:  1700-1900  Protein:  85-100 grams  Fluid:  > 1.7 L/day  Con Decant MS, RD,  LDN Registered Dietitian Clinical Nutrition RD Inpatient Contact Info in Amion

## 2023-09-03 NOTE — Plan of Care (Signed)

## 2023-09-03 NOTE — Progress Notes (Addendum)
 Subjective: Christopher Murphy. Minimally responsive. DIL at bedside  ROS: Unable to obtain due to poor mental status  Examination  Vital signs in last 24 hours: Temp:  [97.7 F (36.5 C)-99.5 F (37.5 C)] 98.3 F (36.8 C) (05/27 0832) Pulse Rate:  [73-88] 73 (05/27 0832) Resp:  [17-19] 17 (05/27 0832) BP: (144-173)/(77-82) 155/77 (05/27 0832) SpO2:  [93 %-98 %] 98 % (05/27 0832)  General: lying in bed, NAD Neuro: Asleep, does barely open eyes to repeated tactile stimulation, doesn't follow commands, antigravity strength in all extremity with right hemiparesis  Basic Metabolic Panel: Recent Labs  Lab 08/28/23 2022 08/29/23 0628 08/30/23 1326 08/31/23 0757 09/01/23 0728 09/01/23 1434 09/02/23 0534 09/03/23 0459  NA 132* 134* 137 140 139  --  138 135  K 3.6 3.4* 3.3* 3.2* 3.0*  --  3.2* 3.7  CL 101 102 106 110 109  --  109 106  CO2 19* 21* 21* 22 21*  --  21* 20*  GLUCOSE 110* 121* 112* 92 98  --  97 111*  BUN 14 14 13 13 14   --  13 11  CREATININE 0.85 0.81 0.78 0.81 0.76  --  0.76 0.73  CALCIUM  8.5* 8.4* 8.3* 8.2* 8.2*  --  7.9* 8.1*  MG 1.9 2.0  --   --  2.0  --  1.9 2.0  PHOS 2.9 2.4*  --   --   --  2.7 2.5 2.5    CBC: Recent Labs  Lab 08/30/23 1326 08/31/23 0757 09/01/23 0728 09/02/23 0534 09/03/23 0459  WBC 10.8* 9.0 8.2 7.9 9.1  NEUTROABS 7.4 5.9  --   --   --   HGB 12.2* 11.6* 11.0* 11.1* 11.3*  HCT 36.9* 34.7* 33.2* 33.6* 34.3*  MCV 79.7* 79.8* 79.6* 80.8 81.1  PLT 249 253 250 238 267     Coagulation Studies: No results for input(s): "LABPROT", "INR" in the last 72 hours.  Imaging No new brain imaging overnight   ASSESSMENT AND PLAN:78 year old male with history of dementia, prior stroke with right-sided deficits who presented with speech disturbance and altered mental status since 08/27/2023.   Acute encephalopathy Aphasia - Encephalopathy and aphasia along with MRI and EEG findings concerning for seizures   Recommendations - Continue Keppra  250 mg  twice daily to minimize sedation -Start amantadine 100 mg twice daily for neurostimulation once tube is placed -Discussed with son on phone and daughter-in-law at bedside that typically these medications take a few days to work on 09/02/2023.  Therefore we can wait for about 72 hours after starting medications to see if there is improvement in mental status.  If mental status does not improve, family is considering comfort which I think is reasonable - Continue seizure precautions - prn versed  for clinical seizure lasting more than 2 minutes    I have spent a total of  26 minutes with the patient reviewing hospital notes,  test results, labs and examining the patient as well as establishing an assessment and plan.  > 50% of time was spent in direct patient care.     Roxy Cordial Epilepsy [facility] For questions after 5pm please refer to AMION to reach the Neurologist on call

## 2023-09-03 NOTE — Progress Notes (Signed)
 PROGRESS NOTE  Christopher Murphy ZOX:[id number] DOB: Oct 05, 1945 DOA: 08/28/2023 PCP: Carollynn Cirri, NP   LOS: 6 days   Brief narrative:  78 years old male with past medical history of vascular dementia, prior CVA with right-sided hemiplegia, hyperlipidemia, aortic aneurysm, who was transferred from [facility] for altered mental status.  He has been admitted with acute encephalopathy and aphasia secondary to seizures noted on EEG 5/22.  Patient still remains on adjusted dose of Keppra as per neurology.  Appreciate further neurology evaluation and palliative consulted for goals of care 5/24. Plan was to try and start tube feedings on 5/25, however core track placement was not available.  Continue IV fluids.  Plan is to continue to see if prognosis will improve over the next several days and if he becomes more alert and awake.  Currently remains postictal from seizure noted 5/22.      Assessment/Plan: Principal Problem:   Encephalopathy Active Problems:   Decreased level of consciousness   Vertebral artery dissection (HCC)    Acute Encephalopathy-persistent likely secondary to postictal state Patient with aphasia and unresponsiveness at this time. Neurology following.  EEG shows a focal changes.  Patient was initially loaded with phenytoin and was continued on Keppra.  Lumbar puncture on 523 without any significant findings.  Continue supportive care.  Dose of Keppra was decreased.  VBG without hypercapnia.  Vitamin B12 398.  Plan is to keep more than 500.  Continue IV thiamine high-dose.  Continue n.p.o. today.  Plan for cortrak tube tube placement for tube feeding. Overall mental status unchanged, unresponsive, able to withdraw some  Right vertebral artery dissection Neurology recommends  DAPT x 90 days followed by Plavix  75 mg daily.  Currently limited oral intake.  Continue aspirin  suppository.     Fever   improved.  Emperature max of 99.5.  Fahrenheit.  Chest x-ray without any pneumonia.   UA without UTI.  Has some ethmoid sinusitis on CT head scan.Aaron Aas COVID RSV respiratory virus panel negative.  Patient received Unasyn  for aspiration pneumonia until 09/02/2023.  Lumbar puncture was negative for CNS infection.   Vascular dementia: On memantine , donepezil  at home.  Currently NPO.  History of CVA right-sided hemiplegia: On aspirin  suppository.  Crestor  on hold.  Aaron Aas LDL 52, A1c 5.5%  HLD: Hold statins for now.  Oral secretions.  History of stroke in with decreased mentation leading to retention of secretions.  Continue supportive care including aspiration precautions, suctioning and will add scopolamine patch today.  Deconditioning debility history of CVA. Limited evaluation due to encephalopathy.  Will need continued PT OT intervention while in the hospital.  Aortic aneurysm: Noted DVT prophylaxis: Place and maintain sequential compression device Start: 08/28/23 2004   Disposition: Uncertain at this time  Status is: Inpatient Remains inpatient appropriate because: Encephalopathy, postictal status, need for cortrak tube tube feeding, pending clinical improvement    Code Status:     Code Status: Limited: Do not attempt resuscitation (DNR) -DNR-LIMITED -Do Not Intubate/DNI   Family Communication: Spoke with the patient's daughter-in-law at bedside  Consultants: Neurology Palliative care  Procedures: EEG  Anti-infectives:  Unasyn  completed  Anti-infectives (From admission, onward)    Start     Dose/Rate Route Frequency Ordered Stop   08/28/23 2145  Ampicillin -Sulbactam (UNASYN ) 3 g in sodium chloride  0.9 % 100 mL IVPB        3 g 200 mL/hr over 30 Minutes Intravenous Every 6 hours 08/28/23 2048 09/02/23 2144  Subjective: Today, patient was seen and examined at bedside.  Patient is not responsive.  Appears somnolent.  Patient's daughter-in-law at bedside and concerned about swallowing and gurgling oral sounds.  Objective: Vitals:   09/03/23 0502  09/03/23 0832  BP: (!) 154/79 (!) 155/77  Pulse: 74 73  Resp: 18 17  Temp: 97.7 F (36.5 C) 98.3 F (36.8 C)  SpO2: 98% 98%    Intake/Output Summary (Last 24 hours) at 09/03/2023 1106 Last data filed at 09/03/2023 0900 Gross per 24 hour  Intake 2809.01 ml  Output 1900 ml  Net 909.01 ml   Filed Weights   09/02/23 0705  Weight: 67.8 kg   Body mass index is 21.45 kg/m.   Physical Exam: GENERAL: Patient is somnolent, noninteractive, does not respond, noisy breathing, elderly male, HENT: No scleral pallor or icterus. Pupils equally reactive to light. Oral mucosa is moist NECK: is supple, no gross swelling noted. CHEST: Clear to auscultation. No crackles or wheezes.  Diminished breath sounds bilaterally. CVS: S1 and S2 heard, no murmur. Regular rate and rhythm.  ABDOMEN: Soft, non-tender, bowel sounds are present.  External urinary catheter noted. EXTREMITIES: No edema. CNS: Right-sided stiffness noted.  Left-sided flaccid weakness noted. SKIN: warm and dry without rashes.  Data Review: I have personally reviewed the following laboratory data and studies,  CBC: Recent Labs  Lab 08/30/23 1326 08/31/23 0757 09/01/23 0728 09/02/23 0534 09/03/23 0459  WBC 10.8* 9.0 8.2 7.9 9.1  NEUTROABS 7.4 5.9  --   --   --   HGB 12.2* 11.6* 11.0* 11.1* 11.3*  HCT 36.9* 34.7* 33.2* 33.6* 34.3*  MCV 79.7* 79.8* 79.6* 80.8 81.1  PLT 249 253 250 238 267   Basic Metabolic Panel: Recent Labs  Lab 08/28/23 2022 08/29/23 0628 08/30/23 1326 08/31/23 0757 09/01/23 0728 09/01/23 1434 09/02/23 0534 09/03/23 0459  NA 132* 134* 137 140 139  --  138 135  K 3.6 3.4* 3.3* 3.2* 3.0*  --  3.2* 3.7  CL 101 102 106 110 109  --  109 106  CO2 19* 21* 21* 22 21*  --  21* 20*  GLUCOSE 110* 121* 112* 92 98  --  97 111*  BUN 14 14 13 13 14   --  13 11  CREATININE 0.85 0.81 0.78 0.81 0.76  --  0.76 0.73  CALCIUM  8.5* 8.4* 8.3* 8.2* 8.2*  --  7.9* 8.1*  MG 1.9 2.0  --   --  2.0  --  1.9 2.0  PHOS 2.9  2.4*  --   --   --  2.7 2.5 2.5   Liver Function Tests: Recent Labs  Lab 08/28/23 2022 08/30/23 1326 08/31/23 0757  AST 17 19 15   ALT 11 9 10   ALKPHOS 49 46 46  BILITOT 0.6 0.7 0.8  PROT 6.9 6.4* 6.0*  ALBUMIN 2.9* 2.6* 2.4*   No results for input(s): "LIPASE", "AMYLASE" in the last 168 hours. Recent Labs  Lab 08/27/23 2139  AMMONIA <13   Cardiac Enzymes: No results for input(s): "CKTOTAL", "CKMB", "CKMBINDEX", "TROPONINI" in the last 168 hours. BNP (last 3 results) No results for input(s): "BNP" in the last 8760 hours.  ProBNP (last 3 results) No results for input(s): "PROBNP" in the last 8760 hours.  CBG: Recent Labs  Lab 09/02/23 1221 09/02/23 1616 09/02/23 2107 09/03/23 0000 09/03/23 0458  GLUCAP 100* 99 111* 121* 111*   Recent Results (from the past 240 hours)  Respiratory (~20 pathogens) panel by PCR  Status: None   Collection Time: 08/28/23  8:41 PM   Specimen: Nasopharyngeal Swab; Respiratory  Result Value Ref Range Status   Adenovirus NOT DETECTED NOT DETECTED Final   Coronavirus 229E NOT DETECTED NOT DETECTED Final    Comment: (NOTE) The Coronavirus on the Respiratory Panel, DOES NOT test for the novel  Coronavirus (2019 nCoV)    Coronavirus HKU1 NOT DETECTED NOT DETECTED Final   Coronavirus NL63 NOT DETECTED NOT DETECTED Final   Coronavirus OC43 NOT DETECTED NOT DETECTED Final   Metapneumovirus NOT DETECTED NOT DETECTED Final   Rhinovirus / Enterovirus NOT DETECTED NOT DETECTED Final   Influenza A NOT DETECTED NOT DETECTED Final   Influenza B NOT DETECTED NOT DETECTED Final   Parainfluenza Virus 1 NOT DETECTED NOT DETECTED Final   Parainfluenza Virus 2 NOT DETECTED NOT DETECTED Final   Parainfluenza Virus 3 NOT DETECTED NOT DETECTED Final   Parainfluenza Virus 4 NOT DETECTED NOT DETECTED Final   Respiratory Syncytial Virus NOT DETECTED NOT DETECTED Final   Bordetella pertussis NOT DETECTED NOT DETECTED Final   Bordetella Parapertussis NOT  DETECTED NOT DETECTED Final   Chlamydophila pneumoniae NOT DETECTED NOT DETECTED Final   Mycoplasma pneumoniae NOT DETECTED NOT DETECTED Final    Comment: Performed at [facility], [address], [place], [place] [address]  SARS Coronavirus 2 by RT PCR (hospital order, performed in [facility] hospital lab) *cepheid single result test* Anterior Nasal Swab     Status: None   Collection Time: 08/28/23  8:41 PM   Specimen: Anterior Nasal Swab  Result Value Ref Range Status   SARS Coronavirus 2 by RT PCR NEGATIVE NEGATIVE Final    Comment: Performed at [facility], [address] [place], [place] [address]     Studies: No results found.    Hilary Milks, MD  [facility] 09/03/2023  If 7PM-7AM, please contact night-coverage

## 2023-09-04 ENCOUNTER — Inpatient Hospital Stay (HOSPITAL_COMMUNITY)

## 2023-09-04 DIAGNOSIS — R4701 Aphasia: Secondary | ICD-10-CM | POA: Diagnosis not present

## 2023-09-04 DIAGNOSIS — R569 Unspecified convulsions: Secondary | ICD-10-CM | POA: Diagnosis not present

## 2023-09-04 DIAGNOSIS — G9340 Encephalopathy, unspecified: Secondary | ICD-10-CM | POA: Diagnosis not present

## 2023-09-04 LAB — GLUCOSE, CAPILLARY
Glucose-Capillary: 114 mg/dL — ABNORMAL HIGH (ref 70–99)
Glucose-Capillary: 119 mg/dL — ABNORMAL HIGH (ref 70–99)
Glucose-Capillary: 121 mg/dL — ABNORMAL HIGH (ref 70–99)
Glucose-Capillary: 121 mg/dL — ABNORMAL HIGH (ref 70–99)
Glucose-Capillary: 122 mg/dL — ABNORMAL HIGH (ref 70–99)

## 2023-09-04 LAB — COMPREHENSIVE METABOLIC PANEL WITH GFR
ALT: 26 U/L (ref 0–44)
AST: 28 U/L (ref 15–41)
Albumin: 2.4 g/dL — ABNORMAL LOW (ref 3.5–5.0)
Alkaline Phosphatase: 71 U/L (ref 38–126)
Anion gap: 8 (ref 5–15)
BUN: 12 mg/dL (ref 8–23)
CO2: 21 mmol/L — ABNORMAL LOW (ref 22–32)
Calcium: 8 mg/dL — ABNORMAL LOW (ref 8.9–10.3)
Chloride: 106 mmol/L (ref 98–111)
Creatinine, Ser: 0.66 mg/dL (ref 0.61–1.24)
GFR, Estimated: >60 mL/min (ref >60)
Glucose, Bld: 110 mg/dL — ABNORMAL HIGH (ref 70–99)
Potassium: 3.2 mmol/L — ABNORMAL LOW (ref 3.5–5.1)
Sodium: 135 mmol/L (ref 135–145)
Total Bilirubin: 0.7 mg/dL (ref 0.0–1.2)
Total Protein: 6.1 g/dL — ABNORMAL LOW (ref 6.5–8.1)

## 2023-09-04 LAB — CBC
HCT: 34.4 % — ABNORMAL LOW (ref 39.0–52.0)
Hemoglobin: 11.7 g/dL — ABNORMAL LOW (ref 13.0–17.0)
MCH: 27.1 pg (ref 26.0–34.0)
MCHC: 34 g/dL (ref 30.0–36.0)
MCV: 79.6 fL — ABNORMAL LOW (ref 80.0–100.0)
Platelets: 285 10*3/uL (ref 150–400)
RBC: 4.32 MIL/uL (ref 4.22–5.81)
RDW: 13.8 % (ref 11.5–15.5)
WBC: 10.2 10*3/uL (ref 4.0–10.5)
nRBC: 0 % (ref 0.0–0.2)

## 2023-09-04 LAB — PHOSPHORUS: Phosphorus: 2.1 mg/dL — ABNORMAL LOW (ref 2.5–4.6)

## 2023-09-04 LAB — MAGNESIUM: Magnesium: 2 mg/dL (ref 1.7–2.4)

## 2023-09-04 MED ORDER — GLYCOPYRROLATE 0.2 MG/ML IJ SOLN
0.1000 mg | Freq: Three times a day (TID) | INTRAMUSCULAR | Status: DC
  Filled 2023-09-04: qty 1

## 2023-09-04 MED ORDER — GLYCOPYRROLATE 0.2 MG/ML IJ SOLN
0.1000 mg | Freq: Three times a day (TID) | INTRAMUSCULAR | Status: DC
  Administered 2023-09-04 – 2023-09-18 (×41): 0.1 mg via INTRAVENOUS
  Filled 2023-09-04 (×40): qty 1

## 2023-09-04 MED ORDER — FLUCONAZOLE 100 MG PO TABS: 100.0000 mg | ORAL_TABLET | Freq: Every day | ORAL | Status: DC

## 2023-09-04 MED ORDER — POTASSIUM PHOSPHATES 15 MMOLE/5ML IV SOLN
20.0000 mmol | Freq: Once | INTRAVENOUS | Status: AC
  Administered 2023-09-04: 20 mmol via INTRAVENOUS
  Filled 2023-09-04: qty 6.67

## 2023-09-04 MED ORDER — GLYCOPYRROLATE 0.2 MG/ML IJ SOLN: 0.1000 mg | Freq: Three times a day (TID) | INTRAMUSCULAR | Status: DC | PRN

## 2023-09-04 MED ORDER — FLUCONAZOLE IN SODIUM CHLORIDE 200-0.9 MG/100ML-% IV SOLN
200.0000 mg | Freq: Every day | INTRAVENOUS | Status: DC
  Filled 2023-09-04: qty 100

## 2023-09-04 NOTE — Progress Notes (Addendum)
 Excessive oral secretion Patient's RN informed that patient has excessive oral secretion not improving with scopolamine  also gargling.  However O2 sat is 95 to 97% room air. -Ordering Robinul  0.1 mg 3TID for excessive oral secretion continue deep oral suction with respiratory care.

## 2023-09-04 NOTE — Progress Notes (Addendum)
 Oral thrush Unable to treat with oral nystatin given in the setting of altered mental status.  Also cannot give fluconazole oral or IV form given it will interact with Keppra and fluconazole also cause high risk of seizure.  Aadith Raudenbush, MD [facility] 09/04/2023, 2:11 AM

## 2023-09-04 NOTE — Progress Notes (Addendum)
 PROGRESS NOTE  Christopher Murphy WUJ:[id number] DOB: 17-Apr-1945 DOA: 08/28/2023 PCP: Carollynn Cirri, NP   LOS: 7 days   Brief narrative:  78 years old male with past medical history of vascular dementia, prior CVA with right-sided hemiplegia, hyperlipidemia, aortic aneurysm, who was transferred from [facility] for altered mental status.  He has been admitted with acute encephalopathy and aphasia secondary to seizures noted on EEG 5/22.  Patient still remains on adjusted dose of Keppra  as per neurology.  Appreciate further neurology evaluation and palliative consulted for goals of care 5/24. Plan was to try and start tube feedings on 5/25, however core track placement was not available.  Continue IV fluids.  Plan is to continue to see if prognosis will improve over the next several days and if he becomes more alert and awake.  Currently remains postictal from seizure noted 5/22.  Neurology following the patient.      Assessment/Plan: Principal Problem:   Encephalopathy Active Problems:   Decreased level of consciousness   Vertebral artery dissection (HCC)    Acute Encephalopathy-persistent likely secondary to postictal state Patient with aphasia and unresponsiveness at this time. Neurology following.  Communicated with neurology again today. Patient was initially loaded with phenytoin  and was continued on Keppra .  Lumbar puncture on 5/23 without any significant findings.  Continue supportive care.  Dose of Keppra  was decreased.  Communicated with neurology today and recommended discontinuation of Keppra  since patient mentation has not improved.  VBG without hypercapnia.  Vitamin B12 398.  Plan is to keep more than 500.  Received IV thiamine  high-dose.  Continue n.p.o. cortrak tube tube placement for tube feeding.  Amantadine  has been added to the regimen.  Overall mental status unchanged, unresponsive.  Continue aspiration precautions.  Right vertebral artery dissection Neurology recommends   DAPT x 90 days followed by Plavix  75 mg daily.   Continue aspirin  suppository.    Hypokalemia Hypophosphatemia.  Will replace with K-Phos 20 mmol today.  Check levels in AM.  Nutrition Status: Severe malnutrition present on admission.  Body mass index is 21.35 kg/m. Continue tube feeding Nutrition Problem: Severe Malnutrition Etiology: chronic illness (vascular dementia and hx CVA w/ R sided hemiplegia) Signs/Symptoms: severe muscle depletion, severe fat depletion Interventions: Prostat, Tube feeding, MVI, Refer to RD note for recommendations     Fever   improved.  Temperature max of 98.6 F Fahrenheit.  Chest x-ray without any pneumonia.  UA without UTI.  Has some ethmoid sinusitis on CT head scan. COVID RSV respiratory virus panel negative.  Patient received Unasyn  for aspiration pneumonia until 09/02/2023.  Lumbar puncture was negative for CNS infection.   Vascular dementia: On memantine , donepezil  at home.  Currently NPO.  History of CVA right-sided hemiplegia: On aspirin  suppository.  Crestor  on hold.  Aaron Aas LDL 52, A1c 5.5%  HLD: Hold statins for now.  Non-anion gap acute metabolic acidosis  Will continue to monitor.  Bicarbonate of 21.  Will continue to monitor.  Anion gap not elevated.  Oral secretions.  History of stroke in with decreased mentation leading to retention of secretions.  Continue supportive care including aspiration precautions, suctioning.  Has been started on scopolamine  and Robinul  for excessive secretions.  With concerns for congestion and gurgling, we will get the x-ray of the chest.  Deconditioning debility history of CVA. Limited evaluation due to encephalopathy.  Will need continued PT OT intervention while in the hospital.  Aortic aneurysm: Noted DVT prophylaxis: Place and maintain sequential compression device Start: 08/28/23 2004  Disposition: Uncertain at this time  Status is: Inpatient Remains inpatient appropriate because: Deep encephalopathy,  postictal status, need for cortrak tube tube feeding, pending clinical improvement    Code Status:     Code Status: Limited: Do not attempt resuscitation (DNR) -DNR-LIMITED -Do Not Intubate/DNI   Family Communication:  Spoke with the patient's daughter-in-law at bedside again today at length.  Discussed about potential outcomes including prolonged encephalopathy and decline in medical condition.  Palliative care on board.  Consultants: Neurology Palliative care  Procedures: EEG  Anti-infectives:  Unasyn - completed  Anti-infectives (From admission, onward)    Start     Dose/Rate Route Frequency Ordered Stop   09/04/23 1000  fluconazole (DIFLUCAN) tablet 100 mg  Status:  Discontinued        100 mg Per Tube Daily 09/04/23 0208 09/04/23 0209   09/04/23 0300  fluconazole (DIFLUCAN) IVPB 200 mg  Status:  Discontinued        200 mg 100 mL/hr over 60 Minutes Intravenous Daily 09/04/23 0209 09/04/23 0224   08/28/23 2145  Ampicillin -Sulbactam (UNASYN ) 3 g in sodium chloride  0.9 % 100 mL IVPB        3 g 200 mL/hr over 30 Minutes Intravenous Every 6 hours 08/28/23 2048 09/02/23 2144       Subjective: Today, patient was seen and examined at bedside.  Patient is not responsive.  Patient's daughter-in-law at bedside and tube feedings were on hold overnight due to gurgling sounds.  Objective: Vitals:   09/03/23 2303 09/04/23 0827  BP: (!) 141/90 (!) 148/77  Pulse: 77 90  Resp:    Temp: 98.6 F (37 C) 98.5 F (36.9 C)  SpO2: 95% 94%    Intake/Output Summary (Last 24 hours) at 09/04/2023 1119 Last data filed at 09/04/2023 0617 Gross per 24 hour  Intake 1946.7 ml  Output 550 ml  Net 1396.7 ml   Filed Weights   09/02/23 0705 09/04/23 0500  Weight: 67.8 kg 67.5 kg   Body mass index is 21.35 kg/m.   Physical Exam: GENERAL: Patient is unresponsive even on sternal rub.  Mild upper airway noise. HENT: No scleral pallor or icterus. Pupils equally reactive to light. Oral mucosa  is moist NECK: is supple, no gross swelling noted. CHEST: .  Diminished breath sounds bilaterally.  Coarse breath sounds noted. CVS: S1 and S2 heard, no murmur. Regular rate and rhythm.  ABDOMEN: Soft, non-tender, bowel sounds are present.  External urinary catheter noted. EXTREMITIES: No edema. CNS: Right-sided stiffness noted.  Left-sided flaccid weakness noted.  Patient unresponsive, some spontaneous movements noted. SKIN: warm and dry without rashes.  Data Review: I have personally reviewed the following laboratory data and studies,  CBC: Recent Labs  Lab 08/30/23 1326 08/31/23 0757 09/01/23 0728 09/02/23 0534 09/03/23 0459 09/04/23 0613  WBC 10.8* 9.0 8.2 7.9 9.1 10.2  NEUTROABS 7.4 5.9  --   --   --   --   HGB 12.2* 11.6* 11.0* 11.1* 11.3* 11.7*  HCT 36.9* 34.7* 33.2* 33.6* 34.3* 34.4*  MCV 79.7* 79.8* 79.6* 80.8 81.1 79.6*  PLT 249 253 250 238 267 285   Basic Metabolic Panel: Recent Labs  Lab 08/29/23 0628 08/30/23 1326 08/31/23 0757 09/01/23 0728 09/01/23 1434 09/02/23 0534 09/03/23 0459 09/04/23 0613  NA 134*   < > 140 139  --  138 135 135  K 3.4*   < > 3.2* 3.0*  --  3.2* 3.7 3.2*  CL 102   < > 110 109  --  109 106 106  CO2 21*   < > 22 21*  --  21* 20* 21*  GLUCOSE 121*   < > 92 98  --  97 111* 110*  BUN 14   < > 13 14  --  13 11 12   CREATININE 0.81   < > 0.81 0.76  --  0.76 0.73 0.66  CALCIUM  8.4*   < > 8.2* 8.2*  --  7.9* 8.1* 8.0*  MG 2.0  --   --  2.0  --  1.9 2.0 2.0  PHOS 2.4*  --   --   --  2.7 2.5 2.5 2.1*   < > = values in this interval not displayed.   Liver Function Tests: Recent Labs  Lab 08/28/23 2022 08/30/23 1326 08/31/23 0757 09/04/23 0613  AST 17 19 15 28   ALT 11 9 10 26   ALKPHOS 49 46 46 71  BILITOT 0.6 0.7 0.8 0.7  PROT 6.9 6.4* 6.0* 6.1*  ALBUMIN 2.9* 2.6* 2.4* 2.4*   No results for input(s): "LIPASE", "AMYLASE" in the last 168 hours. No results for input(s): "AMMONIA" in the last 168 hours.  Cardiac Enzymes: No  results for input(s): "CKTOTAL", "CKMB", "CKMBINDEX", "TROPONINI" in the last 168 hours. BNP (last 3 results) No results for input(s): "BNP" in the last 8760 hours.  ProBNP (last 3 results) No results for input(s): "PROBNP" in the last 8760 hours.  CBG: Recent Labs  Lab 09/03/23 0458 09/03/23 1215 09/03/23 1656 09/04/23 0048 09/04/23 0921  GLUCAP 111* 111* 105* 121* 121*   Recent Results (from the past 240 hours)  Respiratory (~20 pathogens) panel by PCR     Status: None   Collection Time: 08/28/23  8:41 PM   Specimen: Nasopharyngeal Swab; Respiratory  Result Value Ref Range Status   Adenovirus NOT DETECTED NOT DETECTED Final   Coronavirus 229E NOT DETECTED NOT DETECTED Final    Comment: (NOTE) The Coronavirus on the Respiratory Panel, DOES NOT test for the novel  Coronavirus (2019 nCoV)    Coronavirus HKU1 NOT DETECTED NOT DETECTED Final   Coronavirus NL63 NOT DETECTED NOT DETECTED Final   Coronavirus OC43 NOT DETECTED NOT DETECTED Final   Metapneumovirus NOT DETECTED NOT DETECTED Final   Rhinovirus / Enterovirus NOT DETECTED NOT DETECTED Final   Influenza A NOT DETECTED NOT DETECTED Final   Influenza B NOT DETECTED NOT DETECTED Final   Parainfluenza Virus 1 NOT DETECTED NOT DETECTED Final   Parainfluenza Virus 2 NOT DETECTED NOT DETECTED Final   Parainfluenza Virus 3 NOT DETECTED NOT DETECTED Final   Parainfluenza Virus 4 NOT DETECTED NOT DETECTED Final   Respiratory Syncytial Virus NOT DETECTED NOT DETECTED Final   Bordetella pertussis NOT DETECTED NOT DETECTED Final   Bordetella Parapertussis NOT DETECTED NOT DETECTED Final   Chlamydophila pneumoniae NOT DETECTED NOT DETECTED Final   Mycoplasma pneumoniae NOT DETECTED NOT DETECTED Final    Comment: Performed at [facility], [address] [place], [place] [address]  SARS Coronavirus 2 by RT PCR (hospital order, performed in [facility] hospital lab) *cepheid single result test* Anterior Nasal Swab     Status:  None   Collection Time: 08/28/23  8:41 PM   Specimen: Anterior Nasal Swab  Result Value Ref Range Status   SARS Coronavirus 2 by RT PCR NEGATIVE NEGATIVE Final    Comment: Performed at [facility], [address] [place], [place] [address]     Studies: No results found.  Cardelia Sassano, MD  [facility] 09/04/2023  If 7PM-7AM, please contact night-coverage

## 2023-09-04 NOTE — Care Management Important Message (Signed)
 Important Message  Patient Details  Name: Christopher Murphy MRN: [id number] Date of Birth: 02-Nov-1945   Important Message Given:  Yes - Medicare IM     Wynonia Hedges 09/04/2023, 4:55 PM

## 2023-09-04 NOTE — Plan of Care (Signed)
  Problem: Education: Goal: Knowledge of General Education information will improve Description: Including pain rating scale, medication(s)/side effects and non-pharmacologic comfort measures Outcome: Not Progressing   Problem: Health Behavior/Discharge Planning: Goal: Ability to manage health-related needs will improve Outcome: Not Progressing   Problem: Clinical Measurements: Goal: Ability to maintain clinical measurements within normal limits will improve Outcome: Not Progressing Goal: Will remain free from infection Outcome: Not Progressing Goal: Diagnostic test results will improve Outcome: Not Progressing Goal: Respiratory complications will improve Outcome: Not Progressing Goal: Cardiovascular complication will be avoided Outcome: Not Progressing   Problem: Activity: Goal: Risk for activity intolerance will decrease Outcome: Not Progressing   Problem: Nutrition: Goal: Adequate nutrition will be maintained Outcome: Not Progressing   Problem: Coping: Goal: Level of anxiety will decrease Outcome: Not Progressing   Problem: Elimination: Goal: Will not experience complications related to bowel motility Outcome: Not Progressing Goal: Will not experience complications related to urinary retention Outcome: Not Progressing   Problem: Pain Managment: Goal: General experience of comfort will improve and/or be controlled Outcome: Not Progressing   Problem: Safety: Goal: Ability to remain free from injury will improve Outcome: Not Progressing   Problem: Skin Integrity: Goal: Risk for impaired skin integrity will decrease Outcome: Not Progressing   Problem: Education: Goal: Knowledge of General Education information will improve Description: Including pain rating scale, medication(s)/side effects and non-pharmacologic comfort measures Outcome: Not Progressing   Problem: Health Behavior/Discharge Planning: Goal: Ability to manage health-related needs will  improve Outcome: Not Progressing   Problem: Clinical Measurements: Goal: Ability to maintain clinical measurements within normal limits will improve Outcome: Not Progressing Goal: Will remain free from infection Outcome: Not Progressing Goal: Diagnostic test results will improve Outcome: Not Progressing Goal: Respiratory complications will improve Outcome: Not Progressing Goal: Cardiovascular complication will be avoided Outcome: Not Progressing   Problem: Activity: Goal: Risk for activity intolerance will decrease Outcome: Not Progressing   Problem: Nutrition: Goal: Adequate nutrition will be maintained Outcome: Not Progressing   Problem: Coping: Goal: Level of anxiety will decrease Outcome: Not Progressing   Problem: Elimination: Goal: Will not experience complications related to bowel motility Outcome: Not Progressing Goal: Will not experience complications related to urinary retention Outcome: Not Progressing   Problem: Pain Managment: Goal: General experience of comfort will improve and/or be controlled Outcome: Not Progressing   Problem: Safety: Goal: Ability to remain free from injury will improve Outcome: Not Progressing   Problem: Skin Integrity: Goal: Risk for impaired skin integrity will decrease Outcome: Not Progressing

## 2023-09-04 NOTE — Plan of Care (Signed)
   Problem: Nutrition: Goal: Adequate nutrition will be maintained Outcome: Progressing

## 2023-09-04 NOTE — Plan of Care (Signed)
 Problem: Education: Goal: Knowledge of General Education information will improve Description: Including pain rating scale, medication(s)/side effects and non-pharmacologic comfort measures Outcome: Not Progressing   Problem: Health Behavior/Discharge Planning: Goal: Ability to manage health-related needs will improve Outcome: Not Progressing   Problem: Clinical Measurements: Goal: Ability to maintain clinical measurements within normal limits will improve Outcome: Not Progressing Goal: Will remain free from infection Outcome: Not Progressing Goal: Diagnostic test results will improve Outcome: Not Progressing Goal: Respiratory complications will improve Outcome: Not Progressing Goal: Cardiovascular complication will be avoided Outcome: Not Progressing   Problem: Activity: Goal: Risk for activity intolerance will decrease Outcome: Not Progressing   Problem: Nutrition: Goal: Adequate nutrition will be maintained Outcome: Not Progressing   Problem: Coping: Goal: Level of anxiety will decrease Outcome: Not Progressing   Problem: Elimination: Goal: Will not experience complications related to bowel motility Outcome: Not Progressing Goal: Will not experience complications related to urinary retention Outcome: Not Progressing   Problem: Pain Managment: Goal: General experience of comfort will improve and/or be controlled Outcome: Not Progressing   Problem: Safety: Goal: Ability to remain free from injury will improve Outcome: Not Progressing   Problem: Skin Integrity: Goal: Risk for impaired skin integrity will decrease Outcome: Not Progressing   Problem: Education: Goal: Knowledge of General Education information will improve Description: Including pain rating scale, medication(s)/side effects and non-pharmacologic comfort measures Outcome: Not Progressing   Problem: Health Behavior/Discharge Planning: Goal: Ability to manage health-related needs will  improve Outcome: Not Progressing   Problem: Clinical Measurements: Goal: Ability to maintain clinical measurements within normal limits will improve Outcome: Not Progressing Goal: Will remain free from infection Outcome: Not Progressing Goal: Diagnostic test results will improve Outcome: Not Progressing Goal: Respiratory complications will improve Outcome: Not Progressing Goal: Cardiovascular complication will be avoided Outcome: Not Progressing   Problem: Activity: Goal: Risk for activity intolerance will decrease Outcome: Not Progressing   Problem: Nutrition: Goal: Adequate nutrition will be maintained Outcome: Not Progressing   Problem: Coping: Goal: Level of anxiety will decrease Outcome: Not Progressing   Problem: Elimination: Goal: Will not experience complications related to bowel motility Outcome: Not Progressing Goal: Will not experience complications related to urinary retention Outcome: Not Progressing   Problem: Pain Managment: Goal: General experience of comfort will improve and/or be controlled Outcome: Not Progressing   Problem: Safety: Goal: Ability to remain free from injury will improve Outcome: Not Progressing   Problem: Skin Integrity: Goal: Risk for impaired skin integrity will decrease Outcome: Not Progressing   Problem: Education: Goal: Knowledge of disease or condition will improve Outcome: Not Progressing Goal: Knowledge of secondary prevention will improve (MUST DOCUMENT ALL) Outcome: Not Progressing Goal: Knowledge of patient specific risk factors will improve (DELETE if not current risk factor) Outcome: Not Progressing   Problem: Intracerebral Hemorrhage Tissue Perfusion: Goal: Complications of Intracerebral Hemorrhage will be minimized Outcome: Not Progressing   Problem: Coping: Goal: Will verbalize positive feelings about self Outcome: Not Progressing Goal: Will identify appropriate support needs Outcome: Not Progressing    Problem: Health Behavior/Discharge Planning: Goal: Ability to manage health-related needs will improve Outcome: Not Progressing Goal: Goals will be collaboratively established with patient/family Outcome: Not Progressing   Problem: Self-Care: Goal: Ability to participate in self-care as condition permits will improve Outcome: Not Progressing Goal: Verbalization of feelings and concerns over difficulty with self-care will improve Outcome: Not Progressing Goal: Ability to communicate needs accurately will improve Outcome: Not Progressing   Problem: Nutrition: Goal: Risk of aspiration will decrease Outcome:  Not Progressing Goal: Dietary intake will improve Outcome: Not Progressing Patient non responsive unable to make needs known total care on tube feeding transfer to higher level of care dur to [facility] [place]

## 2023-09-04 NOTE — Progress Notes (Addendum)
 Subjective: Has had some trouble managing secretions overnight.  Continues to be comatose.  Daughter-in-law at bedside.  ROS: Unable to obtain due to poor mental status  Examination  Vital signs in last 24 hours: Temp:  [98.3 F (36.8 C)-98.6 F (37 C)] 98.5 F (36.9 C) (05/28 0827) Pulse Rate:  [77-90] 90 (05/28 0827) Resp:  [18] 18 (05/27 1316) BP: (141-161)/(77-90) 148/77 (05/28 0827) SpO2:  [94 %-97 %] 94 % (05/28 0827) Weight:  [67.5 kg] 67.5 kg (05/28 0500)  General: lying in bed, NAD Neuro: Asleep, does barely open eyes to repeated tactile stimulation, doesn't follow commands, antigravity strength in all extremity with right hemiparesis    Basic Metabolic Panel: Recent Labs  Lab 08/29/23 0628 08/30/23 1326 08/31/23 0757 09/01/23 0728 09/01/23 1434 09/02/23 0534 09/03/23 0459 09/04/23 0613  NA 134*   < > 140 139  --  138 135 135  K 3.4*   < > 3.2* 3.0*  --  3.2* 3.7 3.2*  CL 102   < > 110 109  --  109 106 106  CO2 21*   < > 22 21*  --  21* 20* 21*  GLUCOSE 121*   < > 92 98  --  97 111* 110*  BUN 14   < > 13 14  --  13 11 12   CREATININE 0.81   < > 0.81 0.76  --  0.76 0.73 0.66  CALCIUM  8.4*   < > 8.2* 8.2*  --  7.9* 8.1* 8.0*  MG 2.0  --   --  2.0  --  1.9 2.0 2.0  PHOS 2.4*  --   --   --  2.7 2.5 2.5 2.1*   < > = values in this interval not displayed.    CBC: Recent Labs  Lab 08/30/23 1326 08/31/23 0757 09/01/23 0728 09/02/23 0534 09/03/23 0459 09/04/23 0613  WBC 10.8* 9.0 8.2 7.9 9.1 10.2  NEUTROABS 7.4 5.9  --   --   --   --   HGB 12.2* 11.6* 11.0* 11.1* 11.3* 11.7*  HCT 36.9* 34.7* 33.2* 33.6* 34.3* 34.4*  MCV 79.7* 79.8* 79.6* 80.8 81.1 79.6*  PLT 249 253 250 238 267 285     Coagulation Studies: No results for input(s): "LABPROT", "INR" in the last 72 hours.  Imaging No new brain imaging  ASSESSMENT AND PLAN:78-year-old male with history of dementia, prior stroke with right-sided deficits who presented with speech disturbance and altered  mental status since 08/27/2023.   Acute encephalopathy Aphasia - Encephalopathy and aphasia along with MRI and EEG findings concerning for seizures   Recommendations - DC Keppra to minimize sedation - Continue amantadine 100 mg twice daily for neurostimulation - It is likely that patient has poor neuro cognitive reserve and cognitive impairment which in the setting of repeated hospitalizations has led to gradual deterioration and progressive encephalopathy.   -I considered starting him on high-dose steroids for potential autoimmune etiology.  However his MRI changes were present since 2017 (oldest MRI in our system).  It improved on repeat MRI in 2019 but were noted again in the MRI performed in 2023. Therefore less likely to be autoimmune or paraneoplastic in nature.  Additionally patient has had gradual cognitive decline and therefore would not want to pursue aggressive treatment -If mental status does not improve, family is considering comfort care/hospice.  Appreciate palliative care team's help - Continue seizure precautions - prn versed  for clinical seizure lasting more than 2 minutes -Discussed plan with daughter-in-law  at bedside and Dr. Efrain Grant via secure chat     I have spent a total of  36 minutes with the patient reviewing hospital notes,  test results, labs and examining the patient as well as establishing an assessment and plan.  > 50% of time was spent in direct patient care.        Roxy Cordial Epilepsy [facility] For questions after 5pm please refer to AMION to reach the Neurologist on call

## 2023-09-04 NOTE — Plan of Care (Addendum)
    Progress Note from the Palliative Medicine Team at [facility]   Patient Name: Christopher Murphy        Date: 09/04/2023 DOB: [birth year]  Age: 78 y.o. MRN#: [id number] Attending Physician: Rosena Conradi, MD Primary Care Physician: Carollynn Cirri, NP Admit Date: 08/28/2023   Reason for Consultation/Follow-up   Establishing Goals of Care   Patient is minimally responsive to gentle touch and verbal stimuli. Now with core-trac, allowing time for outcomes.    Family is hopeful for signs of improvement.   Subjective  Attempted visit with family at bedside, son unable to talk at this time.  Will f/u at a later time.   Chart review has been completed prior to meeting with patient/family  including labs, vital signs, imaging, progress/consult notes, orders, medications and available advance directive documents.    No charge  [name] NP  Palliative Medicine Team Team Phone # [phone] Pager [phone]

## 2023-09-05 ENCOUNTER — Other Ambulatory Visit: Payer: Self-pay

## 2023-09-05 ENCOUNTER — Encounter (HOSPITAL_COMMUNITY): Payer: Self-pay | Admitting: Internal Medicine

## 2023-09-05 DIAGNOSIS — E43 Unspecified severe protein-calorie malnutrition: Secondary | ICD-10-CM | POA: Insufficient documentation

## 2023-09-05 DIAGNOSIS — G9340 Encephalopathy, unspecified: Secondary | ICD-10-CM | POA: Diagnosis not present

## 2023-09-05 DIAGNOSIS — R569 Unspecified convulsions: Secondary | ICD-10-CM | POA: Diagnosis not present

## 2023-09-05 DIAGNOSIS — Z7189 Other specified counseling: Secondary | ICD-10-CM

## 2023-09-05 DIAGNOSIS — R4701 Aphasia: Secondary | ICD-10-CM | POA: Diagnosis not present

## 2023-09-05 DIAGNOSIS — Z66 Do not resuscitate: Secondary | ICD-10-CM

## 2023-09-05 LAB — BASIC METABOLIC PANEL WITH GFR
Anion gap: 6 (ref 5–15)
BUN: 14 mg/dL (ref 8–23)
CO2: 23 mmol/L (ref 22–32)
Calcium: 7.8 mg/dL — ABNORMAL LOW (ref 8.9–10.3)
Chloride: 105 mmol/L (ref 98–111)
Creatinine, Ser: 0.75 mg/dL (ref 0.61–1.24)
GFR, Estimated: >60 mL/min (ref >60)
Glucose, Bld: 115 mg/dL — ABNORMAL HIGH (ref 70–99)
Potassium: 3.2 mmol/L — ABNORMAL LOW (ref 3.5–5.1)
Sodium: 134 mmol/L — ABNORMAL LOW (ref 135–145)

## 2023-09-05 LAB — CBC
HCT: 34.4 % — ABNORMAL LOW (ref 39.0–52.0)
Hemoglobin: 11.6 g/dL — ABNORMAL LOW (ref 13.0–17.0)
MCH: 26.7 pg (ref 26.0–34.0)
MCHC: 33.7 g/dL (ref 30.0–36.0)
MCV: 79.3 fL — ABNORMAL LOW (ref 80.0–100.0)
Platelets: 280 10*3/uL (ref 150–400)
RBC: 4.34 MIL/uL (ref 4.22–5.81)
RDW: 14.2 % (ref 11.5–15.5)
WBC: 7.6 10*3/uL (ref 4.0–10.5)
nRBC: 0 % (ref 0.0–0.2)

## 2023-09-05 LAB — MAGNESIUM: Magnesium: 2.2 mg/dL (ref 1.7–2.4)

## 2023-09-05 LAB — GLUCOSE, CAPILLARY
Glucose-Capillary: 128 mg/dL — ABNORMAL HIGH (ref 70–99)
Glucose-Capillary: 107 mg/dL — ABNORMAL HIGH (ref 70–99)
Glucose-Capillary: 124 mg/dL — ABNORMAL HIGH (ref 70–99)

## 2023-09-05 LAB — PHOSPHORUS: Phosphorus: 2.7 mg/dL (ref 2.5–4.6)

## 2023-09-05 MED ORDER — POTASSIUM CHLORIDE 20 MEQ PO PACK
40.0000 meq | PACK | ORAL | Status: AC
  Administered 2023-09-05 (×2): 40 meq via ORAL
  Filled 2023-09-05 (×2): qty 2

## 2023-09-05 MED ORDER — CLOPIDOGREL BISULFATE 75 MG PO TABS
75.0000 mg | ORAL_TABLET | Freq: Every day | ORAL | Status: DC
  Administered 2023-09-05 – 2023-09-18 (×14): 75 mg
  Filled 2023-09-05 (×14): qty 1

## 2023-09-05 MED ORDER — ASPIRIN 325 MG PO TABS
325.0000 mg | ORAL_TABLET | Freq: Every day | ORAL | Status: DC
  Administered 2023-09-05 – 2023-09-18 (×14): 325 mg
  Filled 2023-09-05 (×14): qty 1

## 2023-09-05 MED ORDER — MEMANTINE HCL 10 MG PO TABS
10.0000 mg | ORAL_TABLET | Freq: Two times a day (BID) | ORAL | Status: DC
  Administered 2023-09-05 – 2023-09-18 (×27): 10 mg
  Filled 2023-09-05 (×27): qty 1

## 2023-09-05 NOTE — Progress Notes (Signed)
 PROGRESS NOTE    Christopher Murphy  ZOX:[id number] DOB: [birth year] DOA: 08/28/2023 PCP: Carollynn Cirri, NP   Brief Narrative:  78 years old male with past medical history of vascular dementia, prior CVA with right-sided hemiplegia, hyperlipidemia, aortic aneurysm, was transferred from [facility] for altered mental status.  He has been admitted with acute encephalopathy and aphasia secondary to seizures noted on EEG on 08/29/23.  Neurology following: On antiepileptics as per neurology.  Mental status has not much improved.  Plan is to continue to see if prognosis will improve over the next several days and if he becomes more alert and awake.  Palliative care following for goals of care discussion.  Currently also has cortrak feeding.  Assessment & Plan:   Acute encephalopathy: Persistent likely secondary to post ictal state Possible seizures Aphasia and unresponsiveness History of vascular dementia Prior CVA with right-sided hemiplegia Severe malnutrition Hyperlipidemia Deconditioning/debility -Neurology following.  MRI and EEG findings concerning for seizures.  Was initially on Keppra which was discontinued to minimize sedation by neurology.  Currently on amantadine.  Patient received high-dose IV thiamine.  Vitamin B12 398. - Seizure precautions.  Fall and aspiration precautions. - Mental status has not improved much: Continues to remain unresponsive. Currently has cortrak feeding - Palliative care following.  If mental status does not improve can consider hospice/comfort measures - Memantine  and donepezil  on hold.  Crestor  on hold. - Will need PT/OT eval once more awake  Right vertebral artery dissection - Neurology recommends DAPT for 90 days followed by Plavix  75 mg daily.  Switch rectal aspirin  to aspirin  via tube.  Add Plavix  via tube  Fever Possible aspiration pneumonia - Questionable cause.  No signs of infection.  Resolved.  Patient received Unasyn  for aspiration pneumonia until  09/02/2023.  LP was negative for CNS infection.  Continue to monitor off antibiotics.  Acute metabolic acidosis - Resolved  Hypokalemia - Replace.  Repeat a.m. labs  Hyponatremia - Monitor  Anemia of chronic disease Microcytosis -Outpatient follow-up.  Hemoglobin currently stable.  No signs of bleeding  Oral secretions - Continue scopolamine and glycopyrrolate   Aortic aneurysm - Noted.  Outpatient follow-up  Hypophosphatemia - Resolved  DVT prophylaxis: SCDs Code Status: DNR Family Communication: Daughter-in-law at bedside Disposition Plan: Status is: Inpatient Remains inpatient appropriate because: Of severity of illness  Consultants: Neurology/palliative care  Procedures: EEG  Antimicrobials:  Anti-infectives (From admission, onward)    Start     Dose/Rate Route Frequency Ordered Stop   09/04/23 1000  fluconazole (DIFLUCAN) tablet 100 mg  Status:  Discontinued        100 mg Per Tube Daily 09/04/23 0208 09/04/23 0209   09/04/23 0300  fluconazole (DIFLUCAN) IVPB 200 mg  Status:  Discontinued        200 mg 100 mL/hr over 60 Minutes Intravenous Daily 09/04/23 0209 09/04/23 0224   08/28/23 2145  Ampicillin -Sulbactam (UNASYN ) 3 g in sodium chloride  0.9 % 100 mL IVPB        3 g 200 mL/hr over 30 Minutes Intravenous Every 6 hours 08/28/23 2048 09/02/23 2144        Subjective: Patient seen and examined at bedside remains unresponsive.  Daughter-in-law present at bedside.  No fever, vomiting, agitation reported.  Objective: Vitals:   09/04/23 1955 09/04/23 2352 09/05/23 0455 09/05/23 0754  BP:  130/82 119/75 130/67  Pulse:  92 79 79  Resp:  16 16 18   Temp: 98.6 F (37 C) 98.5 F (36.9 C) 98.6 F (37 C)  97.8 F (36.6 C)  TempSrc: Axillary Oral Oral Oral  SpO2:  97% 97% 98%  Weight:   71.6 kg     Intake/Output Summary (Last 24 hours) at 09/05/2023 1001 Last data filed at 09/05/2023 0500 Gross per 24 hour  Intake 1564.56 ml  Output 500 ml  Net 1064.56 ml    Filed Weights   09/02/23 0705 09/04/23 0500 09/05/23 0455  Weight: 67.8 kg 67.5 kg 71.6 kg    Examination:  General exam: Appears calm and comfortable.  Looks chronically ill and deconditioned.  Extremely thinly built ENT: Cortrak present Respiratory system: Bilateral decreased breath sounds at bases, no wheezing Cardiovascular system: S1 & S2 heard, Rate controlled Gastrointestinal system: Abdomen is nondistended, soft and nontender. Normal bowel sounds heard. Extremities: No cyanosis, clubbing, edema  Central nervous system: Does not follow commands.  No seizure-like activity seen.  Right-sided hemiparesis present skin: No rashes, lesions or ulcers Psychiatry: Could not be assessed because of mental status    Data Reviewed: I have personally reviewed following labs and imaging studies  CBC: Recent Labs  Lab 08/30/23 1326 08/31/23 0757 09/01/23 0728 09/02/23 0534 09/03/23 0459 09/04/23 0613 09/05/23 0449  WBC 10.8* 9.0 8.2 7.9 9.1 10.2 7.6  NEUTROABS 7.4 5.9  --   --   --   --   --   HGB 12.2* 11.6* 11.0* 11.1* 11.3* 11.7* 11.6*  HCT 36.9* 34.7* 33.2* 33.6* 34.3* 34.4* 34.4*  MCV 79.7* 79.8* 79.6* 80.8 81.1 79.6* 79.3*  PLT 249 253 250 238 267 285 280   Basic Metabolic Panel: Recent Labs  Lab 09/01/23 0728 09/01/23 1434 09/02/23 0534 09/03/23 0459 09/04/23 0613 09/05/23 0449  NA 139  --  138 135 135 134*  K 3.0*  --  3.2* 3.7 3.2* 3.2*  CL 109  --  109 106 106 105  CO2 21*  --  21* 20* 21* 23  GLUCOSE 98  --  97 111* 110* 115*  BUN 14  --  13 11 12 14   CREATININE 0.76  --  0.76 0.73 0.66 0.75  CALCIUM  8.2*  --  7.9* 8.1* 8.0* 7.8*  MG 2.0  --  1.9 2.0 2.0 2.2  PHOS  --  2.7 2.5 2.5 2.1* 2.7   GFR: Estimated Creatinine Clearance: 78.3 mL/min (by C-G formula based on SCr of 0.75 mg/dL). Liver Function Tests: Recent Labs  Lab 08/30/23 1326 08/31/23 0757 09/04/23 0613  AST 19 15 28   ALT 9 10 26   ALKPHOS 46 46 71  BILITOT 0.7 0.8 0.7  PROT 6.4*  6.0* 6.1*  ALBUMIN 2.6* 2.4* 2.4*   No results for input(s): "LIPASE", "AMYLASE" in the last 168 hours. No results for input(s): "AMMONIA" in the last 168 hours. Coagulation Profile: No results for input(s): "INR", "PROTIME" in the last 168 hours. Cardiac Enzymes: No results for input(s): "CKTOTAL", "CKMB", "CKMBINDEX", "TROPONINI" in the last 168 hours. BNP (last 3 results) No results for input(s): "PROBNP" in the last 8760 hours. HbA1C: No results for input(s): "HGBA1C" in the last 72 hours. CBG: Recent Labs  Lab 09/04/23 0921 09/04/23 1146 09/04/23 1642 09/04/23 2347 09/05/23 0457  GLUCAP 121* 119* 122* 114* 128*   Lipid Profile: No results for input(s): "CHOL", "HDL", "LDLCALC", "TRIG", "CHOLHDL", "LDLDIRECT" in the last 72 hours. Thyroid  Function Tests: No results for input(s): "TSH", "T4TOTAL", "FREET4", "T3FREE", "THYROIDAB" in the last 72 hours. Anemia Panel: No results for input(s): "VITAMINB12", "FOLATE", "FERRITIN", "TIBC", "IRON", "RETICCTPCT" in the last 72 hours. Sepsis  Labs: No results for input(s): "PROCALCITON", "LATICACIDVEN" in the last 168 hours.  Recent Results (from the past 240 hours)  Respiratory (~20 pathogens) panel by PCR     Status: None   Collection Time: 08/28/23  8:41 PM   Specimen: Nasopharyngeal Swab; Respiratory  Result Value Ref Range Status   Adenovirus NOT DETECTED NOT DETECTED Final   Coronavirus 229E NOT DETECTED NOT DETECTED Final    Comment: (NOTE) The Coronavirus on the Respiratory Panel, DOES NOT test for the novel  Coronavirus (2019 nCoV)    Coronavirus HKU1 NOT DETECTED NOT DETECTED Final   Coronavirus NL63 NOT DETECTED NOT DETECTED Final   Coronavirus OC43 NOT DETECTED NOT DETECTED Final   Metapneumovirus NOT DETECTED NOT DETECTED Final   Rhinovirus / Enterovirus NOT DETECTED NOT DETECTED Final   Influenza A NOT DETECTED NOT DETECTED Final   Influenza B NOT DETECTED NOT DETECTED Final   Parainfluenza Virus 1 NOT DETECTED  NOT DETECTED Final   Parainfluenza Virus 2 NOT DETECTED NOT DETECTED Final   Parainfluenza Virus 3 NOT DETECTED NOT DETECTED Final   Parainfluenza Virus 4 NOT DETECTED NOT DETECTED Final   Respiratory Syncytial Virus NOT DETECTED NOT DETECTED Final   Bordetella pertussis NOT DETECTED NOT DETECTED Final   Bordetella Parapertussis NOT DETECTED NOT DETECTED Final   Chlamydophila pneumoniae NOT DETECTED NOT DETECTED Final   Mycoplasma pneumoniae NOT DETECTED NOT DETECTED Final    Comment: Performed at [facility], [address] [place], [place] [address]  SARS Coronavirus 2 by RT PCR (hospital order, performed in [facility] hospital lab) *cepheid single result test* Anterior Nasal Swab     Status: None   Collection Time: 08/28/23  8:41 PM   Specimen: Anterior Nasal Swab  Result Value Ref Range Status   SARS Coronavirus 2 by RT PCR NEGATIVE NEGATIVE Final    Comment: Performed at [facility], [address], [place], [place] [address]         Radiology Studies: DG CHEST PORT 1 VIEW Result Date: 09/04/2023 CLINICAL DATA:  Congestion upper respiratory tract EXAM: PORTABLE CHEST 1 VIEW COMPARISON:  Chest x-ray 08/28/2023 FINDINGS: The heart is enlarged, unchanged. Enteric tube tip is in the body of the stomach. There is minimal bibasilar atelectasis. There is no pleural effusion or pneumothorax. No acute fractures are seen. IMPRESSION: 1. Minimal bibasilar atelectasis. 2. Cardiomegaly. Electronically Signed   By: Tyron Gallon M.D.   On: 09/04/2023 14:44        Scheduled Meds:  amantadine  100 mg Per Tube BID   aspirin   325 mg Per Tube Daily   feeding supplement (PROSource TF20)  60 mL Per Tube Daily   glycopyrrolate   0.1 mg Intravenous Q8H   multivitamin with minerals  1 tablet Per Tube Daily   potassium chloride   40 mEq Oral Q4H   scopolamine  1 patch Transdermal Q72H   sodium chloride  flush  3 mL Intravenous Q12H   Continuous Infusions:  feeding supplement  (OSMOLITE 1.2 CAL) 40 mL/hr at 09/05/23 0000          Audria Leather, MD [facility] 09/05/2023, 10:01 AM

## 2023-09-05 NOTE — Progress Notes (Signed)
 Daily Progress Note   Patient Name: Christopher Murphy       Date: 09/05/2023 DOB: 11/03/1945  Age: 78 y.o. MRN#: [id number] Attending Physician: Audria Leather, MD Primary Care Physician: Carollynn Cirri, NP Admit Date: 08/28/2023  Reason for Consultation/Follow-up: Establishing goals of care  Subjective: Nonverbal - eyes open but no meaningful interaction  Length of Stay: 8  Current Medications: Scheduled Meds:   amantadine  100 mg Per Tube BID   aspirin   325 mg Per Tube Daily   clopidogrel   75 mg Per Tube Daily   feeding supplement (PROSource TF20)  60 mL Per Tube Daily   glycopyrrolate   0.1 mg Intravenous Q8H   multivitamin with minerals  1 tablet Per Tube Daily   potassium chloride   40 mEq Oral Q4H   scopolamine  1 patch Transdermal Q72H   sodium chloride  flush  3 mL Intravenous Q12H    Continuous Infusions:  feeding supplement (OSMOLITE 1.2 CAL) 40 mL/hr at 09/05/23 0000    PRN Meds: acetaminophen , hydrALAZINE  Physical Exam Constitutional:      General: He is not in acute distress.    Appearance: He is ill-appearing.     Comments: No meaningful interaction  Pulmonary:     Effort: Pulmonary effort is normal.  Skin:    General: Skin is warm and dry.  Neurological:     Comments: nonverbal            Vital Signs: BP 130/67 (BP Location: Left Arm)   Pulse 79   Temp 97.8 F (36.6 C) (Oral)   Resp 18   Wt 71.6 kg   SpO2 98%   BMI 22.65 kg/m  SpO2: SpO2: 98 % O2 Device: O2 Device: Room Air O2 Flow Rate: O2 Flow Rate (L/min): 0 L/min  Intake/output summary:  Intake/Output Summary (Last 24 hours) at 09/05/2023 1043 Last data filed at 09/05/2023 0500 Gross per 24 hour  Intake 1564.56 ml  Output 500 ml  Net 1064.56 ml   LBM: Last BM Date : 08/28/23 Baseline Weight:  Weight: 67.8 kg Most recent weight: Weight: 71.6 kg       Palliative Assessment/Data: PPS 30% w/cortrak - 10% without      Patient Active Problem List   Diagnosis Date Noted   DNR (do not resuscitate) 08/28/2023   Dementia without behavioral disturbance (HCC) 08/28/2023   Encephalopathy 08/28/2023   Decreased level of consciousness 08/28/2023   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   CVA (cerebral vascular accident) (HCC) 08/27/2023   History of CVA (cerebrovascular accident) 03/11/2022   Acute metabolic encephalopathy 03/10/2022   Hemiparesis affecting right side as late effect of cerebrovascular accident (CVA) (HCC) 03/10/2022   GERD (gastroesophageal reflux disease) 07/19/2021   Iron deficiency anemia secondary to inadequate dietary iron intake 01/23/2021   Aortic atherosclerosis (HCC) 01/23/2021   Thoracic aortic aneurysm (HCC) 06/02/2020   Fever    Vascular dementia (HCC) 10/28/2019   Chronic cough 10/19/2019    Palliative Care Assessment & Plan   HPI: 78 y.o. male   admitted on 08/28/2023 with past medical history of  vascular dementia, prior CVA with right-sided hemiplegia, HLD, aortic aneurysm, who is transferred from [facility] for altered mental  status.     He has been admitted with acute encephalopathy and aphasia started for seizures and remains on IV Keppra  which were performed 5/23 for further evaluation per neurology.     He was evaluated with long-term EEG with no seizure activity seen, but appears to have no improvement in mentation.     Patient remains minimally responsive to vigorous stimulation, unable to follow commands.    Family face treatment option decisions, advanced directive decisions and anticipatory care needs.  Assessment: Called to bedside by DIL.  DIL shares patient has not improved despite medical care.  She is inquiring what a transition to comfort measures would entail. We reviewed comfort care and measures to ensure comfort. We  discussed avoiding aggressive medical care not needed to promote comfort. We discussed that cortrak would be removed and his comfort would be promoted by mouth care. We discussed home with hospice vs  hospice facility. She initially tells me they are likely interested in hospice facility however she would like to wait for her spouse (patient's son) prior to making any decisions.  Received call back from DIL inquiring about where he could go and continue cortrak. We discussed that typically cortrak is only continued at the hospital and if he were to leave the hospital with feeding tube PEG would need to be discussed. Plan was made to meet with family again this afternoon for further discussion however no family present at bedside at agreed upon time. Went to room x2. Call to family with no answer. Will follow up again tomorrow.  Recommendations/Plan: Family considering comfort options - hospice care introduced, had follow up scheduled but family did not arrive; will try again tomorrow  Code Status: DNR  Care plan was discussed with patient's DIL, RN  Thank you for allowing the Palliative Medicine Team to assist in the care of this patient.   Total Time 35 minutes Prolonged Time Billed  no   Time spent includes: Detailed review of medical records (labs, imaging, vital signs), medically appropriate exam, discussion with treatment team, counseling and educating patient, family and/or staff, documenting clinical information, medication management and coordination of care.     *Please note that this is a verbal dictation therefore any spelling or grammatical errors are due to the "Dragon [employer] One" system interpretation.  Alvino Aye, DNP, [facility] Palliative Medicine Team Team Phone # [phone]  Pager [phone]

## 2023-09-05 NOTE — Progress Notes (Signed)
 Patient did not want to take Gabapentin. Was unable to return to pyxis. Gabapentin wasted with [place], [employer] fundraiser.  Davida Espy, RN

## 2023-09-05 NOTE — Progress Notes (Addendum)
 Subjective: No acute events overnight.  Slightly more awake today, did look at examiner and daughter-in-law at bedside  ROS: Unable to obtain due to poor mental status.  Examination  Vital signs in last 24 hours: Temp:  [97.8 F (36.6 C)-99.5 F (37.5 C)] 97.8 F (36.6 C) (05/29 0754) Pulse Rate:  [79-106] 79 (05/29 0754) Resp:  [16-22] 18 (05/29 0754) BP: (119-168)/(67-85) 130/67 (05/29 0754) SpO2:  [95 %-98 %] 98 % (05/29 0754) Weight:  [71.6 kg] 71.6 kg (05/29 0455)  General: lying in bed, NAD Neuro:  did open his eyes today and look at examiner briefly, did not follow commands, antigravity strength in all extremity with right hemiparesis  Basic Metabolic Panel: Recent Labs  Lab 09/01/23 0728 09/01/23 1434 09/02/23 0534 09/03/23 0459 09/04/23 0613 09/05/23 0449  NA 139  --  138 135 135 134*  K 3.0*  --  3.2* 3.7 3.2* 3.2*  CL 109  --  109 106 106 105  CO2 21*  --  21* 20* 21* 23  GLUCOSE 98  --  97 111* 110* 115*  BUN 14  --  13 11 12 14   CREATININE 0.76  --  0.76 0.73 0.66 0.75  CALCIUM  8.2*  --  7.9* 8.1* 8.0* 7.8*  MG 2.0  --  1.9 2.0 2.0 2.2  PHOS  --  2.7 2.5 2.5 2.1* 2.7    CBC: Recent Labs  Lab 08/30/23 1326 08/31/23 0757 09/01/23 0728 09/02/23 0534 09/03/23 0459 09/04/23 0613 09/05/23 0449  WBC 10.8* 9.0 8.2 7.9 9.1 10.2 7.6  NEUTROABS 7.4 5.9  --   --   --   --   --   HGB 12.2* 11.6* 11.0* 11.1* 11.3* 11.7* 11.6*  HCT 36.9* 34.7* 33.2* 33.6* 34.3* 34.4* 34.4*  MCV 79.7* 79.8* 79.6* 80.8 81.1 79.6* 79.3*  PLT 249 253 250 238 267 285 280     Coagulation Studies: No results for input(s): "LABPROT", "INR" in the last 72 hours.  Imaging No new brain imaging.   ASSESSMENT AND PLAN: 78 year old male with history of dementia, prior stroke with right-sided deficits who presented with speech disturbance and altered mental status since 08/27/2023.   Acute encephalopathy Aphasia - Encephalopathy and aphasia along with MRI and EEG findings  concerning for seizures   Recommendations - Continue amantadine 100 mg twice daily for neurostimulation -Okay to resume memantine  - It is likely that patient has poor neuro cognitive reserve and cognitive impairment which in the setting of repeated hospitalizations has led to gradual deterioration and progressive encephalopathy.   -If mental status does not improve, family is considering comfort care/hospice.  Appreciate palliative care team's help - Continue seizure precautions - prn versed  for clinical seizure lasting more than 2 minutes -Discussed plan with daughter-in-law at bedside and Dr. Donne Gage via secure chat     I have spent a total of  27 minutes with the patient reviewing hospital notes,  test results, labs and examining the patient as well as establishing an assessment and plan.  > 50% of time was spent in direct patient care.      Roxy Cordial Epilepsy [facility] For questions after 5pm please refer to AMION to reach the Neurologist on call

## 2023-09-06 DIAGNOSIS — G9340 Encephalopathy, unspecified: Secondary | ICD-10-CM | POA: Diagnosis not present

## 2023-09-06 DIAGNOSIS — F0390 Unspecified dementia without behavioral disturbance: Secondary | ICD-10-CM

## 2023-09-06 DIAGNOSIS — R569 Unspecified convulsions: Secondary | ICD-10-CM | POA: Diagnosis not present

## 2023-09-06 DIAGNOSIS — I69398 Other sequelae of cerebral infarction: Secondary | ICD-10-CM | POA: Diagnosis not present

## 2023-09-06 LAB — BASIC METABOLIC PANEL WITH GFR
Anion gap: 7 (ref 5–15)
BUN: 16 mg/dL (ref 8–23)
CO2: 22 mmol/L (ref 22–32)
Calcium: 8 mg/dL — ABNORMAL LOW (ref 8.9–10.3)
Chloride: 106 mmol/L (ref 98–111)
Creatinine, Ser: 0.8 mg/dL (ref 0.61–1.24)
GFR, Estimated: >60 mL/min (ref >60)
Glucose, Bld: 121 mg/dL — ABNORMAL HIGH (ref 70–99)
Potassium: 4 mmol/L (ref 3.5–5.1)
Sodium: 135 mmol/L (ref 135–145)

## 2023-09-06 LAB — GLUCOSE, CAPILLARY
Glucose-Capillary: 101 mg/dL — ABNORMAL HIGH (ref 70–99)
Glucose-Capillary: 113 mg/dL — ABNORMAL HIGH (ref 70–99)
Glucose-Capillary: 114 mg/dL — ABNORMAL HIGH (ref 70–99)
Glucose-Capillary: 125 mg/dL — ABNORMAL HIGH (ref 70–99)

## 2023-09-06 LAB — MAGNESIUM: Magnesium: 2 mg/dL (ref 1.7–2.4)

## 2023-09-06 MED ORDER — ORAL CARE MOUTH RINSE
15.0000 mL | OROMUCOSAL | Status: DC | PRN
  Administered 2023-09-06: 15 mL via OROMUCOSAL

## 2023-09-06 MED ORDER — SENNOSIDES-DOCUSATE SODIUM 8.6-50 MG PO TABS
1.0000 | ORAL_TABLET | Freq: Two times a day (BID) | ORAL | Status: DC
  Filled 2023-09-06: qty 1

## 2023-09-06 MED ORDER — POLYETHYLENE GLYCOL 3350 17 G PO PACK
17.0000 g | PACK | Freq: Every day | ORAL | Status: DC | PRN
Start: 2023-09-06 — End: 2023-09-18
  Administered 2023-09-14: 17 g
  Filled 2023-09-06: qty 1

## 2023-09-06 MED ORDER — SENNOSIDES 8.8 MG/5ML PO SYRP
5.0000 mL | ORAL_SOLUTION | Freq: Two times a day (BID) | ORAL | Status: DC
  Administered 2023-09-06 – 2023-09-18 (×23): 5 mL
  Filled 2023-09-06 (×24): qty 5

## 2023-09-06 MED ORDER — DOCUSATE SODIUM 50 MG/5ML PO LIQD
50.0000 mg | Freq: Two times a day (BID) | ORAL | Status: DC
  Administered 2023-09-06 – 2023-09-18 (×22): 50 mg
  Filled 2023-09-06 (×22): qty 10

## 2023-09-06 MED ORDER — ORAL CARE MOUTH RINSE
15.0000 mL | OROMUCOSAL | Status: DC
  Administered 2023-09-06 – 2023-09-19 (×50): 15 mL via OROMUCOSAL

## 2023-09-06 NOTE — Progress Notes (Signed)
 Subjective: NAEO. Still minimally responsive. Wife and DIL at bedside.  ROS: Unable to obtain due to poor mental status  Examination  Vital signs in last 24 hours: Temp:  [97.3 F (36.3 C)-98.9 F (37.2 C)] 98.8 F (37.1 C) (05/30 1224) Pulse Rate:  [73-81] 80 (05/30 1224) Resp:  [14-20] 16 (05/30 1224) BP: (122-140)/(68-78) 122/68 (05/30 1224) SpO2:  [97 %-99 %] 98 % (05/30 1224) Weight:  [71.4 kg-71.6 kg] 71.4 kg (05/30 0509)  General: lying in bed, NAD Neuro: Asleep, does barely open eyes to repeated tactile stimulation, doesn't follow commands, antigravity strength in all extremity with right hemiparesis  Basic Metabolic Panel: Recent Labs  Lab 09/01/23 1434 09/02/23 0534 09/03/23 0459 09/04/23 0613 09/05/23 0449 09/06/23 0418  NA  --  138 135 135 134* 135  K  --  3.2* 3.7 3.2* 3.2* 4.0  CL  --  109 106 106 105 106  CO2  --  21* 20* 21* 23 22  GLUCOSE  --  97 111* 110* 115* 121*  BUN  --  13 11 12 14 16   CREATININE  --  0.76 0.73 0.66 0.75 0.80  CALCIUM   --  7.9* 8.1* 8.0* 7.8* 8.0*  MG  --  1.9 2.0 2.0 2.2 2.0  PHOS 2.7 2.5 2.5 2.1* 2.7  --     CBC: Recent Labs  Lab 08/31/23 0757 09/01/23 0728 09/02/23 0534 09/03/23 0459 09/04/23 0613 09/05/23 0449  WBC 9.0 8.2 7.9 9.1 10.2 7.6  NEUTROABS 5.9  --   --   --   --   --   HGB 11.6* 11.0* 11.1* 11.3* 11.7* 11.6*  HCT 34.7* 33.2* 33.6* 34.3* 34.4* 34.4*  MCV 79.8* 79.6* 80.8 81.1 79.6* 79.3*  PLT 253 250 238 267 285 280     Coagulation Studies: No results for input(s): "LABPROT", "INR" in the last 72 hours.  Imaging No new brain imaging   ASSESSMENT AND PLAN:78-year-old male with history of dementia, prior stroke with right-sided deficits who presented with speech disturbance and altered mental status since 08/27/2023.   Acute encephalopathy Aphasia - Encephalopathy and aphasia along with MRI and EEG findings concerning for seizures   Recommendations - Continue amantadine 100 mg twice daily for  neurostimulation - It is likely that patient has poor neuro cognitive reserve and cognitive impairment which in the setting of repeated hospitalizations has led to gradual deterioration and progressive encephalopathy.   - Without significant improvement in mental status, I discussed with family about hospice vs peg tube. Family considering waiting for some more time and then make a final decision.  Appreciate palliative care team's help - Continue seizure precautions - prn versed  for clinical seizure lasting more than 2 minutes -Discussed plan with daughter-in-law and wife at bedside and son on phone    I have spent a total of  30 minutes with the patient reviewing hospital notes,  test results, labs and examining the patient as well as establishing an assessment and plan.  > 50% of time was spent in direct patient care.        Roxy Cordial Epilepsy [facility] For questions after 5pm please refer to AMION to reach the Neurologist on call

## 2023-09-06 NOTE — Progress Notes (Signed)
 PROGRESS NOTE    Christopher Murphy  UEA:[id number] DOB: 03-09-1946 DOA: 08/28/2023 PCP: Carollynn Cirri, NP   Brief Narrative:  78 years old male with past medical history of vascular dementia, prior CVA with right-sided hemiplegia, hyperlipidemia, aortic aneurysm, was transferred from [facility] for altered mental status.  He has been admitted with acute encephalopathy and aphasia secondary to seizures noted on EEG on 08/29/23.  Neurology following: On antiepileptics as per neurology.  Mental status has not much improved.  Plan is to continue to see if prognosis will improve over the next several days and if he becomes more alert and awake.  Palliative care following for goals of care discussion.  Currently also has cortrak feeding.  Assessment & Plan:   Acute encephalopathy: Persistent likely secondary to post ictal state Possible seizures Aphasia and unresponsiveness History of vascular dementia Prior CVA with right-sided hemiplegia Severe malnutrition Hyperlipidemia Deconditioning/debility -Neurology following.  MRI and EEG findings concerning for seizures.  Was initially on Keppra which was discontinued to minimize sedation by neurology.  Currently on amantadine.  Patient received high-dose IV thiamine.  Vitamin B12 398. - Seizure precautions.  Fall and aspiration precautions. - Mental status has not improved much: Continues to remain unresponsive. Currently has cortrak feeding - Palliative care following.  If mental status does not improve: can consider hospice/comfort measures - Memantine  resumed on 09/05/2023 as per neurology recommendations.  Donepezil  on hold.  Crestor  on hold. - Daughter-in-law requesting PT/OT eval.  Right vertebral artery dissection - Neurology recommends DAPT for 90 days followed by Plavix  75 mg daily.  Continue aspirin  and Plavix  via tube.  Fever Possible aspiration pneumonia - Questionable cause.  No signs of infection.  Resolved.  Patient received Unasyn   for aspiration pneumonia until 09/02/2023.  LP was negative for CNS infection.  Continue to monitor off antibiotics.  Acute metabolic acidosis - Resolved  Hypokalemia - Improved  Hyponatremia - Improved  Anemia of chronic disease Microcytosis -Outpatient follow-up.  Hemoglobin currently stable.  Monitor intermittently no signs of bleeding  Oral secretions - Continue scopolamine and glycopyrrolate   Aortic aneurysm - Noted.  Outpatient follow-up  Hypophosphatemia - Resolved  DVT prophylaxis: SCDs Code Status: DNR Family Communication: Daughter-in-law at bedside Disposition Plan: Status is: Inpatient Remains inpatient appropriate because: Of severity of illness  Consultants: Neurology/palliative care  Procedures: EEG  Antimicrobials:  Anti-infectives (From admission, onward)    Start     Dose/Rate Route Frequency Ordered Stop   09/04/23 1000  fluconazole (DIFLUCAN) tablet 100 mg  Status:  Discontinued        100 mg Per Tube Daily 09/04/23 0208 09/04/23 0209   09/04/23 0300  fluconazole (DIFLUCAN) IVPB 200 mg  Status:  Discontinued        200 mg 100 mL/hr over 60 Minutes Intravenous Daily 09/04/23 0209 09/04/23 0224   08/28/23 2145  Ampicillin -Sulbactam (UNASYN ) 3 g in sodium chloride  0.9 % 100 mL IVPB        3 g 200 mL/hr over 30 Minutes Intravenous Every 6 hours 08/28/23 2048 09/02/23 2144        Subjective: Patient seen and examined at bedside.  No seizures, agitation, vomiting reported.  Objective: Vitals:   09/05/23 1727 09/05/23 2034 09/05/23 2359 09/06/23 0509  BP:  123/71 124/70 (!) 140/78  Pulse:  78 76 77  Resp:  16 16 16   Temp:  98.7 F (37.1 C) 98.9 F (37.2 C) 98.6 F (37 C)  TempSrc:  Axillary Axillary Axillary  SpO2:  99% 99% 98%  Weight: 71.6 kg   71.4 kg  Height: 5\' 10"  (1.778 m)       Intake/Output Summary (Last 24 hours) at 09/06/2023 0829 Last data filed at 09/06/2023 0528 Gross per 24 hour  Intake 675.83 ml  Output 900 ml  Net  -224.17 ml   Filed Weights   09/05/23 0455 09/05/23 1727 09/06/23 0509  Weight: 71.6 kg 71.6 kg 71.4 kg    Examination:  General: On room air.  No distress.  Chronically ill and deconditioned looking.  Extremely thinly built ENT/neck: No thyromegaly.  JVD is not elevated.  Has cortrak respiratory: Decreased breath sounds at bases bilaterally with some crackles; no wheezing  CVS: S1-S2 heard, rate controlled currently Abdominal: Soft, nontender, slightly distended; no organomegaly, bowel sounds are heard Extremities: Trace lower extremity edema; no cyanosis  CNS: Hardly wakes up, does not follow any commands.  Right-sided hemiparesis present lymph: No obvious lymphadenopathy Skin: No obvious ecchymosis/lesions  psych: Cannot assess because of mental status musculoskeletal: No obvious joint swelling/deformity     Data Reviewed: I have personally reviewed following labs and imaging studies  CBC: Recent Labs  Lab 08/30/23 1326 08/31/23 0757 09/01/23 0728 09/02/23 0534 09/03/23 0459 09/04/23 0613 09/05/23 0449  WBC 10.8* 9.0 8.2 7.9 9.1 10.2 7.6  NEUTROABS 7.4 5.9  --   --   --   --   --   HGB 12.2* 11.6* 11.0* 11.1* 11.3* 11.7* 11.6*  HCT 36.9* 34.7* 33.2* 33.6* 34.3* 34.4* 34.4*  MCV 79.7* 79.8* 79.6* 80.8 81.1 79.6* 79.3*  PLT 249 253 250 238 267 285 280   Basic Metabolic Panel: Recent Labs  Lab 09/01/23 1434 09/02/23 0534 09/03/23 0459 09/04/23 0613 09/05/23 0449 09/06/23 0418  NA  --  138 135 135 134* 135  K  --  3.2* 3.7 3.2* 3.2* 4.0  CL  --  109 106 106 105 106  CO2  --  21* 20* 21* 23 22  GLUCOSE  --  97 111* 110* 115* 121*  BUN  --  13 11 12 14 16   CREATININE  --  0.76 0.73 0.66 0.75 0.80  CALCIUM   --  7.9* 8.1* 8.0* 7.8* 8.0*  MG  --  1.9 2.0 2.0 2.2 2.0  PHOS 2.7 2.5 2.5 2.1* 2.7  --    GFR: Estimated Creatinine Clearance: 78.1 mL/min (by C-G formula based on SCr of 0.8 mg/dL). Liver Function Tests: Recent Labs  Lab 08/30/23 1326  08/31/23 0757 09/04/23 0613  AST 19 15 28   ALT 9 10 26   ALKPHOS 46 46 71  BILITOT 0.7 0.8 0.7  PROT 6.4* 6.0* 6.1*  ALBUMIN 2.6* 2.4* 2.4*   No results for input(s): "LIPASE", "AMYLASE" in the last 168 hours. No results for input(s): "AMMONIA" in the last 168 hours. Coagulation Profile: No results for input(s): "INR", "PROTIME" in the last 168 hours. Cardiac Enzymes: No results for input(s): "CKTOTAL", "CKMB", "CKMBINDEX", "TROPONINI" in the last 168 hours. BNP (last 3 results) No results for input(s): "PROBNP" in the last 8760 hours. HbA1C: No results for input(s): "HGBA1C" in the last 72 hours. CBG: Recent Labs  Lab 09/05/23 0457 09/05/23 1219 09/05/23 1825 09/05/23 2358 09/06/23 0608  GLUCAP 128* 107* 124* 125* 113*   Lipid Profile: No results for input(s): "CHOL", "HDL", "LDLCALC", "TRIG", "CHOLHDL", "LDLDIRECT" in the last 72 hours. Thyroid  Function Tests: No results for input(s): "TSH", "T4TOTAL", "FREET4", "T3FREE", "THYROIDAB" in the last 72 hours. Anemia Panel: No results for input(s): "  VITAMINB12", "FOLATE", "FERRITIN", "TIBC", "IRON", "RETICCTPCT" in the last 72 hours. Sepsis Labs: No results for input(s): "PROCALCITON", "LATICACIDVEN" in the last 168 hours.  Recent Results (from the past 240 hours)  Respiratory (~20 pathogens) panel by PCR     Status: None   Collection Time: 08/28/23  8:41 PM   Specimen: Nasopharyngeal Swab; Respiratory  Result Value Ref Range Status   Adenovirus NOT DETECTED NOT DETECTED Final   Coronavirus 229E NOT DETECTED NOT DETECTED Final    Comment: (NOTE) The Coronavirus on the Respiratory Panel, DOES NOT test for the novel  Coronavirus (2019 nCoV)    Coronavirus HKU1 NOT DETECTED NOT DETECTED Final   Coronavirus NL63 NOT DETECTED NOT DETECTED Final   Coronavirus OC43 NOT DETECTED NOT DETECTED Final   Metapneumovirus NOT DETECTED NOT DETECTED Final   Rhinovirus / Enterovirus NOT DETECTED NOT DETECTED Final   Influenza A NOT  DETECTED NOT DETECTED Final   Influenza B NOT DETECTED NOT DETECTED Final   Parainfluenza Virus 1 NOT DETECTED NOT DETECTED Final   Parainfluenza Virus 2 NOT DETECTED NOT DETECTED Final   Parainfluenza Virus 3 NOT DETECTED NOT DETECTED Final   Parainfluenza Virus 4 NOT DETECTED NOT DETECTED Final   Respiratory Syncytial Virus NOT DETECTED NOT DETECTED Final   Bordetella pertussis NOT DETECTED NOT DETECTED Final   Bordetella Parapertussis NOT DETECTED NOT DETECTED Final   Chlamydophila pneumoniae NOT DETECTED NOT DETECTED Final   Mycoplasma pneumoniae NOT DETECTED NOT DETECTED Final    Comment: Performed at [facility], [address], [place], [place] [address]  SARS Coronavirus 2 by RT PCR (hospital order, performed in [facility] hospital lab) *cepheid single result test* Anterior Nasal Swab     Status: None   Collection Time: 08/28/23  8:41 PM   Specimen: Anterior Nasal Swab  Result Value Ref Range Status   SARS Coronavirus 2 by RT PCR NEGATIVE NEGATIVE Final    Comment: Performed at [facility], [address] [place], [place] [address]         Radiology Studies: DG CHEST PORT 1 VIEW Result Date: 09/04/2023 CLINICAL DATA:  Congestion upper respiratory tract EXAM: PORTABLE CHEST 1 VIEW COMPARISON:  Chest x-ray 08/28/2023 FINDINGS: The heart is enlarged, unchanged. Enteric tube tip is in the body of the stomach. There is minimal bibasilar atelectasis. There is no pleural effusion or pneumothorax. No acute fractures are seen. IMPRESSION: 1. Minimal bibasilar atelectasis. 2. Cardiomegaly. Electronically Signed   By: Tyron Gallon M.D.   On: 09/04/2023 14:44        Scheduled Meds:  amantadine  100 mg Per Tube BID   aspirin   325 mg Per Tube Daily   clopidogrel   75 mg Per Tube Daily   feeding supplement (PROSource TF20)  60 mL Per Tube Daily   glycopyrrolate   0.1 mg Intravenous Q8H   memantine   10 mg Per Tube BID   multivitamin with minerals  1 tablet Per Tube  Daily   scopolamine  1 patch Transdermal Q72H   sodium chloride  flush  3 mL Intravenous Q12H   Continuous Infusions:  feeding supplement (OSMOLITE 1.2 CAL) 60 mL/hr at 09/05/23 1926          Audria Leather, MD [facility] 09/06/2023, 8:29 AM

## 2023-09-06 NOTE — Plan of Care (Signed)
  Problem: Activity: Goal: Risk for activity intolerance will decrease Outcome: Not Progressing   Problem: Elimination: Goal: Will not experience complications related to bowel motility Outcome: Not Progressing   

## 2023-09-06 NOTE — Progress Notes (Signed)
 Patient suctioned via nares with removal of moderate amount of white secretions. Patient tolerated the procedure fairly well.

## 2023-09-06 NOTE — Progress Notes (Signed)
 Daily Progress Note   Patient Name: Christopher Murphy       Date: 09/06/2023 DOB: October 27, 1945  Age: 78 y.o. MRN#: [id number] Attending Physician: Audria Leather, MD Primary Care Physician: Carollynn Cirri, NP Admit Date: 08/28/2023  Reason for Consultation/Follow-up: Establishing goals of care  Subjective: Nonverbal - eyes open but no meaningful interaction  Length of Stay: 9  Current Medications: Scheduled Meds:   amantadine  100 mg Per Tube BID   aspirin   325 mg Per Tube Daily   clopidogrel   75 mg Per Tube Daily   feeding supplement (PROSource TF20)  60 mL Per Tube Daily   glycopyrrolate   0.1 mg Intravenous Q8H   memantine   10 mg Per Tube BID   multivitamin with minerals  1 tablet Per Tube Daily   mouth rinse  15 mL Mouth Rinse 4 times per day   scopolamine  1 patch Transdermal Q72H   sodium chloride  flush  3 mL Intravenous Q12H    Continuous Infusions:  feeding supplement (OSMOLITE 1.2 CAL) 60 mL/hr at 09/06/23 1100    PRN Meds: acetaminophen , hydrALAZINE, mouth rinse  Physical Exam Constitutional:      General: He is not in acute distress.    Appearance: He is ill-appearing.     Comments: No meaningful interaction  Pulmonary:     Effort: Pulmonary effort is normal.  Skin:    General: Skin is warm and dry.  Neurological:     Comments: nonverbal             Vital Signs: BP 126/69 (BP Location: Right Arm)   Pulse 73   Temp 98.8 F (37.1 C) (Oral)   Resp 16   Ht 5\' 10"  (1.778 m)   Wt 71.4 kg   SpO2 98%   BMI 22.59 kg/m  SpO2: SpO2: 98 % O2 Device: O2 Device: Room Air O2 Flow Rate: O2 Flow Rate (L/min): 0 L/min  Intake/output summary:  Intake/Output Summary (Last 24 hours) at 09/06/2023 1228 Last data filed at 09/06/2023 1225 Gross per 24 hour  Intake 1856.83 ml   Output 1350 ml  Net 506.83 ml   LBM: Last BM Date : 09/02/23 Baseline Weight: Weight: 67.8 kg Most recent weight: Weight: 71.4 kg       Palliative Assessment/Data: PPS 30% w/cortrak - 10% without      Patient Active Problem List   Diagnosis Date Noted   Protein-calorie malnutrition, severe 09/05/2023   DNR (do not resuscitate) 08/28/2023   Dementia without behavioral disturbance (HCC) 08/28/2023   Encephalopathy 08/28/2023   Decreased level of consciousness 08/28/2023   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   CVA (cerebral vascular accident) (HCC) 08/27/2023   History of CVA (cerebrovascular accident) 03/11/2022   Acute metabolic encephalopathy 03/10/2022   Hemiparesis affecting right side as late effect of cerebrovascular accident (CVA) (HCC) 03/10/2022   GERD (gastroesophageal reflux disease) 07/19/2021   Iron deficiency anemia secondary to inadequate dietary iron intake 01/23/2021   Aortic atherosclerosis (HCC) 01/23/2021   Thoracic aortic aneurysm (HCC) 06/02/2020   Fever    Vascular dementia (HCC) 10/28/2019   Chronic cough 10/19/2019    Palliative Care Assessment & Plan   HPI: 78 y.o. male  admitted on 08/28/2023 with past medical history of  vascular dementia, prior CVA with right-sided hemiplegia, HLD, aortic aneurysm, who is transferred from [facility] for altered mental status.     He has been admitted with acute encephalopathy and aphasia started for seizures and remains on IV Keppra which were performed 5/23 for further evaluation per neurology.     He was evaluated with long-term EEG with no seizure activity seen, but appears to have no improvement in mentation.     Patient remains minimally responsive to vigorous stimulation, unable to follow commands.    Family face treatment option decisions, advanced directive decisions and anticipatory care needs.  Assessment: Wife and DIL at bedside today. DIL share he had a brief period yesterday in which he  was more alert. However, this AM he is not interactive again. We discussed some fluctuations in mental status to be expected.  We again reviewed option of transitioning to comfort measures only. She also asks what it would look like to continue current measures - we review that cortrak is temporary measure; to continue long term artificial nutrition he would need a PEG and likely LTC facility. We discussed that in current state he is not able to rehab.  She shares she is unsure and would like me to speak with her spouse/patient's son. I review my attempts to see him yesterday. She tells me he will be here today around 3 pm. Today I visited bedside 2:45, 3, and 4:15 and no family was present. RN confirmed son had not been there. Attempted to call him to arrange time to meet but no answer.   Discussed with Dr Merceda Stairs who spoke with family earlier today and suggested hospice.   Recommendations/Plan: Family considering comfort care vs peg/LTC - awaiting more conversation with son but have had difficulty meeting him as described above  Code Status: DNR  Care plan was discussed with patient's DIL, RN  Thank you for allowing the Palliative Medicine Team to assist in the care of this patient.   Total Time 35 minutes Prolonged Time Billed  no   Time spent includes: Detailed review of medical records (labs, imaging, vital signs), medically appropriate exam, discussion with treatment team, counseling and educating patient, family and/or staff, documenting clinical information, medication management and coordination of care.     *Please note that this is a verbal dictation therefore any spelling or grammatical errors are due to the "Dragon Medical One" system interpretation.  Alvino Aye, DNP, [facility] Palliative Medicine Team Team Phone # [phone]  Pager [phone]

## 2023-09-06 NOTE — Progress Notes (Signed)
 PT Cancellation Note  Patient Details Name: Christopher Murphy MRN: [id number] DOB: [birth year]   Cancelled Treatment:    Reason Eval/Treat Not Completed: Patient's level of consciousness;Fatigue/lethargy limiting ability to participate (PT consult appreciated and chart reviewed. Per RN, pt has no command follow and is extremly lethargic barely opening his eye to repeated tactile stimulation. Will follow-up for PT evaulation when pt is more medically appropriate.)  Glenford Lanes, PT, DPT Acute Rehabilitation Services Office: [phone] Secure Chat Preferred  [name] 09/06/2023, 4:43 PM

## 2023-09-07 DIAGNOSIS — G9340 Encephalopathy, unspecified: Secondary | ICD-10-CM | POA: Diagnosis not present

## 2023-09-07 LAB — GLUCOSE, CAPILLARY
Glucose-Capillary: 113 mg/dL — ABNORMAL HIGH (ref 70–99)
Glucose-Capillary: 117 mg/dL — ABNORMAL HIGH (ref 70–99)
Glucose-Capillary: 119 mg/dL — ABNORMAL HIGH (ref 70–99)
Glucose-Capillary: 128 mg/dL — ABNORMAL HIGH (ref 70–99)

## 2023-09-07 NOTE — Progress Notes (Signed)
 PROGRESS NOTE    Christopher Murphy  WUJ:[id number] DOB: 27-Sep-1945 DOA: 08/28/2023 PCP: Carollynn Cirri, NP   Brief Narrative:  78 years old male with past medical history of vascular dementia, prior CVA with right-sided hemiplegia, hyperlipidemia, aortic aneurysm, was transferred from [facility] for altered mental status.  He has been admitted with acute encephalopathy and aphasia secondary to seizures noted on EEG on 08/29/23.  Neurology following: On antiepileptics as per neurology.  Mental status has not much improved.  Plan is to continue to see if prognosis will improve over the next several days and if he becomes more alert and awake.  Palliative care following for goals of care discussion.  Currently also has cortrak feeding.  Assessment & Plan:   Acute encephalopathy: Persistent likely secondary to post ictal state Possible seizures Aphasia and unresponsiveness History of vascular dementia Prior CVA with right-sided hemiplegia Severe malnutrition Hyperlipidemia Deconditioning/debility -Neurology following.  MRI and EEG findings concerning for seizures.  Was initially on Keppra  which was discontinued to minimize sedation by neurology.  Currently on amantadine .  Patient received high-dose IV thiamine .  Vitamin B12 398. - Seizure precautions.  Fall and aspiration precautions. - Mental status has not improved much: Continues to remain unresponsive. Currently has cortrak feeding - Palliative care following.  If mental status does not improve: can consider hospice/comfort measures.  Family undecided. - Memantine  resumed on 09/05/2023 as per neurology recommendations.  Donepezil  on hold.  Crestor  on hold.  Right vertebral artery dissection - Neurology recommends DAPT for 90 days followed by Plavix  75 mg daily.  Continue aspirin  and Plavix  via tube.  Fever Possible aspiration pneumonia - Questionable cause.  No signs of infection.  Resolved.  Patient received Unasyn  for aspiration  pneumonia until 09/02/2023.  LP was negative for CNS infection.  Continue to monitor off antibiotics.  Acute metabolic acidosis - Resolved  Hypokalemia - Improved  Hyponatremia - Improved  Anemia of chronic disease Microcytosis -Outpatient follow-up.  Hemoglobin currently stable.  Monitor intermittently no signs of bleeding  Oral secretions - Continue scopolamine  and glycopyrrolate   Aortic aneurysm - Noted.  Outpatient follow-up  Hypophosphatemia - Resolved  DVT prophylaxis: SCDs Code Status: DNR Family Communication: Daughter-in-law at bedside on 09/06/23.  None bedside today Disposition Plan: Status is: Inpatient Remains inpatient appropriate because: Of severity of illness  Consultants: Neurology/palliative care  Procedures: EEG  Antimicrobials:  Anti-infectives (From admission, onward)    Start     Dose/Rate Route Frequency Ordered Stop   09/04/23 1000  fluconazole  (DIFLUCAN ) tablet 100 mg  Status:  Discontinued        100 mg Per Tube Daily 09/04/23 0208 09/04/23 0209   09/04/23 0300  fluconazole  (DIFLUCAN ) IVPB 200 mg  Status:  Discontinued        200 mg 100 mL/hr over 60 Minutes Intravenous Daily 09/04/23 0209 09/04/23 0224   08/28/23 2145  Ampicillin -Sulbactam (UNASYN ) 3 g in sodium chloride  0.9 % 100 mL IVPB        3 g 200 mL/hr over 30 Minutes Intravenous Every 6 hours 08/28/23 2048 09/02/23 2144        Subjective: Patient seen and examined at bedside.  No agitation, seizures, fever or vomiting reported. Objective: Vitals:   09/06/23 2356 09/07/23 0400 09/07/23 0445 09/07/23 0811  BP: 124/73 117/75  138/82  Pulse: 92 (!) 101  (!) 118  Resp: 20 18  (!) 22  Temp: 98 F (36.7 C) 98.1 F (36.7 C)  98.9 F (37.2 C)  TempSrc:  Axillary Axillary  Axillary  SpO2: 98% 97%  98%  Weight:   71.2 kg   Height:        Intake/Output Summary (Last 24 hours) at 09/07/2023 0818 Last data filed at 09/07/2023 0813 Gross per 24 hour  Intake 1416 ml  Output 1350  ml  Net 66 ml   Filed Weights   09/05/23 1727 09/06/23 0509 09/07/23 0445  Weight: 71.6 kg 71.4 kg 71.2 kg    Examination:  General: Currently in no distress and on room air.  Chronically ill and deconditioned looking.  Extremely thinly built ENT/neck: No neck masses or elevated JVD noted clear he has cortrak respiratory: Bilateral decreased breath sounds at bases with scattered crackles and intermittent tachypnea CVS: Tachycardic intermittently; S1 and S2 are heard Abdominal: Soft, nontender, distended slightly; no organomegaly, bowel sounds are heard normally Extremities: No clubbing; trace lower extremity edema bilaterally  CNS: Does not follow any commands.  Right-sided hemiparesis present  lymph: No obvious palpable lymphadenopathy Skin: No obvious petechia/rashes psych: Still not able to assess because of mental status  musculoskeletal: No obvious joint tenderness/erythema    Data Reviewed: I have personally reviewed following labs and imaging studies  CBC: Recent Labs  Lab 09/01/23 0728 09/02/23 0534 09/03/23 0459 09/04/23 0613 09/05/23 0449  WBC 8.2 7.9 9.1 10.2 7.6  HGB 11.0* 11.1* 11.3* 11.7* 11.6*  HCT 33.2* 33.6* 34.3* 34.4* 34.4*  MCV 79.6* 80.8 81.1 79.6* 79.3*  PLT 250 238 267 285 280   Basic Metabolic Panel: Recent Labs  Lab 09/01/23 1434 09/02/23 0534 09/03/23 0459 09/04/23 0613 09/05/23 0449 09/06/23 0418  NA  --  138 135 135 134* 135  K  --  3.2* 3.7 3.2* 3.2* 4.0  CL  --  109 106 106 105 106  CO2  --  21* 20* 21* 23 22  GLUCOSE  --  97 111* 110* 115* 121*  BUN  --  13 11 12 14 16   CREATININE  --  0.76 0.73 0.66 0.75 0.80  CALCIUM   --  7.9* 8.1* 8.0* 7.8* 8.0*  MG  --  1.9 2.0 2.0 2.2 2.0  PHOS 2.7 2.5 2.5 2.1* 2.7  --    GFR: Estimated Creatinine Clearance: 77.9 mL/min (by C-G formula based on SCr of 0.8 mg/dL). Liver Function Tests: Recent Labs  Lab 09/04/23 0613  AST 28  ALT 26  ALKPHOS 71  BILITOT 0.7  PROT 6.1*  ALBUMIN  2.4*   No results for input(s): "LIPASE", "AMYLASE" in the last 168 hours. No results for input(s): "AMMONIA" in the last 168 hours. Coagulation Profile: No results for input(s): "INR", "PROTIME" in the last 168 hours. Cardiac Enzymes: No results for input(s): "CKTOTAL", "CKMB", "CKMBINDEX", "TROPONINI" in the last 168 hours. BNP (last 3 results) No results for input(s): "PROBNP" in the last 8760 hours. HbA1C: No results for input(s): "HGBA1C" in the last 72 hours. CBG: Recent Labs  Lab 09/06/23 0608 09/06/23 1222 09/06/23 1839 09/07/23 0004 09/07/23 0503  GLUCAP 113* 101* 114* 113* 117*   Lipid Profile: No results for input(s): "CHOL", "HDL", "LDLCALC", "TRIG", "CHOLHDL", "LDLDIRECT" in the last 72 hours. Thyroid  Function Tests: No results for input(s): "TSH", "T4TOTAL", "FREET4", "T3FREE", "THYROIDAB" in the last 72 hours. Anemia Panel: No results for input(s): "VITAMINB12", "FOLATE", "FERRITIN", "TIBC", "IRON", "RETICCTPCT" in the last 72 hours. Sepsis Labs: No results for input(s): "PROCALCITON", "LATICACIDVEN" in the last 168 hours.  Recent Results (from the past 240 hours)  Respiratory (~20 pathogens) panel by  PCR     Status: None   Collection Time: 08/28/23  8:41 PM   Specimen: Nasopharyngeal Swab; Respiratory  Result Value Ref Range Status   Adenovirus NOT DETECTED NOT DETECTED Final   Coronavirus 229E NOT DETECTED NOT DETECTED Final    Comment: (NOTE) The Coronavirus on the Respiratory Panel, DOES NOT test for the novel  Coronavirus (2019 nCoV)    Coronavirus HKU1 NOT DETECTED NOT DETECTED Final   Coronavirus NL63 NOT DETECTED NOT DETECTED Final   Coronavirus OC43 NOT DETECTED NOT DETECTED Final   Metapneumovirus NOT DETECTED NOT DETECTED Final   Rhinovirus / Enterovirus NOT DETECTED NOT DETECTED Final   Influenza A NOT DETECTED NOT DETECTED Final   Influenza B NOT DETECTED NOT DETECTED Final   Parainfluenza Virus 1 NOT DETECTED NOT DETECTED Final    Parainfluenza Virus 2 NOT DETECTED NOT DETECTED Final   Parainfluenza Virus 3 NOT DETECTED NOT DETECTED Final   Parainfluenza Virus 4 NOT DETECTED NOT DETECTED Final   Respiratory Syncytial Virus NOT DETECTED NOT DETECTED Final   Bordetella pertussis NOT DETECTED NOT DETECTED Final   Bordetella Parapertussis NOT DETECTED NOT DETECTED Final   Chlamydophila pneumoniae NOT DETECTED NOT DETECTED Final   Mycoplasma pneumoniae NOT DETECTED NOT DETECTED Final    Comment: Performed at [facility], [address], [place], [place] [address]  SARS Coronavirus 2 by RT PCR (hospital order, performed in [facility] hospital lab) *cepheid single result test* Anterior Nasal Swab     Status: None   Collection Time: 08/28/23  8:41 PM   Specimen: Anterior Nasal Swab  Result Value Ref Range Status   SARS Coronavirus 2 by RT PCR NEGATIVE NEGATIVE Final    Comment: Performed at [facility], [address], [place], [place] [address]         Radiology Studies: No results found.       Scheduled Meds:  amantadine   100 mg Per Tube BID   aspirin   325 mg Per Tube Daily   clopidogrel   75 mg Per Tube Daily   docusate  50 mg Per Tube BID   feeding supplement (PROSource TF20)  60 mL Per Tube Daily   glycopyrrolate   0.1 mg Intravenous Q8H   memantine   10 mg Per Tube BID   multivitamin with minerals  1 tablet Per Tube Daily   mouth rinse  15 mL Mouth Rinse 4 times per day   scopolamine   1 patch Transdermal Q72H   sennosides  5 mL Per Tube BID   sodium chloride  flush  3 mL Intravenous Q12H   Continuous Infusions:  feeding supplement (OSMOLITE 1.2 CAL) 60 mL/hr at 09/07/23 0506          Audria Leather, MD [facility] 09/07/2023, 8:18 AM

## 2023-09-07 NOTE — Progress Notes (Signed)
 Daily Progress Note   Patient Name: Christopher Murphy       Date: 09/07/2023 DOB: 23-Aug-1945  Age: 78 y.o. MRN#: [id number] Attending Physician: Audria Leather, MD Primary Care Physician: Carollynn Cirri, NP Admit Date: 08/28/2023  Reason for Consultation/Follow-up: Establishing goals of care  Length of Stay: 10  Current Medications: Scheduled Meds:   amantadine   100 mg Per Tube BID   aspirin   325 mg Per Tube Daily   clopidogrel   75 mg Per Tube Daily   docusate  50 mg Per Tube BID   feeding supplement (PROSource TF20)  60 mL Per Tube Daily   glycopyrrolate   0.1 mg Intravenous Q8H   memantine   10 mg Per Tube BID   multivitamin with minerals  1 tablet Per Tube Daily   mouth rinse  15 mL Mouth Rinse 4 times per day   scopolamine   1 patch Transdermal Q72H   sennosides  5 mL Per Tube BID   sodium chloride  flush  3 mL Intravenous Q12H    Continuous Infusions:  feeding supplement (OSMOLITE 1.2 CAL) 60 mL/hr at 09/07/23 0506    PRN Meds: acetaminophen , hydrALAZINE , mouth rinse, polyethylene glycol  Physical Exam Vitals reviewed.  Constitutional:      General: He is not in acute distress.    Appearance: He is ill-appearing.     Comments: Cortrak; no meaningful interaction  HENT:     Mouth/Throat:     Mouth: Mucous membranes are dry.  Cardiovascular:     Rate and Rhythm: Tachycardia present.  Pulmonary:     Effort: Tachypnea present.  Skin:    General: Skin is warm and dry.             Vital Signs: BP 138/82 (BP Location: Right Arm)   Pulse (!) 118   Temp 98.9 F (37.2 C) (Axillary)   Resp (!) 22   Ht 5\' 10"  (1.778 m)   Wt 71.2 kg   SpO2 98%   BMI 22.52 kg/m  SpO2: SpO2: 98 % O2 Device: O2 Device: Room Air O2 Flow Rate: O2 Flow Rate (L/min): 0  L/min   Palliative Assessment/Data: 30% with feeds      Patient Active Problem List   Diagnosis Date Noted   Protein-calorie malnutrition, severe 09/05/2023   DNR (do not resuscitate) 08/28/2023  Dementia without behavioral disturbance (HCC) 08/28/2023   Encephalopathy 08/28/2023   Decreased level of consciousness 08/28/2023   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   CVA (cerebral vascular accident) (HCC) 08/27/2023   History of CVA (cerebrovascular accident) 03/11/2022   Acute metabolic encephalopathy 03/10/2022   Hemiparesis affecting right side as late effect of cerebrovascular accident (CVA) (HCC) 03/10/2022   GERD (gastroesophageal reflux disease) 07/19/2021   Iron deficiency anemia secondary to inadequate dietary iron intake 01/23/2021   Aortic atherosclerosis (HCC) 01/23/2021   Thoracic aortic aneurysm (HCC) 06/02/2020   Fever    Vascular dementia (HCC) 10/28/2019   Chronic cough 10/19/2019    Palliative Care Assessment & Plan   Patient Profile: 78 y.o. male  admitted on 08/28/2023 with past medical history of vascular dementia, prior CVA with right-sided hemiplegia, HLD, aortic aneurysm, who is transferred from [facility] for altered mental status.    He has been admitted with acute encephalopathy and aphasia started for seizures and remains on IV Keppra  which were performed 5/23 for further evaluation per neurology. He was evaluated with long-term EEG with no seizure activity seen, but appears to have no improvement in mentation.    Patient remains minimally responsive to vigorous stimulation, unable to follow commands.   Assessment: Patient lying in bed in NAD. He remains minimally responsive with no meaningful interactions. No family at bedside.  09:00: Spoke to patient's son. Family will be at bedside after 10 am. Will plan to continue goals of care discussions at that time.  11:15: Met with patient's son at bedside.  He shared that the family has been  considering their options moving forward.  They understand the patient has a poor prognosis.  The patient had shared in the past that he would not want his life maintained artificially.  Over the last decade the patient recovered from several serious illnesses. The family has seen his health and performance status decline over time.  We discussed his reserve has gotten smaller over this time making recovering more difficult.  The family understands this and his prognosis but would like a little more time for outcomes.  If the patient does not have meaningful improvement by Tuesday the family would like to transition him to comfort measures.  We discussed comfort measures and available locations for end-of-life care.  The family is interested in pursuing inpatient hospice in [place].  I explained that acceptance to hospice in any specific location is the final decision of the hospice medical director and bed availability, if applicable.  Patient's son understands.  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  Emotional support provided.  Encouraged the patient's family to reach out to PMT if they have questions or concerns before Tuesday.  PMT will follow-up Tuesday.  Recommendations/Plan: DNR/DNI Allow time for outcomes- If no meaningful improvement by Tuesday then transition to comfort measures with goal of inpatient hospice in [place] PMT will continue to follow- PMT will follow up Tuesday   Code Status:    Code Status Orders  (From admission, onward)           Start     Ordered   08/28/23 1959  Do not attempt resuscitation (DNR)- Limited -Do Not Intubate (DNI)  (Code Status)  Continuous       Question Answer Comment  If pulseless and not breathing No CPR or chest compressions.   In Pre-Arrest Conditions (Patient Is Breathing and Has A Pulse) Do  not intubate. Provide all appropriate non-invasive medical  interventions. Avoid ICU transfer unless indicated or required.   Consent: Discussion documented in EHR or advanced directives reviewed      08/28/23 1958           Extensive chart review has been completed prior to seeing the patient including labs, vital signs, imaging, progress/consult notes, orders, medications, and available advance directive documents.  Care plan was discussed with bedside RN  Time spent: 50 minutes  Thank you for allowing the Palliative Medicine Team to assist in the care of this patient.   Daina Drum, NP  Please contact Palliative Medicine Team phone at [phone] for questions and concerns.

## 2023-09-07 NOTE — Progress Notes (Signed)
 OT Cancellation Note  Patient Details Name: Christopher Murphy MRN: [id number] DOB: 12/05/1945   Cancelled Treatment:    Reason Eval/Treat Not Completed: Fatigue/lethargy limiting ability to participate (per RN pt lethargic, not following commands, pending palliative discussion for GOC. Will follow up for OT evaluation as appropriate/schedule permitting.)  Cheyanna Strick K, OTD, OTR/L SecureChat Preferred Acute Rehab [phone]   Antionette Kirks 09/07/2023, 9:48 AM

## 2023-09-07 NOTE — Progress Notes (Signed)
 PT Cancellation Note  Patient Details Name: Atanacio Melnyk MRN: [id number] DOB: 09-03-1945   Cancelled Treatment:    Reason Eval/Treat Not Completed: Patient not medically ready.  Not following commands.  Palliative being called in.  RN asked to hold. 09/07/2023  Nohemi Batters., PT Acute Rehabilitation Services [phone]  (office)   Durell Gilding Shatara Stanek 09/07/2023, 10:00 AM

## 2023-09-08 DIAGNOSIS — G9340 Encephalopathy, unspecified: Secondary | ICD-10-CM | POA: Diagnosis not present

## 2023-09-08 LAB — CBC WITH DIFFERENTIAL/PLATELET
Abs Immature Granulocytes: 0.05 10*3/uL (ref 0.00–0.07)
Basophils Absolute: 0.1 10*3/uL (ref 0.0–0.1)
Basophils Relative: 1 %
Eosinophils Absolute: 0.7 10*3/uL — ABNORMAL HIGH (ref 0.0–0.5)
Eosinophils Relative: 7 %
HCT: 36.6 % — ABNORMAL LOW (ref 39.0–52.0)
Hemoglobin: 11.9 g/dL — ABNORMAL LOW (ref 13.0–17.0)
Immature Granulocytes: 1 %
Lymphocytes Relative: 19 %
Lymphs Abs: 1.8 10*3/uL (ref 0.7–4.0)
MCH: 26.6 pg (ref 26.0–34.0)
MCHC: 32.5 g/dL (ref 30.0–36.0)
MCV: 81.9 fL (ref 80.0–100.0)
Monocytes Absolute: 0.8 10*3/uL (ref 0.1–1.0)
Monocytes Relative: 8 %
Neutro Abs: 6.3 10*3/uL (ref 1.7–7.7)
Neutrophils Relative %: 64 %
Platelets: 361 10*3/uL (ref 150–400)
RBC: 4.47 MIL/uL (ref 4.22–5.81)
RDW: 14.6 % (ref 11.5–15.5)
WBC: 9.6 10*3/uL (ref 4.0–10.5)
nRBC: 0 % (ref 0.0–0.2)

## 2023-09-08 LAB — GLUCOSE, CAPILLARY
Glucose-Capillary: 106 mg/dL — ABNORMAL HIGH (ref 70–99)
Glucose-Capillary: 121 mg/dL — ABNORMAL HIGH (ref 70–99)
Glucose-Capillary: 124 mg/dL — ABNORMAL HIGH (ref 70–99)
Glucose-Capillary: 134 mg/dL — ABNORMAL HIGH (ref 70–99)

## 2023-09-08 LAB — BASIC METABOLIC PANEL WITH GFR
Anion gap: 9 (ref 5–15)
BUN: 22 mg/dL (ref 8–23)
CO2: 23 mmol/L (ref 22–32)
Calcium: 8.4 mg/dL — ABNORMAL LOW (ref 8.9–10.3)
Chloride: 103 mmol/L (ref 98–111)
Creatinine, Ser: 0.8 mg/dL (ref 0.61–1.24)
GFR, Estimated: >60 mL/min (ref >60)
Glucose, Bld: 126 mg/dL — ABNORMAL HIGH (ref 70–99)
Potassium: 4.2 mmol/L (ref 3.5–5.1)
Sodium: 135 mmol/L (ref 135–145)

## 2023-09-08 LAB — MAGNESIUM: Magnesium: 2.2 mg/dL (ref 1.7–2.4)

## 2023-09-08 NOTE — Progress Notes (Signed)
 PROGRESS NOTE    Christopher Murphy  ZOX:[id number] DOB: 1946/01/11 DOA: 08/28/2023 PCP: Carollynn Cirri, NP   Brief Narrative:  78 years old male with past medical history of vascular dementia, prior CVA with right-sided hemiplegia, hyperlipidemia, aortic aneurysm, was transferred from [facility] for altered mental status.  He has been admitted with acute encephalopathy and aphasia secondary to seizures noted on EEG on 08/29/23.  Neurology following: On antiepileptics as per neurology.  Mental status has not much improved.  Plan is to continue to see if prognosis will improve over the next several days and if he becomes more alert and awake.  Palliative care following for goals of care discussion.  Currently also has cortrak feeding.  Assessment & Plan:   Acute encephalopathy: Persistent  Possible seizures Aphasia and unresponsiveness History of vascular dementia Prior CVA with right-sided hemiplegia Severe malnutrition Hyperlipidemia Deconditioning/debility -Neurology following.  MRI and EEG findings concerning for seizures.  Was initially on Keppra  which was discontinued to minimize sedation by neurology.  Currently on amantadine .  Patient received high-dose IV thiamine .  Vitamin B12 398. - Seizure precautions.  Fall and aspiration precautions. - Mental status has not improved much: Continues to remain unresponsive. Currently has cortrak feeding.  Neurology is recommending hospice/comfort measures - Palliative care following.  Family want to wait till Tuesday to see if patient improves: If not, they will possibly opt for comfort measures - Memantine  resumed on 09/05/2023 as per neurology recommendations.  Donepezil  on hold.  Crestor  on hold.  Right vertebral artery dissection - Neurology recommends DAPT for 90 days followed by Plavix  75 mg daily.  Continue aspirin  and Plavix  via tube.  Fever Possible aspiration pneumonia - Questionable cause.  No signs of infection.  Resolved.  Patient  received Unasyn  for aspiration pneumonia until 09/02/2023.  LP was negative for CNS infection.  Continue to monitor off antibiotics.  Acute metabolic acidosis - Resolved  Hypokalemia - Improved  Hyponatremia - Improved  Anemia of chronic disease Microcytosis -Outpatient follow-up.  Hemoglobin currently stable.  Monitor intermittently no signs of bleeding  Oral secretions - Continue scopolamine  and glycopyrrolate   Aortic aneurysm - Noted.  Outpatient follow-up  Hypophosphatemia - Resolved  DVT prophylaxis: SCDs Code Status: DNR Family Communication: Daughter-in-law at bedside on 09/06/23.  None at bedside today Disposition Plan: Status is: Inpatient Remains inpatient appropriate because: Of severity of illness  Consultants: Neurology/palliative care  Procedures: EEG  Antimicrobials:  Anti-infectives (From admission, onward)    Start     Dose/Rate Route Frequency Ordered Stop   09/04/23 1000  fluconazole  (DIFLUCAN ) tablet 100 mg  Status:  Discontinued        100 mg Per Tube Daily 09/04/23 0208 09/04/23 0209   09/04/23 0300  fluconazole  (DIFLUCAN ) IVPB 200 mg  Status:  Discontinued        200 mg 100 mL/hr over 60 Minutes Intravenous Daily 09/04/23 0209 09/04/23 0224   08/28/23 2145  Ampicillin -Sulbactam (UNASYN ) 3 g in sodium chloride  0.9 % 100 mL IVPB        3 g 200 mL/hr over 30 Minutes Intravenous Every 6 hours 08/28/23 2048 09/02/23 2144        Subjective: Patient seen and examined at bedside.  No fever, vomiting, seizures or agitation reported.   Objective: Vitals:   09/07/23 2357 09/08/23 0500 09/08/23 0530 09/08/23 0815  BP: 107/77   (!) 91/56  Pulse: 94   96  Resp: 16  17 17   Temp: 98.4 F (36.9 C)  98.2  F (36.8 C) 98 F (36.7 C)  TempSrc: Axillary  Oral Oral  SpO2: 97%   96%  Weight:  73.9 kg    Height:        Intake/Output Summary (Last 24 hours) at 09/08/2023 0834 Last data filed at 09/08/2023 0700 Gross per 24 hour  Intake 0 ml  Output 700  ml  Net -700 ml   Filed Weights   09/06/23 0509 09/07/23 0445 09/08/23 0500  Weight: 71.4 kg 71.2 kg 73.9 kg    Examination:  General: Remains on room air.  No distress currently.  Chronically ill and deconditioned looking.  Extremely thinly built ENT/neck: No JVD elevation or palpable thyromegaly noted.  Cortrak present respiratory: Decreased breath sounds at bases bilaterally with some crackles CVS: S1-S2 heard; currently rate controlled Abdominal: Soft, nontender, remains mildly distended; no organomegaly, normal bowel sounds are heard  extremities: Mild lower extremity edema; no cyanosis CNS: Still does not follow any commands.  Right-sided hemiparesis present  lymph: No lymphadenopathy palpable Skin: No obvious ecchymosis/lesions psych: Cannot assess due to mental status musculoskeletal: No obvious joint swelling/deformity   Data Reviewed: I have personally reviewed following labs and imaging studies  CBC: Recent Labs  Lab 09/02/23 0534 09/03/23 0459 09/04/23 0613 09/05/23 0449 09/08/23 0454  WBC 7.9 9.1 10.2 7.6 9.6  NEUTROABS  --   --   --   --  6.3  HGB 11.1* 11.3* 11.7* 11.6* 11.9*  HCT 33.6* 34.3* 34.4* 34.4* 36.6*  MCV 80.8 81.1 79.6* 79.3* 81.9  PLT 238 267 285 280 361   Basic Metabolic Panel: Recent Labs  Lab 09/01/23 1434 09/02/23 0534 09/02/23 0534 09/03/23 0459 09/04/23 0613 09/05/23 0449 09/06/23 0418 09/08/23 0454  NA  --  138   < > 135 135 134* 135 135  K  --  3.2*   < > 3.7 3.2* 3.2* 4.0 4.2  CL  --  109   < > 106 106 105 106 103  CO2  --  21*   < > 20* 21* 23 22 23   GLUCOSE  --  97   < > 111* 110* 115* 121* 126*  BUN  --  13   < > 11 12 14 16 22   CREATININE  --  0.76   < > 0.73 0.66 0.75 0.80 0.80  CALCIUM   --  7.9*   < > 8.1* 8.0* 7.8* 8.0* 8.4*  MG  --  1.9   < > 2.0 2.0 2.2 2.0 2.2  PHOS 2.7 2.5  --  2.5 2.1* 2.7  --   --    < > = values in this interval not displayed.   GFR: Estimated Creatinine Clearance: 79.8 mL/min (by C-G  formula based on SCr of 0.8 mg/dL). Liver Function Tests: Recent Labs  Lab 09/04/23 0613  AST 28  ALT 26  ALKPHOS 71  BILITOT 0.7  PROT 6.1*  ALBUMIN 2.4*   No results for input(s): "LIPASE", "AMYLASE" in the last 168 hours. No results for input(s): "AMMONIA" in the last 168 hours. Coagulation Profile: No results for input(s): "INR", "PROTIME" in the last 168 hours. Cardiac Enzymes: No results for input(s): "CKTOTAL", "CKMB", "CKMBINDEX", "TROPONINI" in the last 168 hours. BNP (last 3 results) No results for input(s): "PROBNP" in the last 8760 hours. HbA1C: No results for input(s): "HGBA1C" in the last 72 hours. CBG: Recent Labs  Lab 09/07/23 1144 09/07/23 1825 09/08/23 0047 09/08/23 0528 09/08/23 0813  GLUCAP 128* 119* 124* 134* 121*  Lipid Profile: No results for input(s): "CHOL", "HDL", "LDLCALC", "TRIG", "CHOLHDL", "LDLDIRECT" in the last 72 hours. Thyroid  Function Tests: No results for input(s): "TSH", "T4TOTAL", "FREET4", "T3FREE", "THYROIDAB" in the last 72 hours. Anemia Panel: No results for input(s): "VITAMINB12", "FOLATE", "FERRITIN", "TIBC", "IRON", "RETICCTPCT" in the last 72 hours. Sepsis Labs: No results for input(s): "PROCALCITON", "LATICACIDVEN" in the last 168 hours.  No results found for this or any previous visit (from the past 240 hours).        Radiology Studies: No results found.       Scheduled Meds:  amantadine   100 mg Per Tube BID   aspirin   325 mg Per Tube Daily   clopidogrel   75 mg Per Tube Daily   docusate  50 mg Per Tube BID   feeding supplement (PROSource TF20)  60 mL Per Tube Daily   glycopyrrolate   0.1 mg Intravenous Q8H   memantine   10 mg Per Tube BID   multivitamin with minerals  1 tablet Per Tube Daily   mouth rinse  15 mL Mouth Rinse 4 times per day   scopolamine   1 patch Transdermal Q72H   sennosides  5 mL Per Tube BID   sodium chloride  flush  3 mL Intravenous Q12H   Continuous Infusions:  feeding supplement  (OSMOLITE 1.2 CAL) 1,000 mL (09/08/23 0010)          Audria Leather, MD [facility] 09/08/2023, 8:34 AM

## 2023-09-08 NOTE — Plan of Care (Signed)
   Problem: Clinical Measurements: Goal: Ability to maintain clinical measurements within normal limits will improve Outcome: Progressing Goal: Will remain free from infection Outcome: Progressing Goal: Respiratory complications will improve Outcome: Progressing Goal: Cardiovascular complication will be avoided Outcome: Progressing

## 2023-09-08 DEATH — deceased

## 2023-09-09 DIAGNOSIS — G9340 Encephalopathy, unspecified: Secondary | ICD-10-CM | POA: Diagnosis not present

## 2023-09-09 LAB — GLUCOSE, CAPILLARY
Glucose-Capillary: 109 mg/dL — ABNORMAL HIGH (ref 70–99)
Glucose-Capillary: 109 mg/dL — ABNORMAL HIGH (ref 70–99)
Glucose-Capillary: 125 mg/dL — ABNORMAL HIGH (ref 70–99)
Glucose-Capillary: 126 mg/dL — ABNORMAL HIGH (ref 70–99)
Glucose-Capillary: 90 mg/dL (ref 70–99)

## 2023-09-09 NOTE — Evaluation (Signed)
 Occupational Therapy Evaluation Patient Details Name: Christopher Murphy MRN: [id number] DOB: Oct 02, 1945 Today's Date: 09/09/2023   History of Present Illness   78 years old male presented to [facility] ED 5/20 for altered mental status and admitted for acute encephalopathy. Transferred to [facility] 5/21 for EEG to evaluate for seizures. Rapidly worsening mentation, ultimately aphasic and unresponsive. PMH: vascular dementia, prior CVA with right-sided hemiplegia, hyperlipidemia, aortic aneurysm,     Clinical Impressions Pt presents with significant decline in function with ADL and ADL mobility. PTA pt lives with wife, son, daughter in law and grandchildren. DIL reports pt was ambulatory with a cane and requires assist with ADLs, and IADLs. Pt currently only follows minimal commands for extremity movement. Deferred bed mobility due to lack of command follow and lethargy, however pt will require total Ax 2 for bed mobility and transfers. Pt tolerated PROM B UEs: hand flex/ext, elbow flex/ext, shoulder FF. Pt with head/neck laterally flexed to L side, not tracking or attending to R side with strong multimodal cues. OT will follow/trial pt as appropriate to maximize level of function and safety     If plan is discharge home, recommend the following:   Two people to help with walking and/or transfers;Two people to help with bathing/dressing/bathroom;Supervision due to cognitive status     Functional Status Assessment   Patient has had a recent decline in their functional status and demonstrates the ability to make significant improvements in function in a reasonable and predictable amount of time.     Equipment Recommendations   Hospital bed;Hoyer lift     Recommendations for Other Services         Precautions/Restrictions   Precautions Precautions: Fall Recall of Precautions/Restrictions: Impaired Restrictions Weight Bearing Restrictions Per Provider Order: No     Mobility Bed  Mobility               General bed mobility comments: deferred due to decreased strength and command follow    Transfers                          Balance                                           ADL either performed or assessed with clinical judgement   ADL                                         General ADL Comments: TOTAL A for all ADLs     Vision Patient Visual Report: Other (comment) (pt with head/neck laterally flexed to L side, not tracking or attending to R side with strong multimodal cues) Additional Comments: unable to proper;y assess due to pt non verbal and minimally following commands     Perception         Praxis         Pertinent Vitals/Pain Pain Assessment Pain Assessment: Faces Faces Pain Scale: No hurt Pain Intervention(s): Monitored during session     Extremity/Trunk Assessment Upper Extremity Assessment Upper Extremity Assessment: Generalized weakness;RUE deficits/detail;LUE deficits/detail RUE Deficits / Details: baseline CVA deficits; pt will squeeze therapist hand with L hand 2/4 trials LUE Deficits / Details: pt will squeeze therpaist hand 2/4 trials   Lower Extremity Assessment Lower  Extremity Assessment: Defer to PT evaluation       Communication Communication Communication: Impaired Factors Affecting Communication: Non - [place] speaking, interpreter not available;Other (comment) (non verbal during session)   Cognition Arousal: Stuporous                                   Following commands: Impaired Following commands impaired: Follows one step commands inconsistently     Cueing  General Comments   Cueing Techniques: Verbal cues;Gestural cues;Tactile cues;Visual cues  VSS on RA, wife and DIL present   Exercises Other Exercises Other Exercises: PROM B UEs: hand flex/ext, elbow flex/ext, shoulder FF   Shoulder Instructions      Home Living Family/patient  expects to be discharged to:: Private residence Living Arrangements: Spouse/significant other;Children;Other (Comment) Available Help at Discharge: Family;Available 24 hours/day Type of Home: House Home Access: Level entry     Home Layout: One level     Bathroom Shower/Tub: Producer, television/film/video: Handicapped height Bathroom Accessibility: Yes   Home Equipment: Shower seat;Grab bars - tub/shower;Wheelchair - manual;BSC/3in1   Additional Comments: DIL provides PLOF and home set up      Prior Functioning/Environment Prior Level of Function : Needs assist  Cognitive Assist : ADLs (cognitive)   ADLs (Cognitive): Set up cues Physical Assist : ADLs (physical)   ADLs (physical): Bathing;Toileting;Dressing Mobility Comments: ambulates with cane ADLs Comments: family assists with toileting after BM, bathing and dressing, used cane for mobility    OT Problem List: Decreased strength;Decreased activity tolerance;Decreased knowledge of use of DME or AE;Decreased safety awareness;Impaired balance (sitting and/or standing)   OT Treatment/Interventions: Self-care/ADL training;DME and/or AE instruction;Therapeutic activities;Balance training;Therapeutic exercise;Patient/family education      OT Goals(Current goals can be found in the care plan section)   Acute Rehab OT Goals Patient Stated Goal: none stated per family OT Goal Formulation: With patient/family Time For Goal Achievement: 09/23/23 Potential to Achieve Goals: Good ADL Goals Pt/caregiver will Perform Home Exercise Program: Both right and left upper extremity;With written HEP provided Additional ADL Goal #1: Pt will follow 2/3 commands for simple grooming tasks Additional ADL Goal #2: Pt will follow 2/3 commands for rolling in bed for staff assisted care   OT Frequency:  Min 1X/week    Co-evaluation PT/OT/SLP Co-Evaluation/Treatment: Yes Reason for Co-Treatment: For patient/therapist safety;To address  functional/ADL transfers;Necessary to address cognition/behavior during functional activity;Complexity of the patient's impairments (multi-system involvement)   OT goals addressed during session: ADL's and self-care;Strengthening/ROM      AM-PAC OT "6 Clicks" Daily Activity     Outcome Measure Help from another person eating meals?: Total Help from another person taking care of personal grooming?: Total Help from another person toileting, which includes using toliet, bedpan, or urinal?: Total Help from another person bathing (including washing, rinsing, drying)?: Total Help from another person to put on and taking off regular upper body clothing?: Total Help from another person to put on and taking off regular lower body clothing?: Total 6 Click Score: 6   End of Session Nurse Communication: Mobility status  Activity Tolerance: Patient limited by lethargy Patient left: in bed;with call bell/phone within reach;with bed alarm set;with family/visitor present  OT Visit Diagnosis: Unsteadiness on feet (R26.81);Other abnormalities of gait and mobility (R26.89);Muscle weakness (generalized) (M62.81)                Time: 9604-5409 OT Time Calculation (min): 22 min Charges:  OT General Charges $OT Visit: 1 Visit OT Evaluation $OT Eval Moderate Complexity: 1 Mod    Alfred Ann 09/09/2023, 3:13 PM

## 2023-09-09 NOTE — Progress Notes (Signed)
 Patients son was in his fathers room at the beginning of the shift. He requested for nurse to let the physician's know patient is moving all extremities and is more active tonight than he has been in days. Son requested that his father have mittens placed on his hands so when he leaves tonight his father will not pull out his feeding tube. Patient has been resting with his eyes closed most of the time throughout the night. Patient isn't following any commands.

## 2023-09-09 NOTE — Progress Notes (Signed)
 PROGRESS NOTE    Christopher Murphy  ZOX:[id number] DOB: 08-17-1945 DOA: 08/28/2023 PCP: Carollynn Cirri, NP   Brief Narrative:  78 years old male with past medical history of vascular dementia, prior CVA with right-sided hemiplegia, hyperlipidemia, aortic aneurysm, was transferred from [facility] for altered mental status.  He has been admitted with acute encephalopathy and aphasia secondary to seizures noted on EEG on 08/29/23.  Neurology following: On antiepileptics as per neurology.  Mental status has not much improved.  Plan is to continue to see if prognosis will improve over the next several days and if he becomes more alert and awake.  Palliative care following for goals of care discussion.  Currently also has cortrak feeding.  Assessment & Plan:   Acute encephalopathy: Persistent  Possible seizures Aphasia and unresponsiveness History of vascular dementia Prior CVA with right-sided hemiplegia Severe malnutrition Hyperlipidemia Deconditioning/debility -Neurology following.  MRI and EEG findings concerning for seizures.  Was initially on Keppra  which was discontinued to minimize sedation by neurology.  Currently on amantadine .  Patient received high-dose IV thiamine .  Vitamin B12 398. - Seizure precautions.  Fall and aspiration precautions. - Mental status has not improved much: Continues to remain unresponsive. Currently has cortrak feeding.  Neurology is recommending hospice/comfort measures - Palliative care following.  Family want to wait till Tuesday to see if patient improves: If not, they will possibly opt for comfort measures - Memantine  resumed on 09/05/2023 as per neurology recommendations.  Donepezil  on hold.  Crestor  on hold.  Right vertebral artery dissection - Neurology recommends DAPT for 90 days followed by Plavix  75 mg daily.  Continue aspirin  and Plavix  via tube.  Fever Possible aspiration pneumonia - Questionable cause.  No signs of infection.  Resolved.  Patient  received Unasyn  for aspiration pneumonia until 09/02/2023.  LP was negative for CNS infection.  Continue to monitor off antibiotics.  Acute metabolic acidosis - Resolved  Hypokalemia - Improved  Hyponatremia - Improved  Anemia of chronic disease Microcytosis -Outpatient follow-up.  Hemoglobin currently stable.  Monitor intermittently no signs of bleeding  Oral secretions - Continue scopolamine  and glycopyrrolate   Aortic aneurysm - Noted.  Outpatient follow-up  Hypophosphatemia - Resolved  DVT prophylaxis: SCDs Code Status: DNR Family Communication: Daughter-in-law at and wife at bedside  disposition Plan: Status is: Inpatient Remains inpatient appropriate because: Of severity of illness  Consultants: Neurology/palliative care  Procedures: EEG  Antimicrobials:  Anti-infectives (From admission, onward)    Start     Dose/Rate Route Frequency Ordered Stop   09/04/23 1000  fluconazole  (DIFLUCAN ) tablet 100 mg  Status:  Discontinued        100 mg Per Tube Daily 09/04/23 0208 09/04/23 0209   09/04/23 0300  fluconazole  (DIFLUCAN ) IVPB 200 mg  Status:  Discontinued        200 mg 100 mL/hr over 60 Minutes Intravenous Daily 09/04/23 0209 09/04/23 0224   08/28/23 2145  Ampicillin -Sulbactam (UNASYN ) 3 g in sodium chloride  0.9 % 100 mL IVPB        3 g 200 mL/hr over 30 Minutes Intravenous Every 6 hours 08/28/23 2048 09/02/23 2144        Subjective: Patient seen and examined at bedside.  No seizure, agitation, vomiting reported.  Daughter-in-law at bedside states that patient was more awake yesterday and followed some commands.   Objective: Vitals:   09/08/23 2244 09/09/23 0356 09/09/23 0500 09/09/23 0720  BP: 104/62 114/66  117/69  Pulse: 91 87  91  Resp: 18 16  16  Temp: 97.9 F (36.6 C) 97.9 F (36.6 C)  97.9 F (36.6 C)  TempSrc: Axillary Axillary  Axillary  SpO2: 97% 98%  98%  Weight:   74.8 kg   Height:        Intake/Output Summary (Last 24 hours) at  09/09/2023 0811 Last data filed at 09/09/2023 0400 Gross per 24 hour  Intake 600 ml  Output 575 ml  Net 25 ml   Filed Weights   09/07/23 0445 09/08/23 0500 09/09/23 0500  Weight: 71.2 kg 73.9 kg 74.8 kg    Examination:  General: In no distress and on room air.  Chronically ill and deconditioned looking.  Extremely thinly built ENT/neck: No obvious neck masses or elevated JVD noted.  Cortrak is present respiratory: Bilateral decreased breath sounds at bases with scattered crackles CVS: Rate mostly controlled; S1 and S2 are heard Abdominal: Soft, nontender, distended mildly; no organomegaly, bowel sounds heard  extremities: No clubbing; trace lower extremity edema present  CNS: Does not follow commands at all.  Right-sided hemiparesis present  lymph: No cervical lymphadenopathy palpable Skin: No obvious rashes/petechiae psych: Unable to review due to mental status  musculoskeletal: No obvious joint tenderness/erythema  Data Reviewed: I have personally reviewed following labs and imaging studies  CBC: Recent Labs  Lab 09/03/23 0459 09/04/23 0613 09/05/23 0449 09/08/23 0454  WBC 9.1 10.2 7.6 9.6  NEUTROABS  --   --   --  6.3  HGB 11.3* 11.7* 11.6* 11.9*  HCT 34.3* 34.4* 34.4* 36.6*  MCV 81.1 79.6* 79.3* 81.9  PLT 267 285 280 361   Basic Metabolic Panel: Recent Labs  Lab 09/03/23 0459 09/04/23 0613 09/05/23 0449 09/06/23 0418 09/08/23 0454  NA 135 135 134* 135 135  K 3.7 3.2* 3.2* 4.0 4.2  CL 106 106 105 106 103  CO2 20* 21* 23 22 23   GLUCOSE 111* 110* 115* 121* 126*  BUN 11 12 14 16 22   CREATININE 0.73 0.66 0.75 0.80 0.80  CALCIUM  8.1* 8.0* 7.8* 8.0* 8.4*  MG 2.0 2.0 2.2 2.0 2.2  PHOS 2.5 2.1* 2.7  --   --    GFR: Estimated Creatinine Clearance: 79.8 mL/min (by C-G formula based on SCr of 0.8 mg/dL). Liver Function Tests: Recent Labs  Lab 09/04/23 0613  AST 28  ALT 26  ALKPHOS 71  BILITOT 0.7  PROT 6.1*  ALBUMIN 2.4*   No results for input(s):  "LIPASE", "AMYLASE" in the last 168 hours. No results for input(s): "AMMONIA" in the last 168 hours. Coagulation Profile: No results for input(s): "INR", "PROTIME" in the last 168 hours. Cardiac Enzymes: No results for input(s): "CKTOTAL", "CKMB", "CKMBINDEX", "TROPONINI" in the last 168 hours. BNP (last 3 results) No results for input(s): "PROBNP" in the last 8760 hours. HbA1C: No results for input(s): "HGBA1C" in the last 72 hours. CBG: Recent Labs  Lab 09/08/23 0528 09/08/23 0813 09/08/23 1205 09/09/23 0010 09/09/23 0507  GLUCAP 134* 121* 106* 126* 125*   Lipid Profile: No results for input(s): "CHOL", "HDL", "LDLCALC", "TRIG", "CHOLHDL", "LDLDIRECT" in the last 72 hours. Thyroid  Function Tests: No results for input(s): "TSH", "T4TOTAL", "FREET4", "T3FREE", "THYROIDAB" in the last 72 hours. Anemia Panel: No results for input(s): "VITAMINB12", "FOLATE", "FERRITIN", "TIBC", "IRON", "RETICCTPCT" in the last 72 hours. Sepsis Labs: No results for input(s): "PROCALCITON", "LATICACIDVEN" in the last 168 hours.  No results found for this or any previous visit (from the past 240 hours).        Radiology Studies: No  results found.       Scheduled Meds:  amantadine   100 mg Per Tube BID   aspirin   325 mg Per Tube Daily   clopidogrel   75 mg Per Tube Daily   docusate  50 mg Per Tube BID   feeding supplement (PROSource TF20)  60 mL Per Tube Daily   glycopyrrolate   0.1 mg Intravenous Q8H   memantine   10 mg Per Tube BID   multivitamin with minerals  1 tablet Per Tube Daily   mouth rinse  15 mL Mouth Rinse 4 times per day   scopolamine   1 patch Transdermal Q72H   sennosides  5 mL Per Tube BID   sodium chloride  flush  3 mL Intravenous Q12H   Continuous Infusions:  feeding supplement (OSMOLITE 1.2 CAL) 1,000 mL (09/08/23 0010)          Audria Leather, MD [facility] 09/09/2023, 8:11 AM

## 2023-09-09 NOTE — Evaluation (Signed)
 Physical Therapy Evaluation Patient Details Name: Christopher Murphy MRN: [id number] DOB: 1945/06/19 Today's Date: 09/09/2023  History of Present Illness  78 years old male presented to [facility] ED 5/20 for altered mental status and admitted for acute encephalopathy. Transferred to [facility] 5/21 for EEG to evaluate for seizures. Rapidly worsening mentation, ultimately aphasic and unresponsive. PMH: vascular dementia, prior CVA with right-sided hemiplegia, hyperlipidemia, aortic aneurysm,  Clinical Impression  PTA pt living with wife, son, daughter in law and grandchildren. DIL reports pt was ambulatory with a cane and requires assist with ADLs, and iADLs. Pt currently only follows minimal commands for extremity movement. Deferred bed mobility due to lack of command follow and lethargy, however pt will require total Ax2 for bed mobility and transfers. PT will continue to work with pt as his cognition clears on Mobility and balance.          If plan is discharge home, recommend the following: Two people to help with walking and/or transfers;Two people to help with bathing/dressing/bathroom;Assistance with feeding;Assistance with cooking/housework;Direct supervision/assist for medications management;Assist for transportation;Supervision due to cognitive status;Direct supervision/assist for financial management;Help with stairs or ramp for entrance   Can travel by private vehicle    No    Equipment Recommendations Hospital bed;Hoyer lift     Functional Status Assessment Patient has had a recent decline in their functional status and/or demonstrates limited ability to make significant improvements in function in a reasonable and predictable amount of time     Precautions / Restrictions Precautions Precautions: Fall Recall of Precautions/Restrictions: Impaired Restrictions Weight Bearing Restrictions Per Provider Order: No      Mobility  Bed Mobility               General bed mobility  comments: deferred due to decreased strength and command follow           Pertinent Vitals/Pain Pain Assessment Pain Assessment: Faces Faces Pain Scale: No hurt Pain Intervention(s): Monitored during session    Home Living Family/patient expects to be discharged to:: Private residence Living Arrangements: Spouse/significant other;Children;Other (Comment) (grandkids) Available Help at Discharge: Family;Available 24 hours/day Type of Home: House Home Access: Level entry       Home Layout: One level Home Equipment: Shower seat;Grab bars - tub/shower;Wheelchair - manual;BSC/3in1 Additional Comments: DIL provides PLOF and home set up    Prior Function Prior Level of Function : Needs assist  Cognitive Assist : ADLs (cognitive)   ADLs (Cognitive): Set up cues Physical Assist : ADLs (physical)   ADLs (physical): Bathing;Toileting Mobility Comments: ambulates with cane ADLs Comments: family assists with Bowel Movements, batthing and dressing     Extremity/Trunk Assessment   Upper Extremity Assessment Upper Extremity Assessment: Defer to OT evaluation    Lower Extremity Assessment Lower Extremity Assessment: Difficult to assess due to impaired cognition;RLE deficits/detail;LLE deficits/detail RLE Deficits / Details: no AROM, lacks neutral dorsiflexion, LLE Deficits / Details: LE PROM WFL, trace ankle dorsiflexion and plantar flexion to command       Communication   Communication Communication: Impaired Factors Affecting Communication: Non - English speaking, interpreter not available;Other (comment) (no verbalization during session)    Cognition Arousal: Stuporous                               Following commands: Impaired Following commands impaired: Follows one step commands inconsistently     Cueing Cueing Techniques: Verbal cues, Gestural cues, Tactile cues, Visual cues (given by  DIL)     General Comments General comments (skin integrity,  edema, etc.): VSS on RA, wife and daughter in law present throughout session        Assessment/Plan    PT Assessment Patient needs continued PT services  PT Problem List Decreased strength;Decreased range of motion;Decreased activity tolerance;Decreased balance;Decreased mobility;Decreased coordination;Decreased cognition;Decreased safety awareness       PT Treatment Interventions Functional mobility training;Balance training;Cognitive remediation;Patient/family education    PT Goals (Current goals can be found in the Care Plan section)  Acute Rehab PT Goals PT Goal Formulation: Patient unable to participate in goal setting Time For Goal Achievement: 09/23/23 Potential to Achieve Goals: Poor    Frequency Min 1X/week     Co-evaluation PT/OT/SLP Co-Evaluation/Treatment: Yes Reason for Co-Treatment: For patient/therapist safety;To address functional/ADL transfers;Necessary to address cognition/behavior during functional activity;Complexity of the patient's impairments (multi-system involvement) PT goals addressed during session: Strengthening/ROM         AM-PAC PT "6 Clicks" Mobility  Outcome Measure Help needed turning from your back to your side while in a flat bed without using bedrails?: Total Help needed moving from lying on your back to sitting on the side of a flat bed without using bedrails?: Total Help needed moving to and from a bed to a chair (including a wheelchair)?: Total Help needed standing up from a chair using your arms (e.g., wheelchair or bedside chair)?: Total Help needed to walk in hospital room?: Total Help needed climbing 3-5 steps with a railing? : Total 6 Click Score: 6    End of Session   Activity Tolerance: Patient limited by lethargy;Other (comment) (limited by cognition) Patient left: in bed;with call bell/phone within reach;with bed alarm set;with family/visitor present;Other (comment) (placed in chair position) Nurse Communication: Mobility  status PT Visit Diagnosis: Muscle weakness (generalized) (M62.81);Adult, failure to thrive (R62.7);Other symptoms and signs involving the nervous system (R29.898) Hemiplegia - Right/Left: Right Hemiplegia - dominant/non-dominant: Dominant Hemiplegia - caused by: Cerebral infarction    Time: 1031-1053 PT Time Calculation (min) (ACUTE ONLY): 22 min   Charges:   PT Evaluation $PT Eval Moderate Complexity: 1 Mod   PT General Charges $$ ACUTE PT VISIT: 1 Visit         Cougar Imel B. Jewel Mortimer PT, DPT Acute Rehabilitation Services Please use secure chat or  Call Office [phone]   Verlie Glisson [facility] 09/09/2023, 11:23 AM

## 2023-09-10 DIAGNOSIS — G9340 Encephalopathy, unspecified: Secondary | ICD-10-CM | POA: Diagnosis not present

## 2023-09-10 DIAGNOSIS — F03B0 Unspecified dementia, moderate, without behavioral disturbance, psychotic disturbance, mood disturbance, and anxiety: Secondary | ICD-10-CM

## 2023-09-10 LAB — GLUCOSE, CAPILLARY
Glucose-Capillary: 116 mg/dL — ABNORMAL HIGH (ref 70–99)
Glucose-Capillary: 117 mg/dL — ABNORMAL HIGH (ref 70–99)
Glucose-Capillary: 118 mg/dL — ABNORMAL HIGH (ref 70–99)
Glucose-Capillary: 129 mg/dL — ABNORMAL HIGH (ref 70–99)

## 2023-09-10 NOTE — TOC Initial Note (Signed)
 Transition of Care [facility]) - Initial/Assessment Note    Patient Details  Name: Christopher Murphy MRN: [id number] Date of Birth: [birth year]  Transition [facility]) CM/SW Contact:    Cosimo Diones, RN Phone Number: 09/10/2023, 4:55 PM  Clinical Narrative: Patient transferred from [facility]W. Patient presented for encephalopathy. Case Manager spoke with daughter-in-law regarding disposition needs. PTA she states patient was from home with family support. Patient has DME cane, rolling walker, wheelchair, hospital bed an hoyer lift. Daughter in law  is asking for SLP consult- MD is aware and it has been placed in EPIC. Daughter in law is also asking if the patient can go to SNF once stable for d/c. Case Manager did make her aware that we have to have SNF recommendations to fax out to SNF and to make sure the patient can endure SNF with rehab. Palliative is following the patient for GOC. Case Manager will continue to follow for additional transition of care needs.                    Expected Discharge Plan:  (TBD) Barriers to Discharge: Continued Medical Work up   Patient Goals and CMS Choice Patient states their goals for this hospitalization and ongoing recovery are:: daughter in law is asking for SNF          Expected Discharge Plan and Services In-house Referral: Clinical Social Work Discharge Planning Services: CM Consult   Living arrangements for the past 2 months: Single Family Home                                      Prior Living Arrangements/Services Living arrangements for the past 2 months: Single Family Home Lives with:: Adult Children Patient language and need for interpreter reviewed:: Yes (Spoke with daughter-in law telephonically.) Do you feel safe going back to the place where you live?: Yes      Need for Family Participation in Patient Care: Yes (Comment) Care giver support system in place?: Yes (comment) Current home services: DME (rolling  walker, cane, wheelchair bedside commode and hospital bed.) Criminal Activity/Legal Involvement Pertinent to Current Situation/Hospitalization: No - Comment as needed  Activities of Daily Living   ADL Screening (condition at time of admission) Independently performs ADLs?: No Does the patient have a NEW difficulty with bathing/dressing/toileting/self-feeding that is expected to last >3 days?: Yes (Initiates electronic notice to provider for possible OT consult) Does the patient have a NEW difficulty with getting in/out of bed, walking, or climbing stairs that is expected to last >3 days?: Yes (Initiates electronic notice to provider for possible PT consult) Does the patient have a NEW difficulty with communication that is expected to last >3 days?: Yes (Initiates electronic notice to provider for possible SLP consult) Is the patient deaf or have difficulty hearing?: Yes Does the patient have difficulty seeing, even when wearing glasses/contacts?: Yes Does the patient have difficulty concentrating, remembering, or making decisions?: Yes  Permission Sought/Granted Permission sought to share information with : Family Supports, Case Manager                Emotional Assessment Appearance:: Appears stated age Attitude/Demeanor/Rapport: Unable to Assess Affect (typically observed): Unable to Assess   Alcohol  / Substance Use: Not Applicable Psych Involvement: No (comment)  Admission diagnosis:  Altered mental status, unspecified [R41.82] Encephalopathy [G93.40] Patient Active Problem List   Diagnosis Date Noted  Protein-calorie malnutrition, severe 09/05/2023   DNR (do not resuscitate) 08/28/2023   Dementia without behavioral disturbance (HCC) 08/28/2023   Encephalopathy 08/28/2023   Decreased level of consciousness 08/28/2023   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   CVA (cerebral vascular accident) (HCC) 08/27/2023   History of CVA (cerebrovascular accident) 03/11/2022    Acute metabolic encephalopathy 03/10/2022   Hemiparesis affecting right side as late effect of cerebrovascular accident (CVA) (HCC) 03/10/2022   GERD (gastroesophageal reflux disease) 07/19/2021   Iron deficiency anemia secondary to inadequate dietary iron intake 01/23/2021   Aortic atherosclerosis (HCC) 01/23/2021   Thoracic aortic aneurysm (HCC) 06/02/2020   Fever    Vascular dementia (HCC) 10/28/2019   Chronic cough 10/19/2019   PCP:  Carollynn Cirri, NP Pharmacy:   [phone] - [place], [place] - [address] [place] [address] Phone: [phone] Fax: [phone]     Social Drivers of Health (SDOH) Social History: SDOH Screenings   Food Insecurity: No Food Insecurity (08/29/2023)  Housing: Low Risk  (08/29/2023)  Transportation Needs: No Transportation Needs (08/29/2023)  Utilities: Not At Risk (08/29/2023)  Depression (PHQ2-9): Low Risk  (07/15/2023)  Financial Resource Strain: Low Risk  (06/26/2023)  Physical Activity: Insufficiently Active (06/26/2023)  Social Connections: Unknown (08/29/2023)  Stress: No Stress Concern Present (06/26/2023)  Tobacco Use: Low Risk  (09/05/2023)   SDOH Interventions:     Readmission Risk Interventions     No data to display

## 2023-09-10 NOTE — Progress Notes (Signed)
 PROGRESS NOTE    Christopher Murphy  UJW:[id number] DOB: 05-27-1945 DOA: 08/28/2023 PCP: Carollynn Cirri, NP   Brief Narrative:  78 years old male with past medical history of vascular dementia, prior CVA with right-sided hemiplegia, hyperlipidemia, aortic aneurysm, was transferred from [facility] for altered mental status.  He has been admitted with acute encephalopathy and aphasia secondary to seizures noted on EEG on 08/29/23.  Neurology following: On antiepileptics as per neurology.  Mental status has not much improved.  Plan is to continue to see if prognosis will improve over the next several days and if he becomes more alert and awake.  Palliative care following for goals of care discussion.  Currently also has cortrak feeding.  Assessment & Plan:   Acute encephalopathy: Persistent  Possible seizures Aphasia and unresponsiveness History of vascular dementia Prior CVA with right-sided hemiplegia Severe malnutrition Hyperlipidemia Deconditioning/debility -Neurology following intermittently.  MRI and EEG findings concerning for seizures.  Was initially on Keppra  which was discontinued to minimize sedation by neurology.  Currently on amantadine .  Patient received high-dose IV thiamine .  Vitamin B12 398. - Seizure precautions.  Fall and aspiration precautions. - Mental status has not improved much: Continues to remain unresponsive. Currently has cortrak feeding.  Neurology is recommending hospice/comfort measures - Palliative care following.  - Memantine  resumed on 09/05/2023 as per neurology recommendations.  Donepezil  on hold.  Crestor  on hold.  Right vertebral artery dissection - Neurology recommends DAPT for 90 days followed by Plavix  75 mg daily.  Continue aspirin  and Plavix  via tube.  Fever Possible aspiration pneumonia - Questionable cause.  No signs of infection.  Resolved.  Patient received Unasyn  for aspiration pneumonia until 09/02/2023.  LP was negative for CNS infection.   Continue to monitor off antibiotics.  Acute metabolic acidosis - Resolved  Hypokalemia - Improved  Hyponatremia - Improved  Anemia of chronic disease Microcytosis -Outpatient follow-up.  Hemoglobin currently stable.  Monitor intermittently no signs of bleeding  Oral secretions - Continue scopolamine  and glycopyrrolate   Aortic aneurysm - Noted.  Outpatient follow-up  Hypophosphatemia - Resolved  DVT prophylaxis: SCDs Code Status: DNR Family Communication: Daughter-in-law at and wife at bedside on 09/09/2023.  None at bedside today. disposition Plan: Status is: Inpatient Remains inpatient appropriate because: Of severity of illness  Consultants: Neurology/palliative care  Procedures: EEG  Antimicrobials:  Anti-infectives (From admission, onward)    Start     Dose/Rate Route Frequency Ordered Stop   09/04/23 1000  fluconazole  (DIFLUCAN ) tablet 100 mg  Status:  Discontinued        100 mg Per Tube Daily 09/04/23 0208 09/04/23 0209   09/04/23 0300  fluconazole  (DIFLUCAN ) IVPB 200 mg  Status:  Discontinued        200 mg 100 mL/hr over 60 Minutes Intravenous Daily 09/04/23 0209 09/04/23 0224   08/28/23 2145  Ampicillin -Sulbactam (UNASYN ) 3 g in sodium chloride  0.9 % 100 mL IVPB        3 g 200 mL/hr over 30 Minutes Intravenous Every 6 hours 08/28/23 2048 09/02/23 2144        Subjective: Patient seen and examined at bedside.  No agitation, seizure or vomiting reported.   Objective: Vitals:   09/09/23 1941 09/09/23 2356 09/10/23 0346 09/10/23 0730  BP: 139/77 115/77 119/73 122/77  Pulse: 91 99 80 92  Resp: 16 18 16    Temp: 98.3 F (36.8 C) 98.3 F (36.8 C) (!) 97.4 F (36.3 C) 97.7 F (36.5 C)  TempSrc: Axillary Axillary Axillary Axillary  SpO2: 97% 98% 99% 99%  Weight:   71.7 kg   Height:        Intake/Output Summary (Last 24 hours) at 09/10/2023 0826 Last data filed at 09/10/2023 0349 Gross per 24 hour  Intake --  Output 950 ml  Net -950 ml   Filed  Weights   09/08/23 0500 09/09/23 0500 09/10/23 0346  Weight: 73.9 kg 74.8 kg 71.7 kg    Examination:  General: Remains on room air and in no distress.  Chronically ill and deconditioned looking.  Extremely thinly built ENT/neck: No obvious JVD elevation or palpable neck masses noted.  Cortrak present respiratory: Decreased breath sounds at bases bilaterally with some crackles CVS: S1-S2 heard; rate currently controlled Abdominal: Soft, nontender, distended slightly; no organomegaly, bowel sounds are normally heard  extremities: Mild lower extremity edema; no cyanosis CNS: Still does not follow any commands.  Right-sided hemiparesis present  lymph: No palpable cervical lymphadenopathy noted  skin: No obvious ecchymosis or rashes psych: Still can not be assessed because of mental status  musculoskeletal: No obvious joint swelling/deformity  Data Reviewed: I have personally reviewed following labs and imaging studies  CBC: Recent Labs  Lab 09/04/23 0613 09/05/23 0449 09/08/23 0454  WBC 10.2 7.6 9.6  NEUTROABS  --   --  6.3  HGB 11.7* 11.6* 11.9*  HCT 34.4* 34.4* 36.6*  MCV 79.6* 79.3* 81.9  PLT 285 280 361   Basic Metabolic Panel: Recent Labs  Lab 09/04/23 0613 09/05/23 0449 09/06/23 0418 09/08/23 0454  NA 135 134* 135 135  K 3.2* 3.2* 4.0 4.2  CL 106 105 106 103  CO2 21* 23 22 23   GLUCOSE 110* 115* 121* 126*  BUN 12 14 16 22   CREATININE 0.66 0.75 0.80 0.80  CALCIUM  8.0* 7.8* 8.0* 8.4*  MG 2.0 2.2 2.0 2.2  PHOS 2.1* 2.7  --   --    GFR: Estimated Creatinine Clearance: 78.4 mL/min (by C-G formula based on SCr of 0.8 mg/dL). Liver Function Tests: Recent Labs  Lab 09/04/23 0613  AST 28  ALT 26  ALKPHOS 71  BILITOT 0.7  PROT 6.1*  ALBUMIN 2.4*   No results for input(s): "LIPASE", "AMYLASE" in the last 168 hours. No results for input(s): "AMMONIA" in the last 168 hours. Coagulation Profile: No results for input(s): "INR", "PROTIME" in the last 168  hours. Cardiac Enzymes: No results for input(s): "CKTOTAL", "CKMB", "CKMBINDEX", "TROPONINI" in the last 168 hours. BNP (last 3 results) No results for input(s): "PROBNP" in the last 8760 hours. HbA1C: No results for input(s): "HGBA1C" in the last 72 hours. CBG: Recent Labs  Lab 09/09/23 1213 09/09/23 1806 09/10/23 0050 09/10/23 0059 09/10/23 0622  GLUCAP 109* 109* 118* 117* 116*   Lipid Profile: No results for input(s): "CHOL", "HDL", "LDLCALC", "TRIG", "CHOLHDL", "LDLDIRECT" in the last 72 hours. Thyroid  Function Tests: No results for input(s): "TSH", "T4TOTAL", "FREET4", "T3FREE", "THYROIDAB" in the last 72 hours. Anemia Panel: No results for input(s): "VITAMINB12", "FOLATE", "FERRITIN", "TIBC", "IRON", "RETICCTPCT" in the last 72 hours. Sepsis Labs: No results for input(s): "PROCALCITON", "LATICACIDVEN" in the last 168 hours.  No results found for this or any previous visit (from the past 240 hours).        Radiology Studies: No results found.       Scheduled Meds:  amantadine   100 mg Per Tube BID   aspirin   325 mg Per Tube Daily   clopidogrel   75 mg Per Tube Daily   docusate  50 mg  Per Tube BID   feeding supplement (PROSource TF20)  60 mL Per Tube Daily   glycopyrrolate   0.1 mg Intravenous Q8H   memantine   10 mg Per Tube BID   multivitamin with minerals  1 tablet Per Tube Daily   mouth rinse  15 mL Mouth Rinse 4 times per day   scopolamine   1 patch Transdermal Q72H   sennosides  5 mL Per Tube BID   sodium chloride  flush  3 mL Intravenous Q12H   Continuous Infusions:  feeding supplement (OSMOLITE 1.2 CAL) 1,000 mL (09/10/23 0742)          Audria Leather, MD [facility] 09/10/2023, 8:26 AM

## 2023-09-10 NOTE — Progress Notes (Signed)
 Patient ID: Christopher Murphy, male   DOB: 09-05-1945, 79 y.o.   MRN: [id number]    Progress Note from the Palliative Medicine Team at [facility]   Patient Name: Christopher Murphy        Date: 09/10/2023 DOB: 1945/12/23  Age: 78 y.o. MRN#: [id number] Attending Physician: Audria Leather, MD Primary Care Physician: Carollynn Cirri, NP Admit Date: 08/28/2023   Reason for Consultation/Follow-up   Establishing Goals of Care   HPI/ Brief Hospital Review 78 y.o. male   admitted on 08/28/2023 with past medical history of  vascular dementia, prior CVA with right-sided hemiplegia, HLD, aortic aneurysm, who is transferred from [facility] for altered mental status.     He has been admitted with acute encephalopathy and aphasia started for seizures and remains on IV Keppra  which were performed 5/23 for further evaluation per neurology.     He was evaluated with long-term EEG with no seizure activity seen, but appears to have no improvement in mentation.     Patient remains minimally responsive, unable to follow commands. Currently speech therapy is working with patient, swallow is weak and he is high risk for decompensation.   Family face treatment option decisions, advanced directive decisions and anticipatory care needs.    Subjective  Extensive chart review has been completed prior to meeting with patient/family  including labs, vital signs, imaging, progress/consult notes, orders, medications and available advance directive documents.    This NP assessed patient at the bedside as a follow up for palliative medicine needs and emotional support.  DIL at bedside   Detailed education offered on current medical situation and patient's continued altered mental status, inability to follow commands, overall failure to thrive.   Detailed education regarding risk and benefits of artifical feeding/PEG tube.. Discussed continued risk of aspiration with or without PEG   Family continues to  struggle with decisions regarding next steps in treatment plan.   Plan is to meet tomorrow at 0900 for family conference.  Family;  son, DIL, wife and daughter in [place] will be present  on call.   Education offered today regarding  the importance of continued conversation with family and their  medical providers regarding overall plan of care and treatment options,  ensuring decisions are within the context of the patients values and GOCs.  Questions and concerns addressed   Discussed with primary team and nursing staff   Time: 35   minutes  Detailed review of medical records ( labs, imaging, vital signs), medically appropriate exam ( MS, skin, cardiac,  resp)   discussed with treatment team, counseling and education to patient, family, staff, documenting clinical information, medication management, coordination of care    [name] NP  Palliative Medicine Team Team Phone # [phone] Pager [phone]

## 2023-09-10 NOTE — Progress Notes (Signed)
 Please contact neurology tomorrow when family arrives to bedside. Page Dr. Greg Leaks [phone] and I will come speak with them.  Greg Leaks, MD [facility]

## 2023-09-10 NOTE — Progress Notes (Signed)
 Nutrition Follow-up  DOCUMENTATION CODES:  Severe malnutrition in context of chronic illness  INTERVENTION:  Continue TF via Cortrak: Osmolite 1.2 at 60 ml/h (1440 ml per day)  Prosource TF20 60 ml daily Provides 1808 kcal, 100 gm protein, 1166 ml free water daily MVI with minerals daily  NUTRITION DIAGNOSIS:  Severe Malnutrition related to chronic illness (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 - remains applicable  GOAL:  Patient will meet greater than or equal to 90% of their needs - goal met via TF  MONITOR:  Diet advancement, TF tolerance, I & O's, Labs, Weight trends  REASON FOR ASSESSMENT:  Consult Enteral/tube feeding initiation and management  ASSESSMENT:  Pt with PMH of vascular dementia, prior CVA with right-sided hemiplegia, HLD, aortic aneurysm admitted with acute encephalopathy.  Mental status remains unchanged. Though notes reflect pt was more awake and followed some commands on 6/1.  Per GOC discussion with PMT 5/31, family wants to allow time for outcomes and consider comfort care in the coming days.   Checked in with pt at bedside.  Pt's family member present at time of visit. He denies any current nutrition related concerns. Pt continues to tolerate TF well without n/v/d.  Admit weight: 67.8 kg Current weight: 71.7 kg  Medications: colace BID, MVI, senna BID  Labs: reviewed  Diet Order:   Diet Order             Diet NPO time specified  Diet effective now                   EDUCATION NEEDS:   Not appropriate for education at this time  Skin:  Skin Assessment: Reviewed RN Assessment  Last BM:  6/2  Height:   Ht Readings from Last 1 Encounters:  09/05/23 5\' 10"  (1.778 m)    Weight:   Wt Readings from Last 1 Encounters:  09/10/23 71.7 kg   BMI:  Body mass index is 22.67 kg/m.  Estimated Nutritional Needs:   Kcal:  1700-1900  Protein:  85-100  grams  Fluid:  > 1.7 L/day  Rocklin Chute, RDN, LDN Clinical Nutrition See AMiON for contact information.

## 2023-09-11 DIAGNOSIS — G9340 Encephalopathy, unspecified: Secondary | ICD-10-CM | POA: Diagnosis not present

## 2023-09-11 DIAGNOSIS — R1310 Dysphagia, unspecified: Secondary | ICD-10-CM

## 2023-09-11 LAB — COMPREHENSIVE METABOLIC PANEL WITH GFR
ALT: 50 U/L — ABNORMAL HIGH (ref 0–44)
AST: 32 U/L (ref 15–41)
Albumin: 2.4 g/dL — ABNORMAL LOW (ref 3.5–5.0)
Alkaline Phosphatase: 65 U/L (ref 38–126)
Anion gap: 9 (ref 5–15)
BUN: 22 mg/dL (ref 8–23)
CO2: 24 mmol/L (ref 22–32)
Calcium: 8.6 mg/dL — ABNORMAL LOW (ref 8.9–10.3)
Chloride: 102 mmol/L (ref 98–111)
Creatinine, Ser: 0.83 mg/dL (ref 0.61–1.24)
GFR, Estimated: >60 mL/min (ref >60)
Glucose, Bld: 126 mg/dL — ABNORMAL HIGH (ref 70–99)
Potassium: 4.1 mmol/L (ref 3.5–5.1)
Sodium: 135 mmol/L (ref 135–145)
Total Bilirubin: 0.4 mg/dL (ref 0.0–1.2)
Total Protein: 6.7 g/dL (ref 6.5–8.1)

## 2023-09-11 LAB — GLUCOSE, CAPILLARY
Glucose-Capillary: 103 mg/dL — ABNORMAL HIGH (ref 70–99)
Glucose-Capillary: 113 mg/dL — ABNORMAL HIGH (ref 70–99)
Glucose-Capillary: 117 mg/dL — ABNORMAL HIGH (ref 70–99)
Glucose-Capillary: 113 mg/dL — ABNORMAL HIGH (ref 70–99)

## 2023-09-11 LAB — CBC WITH DIFFERENTIAL/PLATELET
Abs Immature Granulocytes: 0.04 10*3/uL (ref 0.00–0.07)
Basophils Absolute: 0.1 10*3/uL (ref 0.0–0.1)
Basophils Relative: 1 %
Eosinophils Absolute: 0.5 10*3/uL (ref 0.0–0.5)
Eosinophils Relative: 5 %
HCT: 35.5 % — ABNORMAL LOW (ref 39.0–52.0)
Hemoglobin: 11.7 g/dL — ABNORMAL LOW (ref 13.0–17.0)
Immature Granulocytes: 0 %
Lymphocytes Relative: 19 %
Lymphs Abs: 1.9 10*3/uL (ref 0.7–4.0)
MCH: 26.8 pg (ref 26.0–34.0)
MCHC: 33 g/dL (ref 30.0–36.0)
MCV: 81.2 fL (ref 80.0–100.0)
Monocytes Absolute: 0.6 10*3/uL (ref 0.1–1.0)
Monocytes Relative: 6 %
Neutro Abs: 7.1 10*3/uL (ref 1.7–7.7)
Neutrophils Relative %: 69 %
Platelets: 410 10*3/uL — ABNORMAL HIGH (ref 150–400)
RBC: 4.37 MIL/uL (ref 4.22–5.81)
RDW: 14.6 % (ref 11.5–15.5)
WBC: 10.2 10*3/uL (ref 4.0–10.5)
nRBC: 0 % (ref 0.0–0.2)

## 2023-09-11 LAB — MAGNESIUM: Magnesium: 2.2 mg/dL (ref 1.7–2.4)

## 2023-09-11 NOTE — Progress Notes (Signed)
 Subjective: NAEO. Slightly more responsive overall per DIL but my examination today is largelely unchanged from last week except he does open his eyes more easily in response to tactile stimulation.  ROS: Unable to obtain due to poor mental status  Examination  Vital signs in last 24 hours: Temp:  [97.6 F (36.4 C)-98.7 F (37.1 C)] 98.2 F (36.8 C) (06/04 1151) Pulse Rate:  [90-98] 95 (06/04 1151) Resp:  [20-22] 20 (06/04 1151) BP: (76-131)/(64-98) 110/98 (06/04 1151) SpO2:  [96 %-97 %] 96 % (06/04 1151) Weight:  [74.4 kg] 74.4 kg (06/04 0520)  General: lying in bed, NAD Neuro: Asleep, does open eyes to repeated tactile stimulation, doesn't follow commands, antigravity strength in all extremity with right hemiparesis  Basic Metabolic Panel: Recent Labs  Lab 09/05/23 0449 09/06/23 0418 09/08/23 0454 09/11/23 0353  NA 134* 135 135 135  K 3.2* 4.0 4.2 4.1  CL 105 106 103 102  CO2 23 22 23 24   GLUCOSE 115* 121* 126* 126*  BUN 14 16 22 22   CREATININE 0.75 0.80 0.80 0.83  CALCIUM  7.8* 8.0* 8.4* 8.6*  MG 2.2 2.0 2.2 2.2  PHOS 2.7  --   --   --     CBC: Recent Labs  Lab 09/05/23 0449 09/08/23 0454 09/11/23 0353  WBC 7.6 9.6 10.2  NEUTROABS  --  6.3 7.1  HGB 11.6* 11.9* 11.7*  HCT 34.4* 36.6* 35.5*  MCV 79.3* 81.9 81.2  PLT 280 361 410*     Coagulation Studies: No results for input(s): "LABPROT", "INR" in the last 72 hours.  Imaging No new brain imaging   ASSESSMENT AND PLAN: 78 year old male with history of dementia, prior stroke with right-sided deficits who presented with speech disturbance and altered mental status since 08/27/2023.   Acute encephalopathy Aphasia - Encephalopathy and aphasia along with MRI and EEG findings concerning for seizures   Recommendations - Continue amantadine  100 mg twice daily for neurostimulation - It is likely that patient has poor neuro cognitive reserve and cognitive impairment which in the setting of repeated  hospitalizations has led to gradual deterioration and progressive encephalopathy.   - Without significant improvement in mental status, I discussed with family about hospice vs peg tube. Family considering waiting for some more time and then make a final decision.  Appreciate palliative care team's help - Continue seizure precautions - prn versed  for clinical seizure lasting more than 2 minutes -Discussed plan with daughter-in-law by phone - Will continue to follow  Greg Leaks, MD [facility] [phone]  If 7pm- 7am, please page neurology on call as listed in AMION.

## 2023-09-11 NOTE — Plan of Care (Signed)
  Problem: Pain Managment: Goal: General experience of comfort will improve and/or be controlled Outcome: Progressing   Problem: Elimination: Goal: Will not experience complications related to bowel motility Outcome: Progressing   Problem: Nutrition: Goal: Adequate nutrition will be maintained Outcome: Progressing   Problem: Activity: Goal: Risk for activity intolerance will decrease Outcome: Progressing

## 2023-09-11 NOTE — Evaluation (Signed)
 Clinical/Bedside Swallow Evaluation Patient Details  Name: Christopher Murphy MRN: [id number] Date of Birth: October 18, 1945  Today's Date: 09/11/2023 Time: SLP Start Time (ACUTE ONLY): 3244 SLP Stop Time (ACUTE ONLY): 0824 SLP Time Calculation (min) (ACUTE ONLY): 13 min  Past Medical History:  Past Medical History:  Diagnosis Date   Blood clots in brain 2009   Stroke [facility])    Past Surgical History:  Past Surgical History:  Procedure Laterality Date   COLONOSCOPY WITH PROPOFOL  N/A 04/30/2019   Procedure: COLONOSCOPY WITH PROPOFOL ;  Surgeon: Luke Salaam, MD;  Location: [facility] ENDOSCOPY;  Service: Gastroenterology;  Laterality: N/A;   INTRAMEDULLARY (IM) NAIL INTERTROCHANTERIC Left 06/19/2022   Procedure: INTRAMEDULLARY (IM) NAIL INTERTROCHANTERIC;  Surgeon: Lorri Rota, MD;  Location: [facility] ORS;  Service: Orthopedics;  Laterality: Left;   PROSTATECTOMY  1980   HPI:  Christopher Murphy is a 78 y.o. male with hx of vascular dementia, prior CVA with right-sided hemiplegia, HLD, aortic aneurysm, who is transferred from [facility] for altered mental status and neurology recommendation for cEEG monitoring.  Upon admission, patient was unarousable at time of interview. History provided by son and daughter in law at bedside. Note that he was in normal state of health until awakening morning of 08/27/23.  Woke around 6:30 AM and noted to have slight more weakness on the right side.  However significantly worsened around 7 AM was unable to stand. Noted to have abnormal speech, per son speech was actually intelligible.  However he was unable to respond to any questioning.  Per daughter-in-law noted to have abnormal eye movement possibly right-sided gaze deviation of the left eye.  Had rapidly worsened on transfer to the ED and per family has been mostly unresponsive since arrival at the hospital.     Otherwise over the past month had had intermittent fever and cough, was prescribed a course of doxycycline  for this.   No other cold-like symptoms, abdominal pain, nausea, vomiting, diarrhea, dysuria, rashes.  No history of fall or head injury, headaches.  At his baseline he is ambulatory with the use of a cane has chronic right-sided hemiplegia.  Mostly dependent on ADLs although able to feed himself and partially close with.  Mostly disoriented although intermittent lucidity.    Assessment / Plan / Recommendation  Clinical Impression   Pt presents with a moderate-severe oral dysphagia and and apparent pharyngeal dysphagia per clinical swallow assessment completed today.   Pt is alert, though does not engage with SLP or follow commands. Repositioned to sit upright; however, he continued to lean towards L side despite support of pillows. No family at bedside at this time. SLP utilized careful hand feeding strategies to offer small sips of water via half tsp. Pt initially turned away and pursed lips. He finally did accept trials when "water" was repeated over and over.  Oral deficits included poor labial seal and strength which resulted in moderate-large anterior loss of liquids on L side. Anticipate poor oral transit. No pharyngeal swallow initiation felt to palpation. Delayed, unproductive, coarse coughing incidents observed x2-3, suspicious of delayed airway violation.   No further PO trials prompted at this time. SLP requested that RN notify SLP if pt's family is at bedside later today.  Plan: Will re-attempt clinical swallow assessment when family is present to support and encourage pt.  SLP Visit Diagnosis: Dysphagia, unspecified (R13.10)    Aspiration Risk  Severe aspiration risk;Risk for inadequate nutrition/hydration    Diet Recommendation NPO    Medication Administration: Via alternative means  Other  Recommendations Oral Care Recommendations: Oral care QID    Recommendations for follow up therapy are one component of a multi-disciplinary discharge planning process, led by the attending physician.   Recommendations may be updated based on patient status, additional functional criteria and insurance authorization.  Follow up Recommendations  (TBD pending goals of care)      Assistance Recommended at Discharge  TBD  Functional Status Assessment Patient has had a recent decline in their functional status and/or demonstrates limited ability to make significant improvements in function in a reasonable and predictable amount of time  Frequency and Duration min 1 x/week  2 weeks (pending goals of care)       Prognosis Prognosis for improved oropharyngeal function: Guarded Barriers to Reach Goals: Cognitive deficits;Severity of deficits      Swallow Study   General Date of Onset: 08/27/23 HPI: Christopher Murphy is a 78 y.o. male with hx of vascular dementia, prior CVA with right-sided hemiplegia, HLD, aortic aneurysm, who is transferred from [facility] for altered mental status and neurology recommendation for cEEG monitoring.  Upon admission, patient was unarousable at time of interview. History provided by son and daughter in law at bedside. Note that he was in normal state of health until awakening morning of 08/27/23.  Woke around 6:30 AM and noted to have slight more weakness on the right side.  However significantly worsened around 7 AM was unable to stand. Noted to have abnormal speech, per son speech was actually intelligible.  However he was unable to respond to any questioning.  Per daughter-in-law noted to have abnormal eye movement possibly right-sided gaze deviation of the left eye.  Had rapidly worsened on transfer to the ED and per family has been mostly unresponsive since arrival at the hospital.     Otherwise over the past month had had intermittent fever and cough, was prescribed a course of doxycycline  for this.  No other cold-like symptoms, abdominal pain, nausea, vomiting, diarrhea, dysuria, rashes.  No history of fall or head injury, headaches.  At his baseline he is ambulatory  with the use of a cane has chronic right-sided hemiplegia.  Mostly dependent on ADLs although able to feed himself and partially close with.  Mostly disoriented although intermittent lucidity. Type of Study: Bedside Swallow Evaluation Previous Swallow Assessment: multiple including a MBSS 05/22/2019 Diet Prior to this Study: NPO Temperature Spikes Noted: No Respiratory Status: Room air History of Recent Intubation: No Behavior/Cognition: Alert;Doesn't follow directions Oral Cavity Assessment: Dried secretions;Dry Oral Care Completed by SLP:  (attempted) Oral Cavity - Dentition:  (unable to view) Self-Feeding Abilities: Total assist Patient Positioning: Upright in bed (poor body posture and trunk strength) Baseline Vocal Quality: Not observed Volitional Cough: Weak;Congested Volitional Swallow: Unable to elicit    Oral/Motor/Sensory Function Overall Oral Motor/Sensory Function:  (unable to assess)   Ice Chips Ice chips: Not tested   Thin Liquid Thin Liquid: Impaired Presentation: Spoon Oral Phase Impairments: Reduced labial seal;Poor awareness of bolus;Reduced lingual movement/coordination Oral Phase Functional Implications: Left lateral sulci pocketing;Left anterior spillage Pharyngeal  Phase Impairments: Unable to trigger swallow;Cough - Delayed    Nectar Thick Nectar Thick Liquid: Not tested   Honey Thick Honey Thick Liquid: Not tested   Puree Puree: Not tested   Solid     Solid: Not tested      Leverette Read 09/11/2023,9:58 AM

## 2023-09-11 NOTE — Plan of Care (Signed)

## 2023-09-11 NOTE — Plan of Care (Signed)
  Problem: SLP Dysphagia Goals Goal: Misc Dysphagia Goal Flowsheets (Taken 09/11/2023 0935) Misc Dysphagia Goal: SLP will follow up to clinically re-assess pt's swallow vs support comfort diet and provide education on careful handfeeding strategies depending on pt's goals of care.

## 2023-09-11 NOTE — Progress Notes (Signed)
 PROGRESS NOTE  Christopher Murphy VHQ:[id number] DOB: 02-11-1946 DOA: 08/28/2023 PCP: Carollynn Cirri, NP   LOS: 14 days   Brief Narrative / Interim history: 78 years old male with past medical history of vascular dementia, prior CVA with right-sided hemiplegia, hyperlipidemia, aortic aneurysm, was transferred from [facility] for altered mental status.  He has been admitted with acute encephalopathy and aphasia secondary to seizures noted on EEG on 08/29/23.  Neurology following: On antiepileptics as per neurology.  Mental status has not much improved.  Palliative care also consulted for ongoing goals of care.  Due to persistent encephalopathy and inability to eat he has been started on tube feeds via core track  Subjective / 24h Interval events: Poorly responsive, intermittently moaning  Assesement and Plan: Principal Problem:   Encephalopathy Active Problems:   Decreased level of consciousness   Vertebral artery dissection (HCC)   Protein-calorie malnutrition, severe  Principal problem Acute metabolic encephalopathy, persistent, seizures -patient remains persistently encephalopathic, aphasic and unresponsive.  He does have a history of vascular dementia as well as prior CVA with right-sided hemiplegia.  Neurology consulted and following, MRI and EEG findings concerning for seizures, initially on Keppra  which was since discontinued to minimize sedation by neurology - Currently he is on amantadine  and remains poorly responsive - Palliative care consulted as well. - Neurology plans to discuss with family regarding prognosis,  Active problems Right vertebral artery dissection-neurology recommends dual antiplatelet therapy for 90 days followed by Plavix   Fever, possible aspiration pneumonia-completed Unasyn  course  Acute metabolic acidosis-resolved  Hypokalemia-improved  Hyponatremia-improved  Anemia of chronic disease-monitor, no signs of bleeding  Aortic  aneurysm-noted  Hypophosphatemia-continue to monitor  Severe malnutrition due to chronic illness-continue tube feeds.  Goals of care-ongoing discussions  Scheduled Meds:  amantadine   100 mg Per Tube BID   aspirin   325 mg Per Tube Daily   clopidogrel   75 mg Per Tube Daily   docusate  50 mg Per Tube BID   feeding supplement (PROSource TF20)  60 mL Per Tube Daily   glycopyrrolate   0.1 mg Intravenous Q8H   memantine   10 mg Per Tube BID   multivitamin with minerals  1 tablet Per Tube Daily   mouth rinse  15 mL Mouth Rinse 4 times per day   scopolamine   1 patch Transdermal Q72H   sennosides  5 mL Per Tube BID   sodium chloride  flush  3 mL Intravenous Q12H   Continuous Infusions:  feeding supplement (OSMOLITE 1.2 CAL) 1,000 mL (09/10/23 0742)   PRN Meds:.acetaminophen , hydrALAZINE , mouth rinse, polyethylene glycol  Current Outpatient Medications  Medication Instructions   acetaminophen  (TYLENOL ) 650 mg, Rectal, Every 4 hours PRN   aspirin  300 mg, Rectal, Daily   cefTRIAXone  2 g in sodium chloride  0.9 % 100 mL 2 g, Intravenous, Every 24 hours   enoxaparin  (LOVENOX ) 40 mg, Subcutaneous, Every 24 hours   Polyethyl Glycol-Propyl Glycol (SYSTANE) 0.4-0.3 % SOLN 1 drop, Both Eyes, 2 times daily   sodium chloride  0.9 % infusion 50 mLs, Intravenous, Continuous    Diet Orders (From admission, onward)     Start     Ordered   08/28/23 1930  Diet NPO time specified  Diet effective now        08/28/23 1931            DVT prophylaxis: Place and maintain sequential compression device Start: 08/28/23 2004   Lab Results  Component Value Date   PLT 410 (H) 09/11/2023  Code Status: Limited: Do not attempt resuscitation (DNR) -DNR-LIMITED -Do Not Intubate/DNI   Family Communication: No family at bedside  Status is: Inpatient Remains inpatient appropriate because: Severity of illness   Level of care: Med-Surg  Consultants:  Neurology Palliative care  Objective: Vitals:    09/10/23 2000 09/10/23 2324 09/11/23 0520 09/11/23 0749  BP: 128/66   131/74  Pulse: 98   90  Resp: (!) 22 20 20 20   Temp: 97.6 F (36.4 C) 98.3 F (36.8 C) 97.8 F (36.6 C) 98.7 F (37.1 C)  TempSrc: Oral Axillary Axillary Axillary  SpO2: 97%   96%  Weight:   74.4 kg   Height:        Intake/Output Summary (Last 24 hours) at 09/11/2023 0843 Last data filed at 09/10/2023 1215 Gross per 24 hour  Intake 10 ml  Output --  Net 10 ml   Wt Readings from Last 3 Encounters:  09/11/23 74.4 kg  08/27/23 68.1 kg  07/15/23 66.7 kg    Examination: Constitutional: Poorly responsive Eyes: no scleral icterus ENMT: Mucous membranes are dry.  Neck: normal, supple Respiratory: clear to auscultation bilaterally, no wheezing, no crackles.  Shallow respirations Cardiovascular: Regular rate and rhythm, no murmurs / rubs / gallops.  Abdomen: non distended, no tenderness. Bowel sounds positive.  Musculoskeletal: no clubbing / cyanosis.   Data Reviewed: I have independently reviewed following labs and imaging studies   CBC Recent Labs  Lab 09/05/23 0449 09/08/23 0454 09/11/23 0353  WBC 7.6 9.6 10.2  HGB 11.6* 11.9* 11.7*  HCT 34.4* 36.6* 35.5*  PLT 280 361 410*  MCV 79.3* 81.9 81.2  MCH 26.7 26.6 26.8  MCHC 33.7 32.5 33.0  RDW 14.2 14.6 14.6  LYMPHSABS  --  1.8 1.9  MONOABS  --  0.8 0.6  EOSABS  --  0.7* 0.5  BASOSABS  --  0.1 0.1    Recent Labs  Lab 09/05/23 0449 09/06/23 0418 09/08/23 0454 09/11/23 0353  NA 134* 135 135 135  K 3.2* 4.0 4.2 4.1  CL 105 106 103 102  CO2 23 22 23 24   GLUCOSE 115* 121* 126* 126*  BUN 14 16 22 22   CREATININE 0.75 0.80 0.80 0.83  CALCIUM  7.8* 8.0* 8.4* 8.6*  AST  --   --   --  32  ALT  --   --   --  50*  ALKPHOS  --   --   --  65  BILITOT  --   --   --  0.4  ALBUMIN  --   --   --  2.4*  MG 2.2 2.0 2.2 2.2    ------------------------------------------------------------------------------------------------------------------ No results  for input(s): "CHOL", "HDL", "LDLCALC", "TRIG", "CHOLHDL", "LDLDIRECT" in the last 72 hours.  Lab Results  Component Value Date   HGBA1C 5.5 06/26/2023   ------------------------------------------------------------------------------------------------------------------ No results for input(s): "TSH", "T4TOTAL", "T3FREE", "THYROIDAB" in the last 72 hours.  Invalid input(s): "FREET3"  Cardiac Enzymes No results for input(s): "CKMB", "TROPONINI", "MYOGLOBIN" in the last 168 hours.  Invalid input(s): "CK" ------------------------------------------------------------------------------------------------------------------ No results found for: "BNP"  CBG: Recent Labs  Lab 09/10/23 0050 09/10/23 0059 09/10/23 0622 09/10/23 2328 09/11/23 0519  GLUCAP 118* 117* 116* 129* 103*    No results found for this or any previous visit (from the past 240 hours).   Radiology Studies: No results found.   Kathlen Para, MD, PhD [facility]  Between 7 am - 7 pm I am available, please contact me via Amion (for  emergencies) or Securechat (non urgent messages)  Between 7 pm - 7 am I am not available, please contact night coverage MD/APP via Amion

## 2023-09-11 NOTE — Progress Notes (Signed)
 Speech Language Pathology Treatment: Dysphagia  Patient Details Name: Christopher Murphy MRN: [id number] DOB: 10/05/1945 Today's Date: 09/11/2023 Time: 4098-1191 SLP Time Calculation (min) (ACUTE ONLY): 28 min  Assessment / Plan / Recommendation Clinical Impression  RN notified SLP that pt's family members were at bedside. SLP present to clinically re-assess swallow function with pt's wife and daughter-in-law present. Christopher Murphy daughter-in-law offered simple directions such as "swallow" in [place]. Pt did seem more receptive to offer of PO trials when presented by daughter-in-law.   With SLP guidance, daughter-in-law presented water by spoon and by straw (used as a pipette).  Pt more readily accepted PO trials, though continued to experience anterior loss of liquids from L side of mouth. There was poor oral transit of applesauce trial x1-2 via half tsp. A moderate amount of applesauce was suctioned from oral cavity. SLP appreciated pharyngeal swallow initiation x2 instances with sips of water only. Otherwise, pharyngeal swallow initiation was absent. He continued to exhibit delayed, coarse coughing following intake of thin liquids, concerning for aspiration event.   SLP provided education on pt's aspiration risk related to NPO status, tube feeds, or PO intake. Ultimately we can not eliminate the risk of aspiration.  Further discussed potential adverse effects of tube feeding at end-of-life including digestive discomfort and risk of aspiration if tube feeds are refluxed.   SLP provided education on comfort care support and purpose of food and beverage at the end of life. That is, that any PO intake may be viewed as a quality of life measure vs a nutritional support.  A comfort diet can meet the cultural goals and QoL needs of a patient. Our SLP team is available to support those needs with ongoing education on careful hand feeding strategies and observation on pt's nonverbal cues that may indicate  if he wants or does not want to try food or beverage. SLP highlighted these strategies and cues today and family verbalized good understanding. We discussed that food and beverage of cultural significance may evoke some small recognition and pleasure.    Palliative NP, Adriana Hopping, present during portion of SLP session. She escorted the family to a conference room for further goals of care discussion at end of session.   Plan: SLP will follow up to offer support or clinical re-assessment pending family's goals of care.    HPI HPI: Christopher Murphy is a 78 y.o. male with hx of vascular dementia, prior CVA with right-sided hemiplegia, HLD, aortic aneurysm, who is transferred from [facility] for altered mental status and neurology recommendation for cEEG monitoring.  Upon admission, patient was unarousable at time of interview. History provided by son and daughter in law at bedside. Note that he was in normal state of health until awakening morning of 08/27/23.  Woke around 6:30 AM and noted to have slight more weakness on the right side.  However significantly worsened around 7 AM was unable to stand. Noted to have abnormal speech, per son speech was actually intelligible.  However he was unable to respond to any questioning.  Per daughter-in-law noted to have abnormal eye movement possibly right-sided gaze deviation of the left eye.  Had rapidly worsened on transfer to the ED and per family has been mostly unresponsive since arrival at the hospital.     Otherwise over the past month had had intermittent fever and cough, was prescribed a course of doxycycline  for this.  No other cold-like symptoms, abdominal pain, nausea, vomiting, diarrhea, dysuria, rashes.  No history of fall or  head injury, headaches.  At his baseline he is ambulatory with the use of a cane has chronic right-sided hemiplegia.  Mostly dependent on ADLs although able to feed himself and partially close with.  Mostly disoriented although intermittent  lucidity.      SLP Plan  Continue with current plan of care      Recommendations for follow up therapy are one component of a multi-disciplinary discharge planning process, led by the attending physician.  Recommendations may be updated based on patient status, additional functional criteria and insurance authorization.    Recommendations  Diet recommendations: NPO (pending goals of care- see note for comfort diet recommendations pending decision on hospice care) Medication Administration: Via alternative means Supervision: Full supervision/cueing for compensatory strategies Compensations: Slow rate;Small sips/bites Postural Changes and/or Swallow Maneuvers: Seated upright 90 degrees (careful handfeeding strategies)                  Oral care QID (as pt can tolerate)   Frequent or constant Supervision/Assistance Dysphagia, unspecified (R13.10)     Continue with current plan of care     Christopher Murphy  09/11/2023, 10:15 AM

## 2023-09-11 NOTE — Progress Notes (Signed)
 Patient ID: Christopher Murphy, male   DOB: June 06, 1945, 78 y.o.   MRN: [id number]    Progress Note from the Palliative Medicine Team at [facility]   Patient Name: Christopher Murphy        Date: 09/11/2023 DOB: [birth year]  Age: 78 y.o. MRN#: [id number] Attending Physician: Osborn Blaze, MD Primary Care Physician: Carollynn Cirri, NP Admit Date: 08/28/2023   Reason for Consultation/Follow-up   Establishing Goals of Care   HPI/ Brief Hospital Review 78 y.o. male   admitted on 08/28/2023 with past medical history of  vascular dementia, prior CVA with right-sided hemiplegia, HLD, aortic aneurysm, who is transferred from [facility] for altered mental status.   Admitted with acute encephalopathy and aphasia -neurology consulted     Long-term EEG with no seizure activity, remains on antiepileptics per neurology.      Patient remains minimally responsive, unable to follow commands.  Minimal improvement in mental status.    Currently speech therapy is working with patient, swallow is weak and he is high risk for decompensation.   Family face treatment option decisions, advanced directive decisions and anticipatory care needs.    Subjective  Extensive chart review has been completed prior to meeting with patient/family  including labs, vital signs, imaging, progress/consult notes, orders, medications and available advance directive documents.    This NP assessed patient at the bedside as a follow up for palliative medicine needs and emotional support.  DIL and wife at bedside   We meet in conference room and connected with son and daughter by telephone.   Attempted to elite values and GOC important patient and his family  Detailed education offered on current medical situation and patient's continued altered mental status, inability to follow commands, overall failure to thrive.  Education/discussion on human mortality and limitations of medical interventions to prolong quality  of life when a body fails to thrive.    Detailed education regarding risk and benefits of artifical feeding/PEG tube.. Discussed continued risk of aspiration with or without PEG.  Education offered on hospice benefit; philosophy and eligibility .   We discussed anticipatory care needs considering home, SNF and  IP hospice facility.   Family continues to struggle with decisions regarding next steps in treatment plan.  They are asking for "more time"  Ultimately they remain hopeful for improvement.   Education offered today regarding  the importance of continued conversation with family and their  medical providers regarding overall plan of care and treatment options,  ensuring decisions are within the context of the patients values and GOCs.  Questions and concerns addressed     Emotional support.  PMT will be available for f/u   Discussed with Dr Helene Loader, and nursing staff   Time: 65   minutes  Detailed review of medical records ( labs, imaging, vital signs), medically appropriate exam ( MS, skin, cardiac,  resp)   discussed with treatment team, counseling and education to patient, family, staff, documenting clinical information, medication management, coordination of care    [name] NP  Palliative Medicine Team Team Phone # [phone] Pager [phone]

## 2023-09-12 DIAGNOSIS — Z789 Other specified health status: Secondary | ICD-10-CM

## 2023-09-12 DIAGNOSIS — G40804 Other epilepsy, intractable, without status epilepticus: Secondary | ICD-10-CM

## 2023-09-12 DIAGNOSIS — G9340 Encephalopathy, unspecified: Secondary | ICD-10-CM | POA: Diagnosis not present

## 2023-09-12 LAB — CBC
HCT: 36.1 % — ABNORMAL LOW (ref 39.0–52.0)
Hemoglobin: 11.8 g/dL — ABNORMAL LOW (ref 13.0–17.0)
MCH: 26.4 pg (ref 26.0–34.0)
MCHC: 32.7 g/dL (ref 30.0–36.0)
MCV: 80.8 fL (ref 80.0–100.0)
Platelets: 421 10*3/uL — ABNORMAL HIGH (ref 150–400)
RBC: 4.47 MIL/uL (ref 4.22–5.81)
RDW: 14.6 % (ref 11.5–15.5)
WBC: 8.6 10*3/uL (ref 4.0–10.5)
nRBC: 0 % (ref 0.0–0.2)

## 2023-09-12 LAB — COMPREHENSIVE METABOLIC PANEL WITH GFR
ALT: 43 U/L (ref 0–44)
AST: 27 U/L (ref 15–41)
Albumin: 2.3 g/dL — ABNORMAL LOW (ref 3.5–5.0)
Alkaline Phosphatase: 70 U/L (ref 38–126)
Anion gap: 7 (ref 5–15)
BUN: 22 mg/dL (ref 8–23)
CO2: 25 mmol/L (ref 22–32)
Calcium: 8.7 mg/dL — ABNORMAL LOW (ref 8.9–10.3)
Chloride: 104 mmol/L (ref 98–111)
Creatinine, Ser: 0.78 mg/dL (ref 0.61–1.24)
GFR, Estimated: >60 mL/min (ref >60)
Glucose, Bld: 115 mg/dL — ABNORMAL HIGH (ref 70–99)
Potassium: 4.1 mmol/L (ref 3.5–5.1)
Sodium: 136 mmol/L (ref 135–145)
Total Bilirubin: 0.5 mg/dL (ref 0.0–1.2)
Total Protein: 6.6 g/dL (ref 6.5–8.1)

## 2023-09-12 LAB — GLUCOSE, CAPILLARY
Glucose-Capillary: 110 mg/dL — ABNORMAL HIGH (ref 70–99)
Glucose-Capillary: 115 mg/dL — ABNORMAL HIGH (ref 70–99)
Glucose-Capillary: 126 mg/dL — ABNORMAL HIGH (ref 70–99)
Glucose-Capillary: 126 mg/dL — ABNORMAL HIGH (ref 70–99)

## 2023-09-12 LAB — PHOSPHORUS: Phosphorus: 3.4 mg/dL (ref 2.5–4.6)

## 2023-09-12 LAB — MAGNESIUM: Magnesium: 2.2 mg/dL (ref 1.7–2.4)

## 2023-09-12 NOTE — TOC Progression Note (Signed)
 Transition of Care [facility]) - Progression Note    Patient Details  Name: Christopher Murphy MRN: [id number] Date of Birth: [birth year]  [facility] CM/SW Contact  Carmon Christen, LCSWA Phone Number: 09/12/2023, 2:48 PM  Clinical Narrative:      CSW received message from RN that patients daughter-n-law was wanting to ask some questions regarding SNF benefits. CSW LVM. CSW awaiting call back. CSW will continue to follow.    Expected Discharge Plan:  (TBD) Barriers to Discharge: Continued Medical Work up  Expected Discharge Plan and Services In-house Referral: Clinical Social Work Discharge Planning Services: CM Consult   Living arrangements for the past 2 months: Single Family Home                                       Social Determinants of Health (SDOH) Interventions SDOH Screenings   Food Insecurity: No Food Insecurity (08/29/2023)  Housing: Low Risk  (08/29/2023)  Transportation Needs: No Transportation Needs (08/29/2023)  Utilities: Not At Risk (08/29/2023)  Depression (PHQ2-9): Low Risk  (07/15/2023)  Financial Resource Strain: Low Risk  (06/26/2023)  Physical Activity: Insufficiently Active (06/26/2023)  Social Connections: Unknown (08/29/2023)  Stress: No Stress Concern Present (06/26/2023)  Tobacco Use: Low Risk  (09/05/2023)    Readmission Risk Interventions     No data to display

## 2023-09-12 NOTE — Progress Notes (Signed)
                                                                                                                                                          Daily Progress Note   Patient Name: Christopher Murphy       Date: 09/12/2023 DOB: [birth year]  Age: 78 y.o. MRN#: [id number] Attending Physician: Osborn Blaze, MD Primary Care Physician: Carollynn Cirri, NP Admit Date: 08/28/2023  Reason for Consultation/Follow-up: {Reason for Consult:23484}  Subjective: ***  Length of Stay: 15   Physical Exam          Vital Signs: BP 122/68 (BP Location: Right Arm)   Pulse 91   Temp 97.8 F (36.6 C) (Axillary)   Resp 19   Ht 5\' 10"  (1.778 m)   Wt 73.3 kg   SpO2 96%   BMI 23.18 kg/m  SpO2: SpO2: 96 % O2 Device: O2 Device: Room Air O2 Flow Rate: O2 Flow Rate (L/min): 0 L/min      Palliative Assessment/Data:   Palliative Care Assessment & Plan   Discussion: ***  Assessment: ***  Recommendations/Plan: ***   Prognosis:  {Palliative Care Prognosis:23504}  Discharge Planning: {Palliative dispostion:23505}  Care plan was discussed with ***   Total time: I spent *** minutes in the care of the patient today in the above activities and documenting the encounter.  MDM ***         Render Carrie, NP  Palliative Medicine Team Team phone # [phone]  Thank you for allowing the Palliative Medicine Team to assist in the care of this patient. Please utilize secure chat with additional questions, if there is no response within 30 minutes please call the above phone number.  Palliative Medicine Team providers are available by phone from 7am to 7pm daily and can be reached through the team cell phone.  Should this patient require assistance outside of these hours, please call the patient's attending physician.

## 2023-09-12 NOTE — Progress Notes (Signed)
 Physical Therapy Treatment Patient Details Name: Christopher Murphy MRN: [id number] DOB: 25-Apr-1945 Today's Date: 09/12/2023   History of Present Illness 78 years old male presented to [facility] ED 5/20 for altered mental status and admitted for acute encephalopathy. Transferred to [facility] 5/21 for EEG to evaluate for seizures. Rapidly worsening mentation, ultimately aphasic and unresponsive. PMH: vascular dementia, prior CVA with right-sided hemiplegia, hyperlipidemia, aortic aneurysm,    PT Comments  Pt with improved participation today, eyes open and able to track activity, no verbalization or command follow. Pt is total Ax2 for coming to seated EoB with use of bed pad. Pt max/total A for seated balance, with feet positioned and UE in advantageous position pt able to static sit with CGA for ~10 sec before L lateral lean placed CoG outside BoS. OT provided UE PROM, Pt able to sit ~10 min, pt then exhibits increased lateral lean back towards bed.  Pt requires total Ax2 to return to bed. D/c plans remain appropriate at this time.     If plan is discharge home, recommend the following: Two people to help with walking and/or transfers;Two people to help with bathing/dressing/bathroom;Assistance with feeding;Assistance with cooking/housework;Direct supervision/assist for medications management;Assist for transportation;Supervision due to cognitive status;Direct supervision/assist for financial management;Help with stairs or ramp for entrance   Can travel by private vehicle      No  Equipment Recommendations  Hospital bed;Hoyer lift       Precautions / Restrictions Precautions Precautions: Fall Recall of Precautions/Restrictions: Impaired Restrictions Weight Bearing Restrictions Per Provider Order: No     Mobility  Bed Mobility Overal bed mobility: Needs Assistance Bed Mobility: Supine to Sit, Sit to Supine     Supine to sit: Total assist, +2 for physical assistance, +2 for safety/equipment Sit  to supine: Total assist, +2 for physical assistance, +2 for safety/equipment   General bed mobility comments: use of pad uderneath pt to elevate trunk and rotate hips and scoot to EOB. Pt able to steady self CGA ~10 seconds, otherwise required max/total A for balance/support. Pt sat EOB ~12 minutes for activity tolerance, UE PROM    Transfers                   General transfer comment: deferred    Ambulation/Gait               General Gait Details: unable       Balance Overall balance assessment: Needs assistance Sitting-balance support: Feet supported Sitting balance-Leahy Scale: Poor Sitting balance - Comments: L lateral lean and posterior lean, able to setay self CGA ~10 seconds Postural control: Left lateral lean, Posterior lean                                  Communication Communication Communication: Impaired Factors Affecting Communication: Non - English speaking, interpreter not available;Other (comment) (non verbal during session)  Cognition Arousal: Alert Behavior During Therapy: Restless   PT - Cognitive impairments: History of cognitive impairments, No family/caregiver present to determine baseline                       PT - Cognition Comments: pt unable to follow commands Following commands: Impaired      Cueing Cueing Techniques: Verbal cues, Gestural cues, Tactile cues, Visual cues     General Comments General comments (skin integrity, edema, etc.): VSS on RA      Pertinent Vitals/Pain  Pain Assessment Pain Assessment: Faces Faces Pain Scale: Hurts little more Pain Location: with ROM of shoulders Pain Descriptors / Indicators: Grimacing, Moaning Pain Intervention(s): Limited activity within patient's tolerance, Monitored during session, Repositioned     PT Goals (current goals can now be found in the care plan section) Acute Rehab PT Goals PT Goal Formulation: Patient unable to participate in goal  setting Time For Goal Achievement: 09/23/23 Potential to Achieve Goals: Poor Progress towards PT goals: Progressing toward goals    Frequency    Min 1X/week       Co-evaluation   Reason for Co-Treatment: For patient/therapist safety;To address functional/ADL transfers;Necessary to address cognition/behavior during functional activity;Complexity of the patient's impairments (multi-system involvement)   OT goals addressed during session: ADL's and self-care;Strengthening/ROM      AM-PAC PT "6 Clicks" Mobility   Outcome Measure  Help needed turning from your back to your side while in a flat bed without using bedrails?: Total Help needed moving from lying on your back to sitting on the side of a flat bed without using bedrails?: Total Help needed moving to and from a bed to a chair (including a wheelchair)?: Total Help needed standing up from a chair using your arms (e.g., wheelchair or bedside chair)?: Total Help needed to walk in hospital room?: Total Help needed climbing 3-5 steps with a railing? : Total 6 Click Score: 6    End of Session   Activity Tolerance: Patient limited by fatigue Patient left: in bed;with call bell/phone within reach;with bed alarm set Nurse Communication: Mobility status PT Visit Diagnosis: Muscle weakness (generalized) (M62.81);Adult, failure to thrive (R62.7);Other symptoms and signs involving the nervous system (R29.898) Hemiplegia - Right/Left: Right Hemiplegia - dominant/non-dominant: Dominant Hemiplegia - caused by: Cerebral infarction     Time: 1343-1410 PT Time Calculation (min) (ACUTE ONLY): 27 min  Charges:    $Therapeutic Activity: 8-22 mins PT General Charges $$ ACUTE PT VISIT: 1 Visit                     Gordy Goar B. Jewel Mortimer PT, DPT Acute Rehabilitation Services Please use secure chat or  Call Office [phone]    Christopher Murphy [facility] 09/12/2023, 2:56 PM

## 2023-09-12 NOTE — Progress Notes (Signed)
 PROGRESS NOTE  Christopher Murphy ZOX:[id number] DOB: 02/23/1946 DOA: 08/28/2023 PCP: Carollynn Cirri, NP   LOS: 15 days   Brief Narrative / Interim history: 78 years old male with past medical history of vascular dementia, prior CVA with right-sided hemiplegia, hyperlipidemia, aortic aneurysm, was transferred from [facility] for altered mental status.  He has been admitted with acute encephalopathy and aphasia secondary to seizures noted on EEG on 08/29/23.  Neurology following: On antiepileptics as per neurology.  Mental status has not much improved.  Palliative care also consulted for ongoing goals of care.  Due to persistent encephalopathy and inability to eat he has been started on tube feeds via core track  Subjective / 24h Interval events: He remains on responsive today  Assesement and Plan: Principal Problem:   Encephalopathy Active Problems:   Decreased level of consciousness   Vertebral artery dissection (HCC)   Protein-calorie malnutrition, severe  Principal problem Acute metabolic encephalopathy, persistent, seizures -patient remains persistently encephalopathic, aphasic and unresponsive.  He does have a history of vascular dementia as well as prior CVA with right-sided hemiplegia.  Neurology consulted and following, MRI and EEG findings concerning for seizures, initially on Keppra  which was since discontinued to minimize sedation by neurology - Currently he is on amantadine  and remains poorly responsive - Palliative care consulted as well. - Neurology plans to discuss with family regarding prognosis, Dr Janett Medin will touch base with the family today as he knows them from outpatient  Active problems Right vertebral artery dissection-neurology recommends dual antiplatelet therapy for 90 days followed by Plavix   Fever, possible aspiration pneumonia-completed Unasyn  course  Acute metabolic acidosis-resolved  Hypokalemia-improved  Hyponatremia-improved  Anemia of chronic  disease-monitor, no signs of bleeding  Aortic aneurysm-noted  Hypophosphatemia-continue to monitor  Severe malnutrition due to chronic illness-continue tube feeds.  Goals of care-ongoing discussions  Scheduled Meds:  amantadine   100 mg Per Tube BID   aspirin   325 mg Per Tube Daily   clopidogrel   75 mg Per Tube Daily   docusate  50 mg Per Tube BID   feeding supplement (PROSource TF20)  60 mL Per Tube Daily   glycopyrrolate   0.1 mg Intravenous Q8H   memantine   10 mg Per Tube BID   multivitamin with minerals  1 tablet Per Tube Daily   mouth rinse  15 mL Mouth Rinse 4 times per day   scopolamine   1 patch Transdermal Q72H   sennosides  5 mL Per Tube BID   sodium chloride  flush  3 mL Intravenous Q12H   Continuous Infusions:  feeding supplement (OSMOLITE 1.2 CAL) 1,000 mL (09/10/23 0742)   PRN Meds:.acetaminophen , hydrALAZINE , mouth rinse, polyethylene glycol  Current Outpatient Medications  Medication Instructions   acetaminophen  (TYLENOL ) 650 mg, Rectal, Every 4 hours PRN   aspirin  300 mg, Rectal, Daily   cefTRIAXone  2 g in sodium chloride  0.9 % 100 mL 2 g, Intravenous, Every 24 hours   enoxaparin  (LOVENOX ) 40 mg, Subcutaneous, Every 24 hours   Polyethyl Glycol-Propyl Glycol (SYSTANE) 0.4-0.3 % SOLN 1 drop, Both Eyes, 2 times daily   sodium chloride  0.9 % infusion 50 mLs, Intravenous, Continuous    Diet Orders (From admission, onward)     Start     Ordered   08/28/23 1930  Diet NPO time specified  Diet effective now        08/28/23 1931            DVT prophylaxis: Place and maintain sequential compression device Start: 08/28/23 2004  Lab Results  Component Value Date   PLT 421 (H) 09/12/2023      Code Status: Limited: Do not attempt resuscitation (DNR) -DNR-LIMITED -Do Not Intubate/DNI   Family Communication: No family at bedside  Status is: Inpatient Remains inpatient appropriate because: Severity of illness   Level of care: Med-Surg  Consultants:   Neurology Palliative care  Objective: Vitals:   09/11/23 2302 09/12/23 0350 09/12/23 0500 09/12/23 0904  BP: 115/70 120/65  117/68  Pulse: 84 91    Resp: 20 20  19   Temp: 97.9 F (36.6 C) 97.9 F (36.6 C)  97.7 F (36.5 C)  TempSrc: Axillary Axillary  Axillary  SpO2: 95% 95%  96%  Weight:   73.3 kg   Height:        Intake/Output Summary (Last 24 hours) at 09/12/2023 0930 Last data filed at 09/12/2023 0418 Gross per 24 hour  Intake 120 ml  Output 1150 ml  Net -1030 ml   Wt Readings from Last 3 Encounters:  09/12/23 73.3 kg  08/27/23 68.1 kg  07/15/23 66.7 kg    Examination: Constitutional: NAD Eyes: lids and conjunctivae normal, no scleral icterus ENMT: mmm Neck: normal, supple Respiratory: clear to auscultation bilaterally, no wheezing, no crackles. Normal respiratory effort.  Cardiovascular: Regular rate and rhythm, no murmurs / rubs / gallops. No LE edema. Abdomen: soft, no distention, no tenderness. Bowel sounds positive.   Data Reviewed: I have independently reviewed following labs and imaging studies   CBC Recent Labs  Lab 09/08/23 0454 09/11/23 0353 09/12/23 0405  WBC 9.6 10.2 8.6  HGB 11.9* 11.7* 11.8*  HCT 36.6* 35.5* 36.1*  PLT 361 410* 421*  MCV 81.9 81.2 80.8  MCH 26.6 26.8 26.4  MCHC 32.5 33.0 32.7  RDW 14.6 14.6 14.6  LYMPHSABS 1.8 1.9  --   MONOABS 0.8 0.6  --   EOSABS 0.7* 0.5  --   BASOSABS 0.1 0.1  --     Recent Labs  Lab 09/06/23 0418 09/08/23 0454 09/11/23 0353 09/12/23 0405  NA 135 135 135 136  K 4.0 4.2 4.1 4.1  CL 106 103 102 104  CO2 22 23 24 25   GLUCOSE 121* 126* 126* 115*  BUN 16 22 22 22   CREATININE 0.80 0.80 0.83 0.78  CALCIUM  8.0* 8.4* 8.6* 8.7*  AST  --   --  32 27  ALT  --   --  50* 43  ALKPHOS  --   --  65 70  BILITOT  --   --  0.4 0.5  ALBUMIN  --   --  2.4* 2.3*  MG 2.0 2.2 2.2 2.2    ------------------------------------------------------------------------------------------------------------------ No  results for input(s): "CHOL", "HDL", "LDLCALC", "TRIG", "CHOLHDL", "LDLDIRECT" in the last 72 hours.  Lab Results  Component Value Date   HGBA1C 5.5 06/26/2023   ------------------------------------------------------------------------------------------------------------------ No results for input(s): "TSH", "T4TOTAL", "T3FREE", "THYROIDAB" in the last 72 hours.  Invalid input(s): "FREET3"  Cardiac Enzymes No results for input(s): "CKMB", "TROPONINI", "MYOGLOBIN" in the last 168 hours.  Invalid input(s): "CK" ------------------------------------------------------------------------------------------------------------------ No results found for: "BNP"  CBG: Recent Labs  Lab 09/11/23 0519 09/11/23 1150 09/11/23 1833 09/11/23 2304 09/12/23 0624  GLUCAP 103* 113* 117* 113* 110*    No results found for this or any previous visit (from the past 240 hours).   Radiology Studies: No results found.   Kathlen Para, MD, PhD [facility]  Between 7 am - 7 pm I am available, please contact me via Amion (  for emergencies) or Securechat (non urgent messages)  Between 7 pm - 7 am I am not available, please contact night coverage MD/APP via Amion

## 2023-09-12 NOTE — Plan of Care (Signed)

## 2023-09-12 NOTE — Plan of Care (Signed)
  Problem: Pain Managment: Goal: General experience of comfort will improve and/or be controlled Outcome: Progressing   Problem: Safety: Goal: Ability to remain free from injury will improve Outcome: Progressing   Problem: Skin Integrity: Goal: Risk for impaired skin integrity will decrease Outcome: Progressing

## 2023-09-12 NOTE — Progress Notes (Signed)
 Occupational Therapy Treatment Patient Details Name: Christopher Murphy MRN: [id number] DOB: 1945-05-28 Today's Date: 09/12/2023   History of present illness 78 years old male presented to [facility] ED 5/20 for altered mental status and admitted for acute encephalopathy. Transferred to [facility] 5/21 for EEG to evaluate for seizures. Rapidly worsening mentation, ultimately aphasic and unresponsive. PMH: vascular dementia, prior CVA with right-sided hemiplegia, hyperlipidemia, aortic aneurysm,   OT comments  Pt with some improvement in alertness today. Pt total A x 2 to sit EOB with use of pad uderneath pt to elevate trunk and rotate hips and scoot to EOB. Pt able to steady self CGA ~10 seconds, otherwise required max/total A for balance/support. Pt sat EOB ~12 minutes for activity tolerance, UE PROM. Attempted to have pt wash face with wahscloth but pt unable to follow commands/initiate task. Pt returned to bed total A x 2 and positioned to [facility] with heels floated at end of session.       If plan is discharge home, recommend the following:  Two people to help with walking and/or transfers;Two people to help with bathing/dressing/bathroom;Supervision due to cognitive status   Equipment Recommendations  Hospital bed;Hoyer lift    Recommendations for Other Services      Precautions / Restrictions Precautions Precautions: Fall Recall of Precautions/Restrictions: Impaired Restrictions Weight Bearing Restrictions Per Provider Order: No       Mobility Bed Mobility Overal bed mobility: Needs Assistance Bed Mobility: Supine to Sit, Sit to Supine     Supine to sit: Total assist, +2 for physical assistance, +2 for safety/equipment Sit to supine: Total assist, +2 for physical assistance, +2 for safety/equipment   General bed mobility comments: use of pad uderneath pt to elevate trunk and rotate hips and scoot to EOB. Pt able to steady self CGA ~10 seconds, otherwise required max/total A for  balance/support. Pt sat EOB ~12 minutes for activity tolerance, UE PROM    Transfers                   General transfer comment: deferred     Balance Overall balance assessment: Needs assistance Sitting-balance support: Feet supported Sitting balance-Leahy Scale: Poor Sitting balance - Comments: L lateral lean and posterior lean, able to setay self CGA ~10 seconds Postural control: Left lateral lean, Posterior lean                                 ADL either performed or assessed with clinical judgement   ADL                                         General ADL Comments: TOTAL A for all ADLs, attempted to have pt wash face with wahscloth but pt unable to follow commands/initiate task    Extremity/Trunk Assessment Upper Extremity Assessment Upper Extremity Assessment: Generalized weakness RUE Deficits / Details: baseline CVA deficits; pt will squeeze therapist hand with L hand 0/4 trials LUE Deficits / Details: pt will squeeze therpaist hand 1/4 trials            Vision       Perception     Praxis     Communication Communication Communication: Impaired Factors Affecting Communication: Non - English speaking, interpreter not available;Other (comment) (non verbal during session)   Cognition Arousal: Alert   Cognition: History of  cognitive impairments, Cognition impaired                               Following commands: Impaired        Cueing   Cueing Techniques: Verbal cues, Gestural cues, Tactile cues, Visual cues  Exercises      Shoulder Instructions       General Comments      Pertinent Vitals/ Pain       Pain Assessment Pain Assessment: Faces Pain Location: with ROM of shoulders Pain Descriptors / Indicators: Grimacing, Moaning Pain Intervention(s): Limited activity within patient's tolerance, Monitored during session, Repositioned  Home Living                                           Prior Functioning/Environment              Frequency  Min 1X/week        Progress Toward Goals  OT Goals(current goals can now be found in the care plan section)  Progress towards OT goals: OT to reassess next treatment     Plan      Co-evaluation    PT/OT/SLP Co-Evaluation/Treatment: Yes Reason for Co-Treatment: For patient/therapist safety;To address functional/ADL transfers;Necessary to address cognition/behavior during functional activity;Complexity of the patient's impairments (multi-system involvement)   OT goals addressed during session: ADL's and self-care;Strengthening/ROM      AM-PAC OT "6 Clicks" Daily Activity     Outcome Measure   Help from another person eating meals?: Total Help from another person taking care of personal grooming?: Total Help from another person toileting, which includes using toliet, bedpan, or urinal?: Total Help from another person bathing (including washing, rinsing, drying)?: Total Help from another person to put on and taking off regular upper body clothing?: Total Help from another person to put on and taking off regular lower body clothing?: Total 6 Click Score: 6    End of Session    OT Visit Diagnosis: Unsteadiness on feet (R26.81);Other abnormalities of gait and mobility (R26.89);Muscle weakness (generalized) (M62.81)   Activity Tolerance Patient limited by fatigue   Patient Left in bed;with call bell/phone within reach;with bed alarm set   Nurse Communication Mobility status        Time: 1343-1410 OT Time Calculation (min): 27 min  Charges: OT General Charges $OT Visit: 1 Visit OT Treatments $Therapeutic Activity: 8-22 mins    Alfred Ann 09/12/2023, 2:38 PM

## 2023-09-13 DIAGNOSIS — G9340 Encephalopathy, unspecified: Secondary | ICD-10-CM | POA: Diagnosis not present

## 2023-09-13 DIAGNOSIS — Z558 Other problems related to education and literacy: Secondary | ICD-10-CM

## 2023-09-13 LAB — GLUCOSE, CAPILLARY
Glucose-Capillary: 112 mg/dL — ABNORMAL HIGH (ref 70–99)
Glucose-Capillary: 124 mg/dL — ABNORMAL HIGH (ref 70–99)
Glucose-Capillary: 125 mg/dL — ABNORMAL HIGH (ref 70–99)
Glucose-Capillary: 134 mg/dL — ABNORMAL HIGH (ref 70–99)
Glucose-Capillary: 98 mg/dL (ref 70–99)

## 2023-09-13 NOTE — Progress Notes (Signed)
 Consultation Note Date: 09/13/2023   Patient Name: Christopher Murphy  DOB: 12-15-1945  MRN: [id number]  Age / Sex: 78 y.o., male  PCP: Carollynn Cirri, NP Referring Physician: Osborn Blaze, MD  Reason for Consultation: Establishing goals of care  HPI/Patient Profile: 78 y.o. male  with past medical history of vascular dementia, prior CVA with right-sided hemiplegia, hyperlipidemia, aortic aneurysm admitted on 08/28/2023 with acute encephalopathy and aphasia secondary to seizures noted on EEG on 08/29/23.   Admission day 15, patient remains minimally responsive, tube feeding dependent, and encephalopathic despite optimal medical therapies.  There has been much discussion with family regarding anticipatory care needs, advance care planning, and goals of care conversation.  Clinical Assessment and Goals of Care:  I have reviewed medical records including EPIC notes, labs, vital signs, assessed the patient and then met with daughter-in-law and patient's wife to discuss diagnosis prognosis, GOC, EOL wishes, disposition and options.  I introduced Palliative Medicine as specialized medical care for people living with serious illness. It focuses on providing relief from the symptoms and stress of a serious illness. The goal is to improve quality of life for both the patient and the family.  I attempted to elicit values and goals of care important to the patient.    Medical History Review and Understanding:   Patient's daughter-in-law and wife understand that he has been here for quite some time and has not made significant improvement.  However, they state they remain hopeful he will improve.  We discussed that unfortunately, he has been optimized on medical therapies and at this point we would have expected him to already have improved.  They share the hopes that because his other body systems are doing well but only has brain is not functioning that he may get  better.  We discussed that unfortunately, this is very likely his new baseline and encouraged him to think about if this level of quality of life would be something he would have wanted for himself.  Social History:  From home with family. Per prior neuro notes required 24 hr supervision at home.   Functional State:  Prior: Per previous neuro notes, advanced dementia at baseline, ambulated with cane with some difficulty. Normally was able to dress himself. Normally able to carry on conversation.   Current: Completely dependent, nonverbal, tube feeding dependent.   Patient appears comfortable.  No concerns for nonverbal pain or shortness of breath reported.  Family is concerned about constipation and note he has not had a bowel movement since last week.  Advance Directives:  A detailed discussion regarding advanced directives was had.  Shared that even with him being optimized on medical therapies he has not made the improvements we would hope and that his current status is likely new baseline.  We spoke extensively regarding feeding tubes and the risk and benefits.  Also discussed limitations in the setting of irreversible illness.  Also discussed goals of care.  Shared that if goals are comfort then surgically inserting a feeding tube would be conflict with desire for comfort only.  Discussed various disposition options including that if PEG is placed he would require transfer to SNF.  This would be under long-term care and not a therapy stay due to poor rehab potential.  We also discussed disposition options for hospice if they elected comfort care route.  We also discussed what comfort care would look like including discontinuing non comfort medications and initiating as needed comfort meds.  Discussed the focus  would be on keeping him clean, warm, dry and comfortable and treating him as a person.   Code Status: DNR-Limited  Discussion:  Family continues to struggle with decision making  regarding artificial feeding. Family remains very interested in speaking with someone from IR team to discuss PEG.  She states that they are pretty sure they want him to transition to comfort/inpatient hospice but they would like to speak with someone from the department that would place the tube.   The difference between aggressive medical intervention and comfort care was considered in light of the patient's goals of care.   Discussed the importance of continued conversation with family and the medical providers regarding overall plan of care and treatment options, ensuring decisions are within the context of the patient's values and GOCs.  Will plan to meet with family tomorrow per their request to get an update regarding her decision.  Questions and concerns were addressed.  The family was encouraged to call with questions or concerns.  PMT will continue to support holistically.   NEXT OF KIN    SUMMARY OF RECOMMENDATIONS   Continue DNR/limited Ongoing goals of care discussion Consult IR per family request to discuss technical aspects of PEG tube placement Continue current medical plan including tube feedings until further plan is clarified Tx for constipation but otherwise comfort needs appear met Continue oral care  Code Status/Advance Care Planning: DNR   Symptom Management:  Tylenol  suppository Will change MiraLAX  from as needed to scheduled Robinul  for secretions Amantadine    Palliative Prophylaxis:  Bowel Regimen   Psycho-social/Spiritual:  Desire for further Chaplaincy support:no   Prognosis:  Unable to determine  Discharge Planning: To Be Determined      Primary Diagnoses: Present on Admission:  Encephalopathy    Physical Exam Constitutional:      General: He is not in acute distress.    Appearance: He is ill-appearing. He is not toxic-appearing.  Pulmonary:     Effort: Pulmonary effort is normal. No respiratory distress.  Abdominal:      Palpations: Abdomen is soft.  Neurological:     Mental Status: He is lethargic.     Vital Signs: BP 111/72 (BP Location: Right Arm)   Pulse 89   Temp 98.2 F (36.8 C) (Axillary)   Resp 14   Ht 5\' 10"  (1.778 m)   Wt 72.6 kg   SpO2 99%   BMI 22.96 kg/m  Pain Scale: PAINAD   Pain Score: 0-No pain   SpO2: SpO2: 99 % O2 Device:SpO2: 99 % O2 Flow Rate: .O2 Flow Rate (L/min): 0 L/min   Palliative Assessment/Data: 10%    Total time: I spent 55 minutes in the care of the patient today in the above activities and documenting the encounter.    Render Carrie, NP  Palliative Medicine Team Team phone # [phone]  Thank you for allowing the Palliative Medicine Team to assist in the care of this patient. Please utilize secure chat with additional questions, if there is no response within 30 minutes please call the above phone number.  Palliative Medicine Team providers are available by phone from 7am to 7pm daily and can be reached through the team cell phone.  Should this patient require assistance outside of these hours, please call the patient's attending physician.

## 2023-09-13 NOTE — Progress Notes (Signed)
 Speech Language Pathology Treatment: Dysphagia  Patient Details Name: Christopher Murphy MRN: [id number] DOB: 1946-01-17 Today's Date: 09/13/2023 Time: 4098-1191 SLP Time Calculation (min) (ACUTE ONLY): 8 min  Assessment / Plan / Recommendation Clinical Impression  Pt seen for clinical swallow re-assessment given goals of care remain aggressive at this time. No family at bedside. Pt is alert and upright. He continues to be unable to engage or follow any commands. SLP prompted small sips of thin water by straw used as a "pipette". He does close his lips around the straw and less anterior spillage is observed. Continue to note that there is no pharyngeal swallow initiation despite SLP's verbal and tactile cues. Delayed, significant, nonproductive coughing episode followed second trial of thin liquids, concerning for aspiration event. No further trials were prompted.   Plan: SLP will continue to follow along as indicated. Per notes, comfort care discussions are ongoing. Family is entertaining possibility of PEG placement. Prognosis for any swallow rehabilitation is very poor. SLP will try to follow up with family present for ongoing clinical assessment, pending goals of care decisions. Also available to support further education on comfort diet and careful hand-feeding strategies if this aligns with pt's goals of care.    HPI HPI: Christopher Murphy is a 78 y.o. male with hx of vascular dementia, prior CVA with right-sided hemiplegia, HLD, aortic aneurysm, who is transferred from [facility] for altered mental status and neurology recommendation for cEEG monitoring.  Upon admission, patient was unarousable at time of interview. History provided by son and daughter in law at bedside. Note that he was in normal state of health until awakening morning of 08/27/23.  Woke around 6:30 AM and noted to have slight more weakness on the right side.  However significantly worsened around 7 AM was unable to stand. Noted  to have abnormal speech, per son speech was actually intelligible.  However he was unable to respond to any questioning.  Per daughter-in-law noted to have abnormal eye movement possibly right-sided gaze deviation of the left eye.  Had rapidly worsened on transfer to the ED and per family has been mostly unresponsive since arrival at the hospital.     Otherwise over the past month had had intermittent fever and cough, was prescribed a course of doxycycline  for this.  No other cold-like symptoms, abdominal pain, nausea, vomiting, diarrhea, dysuria, rashes.  No history of fall or head injury, headaches.  At his baseline he is ambulatory with the use of a cane has chronic right-sided hemiplegia.  Mostly dependent on ADLs although able to feed himself and partially close with.  Mostly disoriented although intermittent lucidity.      SLP Plan  Continue with current plan of care          Recommendations  Diet recommendations: NPO Medication Administration: Via alternative means                  Oral care QID   Frequent or constant Supervision/Assistance Dysphagia, unspecified (R13.10)     Continue with current plan of care     Leverette Read  09/13/2023, 3:21 PM

## 2023-09-13 NOTE — Progress Notes (Signed)
 PROGRESS NOTE  Christopher Murphy ZOX:[id number] DOB: [birth year] DOA: 08/28/2023 PCP: Carollynn Cirri, NP   LOS: 16 days   Brief Narrative / Interim history: 78 years old male with past medical history of vascular dementia, prior CVA with right-sided hemiplegia, hyperlipidemia, aortic aneurysm, was transferred from [facility] for altered mental status.  He has been admitted with acute encephalopathy and aphasia secondary to seizures noted on EEG on 08/29/23.  Neurology following: On antiepileptics as per neurology.  Mental status has not much improved.  Palliative care also consulted for ongoing goals of care.  Due to persistent encephalopathy and inability to eat he has been started on tube feeds via core track  Subjective / 24h Interval events: Poorly responsive today   Assesement and Plan: Principal Problem:   Encephalopathy Active Problems:   Decreased level of consciousness   Vertebral artery dissection (HCC)   Protein-calorie malnutrition, severe  Principal problem Acute metabolic encephalopathy, persistent, seizures -patient remains persistently encephalopathic, aphasic and unresponsive.  He does have a history of vascular dementia as well as prior CVA with right-sided hemiplegia.  Neurology consulted and following, MRI and EEG findings concerning for seizures, initially on Keppra  which was since discontinued to minimize sedation by neurology - Currently he is on amantadine  and remains poorly responsive - Inpatient neurology as well as outpatient neurologist Dr. Janett Medin, who has known patient for years has discussed extensively with the family that he is not in a position that he will ever recover from this.  Palliative care consulted, has had multiple discussions with the family, despite 0 recovery chance, daughter-in-law asked today to discuss with someone from IR regarding the possibility of a PEG tube placement and reevaluation from physical therapy  Active problems Right vertebral  artery dissection-neurology recommends dual antiplatelet therapy for 90 days followed by Plavix   Fever, possible aspiration pneumonia-completed Unasyn  course  Acute metabolic acidosis-resolved  Hypokalemia-improved  Hyponatremia-improved  Anemia of chronic disease-monitor, no signs of bleeding  Aortic aneurysm-noted  Hypophosphatemia-continue to monitor  Severe malnutrition due to chronic illness-continue tube feeds.  Goals of care-ongoing discussions  Scheduled Meds:  amantadine   100 mg Per Tube BID   aspirin   325 mg Per Tube Daily   clopidogrel   75 mg Per Tube Daily   docusate  50 mg Per Tube BID   feeding supplement (PROSource TF20)  60 mL Per Tube Daily   glycopyrrolate   0.1 mg Intravenous Q8H   memantine   10 mg Per Tube BID   multivitamin with minerals  1 tablet Per Tube Daily   mouth rinse  15 mL Mouth Rinse 4 times per day   scopolamine   1 patch Transdermal Q72H   sennosides  5 mL Per Tube BID   sodium chloride  flush  3 mL Intravenous Q12H   Continuous Infusions:  feeding supplement (OSMOLITE 1.2 CAL) 1,000 mL (09/13/23 0905)   PRN Meds:.acetaminophen , hydrALAZINE , mouth rinse, polyethylene glycol  Current Outpatient Medications  Medication Instructions   acetaminophen  (TYLENOL ) 650 mg, Rectal, Every 4 hours PRN   aspirin  300 mg, Rectal, Daily   cefTRIAXone  2 g in sodium chloride  0.9 % 100 mL 2 g, Intravenous, Every 24 hours   enoxaparin  (LOVENOX ) 40 mg, Subcutaneous, Every 24 hours   Polyethyl Glycol-Propyl Glycol (SYSTANE) 0.4-0.3 % SOLN 1 drop, Both Eyes, 2 times daily   sodium chloride  0.9 % infusion 50 mLs, Intravenous, Continuous    Diet Orders (From admission, onward)     Start     Ordered   08/28/23 1930  Diet NPO time specified  Diet effective now        08/28/23 1931            DVT prophylaxis: Place and maintain sequential compression device Start: 08/28/23 2004   Lab Results  Component Value Date   PLT 421 (H) 09/12/2023       Code Status: Limited: Do not attempt resuscitation (DNR) -DNR-LIMITED -Do Not Intubate/DNI   Family Communication: No family at bedside  Status is: Inpatient Remains inpatient appropriate because: Severity of illness   Level of care: Med-Surg  Consultants:  Neurology Palliative care  Objective: Vitals:   09/13/23 0000 09/13/23 0407 09/13/23 0600 09/13/23 0732  BP:  117/71  115/70  Pulse: 98 89    Resp:  20  14  Temp:  98.5 F (36.9 C)  98.5 F (36.9 C)  TempSrc:  Axillary  Axillary  SpO2: 97% 99%    Weight:   72.6 kg   Height:        Intake/Output Summary (Last 24 hours) at 09/13/2023 1013 Last data filed at 09/13/2023 0407 Gross per 24 hour  Intake 3508.34 ml  Output 1100 ml  Net 2408.34 ml   Wt Readings from Last 3 Encounters:  09/13/23 72.6 kg  08/27/23 68.1 kg  07/15/23 66.7 kg    Examination: Constitutional: Poorly responsive, cachectic appearing Eyes: lids and conjunctivae normal, no scleral icterus ENMT: mmm Neck: normal, supple Respiratory: clear to auscultation bilaterally, no wheezing, no crackles. Normal respiratory effort.  Cardiovascular: Regular rate and rhythm, no murmurs / rubs / gallops.  Data Reviewed: I have independently reviewed following labs and imaging studies   CBC Recent Labs  Lab 09/08/23 0454 09/11/23 0353 09/12/23 0405  WBC 9.6 10.2 8.6  HGB 11.9* 11.7* 11.8*  HCT 36.6* 35.5* 36.1*  PLT 361 410* 421*  MCV 81.9 81.2 80.8  MCH 26.6 26.8 26.4  MCHC 32.5 33.0 32.7  RDW 14.6 14.6 14.6  LYMPHSABS 1.8 1.9  --   MONOABS 0.8 0.6  --   EOSABS 0.7* 0.5  --   BASOSABS 0.1 0.1  --     Recent Labs  Lab 09/08/23 0454 09/11/23 0353 09/12/23 0405  NA 135 135 136  K 4.2 4.1 4.1  CL 103 102 104  CO2 23 24 25   GLUCOSE 126* 126* 115*  BUN 22 22 22   CREATININE 0.80 0.83 0.78  CALCIUM  8.4* 8.6* 8.7*  AST  --  32 27  ALT  --  50* 43  ALKPHOS  --  65 70  BILITOT  --  0.4 0.5  ALBUMIN  --  2.4* 2.3*  MG 2.2 2.2 2.2     ------------------------------------------------------------------------------------------------------------------ No results for input(s): "CHOL", "HDL", "LDLCALC", "TRIG", "CHOLHDL", "LDLDIRECT" in the last 72 hours.  Lab Results  Component Value Date   HGBA1C 5.5 06/26/2023   ------------------------------------------------------------------------------------------------------------------ No results for input(s): "TSH", "T4TOTAL", "T3FREE", "THYROIDAB" in the last 72 hours.  Invalid input(s): "FREET3"  Cardiac Enzymes No results for input(s): "CKMB", "TROPONINI", "MYOGLOBIN" in the last 168 hours.  Invalid input(s): "CK" ------------------------------------------------------------------------------------------------------------------ No results found for: "BNP"  CBG: Recent Labs  Lab 09/12/23 1213 09/12/23 1835 09/12/23 2346 09/13/23 0607 09/13/23 0805  GLUCAP 115* 126* 126* 125* 134*    No results found for this or any previous visit (from the past 240 hours).   Radiology Studies: No results found.   Kathlen Para, MD, PhD [facility]  Between 7 am - 7 pm I am available, please contact me  via Amion (for emergencies) or Securechat (non urgent messages)  Between 7 pm - 7 am I am not available, please contact night coverage MD/APP via Amion

## 2023-09-13 NOTE — Plan of Care (Signed)
  Problem: Activity: Goal: Risk for activity intolerance will decrease Outcome: Progressing   Problem: Nutrition: Goal: Adequate nutrition will be maintained Outcome: Progressing   Problem: Coping: Goal: Level of anxiety will decrease Outcome: Progressing   Problem: Elimination: Goal: Will not experience complications related to bowel motility Outcome: Progressing   Problem: Elimination: Goal: Will not experience complications related to urinary retention Outcome: Progressing   Problem: Pain Managment: Goal: General experience of comfort will improve and/or be controlled Outcome: Progressing   Problem: Safety: Goal: Ability to remain free from injury will improve Outcome: Progressing   Problem: Skin Integrity: Goal: Risk for impaired skin integrity will decrease Outcome: Progressing

## 2023-09-13 NOTE — TOC Progression Note (Signed)
 Transition of Care [facility]) - Progression Note    Patient Details  Name: Christopher Murphy MRN: [id number] Date of Birth: 22-Jul-1945  [facility] CM/SW Contact  Carmon Christen, LCSWA Phone Number: 09/13/2023, 3:15 PM  Clinical Narrative:     CSW received consult. Patients daughter-n-law Christopher Murphy had questions regarding SNF benefits for patient.  CSW discussed SNF benefits and answered all questions regarding LTC placement for patient.CSW  informed patients daughter-n-law if interested in looking for LTC SNF bed that CSW would fax out closer to being medically stable. Patient currently has cortrak.Patients daughter-n-law informed CSW and CM currently still trying to decide on dc plan for patient. All questions answered. No further questions reported at this time.TOC will continue to follow.   Expected Discharge Plan:  (TBD) Barriers to Discharge: Continued Medical Work up  Expected Discharge Plan and Services In-house Referral: Clinical Social Work Discharge Planning Services: CM Consult   Living arrangements for the past 2 months: Single Family Home                                       Social Determinants of Health (SDOH) Interventions SDOH Screenings   Food Insecurity: No Food Insecurity (08/29/2023)  Housing: Low Risk  (08/29/2023)  Transportation Needs: No Transportation Needs (08/29/2023)  Utilities: Not At Risk (08/29/2023)  Depression (PHQ2-9): Low Risk  (07/15/2023)  Financial Resource Strain: Low Risk  (06/26/2023)  Physical Activity: Insufficiently Active (06/26/2023)  Social Connections: Unknown (08/29/2023)  Stress: No Stress Concern Present (06/26/2023)  Tobacco Use: Low Risk  (09/05/2023)    Readmission Risk Interventions     No data to display

## 2023-09-13 NOTE — Progress Notes (Signed)
  BRIEF IR NOTE:  Interventional Radiology was requested for G-tube placement. Request was reviewed by Dr. Marne Sings, [facility] attending. No imaging on file to evaluate gastrostomy operative window / anatomy. CT Abdomen w/o contrast ordered, pending at this time. IR will review imaging after CT completion and determine patient candidacy at that time.    Electronically Signed: Lovena Rubinstein, PA-C 09/13/2023, 11:20 AM

## 2023-09-13 NOTE — Plan of Care (Signed)
  Problem: Elimination: Goal: Will not experience complications related to bowel motility Outcome: Progressing Goal: Will not experience complications related to urinary retention Outcome: Progressing   Problem: Pain Managment: Goal: General experience of comfort will improve and/or be controlled Outcome: Progressing

## 2023-09-14 ENCOUNTER — Inpatient Hospital Stay (HOSPITAL_COMMUNITY)

## 2023-09-14 DIAGNOSIS — G9340 Encephalopathy, unspecified: Secondary | ICD-10-CM | POA: Diagnosis not present

## 2023-09-14 LAB — CBC
HCT: 38.8 % — ABNORMAL LOW (ref 39.0–52.0)
Hemoglobin: 12.5 g/dL — ABNORMAL LOW (ref 13.0–17.0)
MCH: 26.5 pg (ref 26.0–34.0)
MCHC: 32.2 g/dL (ref 30.0–36.0)
MCV: 82.4 fL (ref 80.0–100.0)
Platelets: 475 10*3/uL — ABNORMAL HIGH (ref 150–400)
RBC: 4.71 MIL/uL (ref 4.22–5.81)
RDW: 14.5 % (ref 11.5–15.5)
WBC: 8.4 10*3/uL (ref 4.0–10.5)
nRBC: 0 % (ref 0.0–0.2)

## 2023-09-14 LAB — COMPREHENSIVE METABOLIC PANEL WITH GFR
ALT: 73 U/L — ABNORMAL HIGH (ref 0–44)
AST: 46 U/L — ABNORMAL HIGH (ref 15–41)
Albumin: 2.5 g/dL — ABNORMAL LOW (ref 3.5–5.0)
Alkaline Phosphatase: 78 U/L (ref 38–126)
Anion gap: 9 (ref 5–15)
BUN: 21 mg/dL (ref 8–23)
CO2: 23 mmol/L (ref 22–32)
Calcium: 8.3 mg/dL — ABNORMAL LOW (ref 8.9–10.3)
Chloride: 101 mmol/L (ref 98–111)
Creatinine, Ser: 0.86 mg/dL (ref 0.61–1.24)
GFR, Estimated: >60 mL/min (ref >60)
Glucose, Bld: 115 mg/dL — ABNORMAL HIGH (ref 70–99)
Potassium: 4.2 mmol/L (ref 3.5–5.1)
Sodium: 133 mmol/L — ABNORMAL LOW (ref 135–145)
Total Bilirubin: 0.4 mg/dL (ref 0.0–1.2)
Total Protein: 6.9 g/dL (ref 6.5–8.1)

## 2023-09-14 LAB — GLUCOSE, CAPILLARY
Glucose-Capillary: 104 mg/dL — ABNORMAL HIGH (ref 70–99)
Glucose-Capillary: 134 mg/dL — ABNORMAL HIGH (ref 70–99)
Glucose-Capillary: 99 mg/dL (ref 70–99)

## 2023-09-14 LAB — PHOSPHORUS: Phosphorus: 3.8 mg/dL (ref 2.5–4.6)

## 2023-09-14 LAB — MAGNESIUM: Magnesium: 2.3 mg/dL (ref 1.7–2.4)

## 2023-09-14 MED ORDER — BISACODYL 10 MG RE SUPP
10.0000 mg | Freq: Every day | RECTAL | Status: DC | PRN
  Administered 2023-09-14: 10 mg via RECTAL
  Filled 2023-09-14: qty 1

## 2023-09-14 NOTE — Progress Notes (Signed)
 PROGRESS NOTE  Christopher Murphy ZOX:[id number] DOB: 1945/05/17 DOA: 08/28/2023 PCP: Carollynn Cirri, NP   LOS: 17 days   Brief Narrative / Interim history: 78 years old male with past medical history of vascular dementia, prior CVA with right-sided hemiplegia, hyperlipidemia, aortic aneurysm, was transferred from [facility] for altered mental status.  He has been admitted with acute encephalopathy and aphasia secondary to seizures noted on EEG on 08/29/23.  Neurology following: On antiepileptics as per neurology.  Mental status has not much improved.  Palliative care also consulted for ongoing goals of care.  Due to persistent encephalopathy and inability to eat he has been started on tube feeds via core track  Subjective / 24h Interval events: Room poorly responsive Assesement and Plan: Principal Problem:   Encephalopathy Active Problems:   Decreased level of consciousness   Vertebral artery dissection (HCC)   Protein-calorie malnutrition, severe  Principal problem Acute metabolic encephalopathy, persistent, seizures -patient remains persistently encephalopathic, aphasic and unresponsive.  He does have a history of vascular dementia as well as prior CVA with right-sided hemiplegia.  Neurology consulted and following, MRI and EEG findings concerning for seizures, initially on Keppra  which was since discontinued to minimize sedation by neurology - Currently he is on amantadine  and remains poorly responsive - Inpatient neurology as well as outpatient neurologist Dr. Janett Medin, who has known patient for years has discussed extensively with the family that he is not in a position that he will ever recover from this.  Palliative care consulted, has had multiple discussions with the family, despite 0 recovery chance, daughter-in-law asked today to discuss with someone from IR regarding the possibility of a PEG tube placement and reevaluation from physical therapy - IR consulted, getting a CT scan of the  abdomen to evaluate candidacy  Active problems Right vertebral artery dissection-neurology recommends dual antiplatelet therapy for 90 days followed by Plavix   Fever, possible aspiration pneumonia-completed Unasyn  course  Acute metabolic acidosis-resolved  Hypokalemia-improved  Hyponatremia-mild, overall stable  Anemia of chronic disease-monitor, no signs of bleeding, hemoglobin stable this morning  Aortic aneurysm-noted  Hypophosphatemia-continue to monitor  Severe malnutrition due to chronic illness-continue tube feeds.  Goals of care-ongoing discussions  Scheduled Meds:  amantadine   100 mg Per Tube BID   aspirin   325 mg Per Tube Daily   clopidogrel   75 mg Per Tube Daily   docusate  50 mg Per Tube BID   feeding supplement (PROSource TF20)  60 mL Per Tube Daily   glycopyrrolate   0.1 mg Intravenous Q8H   memantine   10 mg Per Tube BID   multivitamin with minerals  1 tablet Per Tube Daily   mouth rinse  15 mL Mouth Rinse 4 times per day   scopolamine   1 patch Transdermal Q72H   sennosides  5 mL Per Tube BID   sodium chloride  flush  3 mL Intravenous Q12H   Continuous Infusions:  feeding supplement (OSMOLITE 1.2 CAL) 1,000 mL (09/14/23 0517)   PRN Meds:.acetaminophen , bisacodyl , hydrALAZINE , mouth rinse, polyethylene glycol  Current Outpatient Medications  Medication Instructions   acetaminophen  (TYLENOL ) 650 mg, Rectal, Every 4 hours PRN   aspirin  300 mg, Rectal, Daily   cefTRIAXone  2 g in sodium chloride  0.9 % 100 mL 2 g, Intravenous, Every 24 hours   enoxaparin  (LOVENOX ) 40 mg, Subcutaneous, Every 24 hours   Polyethyl Glycol-Propyl Glycol (SYSTANE) 0.4-0.3 % SOLN 1 drop, Both Eyes, 2 times daily   sodium chloride  0.9 % infusion 50 mLs, Intravenous, Continuous    Diet  Orders (From admission, onward)     Start     Ordered   08/28/23 1930  Diet NPO time specified  Diet effective now        08/28/23 1931            DVT prophylaxis: Place and maintain  sequential compression device Start: 08/28/23 2004   Lab Results  Component Value Date   PLT 475 (H) 09/14/2023      Code Status: Limited: Do not attempt resuscitation (DNR) -DNR-LIMITED -Do Not Intubate/DNI   Family Communication: No family at bedside  Status is: Inpatient Remains inpatient appropriate because: Severity of illness   Level of care: Med-Surg  Consultants:  Neurology Palliative care  Objective: Vitals:   09/13/23 2345 09/14/23 0416 09/14/23 0527 09/14/23 0726  BP: 125/70 107/67  105/70  Pulse: 100 81    Resp: 16 16  13   Temp: 98.4 F (36.9 C)   97.9 F (36.6 C)  TempSrc: Oral   Axillary  SpO2: 99% 97%    Weight:   73.5 kg   Height:        Intake/Output Summary (Last 24 hours) at 09/14/2023 0956 Last data filed at 09/14/2023 9147 Gross per 24 hour  Intake --  Output 300 ml  Net -300 ml   Wt Readings from Last 3 Encounters:  09/14/23 73.5 kg  08/27/23 68.1 kg  07/15/23 66.7 kg    Examination: Constitutional: Appears comfortable but unresponsive Eyes: lids and conjunctivae normal, no scleral icterus ENMT: mmm Neck: normal, supple Respiratory: clear to auscultation bilaterally, no wheezing, no crackles. Normal respiratory effort.  Cardiovascular: Regular rate and rhythm, no murmurs / rubs / gallops. No LE edema. Abdomen: soft, no distention, no tenderness. Bowel sounds positive.   Data Reviewed: I have independently reviewed following labs and imaging studies   CBC Recent Labs  Lab 09/08/23 0454 09/11/23 0353 09/12/23 0405 09/14/23 0407  WBC 9.6 10.2 8.6 8.4  HGB 11.9* 11.7* 11.8* 12.5*  HCT 36.6* 35.5* 36.1* 38.8*  PLT 361 410* 421* 475*  MCV 81.9 81.2 80.8 82.4  MCH 26.6 26.8 26.4 26.5  MCHC 32.5 33.0 32.7 32.2  RDW 14.6 14.6 14.6 14.5  LYMPHSABS 1.8 1.9  --   --   MONOABS 0.8 0.6  --   --   EOSABS 0.7* 0.5  --   --   BASOSABS 0.1 0.1  --   --     Recent Labs  Lab 09/08/23 0454 09/11/23 0353 09/12/23 0405 09/14/23 0407   NA 135 135 136 133*  K 4.2 4.1 4.1 4.2  CL 103 102 104 101  CO2 23 24 25 23   GLUCOSE 126* 126* 115* 115*  BUN 22 22 22 21   CREATININE 0.80 0.83 0.78 0.86  CALCIUM  8.4* 8.6* 8.7* 8.3*  AST  --  32 27 46*  ALT  --  50* 43 73*  ALKPHOS  --  65 70 78  BILITOT  --  0.4 0.5 0.4  ALBUMIN  --  2.4* 2.3* 2.5*  MG 2.2 2.2 2.2 2.3    ------------------------------------------------------------------------------------------------------------------ No results for input(s): "CHOL", "HDL", "LDLCALC", "TRIG", "CHOLHDL", "LDLDIRECT" in the last 72 hours.  Lab Results  Component Value Date   HGBA1C 5.5 06/26/2023   ------------------------------------------------------------------------------------------------------------------ No results for input(s): "TSH", "T4TOTAL", "T3FREE", "THYROIDAB" in the last 72 hours.  Invalid input(s): "FREET3"  Cardiac Enzymes No results for input(s): "CKMB", "TROPONINI", "MYOGLOBIN" in the last 168 hours.  Invalid input(s): "CK" ------------------------------------------------------------------------------------------------------------------ No results found for: "  BNP"  CBG: Recent Labs  Lab 09/13/23 1439 09/13/23 1853 09/13/23 2339 09/14/23 0622 09/14/23 0803  GLUCAP 112* 98 124* 104* 134*    No results found for this or any previous visit (from the past 240 hours).   Radiology Studies: No results found.   Kathlen Para, MD, PhD [facility]  Between 7 am - 7 pm I am available, please contact me via Amion (for emergencies) or Securechat (non urgent messages)  Between 7 pm - 7 am I am not available, please contact night coverage MD/APP via Amion

## 2023-09-14 NOTE — Plan of Care (Signed)
   Problem: Activity: Goal: Risk for activity intolerance will decrease Outcome: Progressing   Problem: Nutrition: Goal: Adequate nutrition will be maintained Outcome: Progressing   Problem: Safety: Goal: Ability to remain free from injury will improve Outcome: Progressing   Problem: Skin Integrity: Goal: Risk for impaired skin integrity will decrease Outcome: Progressing

## 2023-09-14 NOTE — Plan of Care (Signed)
  Problem: Elimination: Goal: Will not experience complications related to bowel motility Outcome: Progressing Goal: Will not experience complications related to urinary retention Outcome: Progressing   

## 2023-09-14 NOTE — Progress Notes (Signed)
 Daily Progress Note   Patient Name: Christopher Murphy       Date: 09/14/2023 DOB: 26-Nov-1945  Age: 78 y.o. MRN#: [id number] Attending Physician: Osborn Blaze, MD Primary Care Physician: Carollynn Cirri, NP Admit Date: 08/28/2023  Reason for Consultation/Follow-up: Establishing goals of care  Length of Stay: 17  Current Medications: Scheduled Meds:   amantadine   100 mg Per Tube BID   aspirin   325 mg Per Tube Daily   clopidogrel   75 mg Per Tube Daily   docusate  50 mg Per Tube BID   feeding supplement (PROSource TF20)  60 mL Per Tube Daily   glycopyrrolate   0.1 mg Intravenous Q8H   memantine   10 mg Per Tube BID   multivitamin with minerals  1 tablet Per Tube Daily   mouth rinse  15 mL Mouth Rinse 4 times per day   scopolamine   1 patch Transdermal Q72H   sennosides  5 mL Per Tube BID   sodium chloride  flush  3 mL Intravenous Q12H    Continuous Infusions:  feeding supplement (OSMOLITE 1.2 CAL) 1,000 mL (09/14/23 0517)    PRN Meds: acetaminophen , bisacodyl , hydrALAZINE , mouth rinse, polyethylene glycol  Physical Exam Vitals reviewed.  Constitutional:      General: He is sleeping. He is not in acute distress.    Appearance: He is ill-appearing.     Comments: Cortrak  HENT:     Mouth/Throat:     Mouth: Mucous membranes are dry.  Skin:    General: Skin is warm and dry.             Vital Signs: BP 117/75 (BP Location: Right Arm)   Pulse 80   Temp 98.4 F (36.9 C) (Axillary)   Resp 14   Ht 5\' 10"  (1.778 m)   Wt 73.5 kg   SpO2 99%   BMI 23.24 kg/m  SpO2: SpO2: 99 % O2 Device: O2 Device: Room Air O2 Flow Rate: O2 Flow Rate (L/min): 0 L/min   Palliative Assessment/Data: 30% with feeds      Patient Active Problem List   Diagnosis Date Noted    Protein-calorie malnutrition, severe 09/05/2023   DNR (do not resuscitate) 08/28/2023   Dementia without behavioral disturbance (HCC) 08/28/2023   Encephalopathy 08/28/2023   Decreased level of consciousness 08/28/2023   Vertebral  artery dissection (HCC) 08/28/2023   CVA (cerebral vascular accident) (HCC) 08/27/2023   History of CVA (cerebrovascular accident) 03/11/2022   Acute metabolic encephalopathy 03/10/2022   Hemiparesis affecting right side as late effect of cerebrovascular accident (CVA) (HCC) 03/10/2022   GERD (gastroesophageal reflux disease) 07/19/2021   Iron deficiency anemia secondary to inadequate dietary iron intake 01/23/2021   Aortic atherosclerosis (HCC) 01/23/2021   Thoracic aortic aneurysm (HCC) 06/02/2020   Fever    Vascular dementia (HCC) 10/28/2019   Chronic cough 10/19/2019    Palliative Care Assessment & Plan   Patient Profile: 78 y.o. male  admitted on 08/28/2023 with past medical history of vascular dementia, prior CVA with right-sided hemiplegia, HLD, aortic aneurysm, who is transferred from [facility] for altered mental status.    He has been admitted with acute encephalopathy and aphasia started for seizures and remains on IV Keppra  which were performed 5/23 for further evaluation per neurology. He was evaluated with long-term EEG with no seizure activity seen, but appears to have no improvement in mentation.    Patient remains minimally responsive to vigorous stimulation, unable to follow commands.   Assessment: Reviewed chart and received update from nursing. Patient's son has requested PMT visit today. Patient lying in bed sleeping in NAD. Patient's son at bedside but patient is being taken downstairs for CT. Plan to meet with son after CT scan.  4:55 pm: Called and spoke with patient's son [name] and daughter-in-law [name].  Patient's family shared that they would like to get more information about having the patient placed in inpatient palliative care unit  at the hospital.  I explained that there was not an inpatient palliative care unit at the hospital but instead the inpatient palliative care providers would go to the location of the patient in the hospital. They understand.  They shared that they continue to struggle with the decision regarding goals of care for this patient.  They have seen the patient become more alert and have more meaningful movements and are questioning how much improvement the patient could potentially have with increased time for outcomes. I shared my concerns that the patient may not have meaningful recovery even with increased time. I encouraged them to consider what quality of life the patient would except if he could make these decisions. The family asked for me to review options moving forward if the patient gets a PEG tube. I shared that he would  likely require nursing facility care. They asked if I had a list of potential facilities and I shared I would pass this on to the social worker. We discussed that IR would likely not follow-up on whether the patient is a candidate for PEG or not until Monday.  This will give them tomorrow to continue considering options.  We agreed to have PMT follow-up on Monday. Encouraged family to reach out to PMT if they have questions or concerns before then.  Recommendations/Plan: DNR/DNI IR assessing whether patient is candidate for PEG Encouraged family to continue discussing goals of care Will reach out to SW re: potential SNF PMT will continue to follow- PMT will follow up Monday   Code Status:    Code Status Orders  (From admission, onward)           Start     Ordered   08/28/23 1959  Do not attempt resuscitation (DNR)- Limited -Do Not Intubate (DNI)  (Code Status)  Continuous       Question Answer Comment  If pulseless and not  breathing No CPR or chest compressions.   In Pre-Arrest Conditions (Patient Is Breathing and Has A Pulse) Do not intubate. Provide all appropriate  non-invasive medical interventions. Avoid ICU transfer unless indicated or required.   Consent: Discussion documented in EHR or advanced directives reviewed      08/28/23 1958           Extensive chart review has been completed prior to seeing the patient including labs, vital signs, imaging, progress/consult notes, orders, medications, and available advance directive documents.  Care plan was discussed with bedside RN and Dr. Aldona Amel  Time spent: 50 minutes  Thank you for allowing the Palliative Medicine Team to assist in the care of this patient.   Daina Drum, NP  Please contact Palliative Medicine Team phone at [phone] for questions and concerns.

## 2023-09-15 DIAGNOSIS — G9340 Encephalopathy, unspecified: Secondary | ICD-10-CM | POA: Diagnosis not present

## 2023-09-15 LAB — GLUCOSE, CAPILLARY
Glucose-Capillary: 109 mg/dL — ABNORMAL HIGH (ref 70–99)
Glucose-Capillary: 133 mg/dL — ABNORMAL HIGH (ref 70–99)
Glucose-Capillary: 83 mg/dL (ref 70–99)

## 2023-09-15 NOTE — Progress Notes (Signed)
 Neurology team was re-engaged by IR and primary teams to weigh in on possibility of holding aspirin  for 5 days as well as plavix  for 5 days to enable placement of g tube.   Dr. Ardella Beaver, Dr. Roxy Cordial, and myself (attending inpatient neurology attendings for stroke, epilepsy, and neurohospitalist, respectively) have all documented our findings this admission dating back to 08/27/23 that patient has longstanding progressive severe vascular dementia chronic R hemiplegia 2/2 remote infarct, hx multiple prior strokes, modified Rankin of 5 total care even prior to this admission who was initially admitted to [facility] 08/27/23 and eventually transferred to [facility] Dr. Janett Medin in particular has actually been this patient's outpatient neurologist for years. All 3 of us  have extensively documented and discussed with family that there is zero chance of meaningful recovery of function (cognitive, motor, other) in this patient. Our consistent recommendations against placement of g-tube or other medically-invasive procedures for this gentleman that has no hope of recovery is well-documented.  With respect to the specific question posed to neurology today regarding safety of holding of both aspirin  and plavix  together for 5 days to facilitate placement of an invasive feeding tube: no, this would not "be safe." He would be at increased risk of recurrent severe stroke by withholding plavix  and particularly aspirin  for 5 days for any reason including g tube placement.  D/w Dr. Aldona Amel.  Christopher Leaks, MD [facility] [phone]  If 7pm- 7am, please page neurology on call as listed in AMION.

## 2023-09-15 NOTE — Plan of Care (Signed)
  Problem: Self-Care: Goal: Ability to participate in self-care as condition permits will improve Outcome: Not Progressing   Problem: Self-Care: Goal: Verbalization of feelings and concerns over difficulty with self-care will improve Outcome: Not Progressing   Problem: Skin Integrity: Goal: Risk for impaired skin integrity will decrease Outcome: Not Progressing

## 2023-09-15 NOTE — Plan of Care (Signed)
  Problem: Elimination: Goal: Will not experience complications related to bowel motility Outcome: Progressing Goal: Will not experience complications related to urinary retention Outcome: Progressing   

## 2023-09-15 NOTE — Progress Notes (Signed)
 PROGRESS NOTE  Christopher Murphy ZOX:[id number] DOB: [birth year] DOA: 08/28/2023 PCP: Carollynn Cirri, NP   LOS: 18 days   Brief Narrative / Interim history: 78 years old male with past medical history of vascular dementia, prior CVA with right-sided hemiplegia, hyperlipidemia, aortic aneurysm, was transferred from [facility] for altered mental status.  He has been admitted with acute encephalopathy and aphasia secondary to seizures noted on EEG on 08/29/23.  Neurology following: On antiepileptics as per neurology.  Mental status has not much improved.  Palliative care also consulted for ongoing goals of care.  Due to persistent encephalopathy and inability to eat he has been started on tube feeds via core track  Subjective / 24h Interval events: Remains poorly responsive  Assesement and Plan: Principal Problem:   Encephalopathy Active Problems:   Decreased level of consciousness   Vertebral artery dissection (HCC)   Protein-calorie malnutrition, severe  Principal problem Acute metabolic encephalopathy, persistent, seizures -patient remains persistently encephalopathic, aphasic and unresponsive.  He does have a history of vascular dementia as well as prior CVA with right-sided hemiplegia.  Neurology consulted and following, MRI and EEG findings concerning for seizures, initially on Keppra  which was since discontinued to minimize sedation by neurology - Currently he is on amantadine  and remains poorly responsive - Inpatient neurology as well as outpatient neurologist Dr. Janett Medin, who has known patient for years has discussed extensively with the family that he is not in a position that he will ever recover from this.  Palliative care consulted, has had multiple discussions with the family, despite 0 recovery chance, daughter-in-law asked today to discuss with someone from IR regarding the possibility of a PEG tube placement and reevaluation from physical therapy - IR consulted, getting a CT scan of  the abdomen to evaluate candidacy, awaiting input from IR  Active problems Right vertebral artery dissection-neurology recommends dual antiplatelet therapy for 90 days followed by Plavix   Fever, possible aspiration pneumonia-completed Unasyn  course.  Afebrile, respiratory status stable, no leukocytosis  Acute metabolic acidosis-resolved  Hypokalemia-improved, monitor intermittently  Hyponatremia-mild, overall stable, monitor intermittently  Anemia of chronic disease-monitor, no signs of bleeding  Aortic aneurysm-noted  Hypophosphatemia-continue to monitor  Severe malnutrition due to chronic illness-continue tube feeds.  Goals of care-ongoing discussions  Scheduled Meds:  amantadine   100 mg Per Tube BID   aspirin   325 mg Per Tube Daily   clopidogrel   75 mg Per Tube Daily   docusate  50 mg Per Tube BID   feeding supplement (PROSource TF20)  60 mL Per Tube Daily   glycopyrrolate   0.1 mg Intravenous Q8H   memantine   10 mg Per Tube BID   multivitamin with minerals  1 tablet Per Tube Daily   mouth rinse  15 mL Mouth Rinse 4 times per day   scopolamine   1 patch Transdermal Q72H   sennosides  5 mL Per Tube BID   sodium chloride  flush  3 mL Intravenous Q12H   Continuous Infusions:  feeding supplement (OSMOLITE 1.2 CAL) 1,000 mL (09/15/23 0040)   PRN Meds:.acetaminophen , bisacodyl , hydrALAZINE , mouth rinse, polyethylene glycol  Current Outpatient Medications  Medication Instructions   acetaminophen  (TYLENOL ) 650 mg, Rectal, Every 4 hours PRN   aspirin  300 mg, Rectal, Daily   cefTRIAXone  2 g in sodium chloride  0.9 % 100 mL 2 g, Intravenous, Every 24 hours   enoxaparin  (LOVENOX ) 40 mg, Subcutaneous, Every 24 hours   Polyethyl Glycol-Propyl Glycol (SYSTANE) 0.4-0.3 % SOLN 1 drop, Both Eyes, 2 times daily   sodium  chloride 0.9 % infusion 50 mLs, Intravenous, Continuous    Diet Orders (From admission, onward)     Start     Ordered   08/28/23 1930  Diet NPO time specified  Diet  effective now        08/28/23 1931            DVT prophylaxis: Place and maintain sequential compression device Start: 08/28/23 2004   Lab Results  Component Value Date   PLT 475 (H) 09/14/2023      Code Status: Limited: Do not attempt resuscitation (DNR) -DNR-LIMITED -Do Not Intubate/DNI   Family Communication: No family at bedside  Status is: Inpatient Remains inpatient appropriate because: Severity of illness   Level of care: Med-Surg  Consultants:  Neurology Palliative care  Objective: Vitals:   09/14/23 2040 09/15/23 0041 09/15/23 0440 09/15/23 0720  BP: 107/61 106/65 110/63 121/70  Pulse: 84 87 89 92  Resp: 16 16 16 19   Temp: 97.7 F (36.5 C) 97.8 F (36.6 C) 98.1 F (36.7 C) 97.9 F (36.6 C)  TempSrc: Axillary Oral Axillary Axillary  SpO2: 97% 96% 97% 97%  Weight:   75.8 kg   Height:        Intake/Output Summary (Last 24 hours) at 09/15/2023 1007 Last data filed at 09/15/2023 0700 Gross per 24 hour  Intake 0 ml  Output 800 ml  Net -800 ml   Wt Readings from Last 3 Encounters:  09/15/23 75.8 kg  08/27/23 68.1 kg  07/15/23 66.7 kg    Examination: Constitutional: Moving his arms intermittently, appears comfortable Eyes: lids and conjunctivae normal, no scleral icterus ENMT: mmm Neck: normal, supple Respiratory: clear to auscultation bilaterally, no wheezing, no crackles. Normal respiratory effort.  Cardiovascular: Regular rate and rhythm, no murmurs / rubs / gallops. No LE edema. Abdomen: soft, no distention, no tenderness. Bowel sounds positive.   Data Reviewed: I have independently reviewed following labs and imaging studies   CBC Recent Labs  Lab 09/11/23 0353 09/12/23 0405 09/14/23 0407  WBC 10.2 8.6 8.4  HGB 11.7* 11.8* 12.5*  HCT 35.5* 36.1* 38.8*  PLT 410* 421* 475*  MCV 81.2 80.8 82.4  MCH 26.8 26.4 26.5  MCHC 33.0 32.7 32.2  RDW 14.6 14.6 14.5  LYMPHSABS 1.9  --   --   MONOABS 0.6  --   --   EOSABS 0.5  --   --    BASOSABS 0.1  --   --     Recent Labs  Lab 09/11/23 0353 09/12/23 0405 09/14/23 0407  NA 135 136 133*  K 4.1 4.1 4.2  CL 102 104 101  CO2 24 25 23   GLUCOSE 126* 115* 115*  BUN 22 22 21   CREATININE 0.83 0.78 0.86  CALCIUM  8.6* 8.7* 8.3*  AST 32 27 46*  ALT 50* 43 73*  ALKPHOS 65 70 78  BILITOT 0.4 0.5 0.4  ALBUMIN 2.4* 2.3* 2.5*  MG 2.2 2.2 2.3    ------------------------------------------------------------------------------------------------------------------ No results for input(s): "CHOL", "HDL", "LDLCALC", "TRIG", "CHOLHDL", "LDLDIRECT" in the last 72 hours.  Lab Results  Component Value Date   HGBA1C 5.5 06/26/2023   ------------------------------------------------------------------------------------------------------------------ No results for input(s): "TSH", "T4TOTAL", "T3FREE", "THYROIDAB" in the last 72 hours.  Invalid input(s): "FREET3"  Cardiac Enzymes No results for input(s): "CKMB", "TROPONINI", "MYOGLOBIN" in the last 168 hours.  Invalid input(s): "CK" ------------------------------------------------------------------------------------------------------------------ No results found for: "BNP"  CBG: Recent Labs  Lab 09/14/23 0622 09/14/23 0803 09/14/23 1400 09/15/23 0031 09/15/23 0513  GLUCAP  104* 134* 99 133* 109*    No results found for this or any previous visit (from the past 240 hours).   Radiology Studies: No results found.   Kathlen Para, MD, PhD [facility]  Between 7 am - 7 pm I am available, please contact me via Amion (for emergencies) or Securechat (non urgent messages)  Between 7 pm - 7 am I am not available, please contact night coverage MD/APP via Amion

## 2023-09-15 NOTE — Progress Notes (Signed)
 Daily Progress Note   Patient Name: Christopher Murphy       Date: 09/15/2023 DOB: December 21, 1945  Age: 78 y.o. MRN#: [id number] Attending Physician: Osborn Blaze, MD Primary Care Physician: Carollynn Cirri, NP Admit Date: 08/28/2023  Reason for Consultation/Follow-up: Establishing goals of care  Length of Stay: 18  Current Medications: Scheduled Meds:   amantadine   100 mg Per Tube BID   aspirin   325 mg Per Tube Daily   clopidogrel   75 mg Per Tube Daily   docusate  50 mg Per Tube BID   feeding supplement (PROSource TF20)  60 mL Per Tube Daily   glycopyrrolate   0.1 mg Intravenous Q8H   memantine   10 mg Per Tube BID   multivitamin with minerals  1 tablet Per Tube Daily   mouth rinse  15 mL Mouth Rinse 4 times per day   scopolamine   1 patch Transdermal Q72H   sennosides  5 mL Per Tube BID   sodium chloride  flush  3 mL Intravenous Q12H    Continuous Infusions:  feeding supplement (OSMOLITE 1.2 CAL) 1,000 mL (09/15/23 0040)    PRN Meds: acetaminophen , bisacodyl , hydrALAZINE , mouth rinse, polyethylene glycol  Physical Exam Vitals reviewed.  Constitutional:      General: He is awake. He is not in acute distress.    Appearance: He is ill-appearing.     Comments: Cortrak  HENT:     Mouth/Throat:     Mouth: Mucous membranes are dry.  Skin:    General: Skin is warm and dry.             Vital Signs: BP 121/70 (BP Location: Right Arm)   Pulse 92   Temp 97.9 F (36.6 C) (Axillary)   Resp 19   Ht 5\' 10"  (1.778 m)   Wt 75.8 kg   SpO2 97%   BMI 23.96 kg/m  SpO2: SpO2: 97 % O2 Device: O2 Device: Room Air O2 Flow Rate: O2 Flow Rate (L/min): 0 L/min   Palliative Assessment/Data: 30% with feeds      Patient Active Problem List   Diagnosis Date Noted    Protein-calorie malnutrition, severe 09/05/2023   DNR (do not resuscitate) 08/28/2023   Dementia without behavioral disturbance (HCC) 08/28/2023   Encephalopathy 08/28/2023   Decreased level of consciousness 08/28/2023   Vertebral  artery dissection (HCC) 08/28/2023   CVA (cerebral vascular accident) (HCC) 08/27/2023   History of CVA (cerebrovascular accident) 03/11/2022   Acute metabolic encephalopathy 03/10/2022   Hemiparesis affecting right side as late effect of cerebrovascular accident (CVA) (HCC) 03/10/2022   GERD (gastroesophageal reflux disease) 07/19/2021   Iron deficiency anemia secondary to inadequate dietary iron intake 01/23/2021   Aortic atherosclerosis (HCC) 01/23/2021   Thoracic aortic aneurysm (HCC) 06/02/2020   Fever    Vascular dementia (HCC) 10/28/2019   Chronic cough 10/19/2019    Palliative Care Assessment & Plan   Patient Profile: 78 y.o. male  admitted on 08/28/2023 with past medical history of vascular dementia, prior CVA with right-sided hemiplegia, HLD, aortic aneurysm, who is transferred from [facility] for altered mental status.    He has been admitted with acute encephalopathy and aphasia started for seizures and remains on IV Keppra  which were performed 5/23 for further evaluation per neurology. He was evaluated with long-term EEG with no seizure activity seen, but appears to have no improvement in mentation.    Patient remains minimally responsive to vigorous stimulation, unable to follow commands.   Assessment: Reviewed chart. Patient lying in bed awake but he does not respond as I call his name or approach him. He looks comfortable and in NAD. No family is at bedside.  10:10 am: Attempted to call patient's son [name] to update him. Left voicemail re: nursing facility options. Shared that I spoke with SW and they will not know potential nursing facility options until closer to discharge. Patient's insurance does not require authorization which can simplify  the process. Encouraged him to call PMT with questions. PMT will plan on checking in early this week.  Recommendations/Plan: DNR/DNI IR assessing whether patient is candidate for PEG Encouraged family to continue discussing goals of care Reached out to SW re: potential SNF PMT will continue to follow   Code Status:    Code Status Orders  (From admission, onward)           Start     Ordered   08/28/23 1959  Do not attempt resuscitation (DNR)- Limited -Do Not Intubate (DNI)  (Code Status)  Continuous       Question Answer Comment  If pulseless and not breathing No CPR or chest compressions.   In Pre-Arrest Conditions (Patient Is Breathing and Has A Pulse) Do not intubate. Provide all appropriate non-invasive medical interventions. Avoid ICU transfer unless indicated or required.   Consent: Discussion documented in EHR or advanced directives reviewed      08/28/23 1958           Extensive chart review has been completed prior to seeing the patient including labs, vital signs, imaging, progress/consult notes, orders, medications, and available advance directive documents.  Care plan was discussed with bedside RN and Edwina Gram LCSW  Time spent: 25 minutes  Thank you for allowing the Palliative Medicine Team to assist in the care of this patient.   Daina Drum, NP  Please contact Palliative Medicine Team phone at [phone] for questions and concerns.

## 2023-09-16 LAB — GLUCOSE, CAPILLARY
Glucose-Capillary: 113 mg/dL — ABNORMAL HIGH (ref 70–99)
Glucose-Capillary: 119 mg/dL — ABNORMAL HIGH (ref 70–99)
Glucose-Capillary: 124 mg/dL — ABNORMAL HIGH (ref 70–99)
Glucose-Capillary: 126 mg/dL — ABNORMAL HIGH (ref 70–99)

## 2023-09-16 NOTE — Consult Note (Signed)
        Interventional radiology was consulted for possible gastrostomy tube placement.  After thorough discussion with pts daughter in law, [name], the family no longer wishes to proceed with gastrostomy tube placement.    Thank you for the opportunity to participate in Christopher Murphy care.    Electronically Signed: Pasty Bongo, PA-C 06/03/2023, 9:30 AM

## 2023-09-16 NOTE — Plan of Care (Signed)
  Problem: Education: Goal: Knowledge of General Education information will improve Description: Including pain rating scale, medication(s)/side effects and non-pharmacologic comfort measures Outcome: Not Progressing   Problem: Clinical Measurements: Goal: Ability to maintain clinical measurements within normal limits will improve Outcome: Not Progressing   Problem: Activity: Goal: Risk for activity intolerance will decrease Outcome: Not Progressing   Problem: Nutrition: Goal: Adequate nutrition will be maintained Outcome: Not Progressing   Problem: Education: Goal: Knowledge of General Education information will improve Description: Including pain rating scale, medication(s)/side effects and non-pharmacologic comfort measures Outcome: Not Progressing   Problem: Health Behavior/Discharge Planning: Goal: Ability to manage health-related needs will improve Outcome: Not Progressing   Problem: Self-Care: Goal: Ability to participate in self-care as condition permits will improve Outcome: Not Progressing   Problem: Nutrition: Goal: Dietary intake will improve Outcome: Not Progressing

## 2023-09-16 NOTE — Progress Notes (Signed)
 PROGRESS NOTE  Christopher Murphy ZOX:[id number] DOB: 1945-04-17 DOA: 08/28/2023 PCP: Carollynn Cirri, NP   LOS: 19 days   Brief Narrative / Interim history: 78 years old male with past medical history of vascular dementia, prior CVA with right-sided hemiplegia, hyperlipidemia, aortic aneurysm, was transferred from [facility] for altered mental status.  He has been admitted with acute encephalopathy and aphasia secondary to seizures noted on EEG on 08/29/23.  Neurology following: On antiepileptics as per neurology.  Mental status has not much improved.  Palliative care also consulted for ongoing goals of care.  Due to persistent encephalopathy and inability to eat he has been started on tube feeds via core track  Subjective / 24h Interval events: Remains unresponsive for me  Assesement and Plan: Principal Problem:   Encephalopathy Active Problems:   Decreased level of consciousness   Vertebral artery dissection (HCC)   Protein-calorie malnutrition, severe  Principal problem Acute metabolic encephalopathy, persistent, seizures -patient remains persistently encephalopathic, aphasic and unresponsive.  He does have a history of vascular dementia as well as prior CVA with right-sided hemiplegia.  Neurology consulted and following, MRI and EEG findings concerning for seizures, initially on Keppra  which was since discontinued to minimize sedation by neurology - Currently he is on amantadine  and remains poorly responsive - Inpatient neurology as well as outpatient neurologist Dr. Janett Medin, who has known patient for years has discussed extensively with the family that he is not in a position that he will ever recover from this.  Palliative care consulted, has had multiple discussions with the family, despite 0 recovery chance, daughter-in-law asked today to discuss with someone from IR regarding the possibility of a PEG tube placement and reevaluation from physical therapy - IR consulted, underwent scan of  the abdomen, IR to discuss with the family today  Active problems Right vertebral artery dissection-neurology recommends dual antiplatelet therapy for 90 days followed by Plavix   Fever, possible aspiration pneumonia-completed Unasyn  course.  Afebrile, respiratory status stable, no leukocytosis  Acute metabolic acidosis-resolved  Hypokalemia-improved, monitor intermittently  Hyponatremia-mild, overall stable, monitor intermittently  Anemia of chronic disease-monitor, no signs of bleeding  Aortic aneurysm-noted  Hypophosphatemia-continue to monitor  Severe malnutrition due to chronic illness-continue tube feeds.  Goals of care-ongoing discussions  Scheduled Meds:  amantadine   100 mg Per Tube BID   aspirin   325 mg Per Tube Daily   clopidogrel   75 mg Per Tube Daily   docusate  50 mg Per Tube BID   feeding supplement (PROSource TF20)  60 mL Per Tube Daily   glycopyrrolate   0.1 mg Intravenous Q8H   memantine   10 mg Per Tube BID   multivitamin with minerals  1 tablet Per Tube Daily   mouth rinse  15 mL Mouth Rinse 4 times per day   scopolamine   1 patch Transdermal Q72H   sennosides  5 mL Per Tube BID   sodium chloride  flush  3 mL Intravenous Q12H   Continuous Infusions:  feeding supplement (OSMOLITE 1.2 CAL) 60 mL/hr at 09/16/23 0658   PRN Meds:.acetaminophen , bisacodyl , hydrALAZINE , mouth rinse, polyethylene glycol  Current Outpatient Medications  Medication Instructions   acetaminophen  (TYLENOL ) 650 mg, Rectal, Every 4 hours PRN   aspirin  300 mg, Rectal, Daily   cefTRIAXone  2 g in sodium chloride  0.9 % 100 mL 2 g, Intravenous, Every 24 hours   enoxaparin  (LOVENOX ) 40 mg, Subcutaneous, Every 24 hours   Polyethyl Glycol-Propyl Glycol (SYSTANE) 0.4-0.3 % SOLN 1 drop, Both Eyes, 2 times daily   sodium  chloride 0.9 % infusion 50 mLs, Intravenous, Continuous    Diet Orders (From admission, onward)     Start     Ordered   08/28/23 1930  Diet NPO time specified  Diet  effective now        08/28/23 1931            DVT prophylaxis: Place and maintain sequential compression device Start: 08/28/23 2004   Lab Results  Component Value Date   PLT 475 (H) 09/14/2023      Code Status: Limited: Do not attempt resuscitation (DNR) -DNR-LIMITED -Do Not Intubate/DNI   Family Communication: No family at bedside  Status is: Inpatient Remains inpatient appropriate because: Severity of illness   Level of care: Med-Surg  Consultants:  Neurology Palliative care  Objective: Vitals:   09/15/23 1936 09/16/23 0020 09/16/23 0440 09/16/23 0811  BP: 103/75 120/67 112/71 113/72  Pulse: 80 91 86 84  Resp: 20 20 18 18   Temp: 98.5 F (36.9 C) 98 F (36.7 C) 98.1 F (36.7 C) 98.9 F (37.2 C)  TempSrc: Axillary Axillary Oral Oral  SpO2: 96% 95% 96% 95%  Weight:   74.4 kg   Height:        Intake/Output Summary (Last 24 hours) at 09/16/2023 1147 Last data filed at 09/16/2023 0900 Gross per 24 hour  Intake 1140 ml  Output 650 ml  Net 490 ml   Wt Readings from Last 3 Encounters:  09/16/23 74.4 kg  08/27/23 68.1 kg  07/15/23 66.7 kg    Examination: Constitutional: poorly responsive  Eyes: lids and conjunctivae normal, no scleral icterus ENMT: mmm Neck: normal, supple Respiratory: clear to auscultation bilaterally, no wheezing, no crackles.  Cardiovascular: Regular rate and rhythm, no murmurs / rubs / gallops. No LE edema. Abdomen: soft, no distention, no tenderness. Bowel sounds positive.   Data Reviewed: I have independently reviewed following labs and imaging studies   CBC Recent Labs  Lab 09/11/23 0353 09/12/23 0405 09/14/23 0407  WBC 10.2 8.6 8.4  HGB 11.7* 11.8* 12.5*  HCT 35.5* 36.1* 38.8*  PLT 410* 421* 475*  MCV 81.2 80.8 82.4  MCH 26.8 26.4 26.5  MCHC 33.0 32.7 32.2  RDW 14.6 14.6 14.5  LYMPHSABS 1.9  --   --   MONOABS 0.6  --   --   EOSABS 0.5  --   --   BASOSABS 0.1  --   --     Recent Labs  Lab 09/11/23 0353  09/12/23 0405 09/14/23 0407  NA 135 136 133*  K 4.1 4.1 4.2  CL 102 104 101  CO2 24 25 23   GLUCOSE 126* 115* 115*  BUN 22 22 21   CREATININE 0.83 0.78 0.86  CALCIUM  8.6* 8.7* 8.3*  AST 32 27 46*  ALT 50* 43 73*  ALKPHOS 65 70 78  BILITOT 0.4 0.5 0.4  ALBUMIN 2.4* 2.3* 2.5*  MG 2.2 2.2 2.3    ------------------------------------------------------------------------------------------------------------------ No results for input(s): "CHOL", "HDL", "LDLCALC", "TRIG", "CHOLHDL", "LDLDIRECT" in the last 72 hours.  Lab Results  Component Value Date   HGBA1C 5.5 06/26/2023   ------------------------------------------------------------------------------------------------------------------ No results for input(s): "TSH", "T4TOTAL", "T3FREE", "THYROIDAB" in the last 72 hours.  Invalid input(s): "FREET3"  Cardiac Enzymes No results for input(s): "CKMB", "TROPONINI", "MYOGLOBIN" in the last 168 hours.  Invalid input(s): "CK" ------------------------------------------------------------------------------------------------------------------ No results found for: "BNP"  CBG: Recent Labs  Lab 09/15/23 0031 09/15/23 0513 09/15/23 1217 09/16/23 0032 09/16/23 0606  GLUCAP 133* 109* 83 124* 113*  No results found for this or any previous visit (from the past 240 hours).   Radiology Studies: No results found.   Kathlen Para, MD, PhD [facility]  Between 7 am - 7 pm I am available, please contact me via Amion (for emergencies) or Securechat (non urgent messages)  Between 7 pm - 7 am I am not available, please contact night coverage MD/APP via Amion

## 2023-09-16 NOTE — Progress Notes (Signed)
 Physical Therapy Treatment Patient Details Name: Christopher Murphy MRN: [id number] DOB: 1946-01-08 Today's Date: 09/16/2023   History of Present Illness 78 years old male presented to [facility] ED 5/20 for altered mental status and admitted for acute encephalopathy. Transferred to [facility] 5/21 for EEG to evaluate for seizures. Rapidly worsening mentation, ultimately aphasic and unresponsive. PMH: vascular dementia, prior CVA with right-sided hemiplegia, hyperlipidemia, aortic aneurysm,    PT Comments  Pt lying in bed with his eyes open on entry. Mittens removed and pt able to mimic hand squeeze with bilateral UE. Performed minor UE ROM before attempting to get pt to EoB. Pt with increased retropulsion so returned to supine, and switched positioning to offload L hip. Pt with eyes closed at end of session. Pt with little progression today. D/c plan remains appropriately. PT will continue to follow once a week, prognosis today is not good.     If plan is discharge home, recommend the following: Two people to help with walking and/or transfers;Two people to help with bathing/dressing/bathroom;Assistance with feeding;Assistance with cooking/housework;Direct supervision/assist for medications management;Assist for transportation;Supervision due to cognitive status;Direct supervision/assist for financial management;Help with stairs or ramp for entrance   Can travel by private vehicle      Yes   Equipment Recommendations  Hospital bed;Hoyer lift       Precautions / Restrictions Precautions Precautions: Fall Recall of Precautions/Restrictions: Impaired Restrictions Weight Bearing Restrictions Per Provider Order: No     Mobility  Bed Mobility Overal bed mobility: Needs Assistance Bed Mobility: Supine to Sit, Sit to Supine     Supine to sit: Total assist, +2 for physical assistance, +2 for safety/equipment Sit to supine: Total assist, +2 for physical assistance, +2 for safety/equipment   General bed  mobility comments: total Ax2 for attempt to come to EoB, pt with retropulsion and grimace and return to supine.    Transfers                   General transfer comment: deferred    Ambulation/Gait               General Gait Details: unable         Balance Overall balance assessment: Needs assistance Sitting-balance support: Feet supported Sitting balance-Leahy Scale: Zero Sitting balance - Comments: unable to attain sitting balance                                    Communication Communication Communication: Impaired Factors Affecting Communication: Non - English speaking, interpreter not available;Other (comment) (non verbal during session)  Cognition Arousal: Alert Behavior During Therapy: Restless   PT - Cognitive impairments: History of cognitive impairments, No family/caregiver present to determine baseline                       PT - Cognition Comments: pt able to mimic hand squeeze Following commands: Impaired      Cueing Cueing Techniques: Verbal cues, Gestural cues, Tactile cues, Visual cues  Exercises Other Exercises Other Exercises: PROM B UEs: hand flex/ext, elbow flex/ext, shoulder FF    General Comments General comments (skin integrity, edema, etc.): VSS on RA      Pertinent Vitals/Pain Pain Assessment Pain Assessment: Faces Faces Pain Scale: Hurts even more Pain Location: generalized with movement to EoB Pain Descriptors / Indicators: Grimacing, Moaning Pain Intervention(s): Limited activity within patient's tolerance, Monitored during session, Repositioned  PT Goals (current goals can now be found in the care plan section) Acute Rehab PT Goals PT Goal Formulation: Patient unable to participate in goal setting Time For Goal Achievement: 09/23/23 Potential to Achieve Goals: Poor Progress towards PT goals: Not progressing toward goals - comment    Frequency    Min 1X/week       AM-PAC PT "6  Clicks" Mobility   Outcome Measure  Help needed turning from your back to your side while in a flat bed without using bedrails?: Total Help needed moving from lying on your back to sitting on the side of a flat bed without using bedrails?: Total Help needed moving to and from a bed to a chair (including a wheelchair)?: Total Help needed standing up from a chair using your arms (e.g., wheelchair or bedside chair)?: Total Help needed to walk in hospital room?: Total Help needed climbing 3-5 steps with a railing? : Total 6 Click Score: 6    End of Session   Activity Tolerance: Patient limited by fatigue Patient left: in bed;with call bell/phone within reach;with bed alarm set Nurse Communication: Mobility status PT Visit Diagnosis: Muscle weakness (generalized) (M62.81);Adult, failure to thrive (R62.7);Other symptoms and signs involving the nervous system (R29.898) Hemiplegia - Right/Left: Right Hemiplegia - dominant/non-dominant: Dominant Hemiplegia - caused by: Cerebral infarction     Time: 1521-1535 PT Time Calculation (min) (ACUTE ONLY): 14 min  Charges:    $Therapeutic Activity: 8-22 mins PT General Charges $$ ACUTE PT VISIT: 1 Visit                     Giuliano Preece B. Jewel Mortimer PT, DPT Acute Rehabilitation Services Please use secure chat or  Call Office [phone]    Verlie Glisson [facility] 09/16/2023, 4:26 PM

## 2023-09-16 NOTE — Progress Notes (Signed)
 Nutrition Follow-up  DOCUMENTATION CODES:   Severe malnutrition in context of chronic illness  INTERVENTION:   Continue TF via Cortrak: Osmolite 1.2 at 60 ml/h (1440 ml per day)  Prosource TF20 60 ml daily Provides 1808 kcal, 100 gm protein, 1166 ml free water daily MVI with minerals daily  NUTRITION DIAGNOSIS:   Severe Malnutrition related to chronic illness (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  Ongoing  GOAL:   Patient will meet greater than or equal to 90% of their needs  Met with TF  MONITOR:   Diet advancement, TF tolerance, I & O's, Labs, Weight trends  REASON FOR ASSESSMENT:   Consult Enteral/tube feeding initiation and management  ASSESSMENT:   Pt with PMH of vascular dementia, prior CVA with right-sided hemiplegia, HLD, aortic aneurysm admitted with acute encephalopathy.  6/4- s/p BSE- NPO 6/6- s/p BSE- NPO  Reviewed I/O's: +1.1 L x 24 hours and +7.1 L since 09/02/23  UOP: 250 ml x 24 hours  Pt unavailable at time of visit. Attempted to speak with pt via call to hospital room phone, however, unable to reach. RD unable to obtain further nutrition-related history or complete nutrition-focused physical exam at this time.    Per IR notes, pt no longer wishes to proceed with PEG placement. Per neurology notes, holding aspirin  and plavix  for 5 days for PEG placement would place pt at increased risk of recurrent severe stroke.   Case discussed with RN, who confirms pt is NPO and TF tolerating TF well. Osmolite 1.2 continues to infuse at goal rate of 60 ml/hr via cortrak.   Wt has been stable over the past week  Medications reviewed and include colace and senokot.   TOC following for discharge disposition.   Labs reviewed: Na: 133, CBGS: 83-134 (inpatient orders for glycemic control are none).    Diet Order:   Diet Order             Diet NPO time specified  Diet effective now                    EDUCATION NEEDS:   Not appropriate for education at this time  Skin:  Skin Assessment: Reviewed RN Assessment  Last BM:  09/15/23 (type 4)  Height:   Ht Readings from Last 1 Encounters:  09/05/23 5\' 10"  (1.778 m)    Weight:   Wt Readings from Last 1 Encounters:  09/16/23 74.4 kg   BMI:  Body mass index is 23.53 kg/m.  Estimated Nutritional Needs:   Kcal:  1700-1900  Protein:  85-100 grams  Fluid:  > 1.7 L/day    Herschel Lords, RD, LDN, CDCES Registered Dietitian III Certified Diabetes Care and Education Specialist If unable to reach this RD, please use "RD Inpatient" group chat on secure chat between hours of 8am-4 pm daily

## 2023-09-16 NOTE — Progress Notes (Signed)
 Called to room by patients son who states he thought his dad was attempting to speak with him. Son stated patients words were "jiberish", but seemed like he was trying to communicate with him. I attempted to get patient to respond to me verbally-which he did not, I attempted to get patient to squeeze my hands-which he did not, however he did squeeze his sons fingers voluntarily on command-it was very weak, but I did witness him do this 2 to 3 times. I had son speak to patient in native language and ask him to squeeze my fingers-pt did not but as I held patients arm up in the air, I could feel him trying to pull arm back. Son requesting above be noted in chart.

## 2023-09-17 DIAGNOSIS — R1319 Other dysphagia: Secondary | ICD-10-CM

## 2023-09-17 DIAGNOSIS — R627 Adult failure to thrive: Secondary | ICD-10-CM

## 2023-09-17 LAB — GLUCOSE, CAPILLARY
Glucose-Capillary: 122 mg/dL — ABNORMAL HIGH (ref 70–99)
Glucose-Capillary: 103 mg/dL — ABNORMAL HIGH (ref 70–99)
Glucose-Capillary: 131 mg/dL — ABNORMAL HIGH (ref 70–99)
Glucose-Capillary: 159 mg/dL — ABNORMAL HIGH (ref 70–99)
Glucose-Capillary: 153 mg/dL — ABNORMAL HIGH (ref 70–99)

## 2023-09-17 NOTE — Progress Notes (Signed)
 Occupational Therapy Treatment Patient Details Name: Christopher Murphy MRN: [id number] DOB: 1945/11/06 Today's Date: 09/17/2023   History of present illness 78 years old male presented to [facility] ED 5/20 for altered mental status and admitted for acute encephalopathy. Transferred to [facility] 5/21 for EEG to evaluate for seizures. Rapidly worsening mentation, ultimately aphasic and unresponsive. PMH: vascular dementia, prior CVA with right-sided hemiplegia, hyperlipidemia, aortic aneurysm,   OT comments  Pt with eyes open upon OT arrival. Mittens removed pt tolerated B UE PROM. Total A for rolling in bed in prep for initiating sup-sit however pt with grimacing/moaning and retropulsion so returned to supine. Pt able to squeeze therapist hand 1/4 trials with L hand , attempted to wipe face with washcloth on his own once initiated total hand over hand assist. Pt with eyes closed at end of session. OT to continue to follow once a week      If plan is discharge home, recommend the following:  Two people to help with walking and/or transfers;Two people to help with bathing/dressing/bathroom;Supervision due to cognitive status   Equipment Recommendations  Hospital bed;Hoyer lift    Recommendations for Other Services      Precautions / Restrictions Precautions Precautions: Fall Recall of Precautions/Restrictions: Impaired Restrictions Weight Bearing Restrictions Per Provider Order: No       Mobility Bed Mobility Overal bed mobility: Needs Assistance Bed Mobility: Rolling Rolling: Total assist, +2 for physical assistance, +2 for safety/equipment         General bed mobility comments: unable to attempt sup - sit due to grimacing and retropulsion once initiated    Transfers                   General transfer comment: deferred     Balance Overall balance assessment: Needs assistance   Sitting balance-Leahy Scale: Zero Sitting balance - Comments: unable to attain sitting  balance Postural control: Posterior lean                                 ADL either performed or assessed with clinical judgement   ADL                                         General ADL Comments: TOTAL A for all ADLs, pt attempted to wipe face with washcloth on his own once initiated total hand over hand assist    Extremity/Trunk Assessment Upper Extremity Assessment Upper Extremity Assessment: Generalized weakness RUE Deficits / Details: baseline CVA deficits; pt will squeeze therapist hand with L hand 0/4 trials LUE Deficits / Details: pt will squeeze therapist hand 1/4 trials, attempted to wipe face with washcloth on his own once initiated total hand over hand assist            Vision   Additional Comments: eyes opened at start of session, eyes closed at end of session   Perception     Praxis     Communication Communication Communication: Impaired Factors Affecting Communication: Non - English speaking, interpreter not available;Other (comment) (pt non verbal during session)   Cognition Arousal: Alert Behavior During Therapy: Restless                                 Following commands: Impaired Following commands  impaired: Follows one step commands inconsistently      Cueing   Cueing Techniques: Verbal cues, Gestural cues, Tactile cues, Visual cues  Exercises      Shoulder Instructions       General Comments      Pertinent Vitals/ Pain       Pain Assessment Pain Assessment: Faces Faces Pain Scale: Hurts little more Pain Location: generalized PROM B shoulders initially Pain Descriptors / Indicators: Grimacing, Moaning Pain Intervention(s): Monitored during session, Repositioned  Home Living                                          Prior Functioning/Environment              Frequency  Min 1X/week        Progress Toward Goals  OT Goals(current goals can now be found in the  care plan section)  Progress towards OT goals: OT to reassess next treatment     Plan      Co-evaluation                 AM-PAC OT "6 Clicks" Daily Activity     Outcome Measure   Help from another person eating meals?: Total Help from another person taking care of personal grooming?: Total Help from another person toileting, which includes using toliet, bedpan, or urinal?: Total Help from another person bathing (including washing, rinsing, drying)?: Total Help from another person to put on and taking off regular upper body clothing?: Total Help from another person to put on and taking off regular lower body clothing?: Total 6 Click Score: 6    End of Session    OT Visit Diagnosis: Unsteadiness on feet (R26.81);Other abnormalities of gait and mobility (R26.89);Muscle weakness (generalized) (M62.81)   Activity Tolerance Patient limited by fatigue   Patient Left in bed;with call bell/phone within reach;with bed alarm set   Nurse Communication Mobility status        Time: 7846-9629 OT Time Calculation (min): 16 min  Charges: OT General Charges $OT Visit: 1 Visit OT Treatments $Therapeutic Activity: 8-22 mins   Alfred Ann 09/17/2023, 2:59 PM

## 2023-09-17 NOTE — Progress Notes (Addendum)
 PROGRESS NOTE  Christopher Murphy AOZ:[id number] DOB: [birth year] DOA: 08/28/2023 PCP: Carollynn Cirri, NP   LOS: 20 days   Brief Narrative / Interim history: 78 years old male with past medical history of vascular dementia, prior CVA with right-sided hemiplegia, hyperlipidemia, aortic aneurysm, was transferred from [facility] for altered mental status.  He has been admitted with acute encephalopathy and aphasia secondary to seizures noted on EEG on 08/29/23.  Neurology following: On antiepileptics as per neurology.  Mental status has not much improved.  Palliative care also consulted for ongoing goals of care.  Due to persistent encephalopathy and inability to eat he has been started on tube feeds via core track  Subjective / 24h Interval events: Remains unresponsive  Assesement and Plan: Principal Problem:   Encephalopathy Active Problems:   Decreased level of consciousness   Vertebral artery dissection (HCC)   Protein-calorie malnutrition, severe  Principal problem Acute metabolic encephalopathy, persistent, seizures -patient remains persistently encephalopathic, aphasic and unresponsive.  He does have a history of vascular dementia as well as prior CVA with right-sided hemiplegia.  Neurology consulted and following, MRI and EEG findings concerning for seizures, initially on Keppra  which was since discontinued to minimize sedation by neurology - Currently he is on amantadine  and remains poorly responsive - Inpatient neurology as well as outpatient neurologist Dr. Janett Medin, who has known patient for years has discussed extensively with the family that he is not in a position that he will ever recover from this.  Palliative care consulted, has had multiple discussions with the family, despite 0 recovery chance, daughter-in-law asked to discuss with IR regarding PEG tube.  After discussion yesterday, per IR family decided against PEG tube - Palliative and I followed again today with the family, now  thinking about discussing with IR again but daughter in law again undecided today.   Active problems Right vertebral artery dissection-neurology recommends dual antiplatelet therapy for 90 days followed by Plavix   Fever, possible aspiration pneumonia-completed Unasyn  course.  Afebrile, respiratory status stable, no leukocytosis  Acute metabolic acidosis-resolved  Hypokalemia-improved, monitor intermittently  Hyponatremia-mild, overall stable, monitor intermittently  Anemia of chronic disease-monitor, no signs of bleeding  Aortic aneurysm-noted  Hypophosphatemia-continue to monitor  Severe malnutrition due to chronic illness-continue tube feeds.  Goals of care-ongoing discussions  Scheduled Meds:  amantadine   100 mg Per Tube BID   aspirin   325 mg Per Tube Daily   clopidogrel   75 mg Per Tube Daily   docusate  50 mg Per Tube BID   feeding supplement (PROSource TF20)  60 mL Per Tube Daily   glycopyrrolate   0.1 mg Intravenous Q8H   memantine   10 mg Per Tube BID   multivitamin with minerals  1 tablet Per Tube Daily   mouth rinse  15 mL Mouth Rinse 4 times per day   scopolamine   1 patch Transdermal Q72H   sennosides  5 mL Per Tube BID   sodium chloride  flush  3 mL Intravenous Q12H   Continuous Infusions:  feeding supplement (OSMOLITE 1.2 CAL) 60 mL/hr at 09/17/23 0129   PRN Meds:.acetaminophen , bisacodyl , hydrALAZINE , mouth rinse, polyethylene glycol  Current Outpatient Medications  Medication Instructions   acetaminophen  (TYLENOL ) 650 mg, Rectal, Every 4 hours PRN   aspirin  300 mg, Rectal, Daily   cefTRIAXone  2 g in sodium chloride  0.9 % 100 mL 2 g, Intravenous, Every 24 hours   enoxaparin  (LOVENOX ) 40 mg, Subcutaneous, Every 24 hours   Polyethyl Glycol-Propyl Glycol (SYSTANE) 0.4-0.3 % SOLN 1 drop, Both Eyes,  2 times daily   sodium chloride  0.9 % infusion 50 mLs, Intravenous, Continuous    Diet Orders (From admission, onward)     Start     Ordered   08/28/23 1930   Diet NPO time specified  Diet effective now        08/28/23 1931            DVT prophylaxis: Place and maintain sequential compression device Start: 08/28/23 2004   Lab Results  Component Value Date   PLT 475 (H) 09/14/2023      Code Status: Limited: Do not attempt resuscitation (DNR) -DNR-LIMITED -Do Not Intubate/DNI   Family Communication: No family at bedside  Status is: Inpatient Remains inpatient appropriate because: Severity of illness   Level of care: Med-Surg  Consultants:  Neurology Palliative care  Objective: Vitals:   09/16/23 1912 09/17/23 0019 09/17/23 0428 09/17/23 0751  BP: 121/74 117/71 112/66 113/65  Pulse: (!) 103 (!) 102 94 96  Resp: 18 18  17   Temp: 98.2 F (36.8 C) 98.6 F (37 C) 98.2 F (36.8 C) 98.5 F (36.9 C)  TempSrc: Axillary Oral Axillary Oral  SpO2: 96% 96% 95% 97%  Weight:   (!) 170 kg   Height:        Intake/Output Summary (Last 24 hours) at 09/17/2023 0953 Last data filed at 09/17/2023 0910 Gross per 24 hour  Intake 1111 ml  Output 600 ml  Net 511 ml   Wt Readings from Last 3 Encounters:  09/17/23 (!) 170 kg  08/27/23 68.1 kg  07/15/23 66.7 kg    Examination: Constitutional: unresponsive Eyes: lids and conjunctivae normal, no scleral icterus ENMT: mmm Neck: normal, supple Respiratory: clear to auscultation bilaterally, no wheezing, no crackles. Normal respiratory effort.  Cardiovascular: Regular rate and rhythm, no murmurs / rubs / gallops. No LE edema. Abdomen: soft, no distention, no tenderness. Bowel sounds positive.   Data Reviewed: I have independently reviewed following labs and imaging studies   CBC Recent Labs  Lab 09/11/23 0353 09/12/23 0405 09/14/23 0407  WBC 10.2 8.6 8.4  HGB 11.7* 11.8* 12.5*  HCT 35.5* 36.1* 38.8*  PLT 410* 421* 475*  MCV 81.2 80.8 82.4  MCH 26.8 26.4 26.5  MCHC 33.0 32.7 32.2  RDW 14.6 14.6 14.5  LYMPHSABS 1.9  --   --   MONOABS 0.6  --   --   EOSABS 0.5  --   --    BASOSABS 0.1  --   --     Recent Labs  Lab 09/11/23 0353 09/12/23 0405 09/14/23 0407  NA 135 136 133*  K 4.1 4.1 4.2  CL 102 104 101  CO2 24 25 23   GLUCOSE 126* 115* 115*  BUN 22 22 21   CREATININE 0.83 0.78 0.86  CALCIUM  8.6* 8.7* 8.3*  AST 32 27 46*  ALT 50* 43 73*  ALKPHOS 65 70 78  BILITOT 0.4 0.5 0.4  ALBUMIN 2.4* 2.3* 2.5*  MG 2.2 2.2 2.3    ------------------------------------------------------------------------------------------------------------------ No results for input(s): "CHOL", "HDL", "LDLCALC", "TRIG", "CHOLHDL", "LDLDIRECT" in the last 72 hours.  Lab Results  Component Value Date   HGBA1C 5.5 06/26/2023   ------------------------------------------------------------------------------------------------------------------ No results for input(s): "TSH", "T4TOTAL", "T3FREE", "THYROIDAB" in the last 72 hours.  Invalid input(s): "FREET3"  Cardiac Enzymes No results for input(s): "CKMB", "TROPONINI", "MYOGLOBIN" in the last 168 hours.  Invalid input(s): "CK" ------------------------------------------------------------------------------------------------------------------ No results found for: "BNP"  CBG: Recent Labs  Lab 09/16/23 0606 09/16/23 1219 09/16/23 1847 09/17/23  0555 09/17/23 0751  GLUCAP 113* 119* 126* 122* 103*    No results found for this or any previous visit (from the past 240 hours).   Radiology Studies: No results found.   Kathlen Para, MD, PhD [facility]  Between 7 am - 7 pm I am available, please contact me via Amion (for emergencies) or Securechat (non urgent messages)  Between 7 pm - 7 am I am not available, please contact night coverage MD/APP via Amion

## 2023-09-17 NOTE — Plan of Care (Signed)
  Problem: Clinical Measurements: Goal: Ability to maintain clinical measurements within normal limits will improve Outcome: Progressing   Problem: Pain Managment: Goal: General experience of comfort will improve and/or be controlled Outcome: Progressing   Problem: Coping: Goal: Level of anxiety will decrease Outcome: Completed/Met   Problem: Elimination: Goal: Will not experience complications related to urinary retention Outcome: Completed/Met

## 2023-09-17 NOTE — Plan of Care (Signed)
    Progress Note from the Palliative Medicine Team at [facility]   Patient Name: Christopher Murphy        Date: 09/17/2023 DOB: March 08, 1946  Age: 78 y.o. MRN#: [id number] Attending Physician: Osborn Blaze, MD Primary Care Physician: Carollynn Cirri, NP Admit Date: 08/28/2023  Shelah Derry by room multiple times to speak with family today.  No family at bedside.  Left phone message for son, await callback   No charge   [name] NP  Palliative Medicine Team Team Phone # [phone] Pager [phone]

## 2023-09-17 NOTE — TOC Progression Note (Signed)
 Transition of Care [facility]) - Progression Note    Patient Details  Name: Christopher Murphy MRN: [id number] Date of Birth: 1945/09/16  Transition of Care [facility]) CM/SW Contact  Graves-Bigelow, Jari Merles, RN Phone Number: 09/17/2023, 12:29 PM  Clinical Narrative: Patient was discussed in progression rounds this am. Palliative continues to follow the patient and plans to hopefully speak with family. Case Manager will continue to follow for additional needs.   Expected Discharge Plan:  (TBD) Barriers to Discharge: Continued Medical Work up  Expected Discharge Plan and Services In-house Referral: Clinical Social Work Discharge Planning Services: CM Consult   Living arrangements for the past 2 months: Single Family Home  Social Determinants of Health (SDOH) Interventions SDOH Screenings   Food Insecurity: No Food Insecurity (08/29/2023)  Housing: Low Risk  (08/29/2023)  Transportation Needs: No Transportation Needs (08/29/2023)  Utilities: Not At Risk (08/29/2023)  Depression (PHQ2-9): Low Risk  (07/15/2023)  Financial Resource Strain: Low Risk  (06/26/2023)  Physical Activity: Insufficiently Active (06/26/2023)  Social Connections: Unknown (08/29/2023)  Stress: No Stress Concern Present (06/26/2023)  Tobacco Use: Low Risk  (09/05/2023)    Readmission Risk Interventions     No data to display

## 2023-09-17 NOTE — Progress Notes (Signed)
 Patient ID: Christopher Murphy, male   DOB: May 14, 1945, 78 y.o.   MRN: [id number]    Progress Note from the Palliative Medicine Team at [facility]   Patient Name: Christopher Murphy        Date: 09/17/2023 DOB: [birth year]  Age: 78 y.o. MRN#: [id number] Attending Physician: Osborn Blaze, MD Primary Care Physician: Carollynn Cirri, NP Admit Date: 08/28/2023   Reason for Consultation/Follow-up   Establishing Goals of Care   HPI/ Brief Hospital Review 78 y.o. male   admitted on 08/28/2023 with past medical history of  vascular dementia, prior CVA with right-sided hemiplegia, HLD, aortic aneurysm, who is transferred from [facility] for altered mental status.   Admitted with acute encephalopathy and aphasia -neurology consulted     Long-term EEG with no seizure activity, remains on antiepileptics per neurology.      Patient remains minimally responsive, unable to follow commands.  No change in mental status.       Family face treatment option decisions, advanced directive decisions and anticipatory care needs.    Subjective  Extensive chart review has been completed prior to meeting with patient/family  including labs, vital signs, imaging, progress/consult notes, orders, medications and available advance directive documents.    This NP assessed patient at the bedside as a follow up for palliative medicine needs and emotional support.  DIL and wife at bedside   Family continue to struggle with EOL decisions.   DIL is showing me a video where patient is mumbling words, family wonders if this is meaningful communication.     Ongoing detailed education offered on current medical situation and patient's continued altered mental status, inability to follow commands, overall failure to thrive.  Education/discussion on human mortality and limitations of medical interventions to prolong quality of life when a body fails to thrive.   Wife was able to verbalize her concern for  suffering.     The difference between a full medical support path attempting to prolong life verses a comfort path focusing on comfort and dignity allowing for a natural death was detailed.    Ongoing detailed education regarding risk and benefits of artifical feeding/PEG tube.. Discussed continued risk of aspiration with or without PEG.  Education offered on hospice benefit; philosophy and eligibility .     Education offered today regarding  the importance of continued conversation with family and their  medical providers regarding overall plan of care and treatment options,  ensuring decisions are within the context of the patients values and GOCs.  Family will continue to talk as a whole as they make decisions.for Christopher Murphy  Questions and concerns addressed     Emotional support.  PMT will be available for f/u   Discussed with Dr Ned Balint, and nursing staff   Time: 40   minutes       Detailed review of medical records ( labs, imaging, vital signs), medically appropriate exam ( MS, skin, cardiac,  resp)   discussed with treatment team, counseling and education to patient, family, staff, documenting clinical information, medication management, coordination of care    [name] NP  Palliative Medicine Team Team Phone # [phone] Pager [phone]

## 2023-09-17 NOTE — Plan of Care (Signed)
   Problem: Education: Goal: Knowledge of General Education information will improve Description: Including pain rating scale, medication(s)/side effects and non-pharmacologic comfort measures Outcome: Not Progressing   Problem: Activity: Goal: Risk for activity intolerance will decrease Outcome: Not Progressing   Problem: Nutrition: Goal: Adequate nutrition will be maintained Outcome: Not Progressing

## 2023-09-18 DIAGNOSIS — R638 Other symptoms and signs concerning food and fluid intake: Secondary | ICD-10-CM

## 2023-09-18 DIAGNOSIS — E43 Unspecified severe protein-calorie malnutrition: Secondary | ICD-10-CM

## 2023-09-18 LAB — GLUCOSE, CAPILLARY
Glucose-Capillary: 166 mg/dL — ABNORMAL HIGH (ref 70–99)
Glucose-Capillary: 170 mg/dL — ABNORMAL HIGH (ref 70–99)

## 2023-09-18 MED ORDER — MORPHINE SULFATE (PF) 2 MG/ML IV SOLN
1.0000 mg | INTRAVENOUS | Status: DC | PRN
  Administered 2023-09-18 – 2023-09-20 (×10): 1 mg via INTRAVENOUS
  Filled 2023-09-18 (×10): qty 1

## 2023-09-18 MED ORDER — GLYCOPYRROLATE 0.2 MG/ML IJ SOLN
0.4000 mg | Freq: Three times a day (TID) | INTRAMUSCULAR | Status: DC
  Administered 2023-09-18 – 2023-09-19 (×3): 0.4 mg via INTRAVENOUS
  Filled 2023-09-18 (×4): qty 2

## 2023-09-18 MED ORDER — LORAZEPAM 2 MG/ML IJ SOLN: 0.5000 mg | INTRAMUSCULAR | Status: DC | PRN

## 2023-09-18 NOTE — Progress Notes (Signed)
  Progress Note   Patient: Christopher Murphy NWG:[id number] DOB: 02-13-1946 DOA: 08/28/2023     21 DOS: the patient was seen and examined on 09/18/2023 at 1:15PM      Brief hospital course: 78 y.o. M with CVA with residual R hemiplegia and dementia, HLD who presented with inability to talk.  Transferred from [facility] for Neurology consultation and cEEG which confirmed seizures.       Assessment and Plan: Acute metabolic encephalopathy due to status epilepticus Right vertebral artery dissection Possible aspiration pneumonia Acute metabolic acidosis Hypokalemia Hyponatremia Anemia of chronic disease Aortic aneurysm Hypophosphatemia Severe protein calorie malnutrition  The patient was admitted and transferred for neurology consultation and EEG.  EEG showed status epilepticus, which was extinguished with Keppra .  The patient's hospital stay was complicated by finding of vertebral artery dissection, and likely aspiration pneumonia.  However despite aggressive treatment, neuro stimulants/amantadine , supportive IV fluids and time, the patient had no meaningful neurological functional recovery.  He was evaluated by the attending inpatient neurology specialist from the stroke service, epilepsy service, and general neurology service, all 3 of home agreed that in the setting of his longstanding severe vascular dementia, poor functional status prior to admission, and presenting illness, there is no reasonable expectation for meaningful recovery of cognitive or motor function, and hospice was recommended.          Subjective: Low grade fever overnight. No apparent discomfort or distress.       Physical Exam: BP 109/70 (BP Location: Left Arm)   Pulse (!) 125   Temp (!) 100.6 F (38.1 C) (Axillary)   Resp 19   Ht 5' 10 (1.778 m)   Wt 76.2 kg   SpO2 97%   BMI 24.11 kg/m   Frail elderly adult male, lying in bed, unresponsive Tachycardic, irregular, no murmurs, no peripheral  pitting Respiratory normal, lungs diminished Abdomen soft, no distention, no grimace to palpation, no rigidity He has contractures in all 4 extremities, he makes no spontaneous movements, he makes some spontaneous mumbling, but no intelligible utterances and no following commands     Data Reviewed: Glucose normal  Family Communication: Daughter at bedside, wife at bedside, son by phone    Disposition: Status is: Inpatient         Author: Ephriam Hashimoto, MD 09/18/2023 4:53 PM  For on call review [email or web address].

## 2023-09-18 NOTE — Progress Notes (Signed)
 Patient ID: Christopher Murphy, male   DOB: 1945/05/15, 78 y.o.   MRN: [id number]    Progress Note from the Palliative Medicine Team at [facility]   Patient Name: Christopher Murphy        Date: 09/18/2023 DOB: 1945-08-07  Age: 78 y.o. MRN#: [id number] Attending Physician: Ephriam Hashimoto, * Primary Care Physician: Carollynn Cirri, NP Admit Date: 08/28/2023   Reason for Consultation/Follow-up   Establishing Goals of Care   HPI/ Brief Hospital Review  78 y.o. male   admitted on 08/28/2023 with past medical history of  vascular dementia, prior CVA with right-sided hemiplegia, HLD, aortic aneurysm, who is transferred from [facility] for altered mental status.   Admitted with acute encephalopathy and aphasia -neurology consulted     Long-term EEG with no seizure activity, remains on antiepileptics per neurology.     Patient remains minimally responsive, unable to follow commands.  No change in mental status.     Family face treatment option decisions, advanced directive decisions and anticipatory care needs.    Subjective  Extensive chart review has been completed prior to meeting with patient/family  including labs, vital signs, imaging, progress/consult notes, orders, medications and available advance directive documents.    This NP assessed patient at the bedside as a follow up for palliative medicine needs and emotional support.  DIL and wife at bedside   Family continue to struggle with EOL decisions.    Daughter-in-law tells me today that as a family they have made a decision to forego life-prolonging measures, shift to a full comfort path, allowing for natural death.  Emotional support offered  Education offered on the difference between a full medical support path attempting to prolong life versus,  a comfort/dignity path focusing on comfort and allowing a natural death.  Plan of care - DNR/DNI - No artificial feeding or hydration now or in the future-DC core  track - No further diagnostics; labs, scans - Utilize medication to treat symptoms to enhance comfort     -/Pain/ dyspnea-morphine  1 mg IV every 3 hours as needed     - Anxiety/agitation-Ativan  0.5 mg IV every 4 hours as needed     - Robinul /scopolamine  patch-terminal secretions - [facility] is for inpatient hospice facility for end-of-life care-family requesting [facility], will place referral - Prognosis is likely days to a week   Education offered on hospice benefit; philosophy and eligibility specific to inpatient facility  Education offered on the natural trajectory and expectations at end-of-life    Education offered today regarding  the importance of continued conversation with family and their  medical providers regarding overall plan of care and treatment options,  ensuring decisions are within the context of the patients values and GOCs.  Questions and concerns addressed     Emotional support.  PMT will be available for f/u   Discussed with Dr Darlyn Eke, and nursing staff   Time: 60   minutes       Detailed review of medical records ( labs, imaging, vital signs), medically appropriate exam ( MS, skin, cardiac,  resp)   discussed with treatment team, counseling and education to patient, family, staff, documenting clinical information, medication management, coordination of care    [name] NP  Palliative Medicine Team Team Phone # [phone] Pager [phone]

## 2023-09-18 NOTE — TOC Progression Note (Signed)
 Transition of Care [facility]) - Progression Note    Patient Details  Name: Christopher Murphy MRN: [id number] Date of Birth: [birth year]  [facility]) CM/SW Contact  Carmon Christen, LCSWA Phone Number: 09/18/2023, 11:15 AM  Clinical Narrative:     CSW received consult referral for residential hospice for patient. CSW spoke with patients daughter-n-law Charanpreet who gave permission for CSW to make referral to [facility] for hospice home in [place]. CSW made referral to Shawn P. With [facility]. CSW will continue to follow.  Expected Discharge Plan:  (TBD) Barriers to Discharge: Continued Medical Work up  Expected Discharge Plan and Services In-house Referral: Clinical Social Work Discharge Planning Services: CM Consult   Living arrangements for the past 2 months: Single Family Home                                       Social Determinants of Health (SDOH) Interventions SDOH Screenings   Food Insecurity: No Food Insecurity (08/29/2023)  Housing: Low Risk  (08/29/2023)  Transportation Needs: No Transportation Needs (08/29/2023)  Utilities: Not At Risk (08/29/2023)  Depression (PHQ2-9): Low Risk  (07/15/2023)  Financial Resource Strain: Low Risk  (06/26/2023)  Physical Activity: Insufficiently Active (06/26/2023)  Social Connections: Unknown (08/29/2023)  Stress: No Stress Concern Present (06/26/2023)  Tobacco Use: Low Risk  (09/05/2023)    Readmission Risk Interventions     No data to display

## 2023-09-19 DIAGNOSIS — E871 Hypo-osmolality and hyponatremia: Secondary | ICD-10-CM | POA: Insufficient documentation

## 2023-09-19 DIAGNOSIS — E8721 Acute metabolic acidosis: Secondary | ICD-10-CM | POA: Insufficient documentation

## 2023-09-19 DIAGNOSIS — E876 Hypokalemia: Secondary | ICD-10-CM | POA: Insufficient documentation

## 2023-09-19 NOTE — Progress Notes (Signed)
 Preparing pt for transfer, daughter in law at bedside, oral care performed/suctioned, pain med given (see emar). Family understanding pt will be transferred.

## 2023-09-19 NOTE — Progress Notes (Signed)
 Nutrition Brief Note  Chart reviewed. Pt now transitioning to comfort care.  No further nutrition interventions planned at this time.  Please re-consult as needed.   Drusilla Kanner, RDN, LDN Clinical Nutrition See AMiON for contact information.

## 2023-09-19 NOTE — TOC Progression Note (Addendum)
 Transition of Care [facility]) - Progression Note    Patient Details  Name: Christopher Murphy MRN: [id number] Date of Birth: Feb 06, 1946  Transition of [facility]) CM/SW Contact  Carmon Christen, LCSWA Phone Number: 09/19/2023, 11:36 AM  Clinical Narrative:     Shawn with [facility] informed medical team that he spoke with the daughter in law  and at this time she does not want to proceed with inpatient hospice approval. She reports they would like to not move him, provide comfort care here, and expect a hospital death. TOC will continue to follow.   Expected Discharge Plan:  (TBD) Barriers to Discharge: Continued Medical Work up  Expected Discharge Plan and Services In-house Referral: Clinical Social Work Discharge Planning Services: CM Consult   Living arrangements for the past 2 months: Single Family Home                                       Social Determinants of Health (SDOH) Interventions SDOH Screenings   Food Insecurity: No Food Insecurity (08/29/2023)  Housing: Low Risk  (08/29/2023)  Transportation Needs: No Transportation Needs (08/29/2023)  Utilities: Not At Risk (08/29/2023)  Depression (PHQ2-9): Low Risk  (07/15/2023)  Financial Resource Strain: Low Risk  (06/26/2023)  Physical Activity: Insufficiently Active (06/26/2023)  Social Connections: Unknown (08/29/2023)  Stress: No Stress Concern Present (06/26/2023)  Tobacco Use: Low Risk  (09/05/2023)    Readmission Risk Interventions     No data to display

## 2023-09-19 NOTE — Progress Notes (Signed)
  Progress Note   Patient: Christopher Murphy UEA:[id number] DOB: [birth year] DOA: 08/28/2023     22 DOS: the patient was seen and examined on 09/19/2023 at 9:26AM      Brief hospital course: 78 y.o. M with CVA with residual R hemiplegia and dementia, HLD who presented with inability to talk.  Transferred from [facility] for Neurology consultation and cEEG which confirmed seizures.       Assessment and Plan:  Principal Problem:   Acute metabolic encephalopathy due to status epilepticus Active Problems:   Vascular dementia [facility])   Thoracic aortic aneurysm (HCC)   Iron deficiency anemia secondary to inadequate dietary iron intake   Hemiparesis affecting right side as late effect of cerebrovascular accident (CVA) (HCC)   Cerebrovascular disease   Right vertebral artery dissection (HCC)   Protein-calorie malnutrition, severe   Acute metabolic acidosis   Hypophosphatemia   Hyponatremia   Hypokalemia   The patient was admitted and transferred for neurology consultation and EEG.  EEG showed status epilepticus, which was extinguished with Keppra .  The patient's hospital stay was complicated by finding of vertebral artery dissection, and likely aspiration pneumonia.   However despite aggressive treatment, neuro stimulants/amantadine , supportive IV fluids and time, the patient had no meaningful neurological functional recovery.  He was evaluated by the attending inpatient neurology specialist from the stroke service, epilepsy service, and general neurology service, all 3 of home agreed that in the setting of his longstanding severe vascular dementia, poor functional status prior to admission, and presenting illness, there is no reasonable expectation for meaningful recovery of cognitive or motor function, and hospice was recommended.   - Full comfort measures - Morphine  for pain - Lorazepam  for anxiety          Subjective: No change     Physical Exam: BP 102/66 (BP Location:  Right Arm)   Pulse (!) 125   Temp 99.3 F (37.4 C) (Oral)   Resp 19   Ht 5' 10 (1.778 m)   Wt 76.2 kg   SpO2 98%   BMI 24.11 kg/m   Frail elderly adult male, limited response to movement Tachycardic, regular, no edema Respiratory rate high normal, lungs diminished No purposeful responses to movement, contractures noted.      Family Communication: Daughter at bedside    Disposition: Status is: Inpatient         Author: Ephriam Hashimoto, MD 09/19/2023 2:30 PM  For on call review [email or web address].

## 2023-09-19 NOTE — Progress Notes (Signed)
 Report called to [employer], pt to be transferred.

## 2023-09-20 DIAGNOSIS — G9341 Metabolic encephalopathy: Secondary | ICD-10-CM

## 2023-10-08 NOTE — Death Summary Note (Signed)
 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 M with CVA with residual R hemiplegia and dementia, HLD who presented with inability to talk.  Transferred from [facility] for Neurology consultation and cEEG which confirmed seizures.      The patient was admitted and transferred for neurology consultation and EEG.  EEG showed status epilepticus, which was extinguished with Keppra .  The patient's hospital stay was complicated by finding of vertebral artery dissection, and likely aspiration pneumonia.   However despite aggressive treatment, neuro stimulants/amantadine , supportive IV fluids and time, the patient had no meaningful neurological functional recovery.  He was evaluated by the attending inpatient neurology specialist from the epilepsy service and general neurology service who consulted with his primary neurologist, who is chief of the Stroke service, and there was broad consensus that his functional decline from this illness was terminal.  In discussion with family, focus shifted to comfort measures, and he passed away in  the hospital with family at bedside.         The results of significant diagnostics from this hospitalization (including imaging, microbiology, ancillary and laboratory) are listed below for reference.   Significant Diagnostic Studies: CT ABDOMEN WO CONTRAST Result Date: 09/15/2023 CLINICAL DATA:  Dysphagia. Evaluate anatomy for possible percutaneous gastrostomy tube placement. EXAM: CT ABDOMEN WITHOUT CONTRAST TECHNIQUE: Multidetector CT imaging of the abdomen was performed following the standard protocol without IV contrast. RADIATION DOSE REDUCTION: This exam was performed according to the departmental dose-optimization program which includes automated exposure control, adjustment of the mA and/or kV according to patient size and/or use of iterative reconstruction technique. COMPARISON:  None Available. FINDINGS: Lower chest: Dependent atelectasis. Stent in the left anterior descending coronary artery. No acute abnormality. Hepatobiliary: No focal liver abnormality is seen. No gallstones, gallbladder wall thickening, or biliary dilatation. Pancreas: Unremarkable. No pancreatic ductal dilatation or surrounding inflammatory changes. Spleen: Normal in size without focal abnormality. Adrenals/Urinary Tract: Normal adrenal glands. No hydronephrosis, nephrolithiasis or solid renal mass. Water attenuation cystic lesions exophytic from the right kidney are incompletely evaluated in the absence of intravenous contrast but are highly likely benign cysts. Stomach/Bowel: Normal gastric anatomy. No significant colonic interposition. Weighted tip enteric feeding tube present within the proximal duodenum. Vascular/Lymphatic: No evidence of aneurysm. Scattered calcified atherosclerotic plaque. No suspicious lymphadenopathy. Other: No abdominal wall hernia or abnormality. Musculoskeletal: No acute fracture or aggressive appearing lytic or blastic osseous lesion. L5-S1 degenerative disc disease. IMPRESSION: 1. Gastric  anatomy is suitable for percutaneous gastrostomy tube placement. 2. The tip of the transpyloric feeding tube is in the descending duodenum. 3.  Aortic Atherosclerosis (ICD10-I70.0). 4. L5-S1 degenerative disc disease. Electronically Signed  By: Fernando Hoyer M.D.   On: 09/15/2023 11:10   DG CHEST PORT 1 VIEW Result Date: 09/04/2023 CLINICAL DATA:  Congestion upper respiratory tract EXAM: PORTABLE CHEST 1 VIEW COMPARISON:  Chest x-ray 08/28/2023 FINDINGS: The heart is enlarged, unchanged. Enteric tube tip is in the body of the stomach. There is minimal bibasilar atelectasis. There is no pleural effusion or pneumothorax. No acute fractures are seen. IMPRESSION: 1. Minimal bibasilar atelectasis. 2. Cardiomegaly. Electronically Signed   By: Tyron Gallon M.D.   On: 09/04/2023 14:44   Overnight EEG with video Result Date: 08/29/2023 Arleene Lack, MD     09/03/2023 10:13 AM Patient Name: Christopher Murphy MRN: [id number] Epilepsy Attending: Arleene Lack Referring Physician/Provider: Ronnette Coke, MD Duration: 08/28/2023 2022 to 08/29/2023 2022 Patient history:  78 y.o. male with hx of dementia, prior CVA with right-sided deficits who was brought to the emergency department for aphasia since 5/19 PM. EEG to evaluate for seizure Level of alertness: Awake, asleep AEDs during EEG study: LEV, PHT Technical aspects: This EEG study was done with scalp electrodes positioned according to the 10-20 International system of electrode placement. Electrical activity was reviewed with band pass filter of 1-70Hz , sensitivity of 7 uV/mm, display speed of 53mm/sec with a 60Hz  notched filter applied as appropriate. EEG data were recorded continuously and digitally stored.  Video monitoring was available and reviewed as appropriate. Description: The posterior dominant rhythm consists of 8 Hz activity of moderate voltage (25-35 uV) seen predominantly in posterior head regions, symmetric and reactive to eye opening and  eye closing. Sleep was characterized by vertex waves, sleep spindles (12 to 14 Hz), maximal frontocentral region. EEG showed continuous 3 to 6 Hz theta-delta slowing in right fronto-temporal region. Intermittent generalized 3 to 6 Hz theta-delta slowing was also noted. Hyperventilation and photic stimulation were not performed.  Throughout the study,  eeg showed 7-8hz  alpha activity in left posterior quadrant as well as 3-5hz  high amplitude sharply contoured theta-delta slowing involving all of left hemisphere. No clinical signs were noted.  Patient was given Keppra  1000 mg on 08/29/2023 at around 0922 without significant change in EEG pattern.  He was then given fosphenytoin  500 mg on 08/29/2023 at around 1823.  Subsequently this EEG pattern resolved .  This EEG pattern was most likely consistent with electrographic status epilepticus arising from left posterior quadrant ABNORMALITY - Focal electrographic status epilepticus, left posterior quadrant - Continuous slow, right hemisphere - Intermittent slow, generalized IMPRESSION: This study showed focal electrographic status epilepticus arising from left posterior quadrant which resolved after around 1845 on 08/29/2023. Additionally, EEG was suggestive of cortical dysfunction arising from right hemisphere likely secondary to underlying structural abnormality, post-ictal state.  Lastly there was mild diffuse encephalopathy. Arleene Lack   DG CHEST PORT 1 VIEW Result Date: 08/28/2023 CLINICAL DATA:  [id number] Fever [id number] EXAM: PORTABLE CHEST 1 VIEW COMPARISON:  Chest x-ray 08/27/2023 12:59 p.m. FINDINGS: The heart and mediastinal contours are within normal limits. Elevated left hemidiaphragm. No focal consolidation. No pulmonary edema. No pleural effusion. No pneumothorax. No acute osseous abnormality. IMPRESSION: No active disease. Electronically Signed   By: Morgane  Naveau M.D.   On: 08/28/2023 21:52   EEG adult Result Date: 08/28/2023 Eleni Griffin, MD      08/28/2023  2:25 PM Routine EEG Report Rmani Thunder [place] is a 78 y.o. male with a history of altered mental status who is undergoing an EEG to evaluate for seizures. Report: This EEG was acquired  with electrodes placed according to the International 10-20 electrode system (including Fp1, Fp2, F3, F4, C3, C4, P3, P4, O1, O2, T3, T4, T5, T6, A1, A2, Fz, Cz, Pz). The following electrodes were missing or displaced: none. The occipital dominant rhythm was 7 Hz. This activity is reactive to stimulation. Drowsiness was manifested by background fragmentation; deeper stages of sleep were identified by K complexes and sleep spindles. There was focal slowing over the right hemisphere. There were no interictal epileptiform discharges. There were no electrographic seizures identified. Photic stimulation and hyperventilation were not performed. Impression and clinical correlation: This EEG was obtained while awake and asleep and is abnormal due to: - mild diffuse slowing indicative of global cerebral dysfunction - superimposed R focal slowing indicating focal cerebral dysfunction in the right hemisphere Epileptiform abnormalities were not seen during this recording. Greg Leaks, MD [facility] [phone] If 7pm- 7am, please page neurology on call as listed in AMION.   ECHOCARDIOGRAM COMPLETE Result Date: 08/28/2023    ECHOCARDIOGRAM REPORT   Patient Name:   [facility] Date of Exam: 08/28/2023 Medical Rec #:  [id number]             Height:       70.0 in Accession #:    [id number]            Weight:       150.1 lb Date of Birth:  02/13/1946             BSA:          1.848 m Patient Age:    77 years              BP:           146/84 mmHg Patient Gender: M                     HR:           75 bpm. Exam Location:  Inpatient Procedure: 2D Echo (Both Spectral and Color Flow Doppler were utilized during            procedure). Indications:     stroke  History:         Patient has prior history of  Echocardiogram examinations, most                  recent 03/11/2022. Signs/Symptoms:Altered Mental Status.  Sonographer:     Dione Franks RDCS Referring Phys:  [id number] Toney Francois [facility] Diagnosing Phys: Belva Boyden MD  Sonographer Comments: Image acquisition challenging due to uncooperative patient. IMPRESSIONS  1. Left ventricular ejection fraction, by estimation, is 60 to 65%. Left ventricular ejection fraction by PLAX is 61 %. The left ventricle has normal function. The left ventricle has no regional wall motion abnormalities. Left ventricular diastolic parameters are consistent with Grade I diastolic dysfunction (impaired relaxation).  2. Right ventricular systolic function is normal. The right ventricular size is normal. There is normal pulmonary artery systolic pressure. The estimated right ventricular systolic pressure is 27.8 mmHg.  3. The mitral valve is normal in structure. No evidence of mitral valve regurgitation. No evidence of mitral stenosis.  4. The aortic valve is normal in structure. Aortic valve regurgitation is not visualized. No aortic stenosis is present.  5. The inferior vena cava is normal in size with greater than 50% respiratory variability, suggesting right atrial pressure of 3 mmHg. FINDINGS  Left Ventricle: Left ventricular ejection fraction, by estimation, is 60 to 65%. Left  ventricular ejection fraction by PLAX is 61 %. The left ventricle has normal function. The left ventricle has no regional wall motion abnormalities. Strain was performed and the global longitudinal strain is indeterminate. The left ventricular internal cavity size was normal in size. There is no left ventricular hypertrophy. Left ventricular diastolic parameters are consistent with Grade I diastolic dysfunction  (impaired relaxation). Right Ventricle: The right ventricular size is normal. No increase in right ventricular wall thickness. Right ventricular systolic function is normal. There is normal pulmonary  artery systolic pressure. The tricuspid regurgitant velocity is 2.49 m/s, and  with an assumed right atrial pressure of 3 mmHg, the estimated right ventricular systolic pressure is 27.8 mmHg. Left Atrium: Left atrial size was normal in size. Right Atrium: Right atrial size was normal in size. Pericardium: There is no evidence of pericardial effusion. Mitral Valve: The mitral valve is normal in structure. No evidence of mitral valve regurgitation. No evidence of mitral valve stenosis. Tricuspid Valve: The tricuspid valve is normal in structure. Tricuspid valve regurgitation is not demonstrated. No evidence of tricuspid stenosis. Aortic Valve: The aortic valve is normal in structure. Aortic valve regurgitation is not visualized. Aortic regurgitation PHT measures 606 msec. No aortic stenosis is present. Pulmonic Valve: The pulmonic valve was normal in structure. Pulmonic valve regurgitation is not visualized. No evidence of pulmonic stenosis. Aorta: The aortic root is normal in size and structure. Venous: The inferior vena cava is normal in size with greater than 50% respiratory variability, suggesting right atrial pressure of 3 mmHg. IAS/Shunts: No atrial level shunt detected by color flow Doppler. Additional Comments: 3D was performed not requiring image post processing on an independent workstation and was indeterminate.  LEFT VENTRICLE PLAX 2D LV EF:         Left            Diastology                ventricular     LV e' medial:    9.14 cm/s                ejection        LV E/e' medial:  8.1                fraction by     LV e' lateral:   11.10 cm/s                PLAX is 61      LV E/e' lateral: 6.7                %. LVIDd:         4.00 cm LVIDs:         2.70 cm LV PW:         0.90 cm LV IVS:        1.00 cm LVOT diam:     2.30 cm LV SV:         91 LV SV Index:   49 LVOT Area:     4.15 cm  RIGHT VENTRICLE             IVC RV Basal diam:  2.50 cm     IVC diam: 0.80 cm RV S prime:     18.10 cm/s TAPSE (M-mode): 2.4  cm LEFT ATRIUM             Index        RIGHT ATRIUM  Index LA Vol (A2C):   35.9 ml 19.43 ml/m  RA Area:     15.10 cm LA Vol (A4C):   39.8 ml 21.54 ml/m  RA Volume:   36.50 ml  19.75 ml/m LA Biplane Vol: 37.6 ml 20.35 ml/m  AORTIC VALVE LVOT Vmax:   115.00 cm/s LVOT Vmean:  78.350 cm/s LVOT VTI:    0.219 m AI PHT:      606 msec  AORTA Ao Root diam: 3.10 cm Ao Asc diam:  3.20 cm MITRAL VALVE               TRICUSPID VALVE MV Area (PHT): 3.31 cm    TR Peak grad:   24.8 mmHg MV Decel Time: 229 msec    TR Vmax:        249.00 cm/s MV E velocity: 73.90 cm/s MV A velocity: 74.90 cm/s  SHUNTS MV E/A ratio:  0.99        Systemic VTI:  0.22 m                            Systemic Diam: 2.30 cm Belva Boyden MD Electronically signed by Belva Boyden MD Signature Date/Time: 08/28/2023/11:40:16 AM    Final    MR BRAIN WO CONTRAST Result Date: 08/27/2023 CLINICAL DATA:  Initial evaluation for acute neuro deficit, stroke suspected. EXAM: MRI HEAD WITHOUT CONTRAST TECHNIQUE: Multiplanar, multiecho pulse sequences of the brain and surrounding structures were obtained without intravenous contrast. COMPARISON:  CT from earlier the same day as well as previous exams. FINDINGS: Brain: Diffuse prominence of the CSF containing spaces compatible generalized cerebral atrophy, advanced in nature. Extensive confluent T2/FLAIR hyperintensity involving the periventricular, deep, and subcortical white matter of both cerebral hemispheres as well as the pons and cerebellum, most likely related to severe chronic microvascular ischemic disease, and similar to prior. Encephalomalacia and gliosis involving the left occipital lobe consistent with a chronic left PCA distribution infarct. Involvement of the left thalamus noted. Evidence for wallerian degeneration at the left cerebral peduncle. Irregular diffusion signal abnormality involving the subcortical aspects of the right greater than left cerebral hemispheres again seen. Overall  appearance is somewhat unusual in appearance, but relatively stable as compared to multiple previous exams. Finding is of uncertain etiology, and could be related to underlying severe white matter disease with T2 shine through. Possible changes of seizure could also be considered. No convincing new foci of diffusion signal abnormality to suggest acute or subacute infarct. No acute or chronic intracranial blood products. No mass lesion, midline shift or mass effect. Diffuse ventricular prominence related to global parenchymal volume loss without hydrocephalus. No extra-axial fluid collection. Pituitary gland within normal limits. Vascular: Intracranial arterial circulation is somewhat dolichoectatic in appearance. Loss of normal flow void within the intradural right V4 segment, corresponding with abnormality on prior CTA. Major intracranial vascular flow voids are otherwise maintained. Skull and upper cervical spine: Craniocervical junction within normal limits. Bone marrow signal intensity normal. No scalp soft tissue abnormality. Sinuses/Orbits: Right gaze preference noted. Prior bilateral ocular lens replacement. Paranasal sinuses are largely clear. Trace right mastoid effusion, of doubtful significance. Other: None. IMPRESSION: 1. Advanced cerebral atrophy with severe white matter disease, nonspecific. While these findings could be related to severe chronic microvascular ischemic disease, the degree of white matter changes are markedly advanced, with a possible adult onset leukodystrophy/leukoencephalopathy also a consideration. 2. Irregular diffusion signal abnormality involving the subcortical aspects of the right greater than left  cerebral hemispheres, similar to previous exams. Finding is of uncertain etiology, and could be related to the underlying severe chronic white matter disease. Possible superimposed changes of acute/recent seizure could also be considered. 3. No other acute intracranial abnormality.  4. Chronic left PCA distribution infarct. Electronically Signed   By: Virgia Griffins M.D.   On: 08/27/2023 19:40   DG Chest Portable 1 View Result Date: 08/27/2023 CLINICAL DATA:  Altered mental status. Recent cough. Evaluate for pneumonia. EXAM: PORTABLE CHEST 1 VIEW COMPARISON:  06/18/2022 FINDINGS: Both lungs are clear. Slightly decreased lung volumes. Heart and mediastinum are within normal limits. Trachea is midline. Negative for a pneumothorax. No acute bone abnormality. IMPRESSION: No active disease. Electronically Signed   By: Elene Griffes M.D.   On: 08/27/2023 15:21   CT HEAD WO CONTRAST Result Date: 08/27/2023 CLINICAL DATA:  Provided history: Possible stroke, at of window. EXAM: CT HEAD WITHOUT CONTRAST TECHNIQUE: Contiguous axial images were obtained from the base of the skull through the vertex without intravenous contrast. RADIATION DOSE REDUCTION: This exam was performed according to the departmental dose-optimization program which includes automated exposure control, adjustment of the mA and/or kV according to patient size and/or use of iterative reconstruction technique. COMPARISON:  Head CT 06/18/2022. FINDINGS: Brain: Advanced generalized cerebral atrophy. Chronic cortical/subcortical infarct again demonstrated within the left occipital lobe. Chronic infarct again demonstrated within the left thalamus. Background advanced patchy and confluent hypoattenuation within the cerebral white matter, nonspecific but compatible with chronic small vessel ischemic disease. There is no acute intracranial hemorrhage. No acute demarcated cortical infarct. No extra-axial fluid collection. No evidence of an intracranial mass. No midline shift. Vascular: No hyperdense vessel. Atherosclerotic calcifications. Vertebrobasilar dolichoectasia. Skull: No calvarial fracture or aggressive osseous lesion. Sinuses/Orbits: No mass or acute finding within the imaged orbits. Opacification of a few left ethmoid air  cells. No acute intracranial finding. These results were communicated to Dr. Doretta Gant At 2:05 pmon 5/20/2025by text page via the [facility] messaging system. IMPRESSION: 1.  No acute intracranial finding. 2. Chronic cortical/subcortical infarct within the left occipital lobe (PCA vascular territory), unchanged. 3. Chronic infarct within the left thalamus, unchanged. 4. Background advanced cerebral atrophy and advanced cerebral white matter chronic small vessel ischemic disease. 5. Mild left ethmoid sinusitis. Electronically Signed   By: Bascom Lily D.O.   On: 08/27/2023 14:06   CT ANGIO HEAD NECK W WO CM W PERF (CODE STROKE) Result Date: 08/27/2023 CLINICAL DATA:  Neuro deficit, concern for stroke, confused, nonverbal. History of dementia. EXAM: CT ANGIOGRAPHY HEAD AND NECK CT PERFUSION BRAIN TECHNIQUE: Multidetector CT imaging of the head and neck was performed using the standard protocol during bolus administration of intravenous contrast. Multiplanar CT image reconstructions and MIPs were obtained to evaluate the vascular anatomy. Carotid stenosis measurements (when applicable) are obtained utilizing NASCET criteria, using the distal internal carotid diameter as the denominator. Multiphase CT imaging of the brain was performed following IV bolus contrast injection. Subsequent parametric perfusion maps were calculated using RAPID software. RADIATION DOSE REDUCTION: This exam was performed according to the departmental dose-optimization program which includes automated exposure control, adjustment of the mA and/or kV according to patient size and/or use of iterative reconstruction technique. CONTRAST:  75mL OMNIPAQUE  IOHEXOL  350 MG/ML SOLN COMPARISON:  Same day head CT.  CTA head and neck 03/11/2022. FINDINGS: CTA NECK FINDINGS Aortic arch: Standard configuration of the aortic arch. Imaged portion shows no evidence of aneurysm or dissection. No significant stenosis of the major arch vessel origins. Pulmonary  arteries:  As permitted by contrast timing, there are no filling defects in the visualized pulmonary arteries. Subclavian arteries: The subclavian arteries are patent bilaterally. Right carotid system: No evidence of dissection, stenosis (50% or greater), or occlusion. Left carotid system: No evidence of dissection, stenosis (50% or greater), or occlusion. Vertebral arteries: The left vertebral artery is dominant. Left vertebral artery is patent from the origin to the vertebrobasilar confluence. Slightly limited evaluation of the distal left V1 segment due to adjacent dense venous contrast. The non dominant right vertebral artery is patent from the origin to the vertebrobasilar confluence. There is a focus of irregularity of the vessel along the distal V3 and proximal V4 segment with a possible intraluminal soft tissue flap noted within the proximal V4 segment. There is significantly limited contrast within the right vertebral artery from the distal V3 segment to the vertebrobasilar confluence. Skeleton: No acute or aggressive finding noted. Other neck: The visualized airway is patent. No cervical lymphadenopathy. Upper chest: Visualized lung apices are clear. Review of the MIP images confirms the above findings CTA HEAD FINDINGS ANTERIOR CIRCULATION: The intracranial ICAs are patent bilaterally. Minimal atherosclerosis along the left carotid siphon without stenosis. No significant stenosis, proximal occlusion, aneurysm, or vascular malformation. MCAs: Patent bilaterally. Mild narrowing of a distal P2/proximal P3 segment of the right PCA. ACAs: The anterior cerebral arteries are patent bilaterally. 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 The left PCA is patent with diminished intraluminal contrast noted. The P1 and proximal P2 segments are normal in caliber. Distal branches are smaller in caliber without focal occlusion identified. Pcomm: Not well visualized. SCAs: The superior cerebellar  arteries are patent bilaterally. Basilar artery: The basilar artery is patent. Diminished intraluminal contrast within the basilar artery compared to the anterior circulation. AICAs: Not well visualized. PICAs: Visualized bilaterally. The left PICA is patent and demonstrates normal intraluminal contrast. Diminutive caliber of the left PICA with diminished intraluminal contrast although this appearance is similar to 2023. Vertebral arteries: As above. Venous sinuses: As permitted by contrast timing, patent. Anatomic variants: None Review of the MIP images confirms the above findings CT Brain Perfusion Findings: CBF (<30%) Volume: 0mL Perfusion (Tmax>6.0s) volume: Mismatch Volume: Infarction Location:No core infarct identified. Regions of elevated T-max within the cerebellum, primarily on the right. Additional elevated T-max within the right occipital lobe and posterior right temporal lobe. 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 Abnormal intraluminal soft tissue in the proximal V4 segment concerning for dissection flap. Nonocclusive thrombus is less likely. There is no evidence of occlusive thrombus. Additional diminished contrast within the basilar artery and right posterior cerebral artery. Diminished caliber of distal right PCA branches without focal occlusion identified, overall similar in caliber to 2023. No core infarct identified on CT perfusion. Areas of elevated T-max in the cerebellum and right occipital lobe concerning for hypoperfusion. Diminished caliber of the right PCA appears similar to prior. Normal caliber of vasculature in the anterior circulation without occlusion, high-grade stenosis, dissection, or aneurysm. These results were called by telephone at the time of interpretation on 08/27/2023 at 1:14 pm to provider Dr.  Doretta Gant, who verbally acknowledged these results. Electronically Signed   By: Denny Flack M.D.   On: 08/27/2023 13:34    Microbiology: No results found for this or any previous visit (from the past 240 hours).    Signed:  Ephriam Hashimoto, MD 10/05/2023

## 2023-10-08 NOTE — Significant Event (Signed)
 CODE STATUS confirmed with patient's daughter Ms. Deborra Falter.  DNR comfort measures.  Christopher Murphy

## 2023-10-08 NOTE — Progress Notes (Signed)
 Staff called to bs via family to check on patient. Pt found to be pulseless and apneic. TOD 0416 and verified via two RN's. Hospitalist notified.

## 2023-10-08 DEATH — deceased

## 2024-09-02 ENCOUNTER — Ambulatory Visit: Admitting: Neurology
